# Patient Record
Sex: Male | Born: 1950 | Race: White | Hispanic: No | Smoking: Never smoker
Health system: Southern US, Community
[De-identification: ages and names within clinical notes are randomized; demographics above are authoritative.]

## PROBLEM LIST (undated history)

## (undated) DIAGNOSIS — M1990 Unspecified osteoarthritis, unspecified site: Secondary | ICD-10-CM

## (undated) DIAGNOSIS — F329 Major depressive disorder, single episode, unspecified: Secondary | ICD-10-CM

## (undated) DIAGNOSIS — R519 Headache, unspecified: Secondary | ICD-10-CM

## (undated) DIAGNOSIS — F419 Anxiety disorder, unspecified: Secondary | ICD-10-CM

## (undated) DIAGNOSIS — I2510 Atherosclerotic heart disease of native coronary artery without angina pectoris: Secondary | ICD-10-CM

## (undated) DIAGNOSIS — E7800 Pure hypercholesterolemia, unspecified: Secondary | ICD-10-CM

## (undated) DIAGNOSIS — I4510 Unspecified right bundle-branch block: Secondary | ICD-10-CM

## (undated) DIAGNOSIS — IMO0001 Reserved for inherently not codable concepts without codable children: Secondary | ICD-10-CM

## (undated) DIAGNOSIS — F32A Depression, unspecified: Secondary | ICD-10-CM

## (undated) DIAGNOSIS — R51 Headache: Secondary | ICD-10-CM

## (undated) DIAGNOSIS — I209 Angina pectoris, unspecified: Secondary | ICD-10-CM

## (undated) DIAGNOSIS — I10 Essential (primary) hypertension: Secondary | ICD-10-CM

## (undated) DIAGNOSIS — K219 Gastro-esophageal reflux disease without esophagitis: Secondary | ICD-10-CM

## (undated) DIAGNOSIS — Z87442 Personal history of urinary calculi: Secondary | ICD-10-CM

## (undated) HISTORY — DX: Pure hypercholesterolemia, unspecified: E78.00

## (undated) HISTORY — DX: Atherosclerotic heart disease of native coronary artery without angina pectoris: I25.10

## (undated) HISTORY — DX: Essential (primary) hypertension: I10

## (undated) HISTORY — PX: TOE SURGERY: SHX1073

## (undated) HISTORY — PX: CATARACT EXTRACTION, BILATERAL: SHX1313

## (undated) HISTORY — PX: LEG SURGERY: SHX1003

---

## 1985-11-21 HISTORY — PX: GASTRIC RESTRICTION SURGERY: SHX653

## 1985-11-21 HISTORY — PX: CHOLECYSTECTOMY: SHX55

## 1995-11-22 HISTORY — PX: BILE DUCT EXPLORATION: SHX1225

## 1996-11-21 DIAGNOSIS — I209 Angina pectoris, unspecified: Secondary | ICD-10-CM

## 1996-11-21 HISTORY — PX: CORONARY ARTERY BYPASS GRAFT: SHX141

## 1996-11-21 HISTORY — DX: Angina pectoris, unspecified: I20.9

## 1996-11-21 HISTORY — PX: CARDIAC CATHETERIZATION: SHX172

## 2004-04-14 ENCOUNTER — Ambulatory Visit (HOSPITAL_COMMUNITY): Admission: RE | Admit: 2004-04-14 | Discharge: 2004-04-14 | Payer: Self-pay | Admitting: Surgery

## 2004-06-03 ENCOUNTER — Encounter (INDEPENDENT_AMBULATORY_CARE_PROVIDER_SITE_OTHER): Payer: Self-pay | Admitting: Specialist

## 2004-06-03 ENCOUNTER — Ambulatory Visit (HOSPITAL_COMMUNITY): Admission: RE | Admit: 2004-06-03 | Discharge: 2004-06-03 | Payer: Self-pay | Admitting: Surgery

## 2004-07-29 ENCOUNTER — Encounter: Admission: RE | Admit: 2004-07-29 | Discharge: 2004-10-27 | Payer: Self-pay | Admitting: Surgery

## 2004-11-05 ENCOUNTER — Encounter: Admission: RE | Admit: 2004-11-05 | Discharge: 2005-02-03 | Payer: Self-pay | Admitting: Surgery

## 2004-11-21 HISTORY — PX: BARIATRIC SURGERY: SHX1103

## 2005-02-10 ENCOUNTER — Encounter: Admission: RE | Admit: 2005-02-10 | Discharge: 2005-05-11 | Payer: Self-pay | Admitting: Surgery

## 2005-02-22 ENCOUNTER — Inpatient Hospital Stay (HOSPITAL_COMMUNITY): Admission: RE | Admit: 2005-02-22 | Discharge: 2005-02-28 | Payer: Self-pay | Admitting: Surgery

## 2005-05-17 ENCOUNTER — Encounter: Admission: RE | Admit: 2005-05-17 | Discharge: 2005-08-15 | Payer: Self-pay | Admitting: Surgery

## 2005-08-16 ENCOUNTER — Encounter: Admission: RE | Admit: 2005-08-16 | Discharge: 2005-11-14 | Payer: Self-pay | Admitting: Surgery

## 2005-11-21 HISTORY — PX: HERNIA REPAIR: SHX51

## 2006-02-22 ENCOUNTER — Encounter: Admission: RE | Admit: 2006-02-22 | Discharge: 2006-02-22 | Payer: Self-pay | Admitting: Surgery

## 2007-08-13 ENCOUNTER — Ambulatory Visit: Payer: Self-pay | Admitting: Cardiovascular Disease

## 2007-08-21 ENCOUNTER — Ambulatory Visit: Payer: Self-pay

## 2007-09-11 ENCOUNTER — Ambulatory Visit (HOSPITAL_COMMUNITY): Admission: RE | Admit: 2007-09-11 | Discharge: 2007-09-13 | Payer: Self-pay | Admitting: Surgery

## 2009-03-06 DIAGNOSIS — I498 Other specified cardiac arrhythmias: Secondary | ICD-10-CM | POA: Insufficient documentation

## 2009-03-06 DIAGNOSIS — I10 Essential (primary) hypertension: Secondary | ICD-10-CM | POA: Insufficient documentation

## 2009-03-06 DIAGNOSIS — E7800 Pure hypercholesterolemia, unspecified: Secondary | ICD-10-CM | POA: Insufficient documentation

## 2009-03-06 DIAGNOSIS — I2510 Atherosclerotic heart disease of native coronary artery without angina pectoris: Secondary | ICD-10-CM | POA: Insufficient documentation

## 2009-03-06 DIAGNOSIS — I4510 Unspecified right bundle-branch block: Secondary | ICD-10-CM | POA: Insufficient documentation

## 2009-03-09 ENCOUNTER — Encounter: Payer: Self-pay | Admitting: Cardiovascular Disease

## 2009-03-09 ENCOUNTER — Ambulatory Visit: Payer: Self-pay | Admitting: Cardiovascular Disease

## 2009-03-09 DIAGNOSIS — R609 Edema, unspecified: Secondary | ICD-10-CM | POA: Insufficient documentation

## 2009-09-18 ENCOUNTER — Ambulatory Visit: Payer: Self-pay | Admitting: Cardiovascular Disease

## 2009-10-12 ENCOUNTER — Encounter: Payer: Self-pay | Admitting: Cardiovascular Disease

## 2010-01-21 ENCOUNTER — Encounter (INDEPENDENT_AMBULATORY_CARE_PROVIDER_SITE_OTHER): Payer: Self-pay | Admitting: *Deleted

## 2010-02-19 ENCOUNTER — Ambulatory Visit: Payer: Self-pay | Admitting: Cardiovascular Disease

## 2010-09-03 ENCOUNTER — Ambulatory Visit: Payer: Self-pay | Admitting: Cardiovascular Disease

## 2010-11-19 ENCOUNTER — Telehealth: Payer: Self-pay | Admitting: Cardiovascular Disease

## 2010-11-21 HISTORY — PX: TOTAL KNEE ARTHROPLASTY: SHX125

## 2010-11-23 ENCOUNTER — Encounter: Payer: Self-pay | Admitting: Cardiovascular Disease

## 2010-12-06 ENCOUNTER — Inpatient Hospital Stay (HOSPITAL_COMMUNITY)
Admission: RE | Admit: 2010-12-06 | Discharge: 2010-12-08 | Payer: Self-pay | Source: Home / Self Care | Attending: Orthopedic Surgery | Admitting: Orthopedic Surgery

## 2010-12-06 LAB — CBC
HCT: 39.5 % (ref 39.0–52.0)
Hemoglobin: 12.6 g/dL — ABNORMAL LOW (ref 13.0–17.0)
MCH: 31 pg (ref 26.0–34.0)
MCHC: 31.9 g/dL (ref 30.0–36.0)
MCV: 97.1 fL (ref 78.0–100.0)
Platelets: 287 10*3/uL (ref 150–400)
RBC: 4.07 MIL/uL — ABNORMAL LOW (ref 4.22–5.81)
RDW: 12.4 % (ref 11.5–15.5)
WBC: 7.1 10*3/uL (ref 4.0–10.5)

## 2010-12-06 LAB — URINALYSIS, ROUTINE W REFLEX MICROSCOPIC
Hgb urine dipstick: NEGATIVE
Nitrite: NEGATIVE
Protein, ur: NEGATIVE mg/dL
Specific Gravity, Urine: 1.024 (ref 1.005–1.030)
Urine Glucose, Fasting: NEGATIVE mg/dL
Urobilinogen, UA: 1 mg/dL (ref 0.0–1.0)
pH: 5.5 (ref 5.0–8.0)

## 2010-12-06 LAB — BASIC METABOLIC PANEL
BUN: 15 mg/dL (ref 6–23)
CO2: 27 mEq/L (ref 19–32)
Calcium: 9.8 mg/dL (ref 8.4–10.5)
Chloride: 105 mEq/L (ref 96–112)
Creatinine, Ser: 0.89 mg/dL (ref 0.4–1.5)
GFR calc Af Amer: >60 mL/min (ref >60)
GFR calc non Af Amer: >60 mL/min (ref >60)
Glucose, Bld: 106 mg/dL — ABNORMAL HIGH (ref 70–99)
Potassium: 4.2 mEq/L (ref 3.5–5.1)
Sodium: 142 mEq/L (ref 135–145)

## 2010-12-06 LAB — SURGICAL PCR SCREEN
MRSA, PCR: NEGATIVE
Staphylococcus aureus: POSITIVE — AB

## 2010-12-06 LAB — DIFFERENTIAL
Basophils Absolute: 0 10*3/uL (ref 0.0–0.1)
Basophils Relative: 0 % (ref 0–1)
Eosinophils Absolute: 0.1 10*3/uL (ref 0.0–0.7)
Eosinophils Relative: 1 % (ref 0–5)
Lymphocytes Relative: 18 % (ref 12–46)
Lymphs Abs: 1.3 10*3/uL (ref 0.7–4.0)
Monocytes Absolute: 0.5 10*3/uL (ref 0.1–1.0)
Monocytes Relative: 7 % (ref 3–12)
Neutro Abs: 5.2 10*3/uL (ref 1.7–7.7)
Neutrophils Relative %: 73 % (ref 43–77)

## 2010-12-06 LAB — APTT: aPTT: 35 seconds (ref 24–37)

## 2010-12-06 LAB — PROTIME-INR
INR: 1.1 (ref 0.00–1.49)
Prothrombin Time: 14.4 seconds (ref 11.6–15.2)

## 2010-12-08 LAB — TYPE AND SCREEN
ABO/RH(D): A POS
Antibody Screen: NEGATIVE

## 2010-12-08 LAB — BASIC METABOLIC PANEL
BUN: 11 mg/dL (ref 6–23)
CO2: 30 mEq/L (ref 19–32)
Calcium: 8.4 mg/dL (ref 8.4–10.5)
Chloride: 102 mEq/L (ref 96–112)
Creatinine, Ser: 0.7 mg/dL (ref 0.4–1.5)
GFR calc Af Amer: >60 mL/min (ref >60)
GFR calc non Af Amer: >60 mL/min (ref >60)
Glucose, Bld: 118 mg/dL — ABNORMAL HIGH (ref 70–99)
Potassium: 4.3 mEq/L (ref 3.5–5.1)
Sodium: 137 mEq/L (ref 135–145)

## 2010-12-08 LAB — CBC
HCT: 31.3 % — ABNORMAL LOW (ref 39.0–52.0)
Hemoglobin: 9.8 g/dL — ABNORMAL LOW (ref 13.0–17.0)
MCH: 30.5 pg (ref 26.0–34.0)
MCHC: 31.3 g/dL (ref 30.0–36.0)
MCV: 97.5 fL (ref 78.0–100.0)
Platelets: 221 10*3/uL (ref 150–400)
RBC: 3.21 MIL/uL — ABNORMAL LOW (ref 4.22–5.81)
RDW: 12.4 % (ref 11.5–15.5)
WBC: 7.2 10*3/uL (ref 4.0–10.5)

## 2010-12-08 LAB — ABO/RH: ABO/RH(D): A POS

## 2010-12-13 LAB — CBC
HCT: 30.9 % — ABNORMAL LOW (ref 39.0–52.0)
Hemoglobin: 10.1 g/dL — ABNORMAL LOW (ref 13.0–17.0)
MCH: 31 pg (ref 26.0–34.0)
MCHC: 32.7 g/dL (ref 30.0–36.0)
MCV: 94.8 fL (ref 78.0–100.0)
Platelets: 224 10*3/uL (ref 150–400)
RBC: 3.26 MIL/uL — ABNORMAL LOW (ref 4.22–5.81)
RDW: 12.1 % (ref 11.5–15.5)
WBC: 10.3 10*3/uL (ref 4.0–10.5)

## 2010-12-13 LAB — BASIC METABOLIC PANEL
BUN: 12 mg/dL (ref 6–23)
CO2: 28 mEq/L (ref 19–32)
Calcium: 8.4 mg/dL (ref 8.4–10.5)
Chloride: 103 mEq/L (ref 96–112)
Creatinine, Ser: 0.81 mg/dL (ref 0.4–1.5)
GFR calc Af Amer: >60 mL/min (ref >60)
GFR calc non Af Amer: >60 mL/min (ref >60)
Glucose, Bld: 118 mg/dL — ABNORMAL HIGH (ref 70–99)
Potassium: 4.4 mEq/L (ref 3.5–5.1)
Sodium: 136 mEq/L (ref 135–145)
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
## 2010-12-21 NOTE — Letter (Signed)
Summary: Appointment - Reminder 2  [employer], Main Office  [address]   [place], [place] [address]   Phone: [phone]  Fax: [phone]     January 21, 2010 MRN: [id number]   [facility] [address] [place], [place]  [address]   Dear Mr. Bhandari,  Our records indicate that it is time to schedule a follow-up appointment with Dr. Eden Emms. It is very important that we reach you to schedule this appointment. We look forward to participating in your health care needs. Please contact us at the number listed above at your earliest convenience to schedule your appointment.  If you are unable to make an appointment at this time, give [place] a call so we can update our records.     Sincerely,   [facility] D[facility] Scheduling Team

## 2010-12-21 NOTE — Assessment & Plan Note (Signed)
Summary: F6M/DM   History of Present Illness: Jeremiah [name] is seen today for f/U CAD with CABG by Dr. Laneta Simmers in 1998  He had a Lima LAD, SVG D1, SVG OM1 and OM2, SVG PDA/PLA and distal circ.  EF is normal  He is also post gastric bypass surgery.  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  He is not having any SSCP, dyspnea or palpitations.  He has mild chronic edema.  He has been compliant with his meds and has an LDL under 100.  The cooler weather is bothering his arthritis but other than that he is doing well.   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 Take 1 By Mouth Two Times A Day 2)  Labetalol Hcl 200 Mg Tabs (Labetalol Hcl) .... Take 1 By Mouth Two Times A Day 3)  Simvastatin 40 Mg Tabs (Simvastatin) .... Take 1 By Mouth Once Daily 4)  Lisinopril 10 Mg Tabs (Lisinopril) .... Take 1 By Mouth Once Daily 5)  Fluoxetine Hcl 40 Mg Caps (Fluoxetine Hcl) .... Take 1 By Mouth Once Daily 6)  Bayer Aspirin 325 Mg Tabs (Aspirin) .... Take 1 Once Daily 7)  Clonidine Hcl 0.1 Mg Tabs (Clonidine Hcl) .... Take 1 By Mouth Once Daily 8)  Zolpidem Tartrate 10 Mg Tabs (Zolpidem Tartrate) .... Take 1 By Mouth At Bedtime 9)  Multivitamins  Tabs (Multiple Vitamin) .... Take 1 Once Daily 10)  Ferrous Sulfate 325 (65 Fe) Mg  Tabs (Ferrous Sulfate) .... Once Daily 11)  Vitamin B-12 500 Mcg  Tabs (Cyanocobalamin) .... Once Daily 12)  Calcium Antacid Ultra Max St 1000 Mg Chew (Calcium Carbonate Antacid) .[name].. 11 Tab By Mouth Once Daily 13)  Meloxicam 15 Mg Tabs (Meloxicam) .[name].. 1  Tab Po Once Daily  Allergies (verified): No Known Drug Allergies  Past History:  Past Medical History: Last updated: 03/06/2009 Current Problems:  BUNDLE BRANCH BLOCK, RIGHT (ICD-426.4) BRADYCARDIA (ICD-427.89) CAD (ICD-414.00) HYPERTENSION (ICD-401.9) HYPERCHOLESTEROLEMIA (ICD-272.0)   orthopedic issues. gout Currently stable  Past Surgical History: Last updated: 03/06/2009 upper hernia and a laparoscopic internal repair of the entire area.  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 Criss Alvine. bile duct stone in 1997, left knee arthroscopy adhesive capsulitis of the left shoulder with surgery by Dr. Margaree Mackintosh in 2005. bariatric surgery   Family History: Last updated: 03/06/2009  Non-contributory.      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  Alcohol Use - no  Review of Systems       Denies fever, malais, weight loss, blurry vision, decreased visual acuity, cough, sputum, SOB, hemoptysis, pleuritic pain, palpitaitons, heartburn, abdominal pain, melena, lower extremity edema, claudication, or rash.   Vital Signs:  Patient profile:   60 year old male Height:      69 inches Weight:      265 pounds BMI:     39.28 Pulse rate:  58 / minute Resp:     18 per minute BP sitting:   114 / 60  (left arm)  Vitals Entered By: Marrion Coy, CNA (September 03, 2010 8:58 AM)  Physical Exam  General:  Affect appropriate Healthy:  appears stated age HEENT: normal Neck supple with no adenopathy JVP normal no bruits no thyromegaly Lungs clear with no wheezing and good diaphragmatic motion Heart:  S1/S2 no murmur,rub, gallop or click PMI normal Abdomen: benighn, BS positve, no tenderness, no AAA no bruit.  No HSM or HJR Distal pulses intact with no bruits No edema Neuro non-focal Skin warm and  dry    Impression & Recommendations:  Problem # 1:  BUNDLE BRANCH BLOCK, RIGHT (ICD-426.4) No evidence of wenkebach or below his block.  Consider lowering BB dose in future.  ECG q 6 months His updated medication list for this problem includes:    Labetalol Hcl 200 Mg Tabs (Labetalol hcl) .[name]... Take 1 by mouth two times a day    Lisinopril 10 Mg Tabs (Lisinopril) .[name]... Take 1 by mouth once daily    Bayer Aspirin 325 Mg Tabs (Aspirin) .[name]... Take 1 once daily  Problem # 2:  CAD (ICD-414.00) Stable no angina His updated medication list for this problem includes:    Labetalol Hcl 200 Mg Tabs (Labetalol hcl) .[name]... Take 1 by mouth two times a day    Lisinopril 10 Mg Tabs (Lisinopril) .[name]... Take 1 by mouth once daily    Bayer Aspirin 325 Mg Tabs (Aspirin) .[name]... Take 1 once daily  Orders: EKG w/ Interpretation (93000)  Problem # 3:  HYPERTENSION (ICD-401.9) Well controlled His updated medication list for this problem includes:    Labetalol Hcl 200 Mg Tabs (Labetalol hcl) .[name]... Take 1 by mouth two times a day    Lisinopril 10 Mg Tabs (Lisinopril) .[name]... Take 1 by mouth once daily    Bayer Aspirin 325 Mg Tabs (Aspirin) .[name]... Take 1 once daily    Clonidine Hcl 0.1 Mg Tabs (Clonidine hcl) .[name]... Take 1 by mouth once daily  Problem # 4:  HYPERCHOLESTEROLEMIA (ICD-272.0) Labs per primary next month.  Cotninue statin His updated medication list for this problem includes:    Simvastatin 40 Mg Tabs (Simvastatin) .[name]... Take 1 by mouth once daily  Patient Instructions: 1)  Your physician recommends that you schedule a follow-up appointment in: 6 MONTHS WITH DR Eden Emms 2)  Your physician recommends that you continue on your current medications as directed. Please refer to the Current Medication list given to you today.   EKG Report  Procedure date:  09/03/2010  Findings:      Sinus 58 PR 210 RBBB LAD Abnormla ECG

## 2010-12-21 NOTE — Assessment & Plan Note (Signed)
Summary: F6M/DM   CC:  no complaints.  History of Present Illness: Jeremiah [name] is seen today for f/U CAD with CABG by Dr. Laneta Simmers in 1998  He had a Lima LAD, SVG D1, SVG OM1 and OM2, SVG PDA/PLA and distal circ.  EF is normal  He is also post gastric bypass surgery.  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  He is not having any SSCP, dyspnea or palpitations.  He has mild chronic edema.  He has been compliant with his meds and has an LDL under 100 I reviewed labs from 11/10 at [facility] and he had normal LFT;s and LDL of 80.  The cooler weather is bothering his arthritis but other than that he is doing well.  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 Take 1 By Mouth Two Times A Day 2)  Labetalol Hcl 200 Mg Tabs (Labetalol Hcl) .... Take 1 By Mouth Two Times A Day 3)  Simvastatin 40 Mg Tabs (Simvastatin) .... Take 1 By Mouth Once Daily 4)  Lisinopril 10 Mg Tabs (Lisinopril) .... Take 1 By Mouth Once Daily 5)  Fluoxetine Hcl 40 Mg Caps (Fluoxetine Hcl) .... Take 1 By Mouth Once Daily 6)  Bayer Aspirin 325 Mg Tabs (Aspirin) .... Take 1 Once Daily 7)  Clonidine Hcl 0.1 Mg Tabs (Clonidine Hcl) .... Take 1 By Mouth Once Daily 8)  Zolpidem Tartrate 10 Mg Tabs (Zolpidem Tartrate) .... Take 1 By Mouth At Bedtime 9)  Multivitamins  Tabs (Multiple Vitamin) .... Take 1 Once Daily 10)  Glucosamine-Chondroitin   Caps (Glucosamine-Chondroit-Vit C-Mn) .... Two Times A Day 11)  Ferrous Sulfate 325 (65 Fe) Mg  Tabs (Ferrous Sulfate) .... Once Daily 12)  Vitamin B-12 500 Mcg  Tabs (Cyanocobalamin) .... Once Daily 13)  Aleve 220 Mg Tabs (Naproxen Sodium) .... 2 Two Times A Day 14)  Calcium Antacid Ultra Max St 1000 Mg Chew  (Calcium Carbonate Antacid) .[name].. 11 Tab By Mouth Once Daily 15)  Meloxicam 15 Mg Tabs (Meloxicam) .[name].. 1 Tab Po Once Daily  Allergies (verified): No Known Drug Allergies  Past History:  Past Medical History: Last updated: 03/06/2009 Current Problems:  BUNDLE BRANCH BLOCK, RIGHT (ICD-426.4) BRADYCARDIA (ICD-427.89) CAD (ICD-414.00) HYPERTENSION (ICD-401.9) HYPERCHOLESTEROLEMIA (ICD-272.0)   orthopedic issues. gout Currently stable  Past Surgical History: Last updated: 03/06/2009 upper hernia and a laparoscopic internal repair of the entire area.  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 Criss Alvine. bile duct stone in 1997, left knee arthroscopy adhesive capsulitis of the left shoulder with surgery by Dr. Margaree Mackintosh in 2005. bariatric surgery   Family History: Last updated: 03/06/2009  Non-contributory.      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  Alcohol Use - no  Review of Systems       Denies fever, malais, weight loss, blurry vision, decreased visual acuity, cough, sputum, SOB, hemoptysis, pleuritic pain, palpitaitons, heartburn, abdominal pain, melena, lower extremity edema, claudication, or rash.   Vital Signs:  Patient profile:   60 year old male Height:  69 inches Weight:      271 pounds BMI:     40.16 Pulse rate:   70 / minute Resp:     12 per minute BP sitting:   120 / 80  (left arm)  Vitals Entered By: Kem Parkinson (February 19, 2010 8:38 AM)  Physical Exam  General:  Affect appropriate Healthy:  appears stated age HEENT: normal Neck supple with no adenopathy JVP normal no bruits no thyromegaly Lungs clear with no wheezing and good diaphragmatic motion Heart:  S1/S2 no murmur,rub, gallop or click PMI normal Abdomen: benighn, BS positve, no tenderness, no AAA  S/P gastric stabling and gastric bypass surg no bruit.  No HSM or HJR Distal pulses intact with no bruits No edema Neuro non-focal Skin warm and dry    Impression & Recommendations:  Problem # 1:  CAD (ICD-414.00) Stable no angina.  Continue ASA and BB His updated medication list for this problem includes:    Labetalol Hcl 200 Mg Tabs (Labetalol hcl) .[name]... Take 1 by mouth two times a day    Lisinopril 10 Mg Tabs (Lisinopril) .[name]... Take 1 by mouth once daily    Bayer Aspirin 325 Mg Tabs (Aspirin) .[name]... Take 1 once daily  Problem # 2:  HYPERTENSION (ICD-401.9) Well contorlled continue low sodium diet His updated medication list for this problem includes:    Labetalol Hcl 200 Mg Tabs (Labetalol hcl) .[name]... Take 1 by mouth two times a day    Lisinopril 10 Mg Tabs (Lisinopril) .[name]... Take 1 by mouth once daily    Bayer Aspirin 325 Mg Tabs (Aspirin) .[name]... Take 1 once daily    Clonidine Hcl 0.1 Mg Tabs (Clonidine hcl) .[name]... Take 1 by mouth once daily  Problem # 3:  HYPERCHOLESTEROLEMIA (ICD-272.0) At goal with no side effects.  F/U labs [facility] His updated medication list for this problem includes:    Simvastatin 40 Mg Tabs (Simvastatin) .[name]... Take 1 by mouth once daily  Problem # 4:  BUNDLE BRANCH BLOCK, RIGHT (ICD-426.4) Baseline ECG RBBB stable with no evidence of high grade heart block His updated medication list for this problem includes:    Labetalol Hcl 200 Mg Tabs (Labetalol hcl) .[name]... Take 1 by mouth two times a day    Lisinopril 10 Mg Tabs (Lisinopril) .[name]... Take 1 by mouth once daily    Bayer Aspirin 325 Mg Tabs (Aspirin) .[name]... Take 1 once daily  Patient Instructions: 1)  Your physician recommends that you schedule a follow-up appointment in: 6 MONTHS

## 2010-12-23 NOTE — Progress Notes (Signed)
Summary: surgical clearance form  Phone Note Call from Patient Call back at Home Phone [phone]   Caller: Patient Reason for Call: Talk to Nurse Summary of Call: pt having knee surgery 12/06/09. pt states surgical clearnace form was fax. pt wants to know if debra can fill that out and fax it back for his surgery. Initial call taken by: Roe Coombs,  November 19, 2010 9:27 AM  Follow-up for Phone Call        Pt aware Dr. Eden Emms & Stanton Kidney are out of office at this time.  He would like surgical clearance faxed to Dr. Charlann Boxer at [facility].  He will be seeing Dr. Charlann Boxer on Friday 11/26/10.  I will forward this to Dr. Eden Emms and Veneda Melter RN     Appended Document: surgical clearance form clearence form faxed  Appended Document: surgical clearance form ok for surgery

## 2010-12-29 NOTE — Letter (Signed)
Summary: [facility] Surgical Clearance   [facility] Surgical Clearance   Imported By: Roderic Ovens 12/24/2010 12:32:21  _____________________________________________________________________  External Attachment:    Type:   Image     Comment:   External Document

## 2011-03-04 ENCOUNTER — Ambulatory Visit: Payer: Self-pay | Admitting: Cardiovascular Disease

## 2011-03-17 ENCOUNTER — Encounter: Payer: Self-pay | Admitting: Cardiovascular Disease

## 2011-03-18 ENCOUNTER — Encounter: Payer: Self-pay | Admitting: Cardiovascular Disease

## 2011-03-18 ENCOUNTER — Ambulatory Visit (INDEPENDENT_AMBULATORY_CARE_PROVIDER_SITE_OTHER): Payer: 59 | Admitting: Cardiovascular Disease

## 2011-03-18 DIAGNOSIS — Z136 Encounter for screening for cardiovascular disorders: Secondary | ICD-10-CM

## 2011-03-18 DIAGNOSIS — I2510 Atherosclerotic heart disease of native coronary artery without angina pectoris: Secondary | ICD-10-CM

## 2011-03-18 DIAGNOSIS — I714 Abdominal aortic aneurysm, without rupture, unspecified: Secondary | ICD-10-CM

## 2011-03-18 DIAGNOSIS — E7800 Pure hypercholesterolemia, unspecified: Secondary | ICD-10-CM

## 2011-03-18 DIAGNOSIS — I10 Essential (primary) hypertension: Secondary | ICD-10-CM

## 2011-03-18 DIAGNOSIS — I4510 Unspecified right bundle-branch block: Secondary | ICD-10-CM

## 2011-03-18 NOTE — Assessment & Plan Note (Signed)
Palpable abdominal aorta that appears enlarged.  F/U US/duplex
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
## 2011-03-18 NOTE — Assessment & Plan Note (Signed)
Well controlled.  Continue current medications and low sodium Dash type diet.    

## 2011-03-18 NOTE — Patient Instructions (Addendum)
1. Your physician recommends that you schedule a follow-up appointment in: 6 months with Dr. Eden Emms 2. Your physician has requested that you have an abdominal aorta duplex. During this test, an ultrasound is used to evaluate the aorta. Allow 30 minutes for this exam. Do not eat after midnight the day before and avoid carbonated beverages

## 2011-03-18 NOTE — Progress Notes (Signed)
Addended by: Lisabeth Devoid on: 03/18/2011 09:26 AM   Modules accepted: Orders

## 2011-03-18 NOTE — Assessment & Plan Note (Signed)
Stable with no angina and good activity level.  Continue medical Rx  

## 2011-03-18 NOTE — Assessment & Plan Note (Signed)
Cholesterol is at goal.  Continue current dose of statin and diet Rx.  No myalgias or side effects.  F/U  LFT's in 6 months. No results found for this basename: LDLCALC             

## 2011-03-18 NOTE — Assessment & Plan Note (Signed)
Continue low dose BB no syncope or AV block  ECG stable

## 2011-03-23 ENCOUNTER — Emergency Department (HOSPITAL_COMMUNITY)
Admission: EM | Admit: 2011-03-23 | Discharge: 2011-03-23 | Disposition: A | Discharge status: Home / Self Care | Payer: Worker's Compensation | Attending: Emergency Medicine | Admitting: Emergency Medicine

## 2011-03-23 DIAGNOSIS — Y92410 Unspecified street and highway as the place of occurrence of the external cause: Secondary | ICD-10-CM | POA: Insufficient documentation

## 2011-03-23 DIAGNOSIS — M25569 Pain in unspecified knee: Secondary | ICD-10-CM | POA: Insufficient documentation

## 2011-03-23 DIAGNOSIS — E7800 Pure hypercholesterolemia, unspecified: Secondary | ICD-10-CM | POA: Insufficient documentation

## 2011-03-23 DIAGNOSIS — M549 Dorsalgia, unspecified: Secondary | ICD-10-CM | POA: Insufficient documentation

## 2011-03-23 DIAGNOSIS — T1490XA Injury, unspecified, initial encounter: Secondary | ICD-10-CM | POA: Insufficient documentation

## 2011-03-23 DIAGNOSIS — Z9889 Other specified postprocedural states: Secondary | ICD-10-CM | POA: Insufficient documentation

## 2011-03-23 DIAGNOSIS — I10 Essential (primary) hypertension: Secondary | ICD-10-CM | POA: Insufficient documentation

## 2011-03-23 DIAGNOSIS — Z951 Presence of aortocoronary bypass graft: Secondary | ICD-10-CM | POA: Insufficient documentation

## 2011-03-24 ENCOUNTER — Other Ambulatory Visit: Payer: Self-pay | Admitting: Cardiology

## 2011-03-24 DIAGNOSIS — R0989 Other specified symptoms and signs involving the circulatory and respiratory systems: Secondary | ICD-10-CM

## 2011-03-25 ENCOUNTER — Ambulatory Visit: Payer: 59 | Admitting: Cardiology

## 2011-03-25 ENCOUNTER — Ambulatory Visit (INDEPENDENT_AMBULATORY_CARE_PROVIDER_SITE_OTHER): Payer: Self-pay | Admitting: Cardiology

## 2011-03-25 DIAGNOSIS — Z0000 Encounter for general adult medical examination without abnormal findings: Secondary | ICD-10-CM

## 2011-03-28 ENCOUNTER — Encounter: Payer: Self-pay | Admitting: Cardiovascular Disease

## 2011-04-05 NOTE — Op Note (Signed)
NAMEPRESTIN, Jeremiah [name]               ACCOUNT NO.:  [id number]   MEDICAL RECORD NO.:  [id number]          PATIENT TYPE:  AMB   LOCATION:  DAY                          FACILITY:  [facility]   PHYSICIAN:  Thornton Park. Daphine Deutscher, MD  DATE OF BIRTH:  Aug 04, 1951   DATE OF PROCEDURE:  09/11/2007  DATE OF DISCHARGE:                               OPERATIVE REPORT   PREOPERATIVE DIAGNOSIS:  Ventral incisional hernia.   POSTOPERATIVE DIAGNOSIS:  Incisional hernia and umbilical hernia with  incarcerated ventral hernia, sliding, small bowel partial obstruction.   PROCEDURE:  Laparoscopically-assisted repair of ventral incarcerated  incisional hernia with a totally laparoscopic repair of the entire  anterior abdominal wall including the umbilical hernia.   SURGEON:  Luretha Murphy, M.D.   ASSISTANT:  Ovidio Kin, M.D.   Mesh products include UltraPro mesh for the external repair of the  incarcerated upper abdominal ventral incisional hernia and Proceed mesh  for the 15 x 20 cm mesh placed totally laparoscopically.   DESCRIPTION OF PROCEDURE:  Jeremiah [name] was taken to room 11 on September 11, 2007 given general anesthesia.  The abdomen was prepped with [facility]-  Care and then an Ioban made off and drape was placed.  I entered through  the right upper quadrant using a 10 cm OptiVu [employer] without  difficulty and insufflated.  I found an incarcerated mid abdominal small  bowel stuck up into this little hernia that we could feel popping and I  had previously marked.  Total of three 5 mm trocars were placed and I  then attempted to take this down with a laparoscope.  I found the small  bowel was an edge of this and was actually a sliding hernia offering  partial small-bowel obstruction.  After working on this for while I then  made a longitudinal incision excising the skin, reducing the hernia and  then with a combination of laparoscopic and open I took down the hernia  sac and allowed the small  bowel to return into the abdomen.   I then repaired the defect transversely with interrupted #1 Novofils and  then tied down the Proceed mesh on the this from either side, having  closed this in a transverse fashion and I tacked it with four full  thickness Novofils of the same size and then anteriorly tacked it with  four more.  I irrigated.  At this point I was able to see the entire  abdominal wall better and now found several centimeters below an  umbilical hernia that was big enough to take a loop of bowel.  Feeling I  needed to repair that as well as some more portion of the lower incision  that was more of a Swiss cheese appearing, I then got a piece of the  aforementioned mesh, placed eight sutures in it, rolled it, put it in  the abdomen.  Through eight small incisions, I went in with a suture  passer and brought it up to the anterior abdominal wall and then tacked  it with multiple surgical tackers.  This was small bowel was not  involved in anything and everything looked real good.  It was all  irrigated.  The wounds were closed 4-0 Vicryl and with staples.  The patient tolerated procedure well.  This took approximately three  hours to do in its total duration.  The patient tolerated procedure  well, was taken to recovery room in satisfactory addition.  He will be  kept for overnight observation.      Thornton Park Daphine Deutscher, MD  Electronically Signed     MBM/MEDQ  D:  09/11/2007  T:  09/12/2007  Job:  [id number]   cc:   Dario Guardian, M.D.  Fax: [phone]

## 2011-04-05 NOTE — Discharge Summary (Signed)
NAMECONSTANTINO, Jeremiah [name]               ACCOUNT NO.:  [id number]   MEDICAL RECORD NO.:  [id number]          PATIENT TYPE:  INP   LOCATION:  1303                         FACILITY:  [facility]   PHYSICIAN:  Thornton Park. Daphine Deutscher, MD  DATE OF BIRTH:  [birth year]   DATE OF ADMISSION:  09/11/2007  DATE OF DISCHARGE:  09/13/2007                               DISCHARGE SUMMARY   PREOPERATIVE ADMISSION DIAGNOSIS:  Ventral incisional hernia at the site  of his previous open vertical banded gastroplasty done in the 1980s at  [facility].   POSTOPERATIVE DIAGNOSIS:  At least two ventral incisional hernias, one  in the upper midline containing a piece of incarcerated small bowel and  a second below the umbilicus, also within the wound.   PROCEDURE:  Extra peritoneal onlay of Proceed mesh over primary closure  of the upper hernia and a laparoscopic internal repair of the entire  area.   HOSPITAL COURSE:  Mr. Dino underwent the above-mentioned operation on  September 11, 2007.  Postoperatively, on day #1, he was not ready to be  discharged, as he had pain control issues.  He was not able to eat, and  he had a little bit of difficulty voiding.  He was kept on postop day  #2, when he was eating and voiding.  He was given some Percocet to take  for pain.  He was discharged to be followed up in the office in about  five days for his staple removal by Clementeen Hoof, my nurse, and  subsequently by me in about four weeks.   CONDITION:  Good.      Thornton Park Daphine Deutscher, MD  Electronically Signed     MBM/MEDQ  D:  09/13/2007  T:  09/14/2007  Job:  [id number]   cc:   Dario Guardian, M.D.  Fax: [phone]
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
## 2011-04-08 NOTE — Op Note (Signed)
Jeremiah [name], Jeremiah [name]               ACCOUNT NO.:  [id number]   MEDICAL RECORD NO.:  [id number]          PATIENT TYPE:  INP   LOCATION:  0001                         FACILITY:  [facility]   PHYSICIAN:  Thornton Park. Daphine Deutscher, MD  DATE OF BIRTH:  [birth year]   DATE OF PROCEDURE:  02/22/2005  DATE OF DISCHARGE:                                 OPERATIVE REPORT   PREOPERATIVE DIAGNOSIS:  Prior vertical banded gastroplasty by Dr. Elnora Morrison in 1986.   PROCEDURE:  Laparoscopic conversion of vertical banded gastroplasty to  laparoscopic Roux-en-Y gastric bypass and ventral hernia repair.   SURGEON:  Thornton Park. Daphine Deutscher, MD.   ASSISTANT:  Vikki Ports, M.D.   ANESTHESIA:  General endotracheal.   OPERATIVE TIME:  5 hours 15 minutes.   DRAINS:  None.   ESTIMATED BLOOD LOSS:  100 cc.   DESCRIPTION OF PROCEDURE:  Jeremiah [name] is a 60 year old man who at age 5  underwent a vertical banded gastroplasty in [place] by Dr. Carmelina Paddock and Dr. Elnora Morrison. He initially lost weight very well but  within 2 years had an episode of vomiting and at that time, I think,  disrupted his staple line and he regained his weight. He has since then had  some problems with cardiac requiring cardiac bypass and he currently has a  BMI of 56 with a weight of 377.   Informed consent was obtained regarding the probability of possibly having  to convert this to an open procedure. We would try to do this  laparoscopically.   The patient was taken back to room 1 on February 22, 2005, and given general  anesthesia. The abdomen was prepped widely with Betadine and draped  sterilely. Preoperatively he received 5000 units of heparin subcu and  antibiotic. I entered the abdomen through the left upper quadrant with the  OptiVu 12 mm trocar, insufflated the abdomen, found the omental apron to be  stuck to the anterior abdominal wall, particularly in the midline from his  previous open VBG. I placed trocars  around this and then carefully began a  dissection to take the omentum down and thereby exposing umbilical hernia  which had incarcerated omentum within it and I took this out as well. I took  down the transverse colon which was attached to the anterior abdominal wall  and got the omentum down.   First order of business was to expose the foregut and I went ahead and began  the tedious dissection, taking down the liver from omentum which had been  wedged between the liver and the Marlex mesh used for the VBG. I spent about  an hour and a half doing the proximal foregut dissection but in the end got  this markedly enlarged to the lateral segment which was stuck to the spleen,  detached from the spleen, and the foregut including the esophagogastric  junction delineated as well as I could see where the sutures were for the  Marlex mesh.   I then placed a Nathanson retractor and elected to go ahead and create the  pouch first. This was  done using multiple firings of the echelon stapler by  [employer], first using a bronze load along the magenstrasse and then three  subsequent firings to complete the pouch. The pouch and the gastric remnant  appeared viable. I also passed a lighted bougie at one point to help  facilitate in the dissection of the anatomy prior to creating the pouch and  then we also used and Ewald too. The pouch had been divided by placing  Tisseel up on the proximal portion of the staple lines.   Next we went below and identified the ligament of Treitz. I went 40 cm  distal to the ligament of Treitz and divided the small intestine with the  echelon stapler and then I placed a Penrose drain on the Roux end of that. I  then counted 1 meter of the Roux limb and then aligned the biliopancreatic  limb to the Roux and tacked these with a suture before creating common  openings and eventually a common channel with the echelon stapler. The  common defect was closed with a running 2-0  Vicryl using the Endo stitch. I  then closed the mesenteric defect with a running 2-0 silk and did get a  little bleeder down at the bottom but controlled that with two locking  sutures.   Next I placed an anti-obstruction stitch between the distal port of the Roux  limb and the at biliopancreatic limb.   This was then brought cephalad and sutured with a running suture along the  staple line of the gastric pouch. The candy cane pointed to the patient's  left.  A running suture aligned these nicely and this was noted to be done  without much tension. I then created common openings on either side and  introduced the echelon and fired this. The common defect was closed in two  layers,  first with 2-0 Vicryl and then the entire anterior anastomosis was  closed with running 2-0 Vicryl using an SH needle and laparoscopic drivers.  Tisseel was applied to the anterior layer.   Where I had pulled out incarcerated omentum, I had a hernia to deal with at  the umbilicus. I elected to visualize that with the laparoscope while I cut  down on it and closed it with five simple sutures of #1 Novofil. I  reinspected it with the scope and this appeared to be enclosed.   There was no evidence of any enterotomies. Dr. Luan Pulling inserted the  endoscope at the end of the completion of the gastrojejunostomy and  insufflated under pressure. There was no evidence of leak. The pouch was  visualized and measured.   The remaining portion of the abdomen was surveyed and appeared to be in  order. Operative time was 5 hours 15 minutes and the abdomen was then  deflated and wounds were closed with the stapler. The patient seemed to  tolerate the procedure well and was taken to recovery room prior to transfer  to the intensive care unit.      MBM/MEDQ  D:  02/22/2005  T:  02/22/2005  Job:  [id number]   cc:   Dario Guardian, M.D. [address]  [place]  [place] [address]  Fax: [phone]   Charlton Haws, M.D.

## 2011-04-08 NOTE — Op Note (Signed)
Jeremiah [name], Jeremiah [name]               ACCOUNT NO.:  [id number]   MEDICAL RECORD NO.:  [id number]          PATIENT TYPE:  INP   LOCATION:  0001                         FACILITY[facility]  [facility]   PHYSICIAN:  Vikki Ports, MDDATE OF BIRTH:  [birth year]   DATE OF PROCEDURE:  02/22/2005  DATE OF DISCHARGE:                                 OPERATIVE REPORT   PREOPERATIVE DIAGNOSIS:  Status post take-down of vertical-banded  gastroplasty and conversion to Roux-en-Y gastric bypass.   PROCEDURE:  Upper endoscopy.   SURGEON:  Vikki Ports, MD   DESCRIPTION:  At the completion of Roux-en-Y gastric bypass, conversion from  BBG, the Olympus endoscope was introduced transorally down to approximately  38 cm, where the Z line was visualized.  The pouch was entered.  There was  no evidence of leak.  There was obvious good size to the anastomosis.  No  evidence of obstruction.  The pouch measured 4 cm.  It was then suctioned  out, and the scope was removed.  The patient tolerated the procedure well.      KRH/MEDQ  D:  02/22/2005  T:  02/22/2005  Job:  [id number]   cc:   Thornton Park Daphine Deutscher, MD  [address], [address]  [place]  [place] [address]

## 2011-04-08 NOTE — Discharge Summary (Signed)
[name], [name]               ACCOUNT NO.:  [id number]   MEDICAL RECORD NO.:  [id number]          PATIENT TYPE:  INP   LOCATION:  0467                         FACILITY:  [facility]   PHYSICIAN:  Thornton Park. Daphine Deutscher, MD  DATE OF BIRTH:  [birth year]   DATE OF ADMISSION:  02/22/2005  DATE OF DISCHARGE:  02/28/2005                                 DISCHARGE SUMMARY   ADMITTING DIAGNOSIS:  __________ in 1986 that failed, now for laparoscopic  revision.   PROCEDURE:  Laparoscopic takedown of vertical banded gastroplasty and  conversion to laparoscopic Roux-en-Y gastric bypass with a ventral hernia  repair on February 22, 2005.   COURSE IN THE HOSPITAL:  Jeremiah [name] is a 60 year old man who underwent  the above-mentioned operation as an outpatient.  Postoperatively, he had a  venous Doppler study on day one and an upper GI which showed a patent pouch  that emptied nicely and no evidence of any leaks.  He got along great on  postop day one.  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  He had his Doppler ultrasounds, which  were negative.  I kept him in the unit and watched him.  His vitals remained  stable, and after treatment first with colchicine and later with allopurinol  his gout seemed to improve.  He improved and was ready for discharge on  February 28, 2005.  At that time, he was sent out with allopurinol 200 mg one  daily, and also with Roxicet elixir as needed for pain.  Staples were in,  and he is scheduled to return to the office in three days for followup.   CONDITION:  Improved.   He was given extensive dietary and discharge instructions by our nurse, Ms.  Mercy Riding.   FINAL DIAGNOSIS:  Laparoscopic Roux-en-Y gastric bypass.      MBM/MEDQ  D:  02/28/2005  T:  02/28/2005  Job:  [id number]   cc:   [facility]   Dario Guardian, M.D.  [address]  [place]  [place] [address]  Fax: [phone]   Fatima Blank, M.D.  [facility]

## 2011-04-08 NOTE — Op Note (Signed)
NAME:  Jeremiah Jeremiah [name], Jeremiah Jeremiah [name]                         ACCOUNT NO.:  [id number]   MEDICAL RECORD NO.:  [id number]                   PATIENT TYPE:  AMB   LOCATION:  ENDO                                 FACILITY:  [facility]   PHYSICIAN:  Sandria Bales. Ezzard Standing, M.D.               DATE OF BIRTH:  Sep 13, 1951   DATE OF PROCEDURE:  06/03/2004  DATE OF DISCHARGE:                                 OPERATIVE REPORT   PREOPERATIVE DIAGNOSIS:  Morbid obesity with Jeremiah [name] height of 5 feet 9 inches and  weight of 434 pounds with history of prior vertical banded gastroplasty.   POSTOPERATIVE DIAGNOSIS:  1. Jeremiah [name] 2 cm hiatal hernia with maybe 1-2 mm area of Barrett's.  2. Failed vertical banded gastroplasty with breakdown of gastric staple     line.  3. Distal gastritis __________ bile reflux and normal duodenum.   OPERATION/PROCEDURE:  Esophagogastroduodenoscopy with biopsies of distal  stomach for both pathology and for CLO-test.   SURGEON:  Sandria Bales. Ezzard Standing, M.D.   ANESTHESIA:  6 mg of Versed, 50 mg Demerol.   COMPLICATIONS:  None.   INDICATIONS:  Jeremiah Jeremiah [name] is Jeremiah [name] 60 year old white male who in 1986 had Jeremiah [name]  vertical banded gastroplasty and which he successfully lost down to  approximately 230 pounds, but now his weight has ballooned back up to about  430 pounds.  His height is about 5 feet 9 inches and he undergoes an upper  endoscopy trying to document his anatomy.   DESCRIPTION OF PROCEDURE:  The back of the throat was anesthetized with  Cetacaine.  He was given 6 mg of Versed, 50 mg of Demerol and Jeremiah [name] flexible  Olympus endoscope was passed into the back of the throat into Jeremiah [name] stomach  pouch without any difficulty.   I was able to advance the scope into the duodenum.  The duodenum looked  normal.  Upon entering the stomach, he appeared to have some bile into his  stomach and appeared to have some evidence of bile reflux and his distal  gastric mucosa is flat.  It looks like it has chronic gastritis.  Jeremiah [name] biopsied  this  area, both for CLO-test and for pathology.  I then retroflexed the  scope.  I could see the tunnel created by vertical banded gastroplasty and  took photos of this.  These photos will go to our office.  I then withdrew  back up into the channel of the vertical banded gastroplasty and this shows  some irritation.  However, at the very proximal end of this, there was Jeremiah [name]  second hole along the greater curvature that allowed the scope to go  through, so this was Jeremiah [name] major failure of the staple line.   He did have Jeremiah [name] small hiatal hernia with evidence of 1-2 mm of Barrett's  esophagitis.  I took photos of this.  I did not biopsy this and figure that  he will need  followup endoscopy after his surgical procedure to document how  this all does.   Dr. Daphine Deutscher was there at the time of the procedure.  So he has Jeremiah [name] major  opening in the gastric staple line of is prior banded gastroplasty which is  probably the reason for is failure in weight control.  His esophagus  otherwise was unremarkable.  Scope was withdrawn.  Final pathology is  pending at the time of this dictation.  He has an appointment to see Dr.  Daphine Deutscher back in one week to discuss further options.                                               Sandria Bales. Ezzard Standing, M.D.    DHN/MEDQ  D:  06/03/2004  T:  06/03/2004  Job:  [id number]   cc:   Dario Guardian, M.D.  [address]  [place]  [place] [address]  Fax: [phone]

## 2011-06-20 ENCOUNTER — Ambulatory Visit: Payer: 59 | Admitting: Internal Medicine

## 2011-08-22 ENCOUNTER — Telehealth: Payer: Self-pay | Admitting: Cardiovascular Disease

## 2011-08-22 NOTE — Telephone Encounter (Signed)
Spoke with pt wife, they could not remember if the testing for AAA had been completed or not. Aware testing complete and normal Jeremiah [name]

## 2011-08-22 NOTE — Telephone Encounter (Signed)
Pt wants to know if he still needs the abdominal aorta test that was canceled for [id number] please call

## 2011-08-31 LAB — BASIC METABOLIC PANEL
CO2: 30
CO2: 31
Calcium: 8.8
Calcium: 9.6
Chloride: 103
Creatinine, Ser: 0.85
GFR calc Af Amer: >60
Glucose, Bld: 109 — ABNORMAL HIGH
Potassium: 5
Sodium: 140

## 2011-08-31 LAB — CBC
Hemoglobin: 12.7 — ABNORMAL LOW
MCV: 92.4
RBC: 4.04 — ABNORMAL LOW
WBC: 5.5

## 2011-08-31 LAB — HEMOGLOBIN AND HEMATOCRIT, BLOOD: HCT: 39.3

## 2012-07-06 ENCOUNTER — Ambulatory Visit (INDEPENDENT_AMBULATORY_CARE_PROVIDER_SITE_OTHER): Payer: 59 | Admitting: Cardiovascular Disease

## 2012-07-06 ENCOUNTER — Encounter: Payer: Self-pay | Admitting: Cardiovascular Disease

## 2012-07-06 VITALS — BP 124/78 | HR 53 | Resp 16 | Ht 69.0 in | Wt 252.1 lb

## 2012-07-06 DIAGNOSIS — E7800 Pure hypercholesterolemia, unspecified: Secondary | ICD-10-CM

## 2012-07-06 DIAGNOSIS — I4510 Unspecified right bundle-branch block: Secondary | ICD-10-CM

## 2012-07-06 DIAGNOSIS — I2510 Atherosclerotic heart disease of native coronary artery without angina pectoris: Secondary | ICD-10-CM

## 2012-07-06 DIAGNOSIS — I10 Essential (primary) hypertension: Secondary | ICD-10-CM

## 2012-07-06 NOTE — Patient Instructions (Signed)
Your physician wants you to follow-up in: YEAR WITH DR NISHAN  You will receive a reminder letter in the mail two months in advance. If you don't receive a letter, please call our office to schedule the follow-up appointment.  Your physician recommends that you continue on your current medications as directed. Please refer to the Current Medication list given to you today. 
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
## 2012-07-06 NOTE — Assessment & Plan Note (Signed)
ECG stable no symptoms or presyncope  Yearly ECG

## 2012-07-06 NOTE — Assessment & Plan Note (Signed)
Cholesterol is at goal.  Continue current dose of statin and diet Rx.  No myalgias or side effects.  F/U  LFT's in 6 months. No results found for this basename: LDLCALC             

## 2012-07-06 NOTE — Assessment & Plan Note (Signed)
Stable with no angina and good activity level.  Continue medical Rx  

## 2012-07-06 NOTE — Assessment & Plan Note (Signed)
Well controlled.  Continue current medications and low sodium Dash type diet.    

## 2013-07-09 ENCOUNTER — Encounter (HOSPITAL_COMMUNITY): Payer: Self-pay | Admitting: Pharmacy Technician

## 2013-07-12 ENCOUNTER — Ambulatory Visit (INDEPENDENT_AMBULATORY_CARE_PROVIDER_SITE_OTHER): Payer: 59 | Admitting: Cardiovascular Disease

## 2013-07-12 ENCOUNTER — Encounter: Payer: Self-pay | Admitting: Cardiovascular Disease

## 2013-07-12 VITALS — BP 114/70 | HR 67 | Ht 65.0 in | Wt 252.8 lb

## 2013-07-12 DIAGNOSIS — I4510 Unspecified right bundle-branch block: Secondary | ICD-10-CM

## 2013-07-12 DIAGNOSIS — E7800 Pure hypercholesterolemia, unspecified: Secondary | ICD-10-CM

## 2013-07-12 DIAGNOSIS — I2510 Atherosclerotic heart disease of native coronary artery without angina pectoris: Secondary | ICD-10-CM

## 2013-07-12 DIAGNOSIS — I10 Essential (primary) hypertension: Secondary | ICD-10-CM

## 2013-07-12 DIAGNOSIS — Z01818 Encounter for other preprocedural examination: Secondary | ICD-10-CM

## 2013-07-12 NOTE — Assessment & Plan Note (Signed)
Well controlled.  Continue current medications and low sodium Dash type diet.    

## 2013-07-12 NOTE — Patient Instructions (Addendum)
Your physician wants you to follow-up in:  YEAR WITH  DR NISHAN  You will receive a reminder letter in the mail two months in advance. If you don't receive a letter, please call our office to schedule the follow-up appointment. Your physician recommends that you continue on your current medications as directed. Please refer to the Current Medication list given to you today.  Your physician has requested that you have a lexiscan myoview. For further information please visit [email or web address]. Please follow instruction sheet, as given.  

## 2013-07-12 NOTE — Assessment & Plan Note (Signed)
Avoid AV nodal blocker drugs no symptomatic bradycardia Stable trifasicular block

## 2013-07-12 NOTE — Assessment & Plan Note (Signed)
Cholesterol is at goal.  Continue current dose of statin and diet Rx.  No myalgias or side effects.  F/U  LFT's in 6 months. No results found for this basename: LDLCALC             

## 2013-07-12 NOTE — Assessment & Plan Note (Signed)
No chest pain but sedentary due to knee arthritis. Preopo eval will need lexiscan myovue to clear for surgery given old grafts.
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
## 2013-07-15 ENCOUNTER — Telehealth: Payer: Self-pay | Admitting: Cardiovascular Disease

## 2013-07-15 ENCOUNTER — Ambulatory Visit (HOSPITAL_COMMUNITY): Payer: 59 | Attending: Internal Medicine | Admitting: Radiology

## 2013-07-15 VITALS — BP 110/55 | Ht 65.0 in | Wt 250.0 lb

## 2013-07-15 DIAGNOSIS — I2510 Atherosclerotic heart disease of native coronary artery without angina pectoris: Secondary | ICD-10-CM

## 2013-07-15 DIAGNOSIS — Z01810 Encounter for preprocedural cardiovascular examination: Secondary | ICD-10-CM | POA: Insufficient documentation

## 2013-07-15 DIAGNOSIS — Z01818 Encounter for other preprocedural examination: Secondary | ICD-10-CM

## 2013-07-15 DIAGNOSIS — IMO0001 Reserved for inherently not codable concepts without codable children: Secondary | ICD-10-CM

## 2013-07-15 DIAGNOSIS — I4510 Unspecified right bundle-branch block: Secondary | ICD-10-CM | POA: Insufficient documentation

## 2013-07-15 DIAGNOSIS — I10 Essential (primary) hypertension: Secondary | ICD-10-CM | POA: Insufficient documentation

## 2013-07-15 DIAGNOSIS — Z87891 Personal history of nicotine dependence: Secondary | ICD-10-CM | POA: Insufficient documentation

## 2013-07-15 DIAGNOSIS — R5381 Other malaise: Secondary | ICD-10-CM | POA: Insufficient documentation

## 2013-07-15 DIAGNOSIS — R0602 Shortness of breath: Secondary | ICD-10-CM | POA: Insufficient documentation

## 2013-07-15 DIAGNOSIS — Z951 Presence of aortocoronary bypass graft: Secondary | ICD-10-CM | POA: Insufficient documentation

## 2013-07-15 HISTORY — DX: Reserved for inherently not codable concepts without codable children: IMO0001

## 2013-07-15 MED ORDER — TECHNETIUM TC 99M SESTAMIBI GENERIC - CARDIOLITE
11.0000 | Freq: Once | INTRAVENOUS | Status: AC | PRN
  Administered 2013-07-15: 11 via INTRAVENOUS

## 2013-07-15 MED ORDER — REGADENOSON 0.4 MG/5ML IV SOLN
0.4000 mg | Freq: Once | INTRAVENOUS | Status: AC
  Administered 2013-07-15: 0.4 mg via INTRAVENOUS

## 2013-07-15 MED ORDER — TECHNETIUM TC 99M SESTAMIBI GENERIC - CARDIOLITE
33.0000 | Freq: Once | INTRAVENOUS | Status: AC | PRN
  Administered 2013-07-15: 33 via INTRAVENOUS

## 2013-07-15 NOTE — Telephone Encounter (Signed)
Dr Nilsa Nutting office checking on status of fax request for surgical clearance, pls  fax [phone] , surgery next tuesday

## 2013-07-15 NOTE — Progress Notes (Signed)
[facility] SITE 3 NUCLEAR MED [address] [place], [place] [address] [phone]    Cardiology Nuclear Med Study  Jeremiah [name] is a 62 y.o. male     MRN : [id number]     DOB: 12-14-1950  Procedure Date: 07/15/2013  Nuclear Med Background Indication for Stress Test:  Evaluation for Ischemia, Graft Patency, and Surgical Clearance for  (R) TKR on 07-23-13 by Durene Romans, MD History:  '98 CABG '08 MPS: NL EF: 54%, ECHO EF: 50-55% Cardiac Risk Factors: History of Smoking, Hypertension, Lipids and RBBB  Symptoms:  Fatigue and SOB   Nuclear Pre-Procedure Caffeine/Decaff Intake:  None > 12 hrs NPO After: 7:00pm   Lungs:  clear O2 Sat: 95% on room air. IV 0.9% NS with Angio Cath:  20g  IV Site: R Antecubital x 1, tolerated well IV Started by:  Irean Hong, RN  Chest Size (in):  44 Cup Size: n/a  Height: 5\' 5"  (1.651 m)  Weight:  250 lb (113.399 kg)  BMI:  Body mass index is 41.6 kg/(m^2). Tech Comments:  Took Labetalol this am    Nuclear Med Study 1 or 2 day study: 1 day  Stress Test Type:  Lexiscan  Reading MD: Dietrich Pates, MD  Order Authorizing Provider:  Charlton Haws, MD, and Durene Romans, MD  Resting Radionuclide: Technetium 91m Sestamibi  Resting Radionuclide Dose: 11.0 mCi   Stress Radionuclide:  Technetium 58m Sestamibi  Stress Radionuclide Dose: 32.0 mCi           Stress Protocol Rest HR: 56 Stress HR: 68  Rest BP: 110/55 Stress BP: 114/43  Exercise Time (min): n/a METS: n/a   Predicted Max HR: 159 bpm % Max HR: 42.77 bpm Rate Pressure Product: 7752   Dose of Adenosine (mg):  n/a Dose of Lexiscan: 0.4 mg  Dose of Atropine (mg): n/a Dose of Dobutamine: n/a mcg/kg/min (at max HR)  Stress Test Technologist: Milana Na, EMT-P  Nuclear Technologist:  Doyne Keel, CNMT     Rest Procedure:  Myocardial perfusion imaging was performed at rest 45 minutes following the intravenous administration of Technetium 59m Sestamibi. Rest ECG: SB 59  First  degree AVBlock  RBBB.  LAFB.  Stress Procedure:  The patient received IV Lexiscan 0.4 mg over 15-seconds.  Technetium 48m Sestamibi injected at 30-seconds. This patient was lt. Headed with the Lexiscan injection. Quantitative spect images were obtained after a 45 minute delay. Stress ECG: No significant change from baseline ECG  QPS Raw Data Images:  Stress images were motion corrected  Soft tissue (diaphragm, bowel) underlie heart. Stress Images:  Normal homogeneous uptake in all areas of the myocardium. Rest Images:  Normal homogeneous uptake in all areas of the myocardium. Subtraction (SDS):  No evidence of ischemia. Transient Ischemic Dilatation (Normal <1.22):  n/a Lung/Heart Ratio (Normal <0.45):  0.51  Quantitative Gated Spect Images QGS EDV:  170 ml QGS ESV:  79 ml  Impression Exercise Capacity:  Lexiscan with no exercise. BP Response:  Normal blood pressure response. Clinical Symptoms:  No chest pain. ECG Impression:  No significant ST segment change suggestive of ischemia. Comparison with Prior Nuclear Study:No change from previous study  Overall Impression:  Normal stress nuclear study.  LV Ejection Fraction: 54%.  LV Wall Motion:  NL LV Function; NL Wall Motion Dietrich Pates

## 2013-07-15 NOTE — Patient Instructions (Addendum)
20 Jeremiah [name]  07/15/2013   Your procedure is scheduled on: 07/23/13  Report to [facility] at 6:00 AM.  Call this number if you have problems the morning of surgery [phone]: [phone]   Remember:   Do not eat food or drink liquids After Midnight.     Take these medicines the morning of surgery with A SIP OF WATER: xanax if needed, bupropion, labetalol, prozac   Do not wear jewelry, make-up or nail polish.  Do not wear lotions, powders, or perfumes. You may wear deodorant.  Do not shave 48 hours prior to surgery. Men may shave face and neck.  Do not bring valuables to the hospital.  Contacts, dentures or bridgework may not be worn into surgery.  Leave suitcase in the car. After surgery it may be brought to your room.  For patients admitted to the hospital, checkout time is 11:00 AM the day of discharge.   Patients discharged the day of surgery will not be allowed to drive home.  Name and phone number of your driver: Tyrice Hewitt (wife) [phone]   Please read over the following fact sheets that you were given: MRSA Information, incentive spirometry fact sheet, blood fact sheet Birdie Sons, RN  pre op nurse call if needed [phone]    FAILURE TO FOLLOW THESE INSTRUCTIONS MAY RESULT IN CANCELLATION OF YOUR SURGERY   Patient Signature: ___________________________________________

## 2013-07-15 NOTE — Telephone Encounter (Signed)
LEFT VOICE MAIL PT HAVING  STRESS MYOVIEW DONE TODAY   WILL  CLEAR PT PENDING  MYOVIEW RESULTS .Zack Seal

## 2013-07-15 NOTE — Progress Notes (Addendum)
LOV note 04/16/13 Dr. Katrinka Blazing on chart, EKG 07/12/13 on EPIC, surgery clearance note Dr. Eden Emms 07/16/13 on chart, stress test 07/15/13 on chart

## 2013-07-16 ENCOUNTER — Ambulatory Visit (HOSPITAL_COMMUNITY)
Admission: RE | Admit: 2013-07-16 | Discharge: 2013-07-16 | Disposition: A | Discharge status: Home / Self Care | Payer: 59 | Source: Ambulatory Visit | Attending: Orthopedic Surgery | Admitting: Orthopedic Surgery

## 2013-07-16 ENCOUNTER — Encounter (HOSPITAL_COMMUNITY)
Admission: RE | Admit: 2013-07-16 | Discharge: 2013-07-16 | Disposition: A | Discharge status: Home / Self Care | Payer: 59 | Source: Ambulatory Visit | Attending: Orthopedic Surgery | Admitting: Orthopedic Surgery

## 2013-07-16 ENCOUNTER — Encounter (HOSPITAL_COMMUNITY): Payer: Self-pay

## 2013-07-16 DIAGNOSIS — Z01812 Encounter for preprocedural laboratory examination: Secondary | ICD-10-CM | POA: Insufficient documentation

## 2013-07-16 DIAGNOSIS — Z01818 Encounter for other preprocedural examination: Secondary | ICD-10-CM | POA: Insufficient documentation

## 2013-07-16 DIAGNOSIS — I10 Essential (primary) hypertension: Secondary | ICD-10-CM | POA: Insufficient documentation

## 2013-07-16 DIAGNOSIS — M47814 Spondylosis without myelopathy or radiculopathy, thoracic region: Secondary | ICD-10-CM | POA: Insufficient documentation

## 2013-07-16 DIAGNOSIS — M1710 Unilateral primary osteoarthritis, unspecified knee: Secondary | ICD-10-CM | POA: Insufficient documentation

## 2013-07-16 HISTORY — DX: Reserved for inherently not codable concepts without codable children: IMO0001

## 2013-07-16 HISTORY — DX: Unspecified right bundle-branch block: I45.10

## 2013-07-16 HISTORY — DX: Unspecified osteoarthritis, unspecified site: M19.90

## 2013-07-16 HISTORY — DX: Depression, unspecified: F32.A

## 2013-07-16 HISTORY — DX: Gastro-esophageal reflux disease without esophagitis: K21.9

## 2013-07-16 HISTORY — DX: Angina pectoris, unspecified: I20.9

## 2013-07-16 HISTORY — DX: Major depressive disorder, single episode, unspecified: F32.9

## 2013-07-16 HISTORY — DX: Personal history of urinary calculi: Z87.442

## 2013-07-16 HISTORY — DX: Anxiety disorder, unspecified: F41.9

## 2013-07-16 LAB — CBC
MCH: 30.5 pg (ref 26.0–34.0)
MCV: 95.3 fL (ref 78.0–100.0)
Platelets: 182 10*3/uL (ref 150–400)
RBC: 4.26 MIL/uL (ref 4.22–5.81)
RDW: 13.5 % (ref 11.5–15.5)
WBC: 8.8 10*3/uL (ref 4.0–10.5)

## 2013-07-16 LAB — SURGICAL PCR SCREEN
MRSA, PCR: NEGATIVE
Staphylococcus aureus: POSITIVE — AB

## 2013-07-16 LAB — URINALYSIS, ROUTINE W REFLEX MICROSCOPIC
Glucose, UA: NEGATIVE mg/dL
Hgb urine dipstick: NEGATIVE
Ketones, ur: NEGATIVE mg/dL
Protein, ur: NEGATIVE mg/dL
Urobilinogen, UA: 1 mg/dL (ref 0.0–1.0)

## 2013-07-16 LAB — BASIC METABOLIC PANEL
Calcium: 9.4 mg/dL (ref 8.4–10.5)
Creatinine, Ser: 0.85 mg/dL (ref 0.50–1.35)
GFR calc non Af Amer: >90 mL/min (ref >90)
Glucose, Bld: 103 mg/dL — ABNORMAL HIGH (ref 70–99)
Sodium: 137 mEq/L (ref 135–145)

## 2013-07-16 LAB — APTT: aPTT: 30 seconds (ref 24–37)

## 2013-07-16 LAB — PROTIME-INR: Prothrombin Time: 13.2 seconds (ref 11.6–15.2)
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
## 2013-07-16 NOTE — Progress Notes (Signed)
07/16/13 1124  OBSTRUCTIVE SLEEP APNEA  Have you ever been diagnosed with sleep apnea through a sleep study? No  Do you snore loudly (loud enough to be heard through closed doors)?  1  Do you often feel tired, fatigued, or sleepy during the daytime? 0  Has anyone observed you stop breathing during your sleep? 0  Do you have, or are you being treated for high blood pressure? 1  BMI more than 35 kg/m2? 1  Age over 61 years old? 1  Neck circumference greater than 40 cm/18 inches? 0  Gender: 1  Obstructive Sleep Apnea Score 5  Score 4 or greater  Results sent to PCP

## 2013-07-18 ENCOUNTER — Encounter (HOSPITAL_COMMUNITY): Payer: Self-pay

## 2013-07-22 ENCOUNTER — Telehealth: Payer: Self-pay | Admitting: Cardiovascular Disease

## 2013-07-22 NOTE — Telephone Encounter (Signed)
Documentation for surgical Clearance was discovered in loose paperwork w/ a comment from Dr. Eden Emms. I was not certain if it has been faxed to [facility]. I faxed it to [phone]

## 2013-07-23 ENCOUNTER — Encounter (HOSPITAL_COMMUNITY): Payer: Self-pay | Admitting: Anesthesiology

## 2013-07-23 ENCOUNTER — Inpatient Hospital Stay (HOSPITAL_COMMUNITY)
Admission: RE | Admit: 2013-07-23 | Discharge: 2013-07-24 | DRG: 470 | Disposition: A | Discharge status: Home Health | Payer: 59 | Source: Ambulatory Visit | Attending: Orthopedic Surgery | Admitting: Orthopedic Surgery

## 2013-07-23 ENCOUNTER — Ambulatory Visit (HOSPITAL_COMMUNITY): Payer: 59 | Admitting: Anesthesiology

## 2013-07-23 ENCOUNTER — Encounter (HOSPITAL_COMMUNITY)
Admission: RE | Disposition: A | Discharge status: Home Health | Payer: Self-pay | Source: Ambulatory Visit | Attending: Orthopedic Surgery

## 2013-07-23 ENCOUNTER — Encounter (HOSPITAL_COMMUNITY): Payer: Self-pay | Admitting: *Deleted

## 2013-07-23 DIAGNOSIS — Z96651 Presence of right artificial knee joint: Secondary | ICD-10-CM

## 2013-07-23 DIAGNOSIS — I2510 Atherosclerotic heart disease of native coronary artery without angina pectoris: Secondary | ICD-10-CM | POA: Diagnosis present

## 2013-07-23 DIAGNOSIS — F3289 Other specified depressive episodes: Secondary | ICD-10-CM | POA: Diagnosis present

## 2013-07-23 DIAGNOSIS — F329 Major depressive disorder, single episode, unspecified: Secondary | ICD-10-CM | POA: Diagnosis present

## 2013-07-23 DIAGNOSIS — I10 Essential (primary) hypertension: Secondary | ICD-10-CM | POA: Diagnosis present

## 2013-07-23 DIAGNOSIS — Z96659 Presence of unspecified artificial knee joint: Secondary | ICD-10-CM

## 2013-07-23 DIAGNOSIS — Z951 Presence of aortocoronary bypass graft: Secondary | ICD-10-CM

## 2013-07-23 DIAGNOSIS — M6580 Other synovitis and tenosynovitis, unspecified site: Secondary | ICD-10-CM | POA: Diagnosis present

## 2013-07-23 DIAGNOSIS — D500 Iron deficiency anemia secondary to blood loss (chronic): Secondary | ICD-10-CM | POA: Diagnosis not present

## 2013-07-23 DIAGNOSIS — Z6841 Body Mass Index (BMI) 40.0 and over, adult: Secondary | ICD-10-CM

## 2013-07-23 DIAGNOSIS — I714 Abdominal aortic aneurysm, without rupture, unspecified: Secondary | ICD-10-CM | POA: Diagnosis present

## 2013-07-23 DIAGNOSIS — M1710 Unilateral primary osteoarthritis, unspecified knee: Principal | ICD-10-CM | POA: Diagnosis present

## 2013-07-23 DIAGNOSIS — F411 Generalized anxiety disorder: Secondary | ICD-10-CM | POA: Diagnosis present

## 2013-07-23 DIAGNOSIS — D62 Acute posthemorrhagic anemia: Secondary | ICD-10-CM | POA: Diagnosis not present

## 2013-07-23 DIAGNOSIS — E7800 Pure hypercholesterolemia, unspecified: Secondary | ICD-10-CM | POA: Diagnosis present

## 2013-07-23 HISTORY — PX: TOTAL KNEE ARTHROPLASTY: SHX125

## 2013-07-23 LAB — GLUCOSE, CAPILLARY: Glucose-Capillary: 208 mg/dL — ABNORMAL HIGH (ref 70–99)

## 2013-07-23 LAB — TYPE AND SCREEN: ABO/RH(D): A POS

## 2013-07-23 SURGERY — ARTHROPLASTY, KNEE, TOTAL
Anesthesia: Spinal | Site: Knee | Laterality: Right | Wound class: Clean

## 2013-07-23 MED ORDER — ALUM & MAG HYDROXIDE-SIMETH 200-200-20 MG/5ML PO SUSP: 30.0000 mL | ORAL | Status: DC | PRN

## 2013-07-23 MED ORDER — OXYCODONE HCL 5 MG PO TABS
10.0000 mg | ORAL_TABLET | ORAL | Status: DC
  Administered 2013-07-23: 20 mg via ORAL
  Administered 2013-07-23 (×3): 10 mg via ORAL
  Administered 2013-07-24 (×3): 20 mg via ORAL
  Administered 2013-07-24: 15 mg via ORAL
  Filled 2013-07-23: qty 4
  Filled 2013-07-23: qty 2
  Filled 2013-07-23: qty 4
  Filled 2013-07-23: qty 3
  Filled 2013-07-23 (×2): qty 2
  Filled 2013-07-23 (×2): qty 4

## 2013-07-23 MED ORDER — METHOCARBAMOL 100 MG/ML IJ SOLN
500.0000 mg | Freq: Four times a day (QID) | INTRAVENOUS | Status: DC | PRN
  Filled 2013-07-23: qty 5

## 2013-07-23 MED ORDER — SODIUM CHLORIDE 0.9 % IV SOLN
INTRAVENOUS | Status: DC
  Administered 2013-07-23 – 2013-07-24 (×2): via INTRAVENOUS
  Filled 2013-07-23 (×5): qty 1000

## 2013-07-23 MED ORDER — FLUOXETINE HCL 40 MG PO CAPS: 40.0000 mg | ORAL_CAPSULE | Freq: Every evening | ORAL | Status: DC

## 2013-07-23 MED ORDER — METOCLOPRAMIDE HCL 5 MG/ML IJ SOLN: 5.0000 mg | Freq: Three times a day (TID) | INTRAMUSCULAR | Status: DC | PRN

## 2013-07-23 MED ORDER — HYDROMORPHONE HCL PF 1 MG/ML IJ SOLN
0.5000 mg | INTRAMUSCULAR | Status: DC | PRN
  Administered 2013-07-23 – 2013-07-24 (×5): 1 mg via INTRAVENOUS
  Filled 2013-07-23 (×5): qty 1

## 2013-07-23 MED ORDER — SODIUM CHLORIDE 0.9 % IR SOLN
Status: DC | PRN
  Administered 2013-07-23: 1000 mL

## 2013-07-23 MED ORDER — KETOROLAC TROMETHAMINE 30 MG/ML IJ SOLN
INTRAMUSCULAR | Status: DC | PRN
  Administered 2013-07-23: 30 mg

## 2013-07-23 MED ORDER — CELECOXIB 200 MG PO CAPS
200.0000 mg | ORAL_CAPSULE | Freq: Once | ORAL | Status: AC
  Administered 2013-07-23: 200 mg via ORAL
  Filled 2013-07-23: qty 1

## 2013-07-23 MED ORDER — POLYETHYLENE GLYCOL 3350 17 G PO PACK
17.0000 g | PACK | Freq: Two times a day (BID) | ORAL | Status: DC
  Administered 2013-07-23 – 2013-07-24 (×2): 17 g via ORAL

## 2013-07-23 MED ORDER — DIPHENHYDRAMINE HCL 25 MG PO CAPS: 25.0000 mg | ORAL_CAPSULE | Freq: Four times a day (QID) | ORAL | Status: DC | PRN

## 2013-07-23 MED ORDER — BISACODYL 10 MG RE SUPP: 10.0000 mg | Freq: Every day | RECTAL | Status: DC | PRN

## 2013-07-23 MED ORDER — DEXAMETHASONE SODIUM PHOSPHATE 10 MG/ML IJ SOLN
10.0000 mg | Freq: Once | INTRAMUSCULAR | Status: AC
  Administered 2013-07-23: 10 mg via INTRAVENOUS

## 2013-07-23 MED ORDER — BUPIVACAINE-EPINEPHRINE 0.25% -1:200000 IJ SOLN
INTRAMUSCULAR | Status: DC | PRN
  Administered 2013-07-23: 25 mL

## 2013-07-23 MED ORDER — CEFAZOLIN SODIUM-DEXTROSE 2-3 GM-% IV SOLR
2.0000 g | Freq: Four times a day (QID) | INTRAVENOUS | Status: AC
  Administered 2013-07-23 (×2): 2 g via INTRAVENOUS
  Filled 2013-07-23 (×2): qty 50

## 2013-07-23 MED ORDER — BUPROPION HCL ER (XL) 300 MG PO TB24
300.0000 mg | ORAL_TABLET | Freq: Every morning | ORAL | Status: DC
  Administered 2013-07-24: 300 mg via ORAL
  Filled 2013-07-23: qty 1

## 2013-07-23 MED ORDER — FERROUS SULFATE 325 (65 FE) MG PO TABS
325.0000 mg | ORAL_TABLET | Freq: Three times a day (TID) | ORAL | Status: DC
  Administered 2013-07-23 – 2013-07-24 (×3): 325 mg via ORAL
  Filled 2013-07-23 (×5): qty 1

## 2013-07-23 MED ORDER — ONDANSETRON HCL 4 MG/2ML IJ SOLN: 4.0000 mg | Freq: Four times a day (QID) | INTRAMUSCULAR | Status: DC | PRN

## 2013-07-23 MED ORDER — ALPRAZOLAM 1 MG PO TABS
1.0000 mg | ORAL_TABLET | Freq: Three times a day (TID) | ORAL | Status: DC
Start: 2013-07-23 — End: 2013-07-24
  Administered 2013-07-23 – 2013-07-24 (×3): 1 mg via ORAL
  Filled 2013-07-23 (×3): qty 1

## 2013-07-23 MED ORDER — CEFAZOLIN SODIUM-DEXTROSE 2-3 GM-% IV SOLR
2.0000 g | INTRAVENOUS | Status: AC
  Administered 2013-07-23: 2 g via INTRAVENOUS

## 2013-07-23 MED ORDER — ASPIRIN EC 325 MG PO TBEC
325.0000 mg | DELAYED_RELEASE_TABLET | Freq: Two times a day (BID) | ORAL | Status: DC
  Administered 2013-07-24: 325 mg via ORAL
  Filled 2013-07-23 (×3): qty 1

## 2013-07-23 MED ORDER — FLUOXETINE HCL 20 MG PO CAPS
40.0000 mg | ORAL_CAPSULE | Freq: Every evening | ORAL | Status: DC
  Filled 2013-07-23: qty 2

## 2013-07-23 MED ORDER — FLEET ENEMA 7-19 GM/118ML RE ENEM: 1.0000 | ENEMA | Freq: Once | RECTAL | Status: AC | PRN

## 2013-07-23 MED ORDER — HYDROMORPHONE HCL PF 1 MG/ML IJ SOLN: 0.2500 mg | INTRAMUSCULAR | Status: DC | PRN

## 2013-07-23 MED ORDER — ALLOPURINOL 100 MG PO TABS
100.0000 mg | ORAL_TABLET | Freq: Two times a day (BID) | ORAL | Status: DC
  Administered 2013-07-23 – 2013-07-24 (×2): 100 mg via ORAL
  Filled 2013-07-23 (×3): qty 1

## 2013-07-23 MED ORDER — DOCUSATE SODIUM 100 MG PO CAPS
100.0000 mg | ORAL_CAPSULE | Freq: Two times a day (BID) | ORAL | Status: DC
  Administered 2013-07-23 – 2013-07-24 (×2): 100 mg via ORAL

## 2013-07-23 MED ORDER — SIMVASTATIN 40 MG PO TABS
40.0000 mg | ORAL_TABLET | Freq: Every day | ORAL | Status: DC
  Administered 2013-07-23: 40 mg via ORAL
  Filled 2013-07-23 (×2): qty 1

## 2013-07-23 MED ORDER — LABETALOL HCL 100 MG PO TABS
100.0000 mg | ORAL_TABLET | Freq: Two times a day (BID) | ORAL | Status: DC
  Administered 2013-07-23 – 2013-07-24 (×2): 100 mg via ORAL
  Filled 2013-07-23 (×3): qty 1

## 2013-07-23 MED ORDER — CEFAZOLIN SODIUM-DEXTROSE 2-3 GM-% IV SOLR
INTRAVENOUS | Status: AC
  Filled 2013-07-23: qty 50

## 2013-07-23 MED ORDER — PHENOL 1.4 % MT LIQD: 1.0000 | OROMUCOSAL | Status: DC | PRN

## 2013-07-23 MED ORDER — GLYCOPYRROLATE 0.2 MG/ML IJ SOLN
INTRAMUSCULAR | Status: DC | PRN
  Administered 2013-07-23: 0.2 mg via INTRAVENOUS

## 2013-07-23 MED ORDER — SODIUM CHLORIDE 0.9 % IJ SOLN
INTRAMUSCULAR | Status: DC | PRN
  Administered 2013-07-23: 10:00:00

## 2013-07-23 MED ORDER — ACETAMINOPHEN 500 MG PO TABS
1000.0000 mg | ORAL_TABLET | Freq: Once | ORAL | Status: AC
  Administered 2013-07-23: 1000 mg via ORAL

## 2013-07-23 MED ORDER — FENTANYL CITRATE 0.05 MG/ML IJ SOLN
INTRAMUSCULAR | Status: DC | PRN
  Administered 2013-07-23: 100 ug via INTRAVENOUS

## 2013-07-23 MED ORDER — BUPROPION HCL ER (XL) 300 MG PO TB24
300.0000 mg | ORAL_TABLET | Freq: Every morning | ORAL | Status: DC
  Filled 2013-07-23: qty 1

## 2013-07-23 MED ORDER — DEXAMETHASONE SODIUM PHOSPHATE 10 MG/ML IJ SOLN
10.0000 mg | Freq: Once | INTRAMUSCULAR | Status: AC
  Administered 2013-07-24: 10 mg via INTRAVENOUS
  Filled 2013-07-23: qty 1

## 2013-07-23 MED ORDER — PHENYLEPHRINE HCL 10 MG/ML IJ SOLN
INTRAMUSCULAR | Status: DC | PRN
  Administered 2013-07-23 (×5): 80 ug via INTRAVENOUS

## 2013-07-23 MED ORDER — BUPIVACAINE IN DEXTROSE 0.75-8.25 % IT SOLN
INTRATHECAL | Status: DC | PRN
  Administered 2013-07-23: 2 mL via INTRATHECAL

## 2013-07-23 MED ORDER — PROPOFOL INFUSION 10 MG/ML OPTIME
INTRAVENOUS | Status: DC | PRN
  Administered 2013-07-23: 50 ug/kg/min via INTRAVENOUS

## 2013-07-23 MED ORDER — ONDANSETRON HCL 4 MG PO TABS: 4.0000 mg | ORAL_TABLET | Freq: Four times a day (QID) | ORAL | Status: DC | PRN

## 2013-07-23 MED ORDER — LACTATED RINGERS IV SOLN
INTRAVENOUS | Status: DC | PRN
  Administered 2013-07-23 (×3): via INTRAVENOUS

## 2013-07-23 MED ORDER — PROPOFOL 10 MG/ML IV BOLUS
INTRAVENOUS | Status: DC | PRN
  Administered 2013-07-23: 30 mg via INTRAVENOUS
  Administered 2013-07-23: 50 mg via INTRAVENOUS

## 2013-07-23 MED ORDER — MEPERIDINE HCL 50 MG/ML IJ SOLN: 6.2500 mg | INTRAMUSCULAR | Status: DC | PRN

## 2013-07-23 MED ORDER — KETOROLAC TROMETHAMINE 15 MG/ML IJ SOLN
15.0000 mg | Freq: Four times a day (QID) | INTRAMUSCULAR | Status: DC
  Administered 2013-07-23 – 2013-07-24 (×4): 15 mg via INTRAVENOUS
  Filled 2013-07-23 (×8): qty 1

## 2013-07-23 MED ORDER — ZOLPIDEM TARTRATE 5 MG PO TABS
5.0000 mg | ORAL_TABLET | Freq: Every evening | ORAL | Status: DC | PRN
  Administered 2013-07-23: 5 mg via ORAL
  Filled 2013-07-23: qty 1

## 2013-07-23 MED ORDER — ACETAMINOPHEN 500 MG PO TABS
ORAL_TABLET | ORAL | Status: AC
  Filled 2013-07-23: qty 2

## 2013-07-23 MED ORDER — METHOCARBAMOL 500 MG PO TABS
500.0000 mg | ORAL_TABLET | Freq: Four times a day (QID) | ORAL | Status: DC | PRN
  Administered 2013-07-23 – 2013-07-24 (×4): 500 mg via ORAL
  Filled 2013-07-23 (×4): qty 1

## 2013-07-23 MED ORDER — 0.9 % SODIUM CHLORIDE (POUR BTL) OPTIME
TOPICAL | Status: DC | PRN
  Administered 2013-07-23: 1000 mL

## 2013-07-23 MED ORDER — METOCLOPRAMIDE HCL 10 MG PO TABS: 5.0000 mg | ORAL_TABLET | Freq: Three times a day (TID) | ORAL | Status: DC | PRN

## 2013-07-23 MED ORDER — EPHEDRINE SULFATE 50 MG/ML IJ SOLN
INTRAMUSCULAR | Status: DC | PRN
  Administered 2013-07-23: 10 mg via INTRAVENOUS

## 2013-07-23 MED ORDER — BUPIVACAINE-EPINEPHRINE PF 0.25-1:200000 % IJ SOLN
INTRAMUSCULAR | Status: AC
  Filled 2013-07-23: qty 30

## 2013-07-23 MED ORDER — OXYCODONE HCL 5 MG PO TABS: 5.0000 mg | ORAL_TABLET | Freq: Once | ORAL | Status: DC | PRN

## 2013-07-23 MED ORDER — MIDAZOLAM HCL 5 MG/5ML IJ SOLN
INTRAMUSCULAR | Status: DC | PRN
  Administered 2013-07-23: 2 mg via INTRAVENOUS

## 2013-07-23 MED ORDER — KETOROLAC TROMETHAMINE 30 MG/ML IJ SOLN
INTRAMUSCULAR | Status: AC
  Filled 2013-07-23: qty 1

## 2013-07-23 MED ORDER — PROMETHAZINE HCL 25 MG/ML IJ SOLN: 6.2500 mg | INTRAMUSCULAR | Status: DC | PRN

## 2013-07-23 MED ORDER — BUPIVACAINE LIPOSOME 1.3 % IJ SUSP
20.0000 mL | Freq: Once | INTRAMUSCULAR | Status: DC
  Filled 2013-07-23: qty 20

## 2013-07-23 MED ORDER — OXYCODONE HCL 5 MG/5ML PO SOLN
5.0000 mg | Freq: Once | ORAL | Status: DC | PRN
  Filled 2013-07-23: qty 5

## 2013-07-23 MED ORDER — MENTHOL 3 MG MT LOZG: 1.0000 | LOZENGE | OROMUCOSAL | Status: DC | PRN

## 2013-07-23 SURGICAL SUPPLY — 56 items
ADH SKN CLS APL DERMABOND .7 (GAUZE/BANDAGES/DRESSINGS) ×1
BAG SPEC THK2 15X12 ZIP CLS (MISCELLANEOUS)
BAG ZIPLOCK 12X15 (MISCELLANEOUS) ×1 IMPLANT
BANDAGE ELASTIC 6 VELCRO ST LF (GAUZE/BANDAGES/DRESSINGS) ×2 IMPLANT
BANDAGE ESMARK 6X9 LF (GAUZE/BANDAGES/DRESSINGS) ×1 IMPLANT
BLADE SAW SGTL 13.0X1.19X90.0M (BLADE) ×2 IMPLANT
BNDG CMPR 9X6 STRL LF SNTH (GAUZE/BANDAGES/DRESSINGS) ×1
BNDG ESMARK 6X9 LF (GAUZE/BANDAGES/DRESSINGS) ×2
BOWL SMART MIX CTS (DISPOSABLE) ×2 IMPLANT
CAPT RP KNEE ×1 IMPLANT
CEMENT HV SMART SET (Cement) ×2 IMPLANT
CLOTH BEACON ORANGE TIMEOUT ST (SAFETY) ×2 IMPLANT
CUFF TOURN SGL QUICK 44 (TOURNIQUET CUFF) ×1 IMPLANT
DERMABOND ADVANCED (GAUZE/BANDAGES/DRESSINGS) ×1
DERMABOND ADVANCED .7 DNX12 (GAUZE/BANDAGES/DRESSINGS) ×1 IMPLANT
DRAPE EXTREMITY T 121X128X90 (DRAPE) ×2 IMPLANT
DRAPE POUCH INSTRU U-SHP 10X18 (DRAPES) ×2 IMPLANT
DRAPE U-SHAPE 47X51 STRL (DRAPES) ×2 IMPLANT
DRSG AQUACEL AG ADV 3.5X10 (GAUZE/BANDAGES/DRESSINGS) ×2 IMPLANT
DRSG TEGADERM 4X4.75 (GAUZE/BANDAGES/DRESSINGS) ×2 IMPLANT
DURAPREP 26ML APPLICATOR (WOUND CARE) ×2 IMPLANT
ELECT REM PT RETURN 9FT ADLT (ELECTROSURGICAL) ×2
ELECTRODE REM PT RTRN 9FT ADLT (ELECTROSURGICAL) ×1 IMPLANT
EVACUATOR 1/8 PVC DRAIN (DRAIN) ×2 IMPLANT
FACESHIELD LNG OPTICON STERILE (SAFETY) ×10 IMPLANT
GAUZE SPONGE 2X2 8PLY STRL LF (GAUZE/BANDAGES/DRESSINGS) ×1 IMPLANT
GLOVE BIOGEL PI IND STRL 7.5 (GLOVE) ×1 IMPLANT
GLOVE BIOGEL PI IND STRL 8 (GLOVE) ×1 IMPLANT
GLOVE BIOGEL PI INDICATOR 7.5 (GLOVE) ×1
GLOVE BIOGEL PI INDICATOR 8 (GLOVE) ×1
GLOVE ECLIPSE 8.0 STRL XLNG CF (GLOVE) ×2 IMPLANT
GLOVE ORTHO TXT STRL SZ7.5 (GLOVE) ×4 IMPLANT
GOWN BRE IMP PREV XXLGXLNG (GOWN DISPOSABLE) ×2 IMPLANT
GOWN STRL NON-REIN LRG LVL3 (GOWN DISPOSABLE) ×4 IMPLANT
HANDPIECE INTERPULSE COAX TIP (DISPOSABLE) ×2
KIT BASIN OR (CUSTOM PROCEDURE TRAY) ×2 IMPLANT
MANIFOLD NEPTUNE II (INSTRUMENTS) ×2 IMPLANT
NDL SAFETY ECLIPSE 18X1.5 (NEEDLE) ×1 IMPLANT
NEEDLE HYPO 18GX1.5 SHARP (NEEDLE) ×2
NS IRRIG 1000ML POUR BTL (IV SOLUTION) ×3 IMPLANT
PACK TOTAL JOINT (CUSTOM PROCEDURE TRAY) ×2 IMPLANT
POSITIONER SURGICAL ARM (MISCELLANEOUS) ×2 IMPLANT
SET HNDPC FAN SPRY TIP SCT (DISPOSABLE) ×1 IMPLANT
SET PAD KNEE POSITIONER (MISCELLANEOUS) ×2 IMPLANT
SPONGE GAUZE 2X2 STER 10/PKG (GAUZE/BANDAGES/DRESSINGS) ×1
SUCTION FRAZIER 12FR DISP (SUCTIONS) ×2 IMPLANT
SUT MNCRL AB 4-0 PS2 18 (SUTURE) ×2 IMPLANT
SUT VIC AB 1 CT1 36 (SUTURE) ×2 IMPLANT
SUT VIC AB 2-0 CT1 27 (SUTURE) ×6
SUT VIC AB 2-0 CT1 TAPERPNT 27 (SUTURE) ×3 IMPLANT
SUT VLOC 180 0 24IN GS25 (SUTURE) ×2 IMPLANT
SYR 50ML LL SCALE MARK (SYRINGE) ×2 IMPLANT
TOWEL OR 17X26 10 PK STRL BLUE (TOWEL DISPOSABLE) ×4 IMPLANT
TRAY FOLEY METER SIL LF 16FR (CATHETERS) ×1 IMPLANT
WATER STERILE IRR 1500ML POUR (IV SOLUTION) ×3 IMPLANT
WRAP KNEE MAXI GEL POST OP (GAUZE/BANDAGES/DRESSINGS) ×2 IMPLANT

## 2013-07-23 NOTE — Transfer of Care (Signed)
Immediate Anesthesia Transfer of Care Note  Patient: Jeremiah [name]  Procedure(s) Performed: Procedure(s): RIGHT TOTAL KNEE ARTHROPLASTY (Right)  Patient Location: PACU  Anesthesia Type:Spinal  Level of Consciousness: awake, alert  and oriented  Airway & Oxygen Therapy: Patient Spontanous Breathing and Patient connected to face mask oxygen  Post-op Assessment: Report given to PACU RN and Post -op Vital signs reviewed and stable  Post vital signs: Reviewed and stable  Complications: No apparent anesthesia complications

## 2013-07-23 NOTE — Interval H&P Note (Signed)
History and Physical Interval Note:  07/23/2013 7:05 AM  Jeremiah [name]  has presented today for surgery, with the diagnosis of RIGHT KNEE OA  The various methods of treatment have been discussed with the patient and family. After consideration of risks, benefits and other options for treatment, the patient has consented to  Procedure(s): RIGHT TOTAL KNEE ARTHROPLASTY (Right) as a surgical intervention .  The patient's history has been reviewed, patient examined, no change in status, stable for surgery.  I have reviewed the patient's chart and labs.  Questions were answered to the patient's satisfaction.     Shelda Pal

## 2013-07-23 NOTE — Care Management Note (Addendum)
    Page 1 of 1   07/25/2013     12:29:37 PM   CARE MANAGEMENT NOTE 07/25/2013  Patient:  Jeremiah [name],Jeremiah [name]   Account Number:  [id number]  Date Initiated:  07/23/2013  Documentation initiated by:  Colleen Can  Subjective/Objective Assessment:   dx Rt total knee replacemnt     Action/Plan:   CM spoke with patient and spouse. Plans are for patient to return to his home in [place] where spouse will be caregiver. He is requesting [facility] for [place] services. Has used them in the past. Already has RW, cane and commode.   Anticipated DC Date:  07/26/2013   Anticipated DC Plan:  HOME W HOME HEALTH SERVICES      DC Planning Services  CM consult      [facility] Choice  HOME HEALTH   Choice offered to / List presented to:  C-1 Patient        HH arranged  HH-2 PT      [facility] agency  [facility]   Status of service:  Completed, signed off Medicare Important Message given?   (If response is "NO", the following Medicare IM given date fields will be blank) Date Medicare IM given:   Date Additional Medicare IM given:    Discharge Disposition:  HOME W HOME HEALTH SERVICES  Per UR Regulation:  Reviewed for med. necessity/level of care/duration of stay  If discussed at Long Length of Stay Meetings, dates discussed:    Comments:  07/23/2013 Colleen Can BSN RN CCM [phone] [facility] rep notified of pt's request for their services.

## 2013-07-23 NOTE — Anesthesia Procedure Notes (Addendum)
Spinal  Patient location during procedure: OR Start time: 07/23/2013 8:42 AM End time: 07/23/2013 8:49 AM Staffing Anesthesiologist: Lewie Loron R Performed by: anesthesiologist  Preanesthetic Checklist Completed: patient identified, site marked, surgical consent, pre-op evaluation, timeout performed, IV checked, risks and benefits discussed and monitors and equipment checked Spinal Block Patient position: sitting Prep: Betadine Patient monitoring: heart rate, continuous pulse ox and blood pressure Location: L2-3 Injection technique: single-shot Needle Needle type: Sprotte  Needle gauge: 24 G Needle length: 9 cm Assessment Sensory level: T8 Additional Notes Expiration date of kit checked and confirmed. Patient tolerated procedure well, without complications.

## 2013-07-23 NOTE — Evaluation (Signed)
Physical Therapy Evaluation Patient Details Name: Jeremiah [name] MRN: [id number] DOB: [birth year] Today's Date: 07/23/2013 Time: 4098-1191 PT Time Calculation (min): 17 min  PT Assessment / Plan / Recommendation History of Present Illness  s/p RTKA on 07/23/13  Clinical Impression  Pt tolerated ambulating x 175 '/ Pt plans DC to home. Has DME.    PT Assessment  Patient needs continued PT services    Follow Up Recommendations  Home health PT    Does the patient have the potential to tolerate intense rehabilitation      Barriers to Discharge        Equipment Recommendations  None recommended by PT    Recommendations for Other Services     Frequency 7X/week    Precautions / Restrictions Precautions Precautions: Knee   Pertinent Vitals/Pain Pt reports pain 6-7. RN in to give more meds. Ice applied.      Mobility  Bed Mobility Bed Mobility: Supine to Sit Supine to Sit: 5: Supervision Details for Bed Mobility Assistance: Pt states"let me do it." Transfers Transfers: Sit to Stand;Stand to Sit Sit to Stand: 5: Supervision Stand to Sit: 5: Supervision Details for Transfer Assistance: pt able to move RLE without assist Ambulation/Gait Ambulation/Gait Assistance: 4: Min guard Ambulation Distance (Feet): 180 Feet Assistive device: Rolling walker Gait Pattern: Step-to pattern;Antalgic    Exercises     PT Diagnosis: Difficulty walking;Acute pain  PT Problem List: Decreased strength;Decreased range of motion;Decreased activity tolerance;Decreased mobility;Pain PT Treatment Interventions: DME instruction;Gait training;Stair training;Therapeutic activities;Therapeutic exercise;Patient/family education     PT Goals(Current goals can be found in the care plan section) Acute Rehab PT Goals Patient Stated Goal: I want to walk, it hurts. PT Goal Formulation: With patient/family Time For Goal Achievement: 07/30/13 Potential to Achieve Goals: Good  Visit Information  Last PT  Received On: 07/23/13 Assistance Needed: +1 History of Present Illness: s/p RTKA on 07/23/13       Prior Functioning  Home Living Family/patient expects to be discharged to:: Private residence Living Arrangements: Spouse/significant other;Children Available Help at Discharge: Family Type of Home: House Home Access: Stairs to enter Secretary/administrator of Steps: 2 Home Layout: One level Home Equipment: Environmental consultant - 2 wheels;Cane - single point;Crutches Prior Function Level of Independence: Independent with assistive device(s) Communication Communication: No difficulties    Cognition  Cognition Arousal/Alertness: Awake/alert Behavior During Therapy: WFL for tasks assessed/performed Overall Cognitive Status: Within Functional Limits for tasks assessed    Extremity/Trunk Assessment Upper Extremity Assessment Upper Extremity Assessment: Overall WFL for tasks assessed Lower Extremity Assessment Lower Extremity Assessment: RLE deficits/detail RLE Deficits / Details: + SLR, knee flexed to 60   Balance    End of Session PT - End of Session Activity Tolerance: Patient tolerated treatment well Patient left: in chair;with call bell/phone within reach;with family/visitor present  GP     Rada Hay 07/23/2013, 3:34 PM Blanchard Kelch PT [phone]

## 2013-07-23 NOTE — Anesthesia Preprocedure Evaluation (Addendum)
Anesthesia Evaluation  Patient identified by MRN, date of birth, ID band Patient awake    Reviewed: Allergy & Precautions, H&P , NPO status , Patient's Chart, lab work & pertinent test results, reviewed documented beta blocker date and time   Airway Mallampati: I TM Distance: >3 FB Neck ROM: Full    Dental  (+) Dental Advisory Given, Teeth Intact and Chipped   Pulmonary neg pulmonary ROS,  breath sounds clear to auscultation        Cardiovascular hypertension, Pt. on medications and Pt. on home beta blockers + angina + CAD, + CABG and + Peripheral Vascular Disease Rhythm:Regular Rate:Normal  Myocardial perfusion study 07/16/2013 EF 54%, no evidence of inducible ischemia    Neuro/Psych PSYCHIATRIC DISORDERS Anxiety Depression negative neurological ROS     GI/Hepatic Neg liver ROS, GERD-  Controlled,  Endo/Other  negative endocrine ROS  Renal/GU negative Renal ROS     Musculoskeletal negative musculoskeletal ROS (+)   Abdominal (+) + obese,   Peds  Hematology negative hematology ROS (+)   Anesthesia Other Findings   Reproductive/Obstetrics                         Anesthesia Physical Anesthesia Plan  ASA: III  Anesthesia Plan: Spinal   Post-op Pain Management:    Induction:   Airway Management Planned:   Additional Equipment:   Intra-op Plan:   Post-operative Plan:   Informed Consent: I have reviewed the patients History and Physical, chart, labs and discussed the procedure including the risks, benefits and alternatives for the proposed anesthesia with the patient or authorized representative who has indicated his/her understanding and acceptance.   Dental advisory given  Plan Discussed with: CRNA  Anesthesia Plan Comments:        Anesthesia Quick Evaluation

## 2013-07-23 NOTE — Progress Notes (Signed)
Utilization review completed.  

## 2013-07-23 NOTE — Plan of Care (Signed)
Problem: Consults Goal: Diagnosis- Total Joint Replacement Primary Total Knee     

## 2013-07-23 NOTE — Op Note (Signed)
NAME:  Jeremiah [name]                      MEDICAL RECORD NO.:  [id number]                             FACILITY:  [facility]      PHYSICIAN:  Madlyn Frankel. Charlann Boxer, M.D.  DATE OF BIRTH:  [birth year]      DATE OF PROCEDURE:  07/23/2013                                     OPERATIVE REPORT         PREOPERATIVE DIAGNOSIS:  Right knee severe osteoarthritis.      POSTOPERATIVE DIAGNOSIS:  Right knee severe osteoarthritis with history of left total knee replacement. 2. Obesity     FINDINGS:  The patient was noted to have complete loss of cartilage and   bone-on-bone arthritis with associated osteophytes in all three compartments of   the knee with a significant synovitis and associated effusion and joint subluxation.      PROCEDURE:  Right total knee replacement.      COMPONENTS USED:  DePuy rotating platform posterior stabilized knee   system, a size 4 femur, 5 tibia, 15 mm PS insert, and 41 patellar   button.      SURGEON:  Madlyn Frankel. Charlann Boxer, M.D.      ASSISTANT:  Lanney Gins, PA-C.      ANESTHESIA:  Spinal.      SPECIMENS:  None.      COMPLICATION:  None.      DRAINS:  One Hemovac.  EBL: <250cc      TOURNIQUET TIME:   Total Tourniquet Time Documented: Thigh (Right) - 44 minutes Total: Thigh (Right) - 44 minutes  .      The patient was stable to the recovery room.      INDICATION FOR PROCEDURE:  Jeremiah [name] is a 62 y.o. male patient of   mine.  The patient had been seen, evaluated, and treated conservatively in the   office with medication, activity modification, and injections.  The patient had   radiographic changes of bone-on-bone arthritis with endplate sclerosis and osteophytes noted.      The patient failed conservative measures including medication, injections, and activity modification, and at this point was ready for more definitive measures.   Based on the radiographic changes and failed conservative measures, the patient   decided to proceed with total knee  replacement.  Risks of infection,   DVT, component failure, need for revision surgery, postop course, and   expectations were all   discussed and reviewed.  Consent was obtained for benefit of pain   relief.      PROCEDURE IN DETAIL:  The patient was brought to the operative theater.   Once adequate anesthesia, preoperative antibiotics, 2 gm of Anecf administered, the patient was positioned supine with the right thigh tourniquet placed.  The  right lower extremity was prepped and draped in sterile fashion.  A time-   out was performed identifying the patient, planned procedure, and   extremity.      The right lower extremity was placed in the [facility] leg holder.  The leg was   exsanguinated, tourniquet elevated to 250 mmHg.  A midline incision was   made followed by  median parapatellar arthrotomy.  Following initial   exposure, attention was first directed to the patella.  Precut   measurement was noted to be 27 mm.  I resected down to 15 mm and used a   41 patellar button to restore patellar height as well as cover the cut   surface.      The lug holes were drilled and a metal shim was placed to protect the   patella from retractors and saw blades.      At this point, attention was now directed to the femur.  The femoral   canal was opened with a drill, irrigated to try to prevent fat emboli.  An   intramedullary rod was passed at 3 degrees valgus, 10 mm of bone was   resected off the distal femur.  Following this resection, the tibia was   subluxated anteriorly.  Using the extramedullary guide, 6 mm of bone was resected off   the proximal lateral tibia.  I spent time to remove significant osteophytes around the proximal medial tibia.  We confirmed the gap would be   stable medially and laterally with a 10 mm insert as well as confirmed   the cut was perpendicular in the coronal plane, checking with an alignment rod.      Once this was done, I sized the femur to be a size 4 in the  anterior-   posterior dimension, chose a standard component based on medial and   lateral dimension.  The size 4 rotation block was then pinned in   position anterior referenced using the C-clamp to set rotation.  The   anterior, posterior, and  chamfer cuts were made without difficulty nor   notching making certain that I was along the anterior cortex to help   with flexion gap stability.  I spent time at this point removing significant amount of osteophytes around the medial, lateral and posterior femur.     The final box cut was made off the lateral aspect of distal femur.      At this point, the tibia was sized to be a size 5, the size 5 tray was   then pinned in position through the medial third of the tubercle,   drilled, and keel punched.  Trial reduction was now carried with a 4 standard femur,  5 tibia, a 15 mm insert, and the 41 patella botton.  The knee was brought to   extension, full extension with good flexion stability with the patella   tracking through the trochlea without application of pressure.  Given   all these findings, the trial components removed.  Final components were   opened and cement was mixed.  The knee was irrigated with normal saline   solution and pulse lavage.  The synovial lining was   then injected with 0.25% Marcaine with epinephrine and 1 cc of Toradol,   total of 61 cc.      The knee was irrigated.  Final implants were then cemented onto clean and   dried cut surfaces of bone with the knee brought to extension with a 15 mm trial insert.      Once the cement had fully cured, the excess cement was removed   throughout the knee.  I confirmed I was satisfied with the range of   motion and stability, and the final 15 mm PS insert was chosen.  It was   placed into the knee.      The tourniquet had  been let down at 44 minutes.  No significant   hemostasis required.  The medium Hemovac drain was placed deep.  The   extensor mechanism was then  reapproximated using #1 Vicryl with the knee   in flexion.  The   remaining wound was closed with 2-0 Vicryl and running 4-0 Monocryl.   The knee was cleaned, dried, dressed sterilely using Dermabond and   Aquacel dressing.  Drain site dressed separately.  The patient was then   brought to recovery room in stable condition, tolerating the procedure   well.   Please note that Physician Assistant, Lanney Gins, was present for the entirety of the case, and was utilized for pre-operative positioning, peri-operative retractor management, general facilitation of the procedure.  He was also utilized for primary wound closure at the end of the case.              Madlyn Frankel Charlann Boxer, M.D.

## 2013-07-23 NOTE — Anesthesia Postprocedure Evaluation (Signed)
Anesthesia Post Note  Patient: Jeremiah [name]  Procedure(s) Performed: Procedure(s) (LRB): RIGHT TOTAL KNEE ARTHROPLASTY (Right)  Anesthesia type: Spinal  Patient location: PACU  Post pain: Pain level controlled  Post assessment: Post-op Vital signs reviewed  Last Vitals: BP 127/81  Pulse 60  Temp(Src) 36.3 C (Oral)  Resp 16  SpO2 100%  Post vital signs: Reviewed  Level of consciousness: sedated  Complications: No apparent anesthesia complications

## 2013-07-23 NOTE — Preoperative (Signed)
Beta Blockers   Reason not to administer Beta Blockers:Not Applicable 

## 2013-07-24 ENCOUNTER — Encounter (HOSPITAL_COMMUNITY): Payer: Self-pay | Admitting: Orthopedic Surgery

## 2013-07-24 DIAGNOSIS — D500 Iron deficiency anemia secondary to blood loss (chronic): Secondary | ICD-10-CM | POA: Diagnosis not present

## 2013-07-24 LAB — CBC
MCHC: 32.7 g/dL (ref 30.0–36.0)
RDW: 12.7 % (ref 11.5–15.5)
WBC: 13.4 10*3/uL — ABNORMAL HIGH (ref 4.0–10.5)

## 2013-07-24 LAB — BASIC METABOLIC PANEL
Chloride: 102 mEq/L (ref 96–112)
GFR calc Af Amer: >90 mL/min (ref >90)
GFR calc non Af Amer: >90 mL/min (ref >90)
Potassium: 4.6 mEq/L (ref 3.5–5.1)
Sodium: 133 mEq/L — ABNORMAL LOW (ref 135–145)

## 2013-07-24 MED ORDER — FERROUS SULFATE 325 (65 FE) MG PO TABS: 325.0000 mg | ORAL_TABLET | Freq: Three times a day (TID) | ORAL | Status: DC

## 2013-07-24 MED ORDER — OXYCODONE HCL 10 MG PO TABS: 10.0000 mg | ORAL_TABLET | ORAL | Status: DC

## 2013-07-24 MED ORDER — POLYETHYLENE GLYCOL 3350 17 G PO PACK: 17.0000 g | PACK | Freq: Two times a day (BID) | ORAL | Status: DC

## 2013-07-24 MED ORDER — ACETAMINOPHEN 325 MG PO TABS: 650.0000 mg | ORAL_TABLET | Freq: Four times a day (QID) | ORAL | Status: DC | PRN

## 2013-07-24 MED ORDER — DSS 100 MG PO CAPS: 100.0000 mg | ORAL_CAPSULE | Freq: Two times a day (BID) | ORAL | Status: DC

## 2013-07-24 MED ORDER — METHOCARBAMOL 500 MG PO TABS: 500.0000 mg | ORAL_TABLET | Freq: Four times a day (QID) | ORAL | Status: DC | PRN

## 2013-07-24 MED ORDER — ASPIRIN 325 MG PO TBEC: 325.0000 mg | DELAYED_RELEASE_TABLET | Freq: Two times a day (BID) | ORAL | Status: AC

## 2013-07-24 NOTE — Evaluation (Signed)
Occupational Therapy Evaluation Patient Details Name: MEDFORD STAHELI MRN: [id number] DOB: December 20, 1950 Today's Date: 07/24/2013 Time: 4098-1191 OT Time Calculation (min): 11 min  OT Assessment / Plan / Recommendation History of present illness s/p RTKA on 07/23/13   Clinical Impression   Pt presents to OT s/p TKA. All education complete. No further OT needs    OT Assessment  Patient does not need any further OT services    Follow Up Recommendations  No OT follow up       Equipment Recommendations  None recommended by OT          Precautions / Restrictions Precautions Precautions: None       ADL  Grooming: Performed;Wash/dry hands Where Assessed - Grooming: Unsupported standing Lower Body Dressing: Performed;Supervision/safety Where Assessed - Lower Body Dressing: Unsupported sit to stand Toilet Transfer: Performed;Supervision/safety Toilet Transfer Method: Sit to Barista: Regular height toilet Toileting - Clothing Manipulation and Hygiene: Performed;Supervision/safety Where Assessed - Engineer, mining and Hygiene: Standing Tub/Shower Transfer: Simulated;Supervision/safety Tub/Shower Transfer Method: Ambulating       Visit Information  Last OT Received On: 07/24/13 History of Present Illness: s/p RTKA on 07/23/13       Prior Functioning     Home Living Family/patient expects to be discharged to:: Private residence Living Arrangements: Spouse/significant other;Children Available Help at Discharge: Family Type of Home: House Home Access: Stairs to enter Secretary/administrator of Steps: 2 Home Layout: One level Home Equipment: Environmental consultant - 2 wheels;Cane - single point;Crutches Additional Comments: Pt plans on having someone installing grab bars Prior Function Level of Independence: Independent with assistive device(s) Communication Communication: No difficulties         Vision/Perception Vision - History Patient  Visual Report: No change from baseline   Cognition  Cognition Arousal/Alertness: Awake/alert Behavior During Therapy: WFL for tasks assessed/performed Overall Cognitive Status: Within Functional Limits for tasks assessed    Extremity/Trunk Assessment Upper Extremity Assessment Upper Extremity Assessment: Overall WFL for tasks assessed     Mobility Bed Mobility Bed Mobility: Supine to Sit Supine to Sit: 5: Supervision Transfers Transfers: Sit to Stand;Stand to Sit Sit to Stand: 5: Supervision;From bed;From toilet;With upper extremity assist Stand to Sit: 5: Supervision;To toilet;To bed;With upper extremity assist Details for Transfer Assistance: verbal cues for hand placement           End of Session OT - End of Session Activity Tolerance: Patient tolerated treatment well Patient left: in chair;with call bell/phone within reach;with family/visitor present  GO     Ezrael Sam, Metro Kung 07/24/2013, 10:31 AM

## 2013-07-24 NOTE — Progress Notes (Signed)
[facility]   [facility] is providing the following services: Patient declined RW and Commode  If patient discharges after hours, please call [phone].   Jeremiah [name] 07/24/2013, 10:36 AM

## 2013-07-24 NOTE — Progress Notes (Signed)
Physical Therapy Treatment Patient Details Name: Jeremiah [name] MRN: [id number] DOB: [birth year] Today's Date: 07/24/2013 Time: 4098-1191 PT Time Calculation (min): 28 min  PT Assessment / Plan / Recommendation  History of Present Illness s/p RTKA on 07/23/13   PT Comments   POD # 1 am session.  Assisted pt OOB to amb in hallway then performed TKR TE's following handout. Pt plans to D/C to home today.   Follow Up Recommendations  Home health PT     Does the patient have the potential to tolerate intense rehabilitation     Barriers to Discharge        Equipment Recommendations  None recommended by PT    Recommendations for Other Services    Frequency 7X/week   Progress towards PT Goals Progress towards PT goals: Progressing toward goals  Plan      Precautions / Restrictions Precautions Precautions: Knee Restrictions Weight Bearing Restrictions: No Other Position/Activity Restrictions: WBAT    Pertinent Vitals/Pain C/o 5/10 after TE's ICE applied    Mobility  Bed Mobility Bed Mobility: Supine to Sit Supine to Sit: 5: Supervision Details for Bed Mobility Assistance: increased time Transfers Transfers: Sit to Stand;Stand to Sit Sit to Stand: 5: Supervision;From bed;With upper extremity assist Stand to Sit: 5: Supervision;To bed;With upper extremity assist Details for Transfer Assistance: one initial VC on hand placement with stand to sit Ambulation/Gait Ambulation/Gait Assistance: 4: Min guard Ambulation Distance (Feet): 200 Feet Assistive device: Rolling walker Ambulation/Gait Assistance Details: increased time and <25% VC's on safety with turns and prioper walker to self distance. Gait Pattern: Step-to pattern;Antalgic Gait velocity: decreased    Exercises   Total Knee Replacement TE's 10 reps B LE ankle pumps 10 reps knee presses 10 reps heel slides  10 reps SAQ's 10 reps SLR's 10 reps ABD Followed by ICE    PT Goals (current goals can now be found in  the care plan section)    Visit Information  Last PT Received On: 07/24/13 Assistance Needed: +1 History of Present Illness: s/p RTKA on 07/23/13    Subjective Data      Cognition  Cognition Arousal/Alertness: Awake/alert Behavior During Therapy: [facility] for tasks assessed/performed Overall Cognitive Status: Within Functional Limits for tasks assessed    Balance     End of Session PT - End of Session Equipment Utilized During Treatment: Gait belt Activity Tolerance: Patient tolerated treatment well Patient left: in bed;with call bell/phone within reach;with family/visitor present   Felecia Shelling  PTA [facility]  Acute  Rehab Pager      [phone]

## 2013-07-24 NOTE — Progress Notes (Signed)
   Subjective: 1 Day Post-Op Procedure(s) (LRB): RIGHT TOTAL KNEE ARTHROPLASTY (Right)   Patient reports pain as mild, pain well controlled. No events throughout the night. Ready to be discharged home.  Objective:   VITALS:   Filed Vitals:   07/24/13 0508  BP: 115/68  Pulse: 63  Temp: 97.7 F (36.5 C)  Resp: 16    Neurovascular intact Dorsiflexion/Plantar flexion intact Incision: dressing C/D/I No cellulitis present Compartment soft  LABS  Recent Labs  07/24/13 0412  HGB 9.7*  HCT 29.7*  WBC 13.4*  PLT 193     Recent Labs  07/24/13 0412  NA 133*  K 4.6  BUN 19  CREATININE 0.87  GLUCOSE 150*     Assessment/Plan: 1 Day Post-Op Procedure(s) (LRB): RIGHT TOTAL KNEE ARTHROPLASTY (Right) HV drain d/c'ed Foley cath d/c'ed Advance diet Up with therapy D/C IV fluids Discharge home with home health Follow up in 2 weeks at [facility]. Follow up with OLIN,Dimitris Shanahan D in 2 weeks.  Contact information:  [facility] [address], [address] [place] [address] [phone]    Expected ABLA  Treated with iron and will observe  Morbid Obesity (BMI >40)  Estimated body mass index is 41.93 kg/(m^2) as calculated from the following:   Height as of this encounter: 5\' 5"  (1.651 m).   Weight as of this encounter: 114.306 kg (252 lb). Patient also counseled that weight may inhibit the healing process Patient counseled that losing weight will help with future health issues       Anastasio Auerbach. Devontre Siedschlag   PAC  07/24/2013, 8:49 AM

## 2013-07-24 NOTE — Progress Notes (Signed)
Physical Therapy Treatment Patient Details Name: ALLANTE WHITMIRE MRN: [id number] DOB: Sep 06, 1951 Today's Date: 07/24/2013 Time: 4034-7425 PT Time Calculation (min): 25 min  PT Assessment / Plan / Recommendation  History of Present Illness s/p RTKA on 07/23/13   PT Comments   POD # 1 pm session.  Amb pt a second time then performed sitting TE's following handout on HEP.  Instructed on use of ICE and proper positioning R LE in extension.  Pt plans to D/C to home today.   Follow Up Recommendations  Home health PT     Does the patient have the potential to tolerate intense rehabilitation     Barriers to Discharge        Equipment Recommendations  None recommended by PT    Recommendations for Other Services    Frequency 7X/week   Progress towards PT Goals Progress towards PT goals: Progressing toward goals  Plan      Precautions / Restrictions Precautions Precautions: Knee Restrictions Weight Bearing Restrictions: No Other Position/Activity Restrictions: WBAT    Pertinent Vitals/Pain C/o 3/10 ICE applied    Mobility  Bed Mobility Bed Mobility: Not assessed Supine to Sit: 5: Supervision Details for Bed Mobility Assistance: Pt OOB in recliner Transfers Transfers: Sit to Stand;Stand to Sit Sit to Stand: 5: Supervision;With upper extremity assist;From chair/3-in-1 Stand to Sit: 5: Supervision;With upper extremity assist;To chair/3-in-1 Details for Transfer Assistance: increased time Ambulation/Gait Ambulation/Gait Assistance: 5: Supervision Ambulation Distance (Feet): 250 Feet (225 feet x 2) Assistive device: Rolling walker Ambulation/Gait Assistance Details: increased time and <25% Vc's on safety with turns and backward gait to chair Gait Pattern: Step-to pattern;Antalgic Gait velocity: decreased    Exercises  10 reps knee bends in sitting 10 reps LAQ's in sitting    PT Goals (current goals can now be found in the care plan section)    Visit Information  Last PT  Received On: 07/24/13 Assistance Needed: +1 History of Present Illness: s/p RTKA on 07/23/13    Subjective Data      Cognition  Cognition Arousal/Alertness: Awake/alert Behavior During Therapy: WFL for tasks assessed/performed Overall Cognitive Status: Within Functional Limits for tasks assessed    Balance     End of Session PT - End of Session Equipment Utilized During Treatment: Gait belt Activity Tolerance: Patient tolerated treatment well Patient left: in chair;with call bell/phone within reach;with family/visitor present   Felecia Shelling  PTA [facility]  Acute  Rehab Pager      [phone]

## 2013-07-26 NOTE — Discharge Summary (Signed)
Physician Discharge Summary  Patient ID: Jeremiah [name] MRN: [id number] DOB/AGE: 61/07/1951 61 y.o.  Admit date: 07/23/2013 Discharge date: 07/24/2013   Procedures:  Procedure(s) (LRB): RIGHT TOTAL KNEE ARTHROPLASTY (Right)  Attending Physician:  Dr. Durene Romans   Admission Diagnoses:   Right knee OA / pain  Discharge Diagnoses:  Principal Problem:   S/P right TKA Active Problems:   Morbid obesity   Expected blood loss anemia  Past Medical History  Diagnosis Date  . CAD (coronary artery disease)   . HTN (hypertension)   . Hypercholesteremia   . Gout   . Anginal pain 1998    "discomfort"  . Normal cardiac stress test 07/15/13  . Right bundle branch block   . Anxiety   . Depression   . GERD (gastroesophageal reflux disease)     hx of  . History of kidney stones     right side  . Arthritis     knees, ankles, shoulders    HPI: Jeremiah [name], 62 y.o. male, has a history of pain and functional disability in the right knee due to arthritis and has failed non-surgical conservative treatments for greater than 12 weeks to include NSAID's and/or analgesics, corticosteriod injections and activity modification. Onset of symptoms was gradual, starting years ago with rapidlly worsening over the last 2 years. The patient noted prior procedures on the knee to include arthroscopy on the right knee(s). Patient currently rates pain in the right knee(s) at 8 out of 10 with activity. Patient has night pain, worsening of pain with activity and weight bearing, pain that interferes with activities of daily living, pain with passive range of motion, crepitus and joint swelling. Patient has evidence of periarticular osteophytes and joint space narrowing by imaging studies. There is no active signs of infection. Risks, benefits and expectations were discussed with the patient. Patient understand the risks, benefits and expectations and wishes to proceed with surgery.  PCP: Allean Found,  MD   Discharged Condition: good  Hospital Course:  Patient underwent the above stated procedure on 07/23/2013. Patient tolerated the procedure well and brought to the recovery room in good condition and subsequently to the floor.  POD #1 BP: 115/68 ; Pulse: 63 ; Temp: 97.7 F (36.5 C) ; Resp: 16  Pt's foley was removed, as well as the hemovac drain removed. IV was changed to a saline lock. Patient reports pain as mild, pain well controlled. No events throughout the night. Ready to be discharged home. Neurovascular intact, dorsiflexion/plantar flexion intact, incision: dressing C/D/I, no cellulitis present and compartment soft.   LABS  Basename    HGB  9.7  HCT  29.7    Discharge Exam: General appearance: alert, cooperative and no distress Extremities: Homans sign is negative, no sign of DVT, no edema, redness or tenderness in the calves or thighs and no ulcers, gangrene or trophic changes  Disposition:   Home-Health Care Svc with follow up in 2 weeks   Follow-up Information   Follow up with Shelda Pal, MD. Schedule an appointment as soon as possible for a visit in 2 weeks.   Specialty:  Orthopedic Surgery   Contact information:   [address] [place] [address] [phone]       Discharge Orders   Future Orders Complete By Expires   Call MD / Call 911  As directed    Comments:     If you experience chest pain or shortness of breath, CALL 911 and be  transported to the hospital emergency room.  If you develope a fever above 101 F, pus (white drainage) or increased drainage or redness at the wound, or calf pain, call your surgeon's office.   Change dressing  As directed    Comments:     Maintain surgical dressing for 10-14 days, then change the dressing daily with sterile 4 x 4 inch gauze dressing and tape. Keep the area dry and clean.   Constipation Prevention  As directed    Comments:     Drink plenty of fluids.  Prune juice may be helpful.  You  may use a stool softener, such as Colace (over the counter) 100 mg twice a day.  Use MiraLax (over the counter) for constipation as needed.   Diet - low sodium heart healthy  As directed    Discharge instructions  As directed    Comments:     Maintain surgical dressing for 10-14 days, then replace with gauze and tape. Keep the area dry and clean until follow up. Follow up in 2 weeks at [facility]. Call with any questions or concerns.   Driving restrictions  As directed    Comments:     No driving for 4 weeks   Increase activity slowly as tolerated  As directed    TED hose  As directed    Comments:     Use stockings (TED hose) for 2 weeks on both leg(s).  You may remove them at night for sleeping.   Weight bearing as tolerated  As directed         Medication List         acetaminophen 325 MG tablet  Commonly known as:  TYLENOL  Take 2 tablets (650 mg total) by mouth every 6 (six) hours as needed for pain.     allopurinol 100 MG tablet  Commonly known as:  ZYLOPRIM  Take 100 mg by mouth 2 (two) times daily.     ALPRAZolam 1 MG tablet  Commonly known as:  XANAX  Take 1 mg by mouth 3 (three) times daily.     aspirin 325 MG EC tablet  Take 1 tablet (325 mg total) by mouth 2 (two) times daily.     buPROPion 300 MG 24 hr tablet  Commonly known as:  WELLBUTRIN XL  Take 300 mg by mouth every morning.     calcium carbonate 1250 MG chewable tablet  Commonly known as:  OS-CAL  Chew 1 tablet by mouth daily.     calcium carbonate 500 MG chewable tablet  Commonly known as:  TUMS - dosed in mg elemental calcium  Chew 1 tablet by mouth 2 (two) times daily.     cyanocobalamin 500 MCG tablet  Take 500 mcg by mouth daily.     DSS 100 MG Caps  Take 100 mg by mouth 2 (two) times daily.     ferrous sulfate 325 (65 FE) MG tablet  Take 1 tablet (325 mg total) by mouth 3 (three) times daily after meals.     FLUoxetine 40 MG capsule  Commonly known as:  PROZAC  Take 40 mg  by mouth every evening.     GUMMI BEAR MULTIVITAMIN/MIN PO  Take 1 tablet by mouth daily.     labetalol 100 MG tablet  Commonly known as:  NORMODYNE  Take 100 mg by mouth 2 (two) times daily.     lisinopril 10 MG tablet  Commonly known as:  PRINIVIL,ZESTRIL  Take 10 mg by  mouth every evening.     methocarbamol 500 MG tablet  Commonly known as:  ROBAXIN  Take 1 tablet (500 mg total) by mouth every 6 (six) hours as needed (muscle spasms).     Oxycodone HCl 10 MG Tabs  Take 1-2 tablets (10-20 mg total) by mouth every 4 (four) hours.     polyethylene glycol packet  Commonly known as:  MIRALAX / GLYCOLAX  Take 17 g by mouth 2 (two) times daily.     simvastatin 40 MG tablet  Commonly known as:  ZOCOR  Take 40 mg by mouth at bedtime.     zolpidem 10 MG tablet  Commonly known as:  AMBIEN  Take 10 mg by mouth at bedtime.         Signed: Anastasio Auerbach. Lavell Ridings   PAC  07/26/2013, 1:02 PM

## 2013-10-22 ENCOUNTER — Encounter: Payer: Self-pay | Admitting: Cardiovascular Disease

## 2014-03-13 ENCOUNTER — Ambulatory Visit (INDEPENDENT_AMBULATORY_CARE_PROVIDER_SITE_OTHER): Payer: 59 | Admitting: Cardiovascular Disease

## 2014-03-13 ENCOUNTER — Encounter (INDEPENDENT_AMBULATORY_CARE_PROVIDER_SITE_OTHER): Payer: Self-pay

## 2014-03-13 ENCOUNTER — Encounter: Payer: Self-pay | Admitting: Cardiovascular Disease

## 2014-03-13 VITALS — BP 102/62 | HR 72 | Ht 65.0 in | Wt 225.0 lb

## 2014-03-13 DIAGNOSIS — E7800 Pure hypercholesterolemia, unspecified: Secondary | ICD-10-CM

## 2014-03-13 DIAGNOSIS — I10 Essential (primary) hypertension: Secondary | ICD-10-CM

## 2014-03-13 DIAGNOSIS — I2510 Atherosclerotic heart disease of native coronary artery without angina pectoris: Secondary | ICD-10-CM

## 2014-03-13 DIAGNOSIS — I4510 Unspecified right bundle-branch block: Secondary | ICD-10-CM

## 2014-03-13 NOTE — Patient Instructions (Signed)
Your physician recommends that you continue on your current medications as directed. Please refer to the Current Medication list given to you today.  Your physician wants you to follow-up in: 6 months with Dr. Nishan. You will receive a reminder letter in the mail two months in advance. If you don't receive a letter, please call our office to schedule the follow-up appointment.  

## 2014-03-13 NOTE — Assessment & Plan Note (Signed)
Well controlled.  Continue current medications and low sodium Dash type diet.    

## 2014-03-13 NOTE — Assessment & Plan Note (Signed)
Cholesterol is at goal.  Continue current dose of statin and diet Rx.  No myalgias or side effects.  F/U  LFT's in 6 months. No results found for this basename: LDLCALC  Labs with primary            

## 2014-03-13 NOTE — Assessment & Plan Note (Signed)
Stable with no angina and good activity level.  Continue medical Rx  
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
## 2014-03-13 NOTE — Assessment & Plan Note (Signed)
ECG stable with LAD and PR 212  No syncope Check ECG q 6 months

## 2014-04-21 ENCOUNTER — Ambulatory Visit: Payer: 59

## 2014-05-20 ENCOUNTER — Ambulatory Visit: Payer: 59 | Attending: Family Medicine | Admitting: Physical Therapy

## 2014-05-20 DIAGNOSIS — M25559 Pain in unspecified hip: Secondary | ICD-10-CM | POA: Insufficient documentation

## 2014-05-20 DIAGNOSIS — M545 Low back pain, unspecified: Secondary | ICD-10-CM | POA: Insufficient documentation

## 2014-05-20 DIAGNOSIS — M25569 Pain in unspecified knee: Secondary | ICD-10-CM | POA: Insufficient documentation

## 2014-05-20 DIAGNOSIS — IMO0001 Reserved for inherently not codable concepts without codable children: Secondary | ICD-10-CM | POA: Insufficient documentation

## 2014-05-20 DIAGNOSIS — M6281 Muscle weakness (generalized): Secondary | ICD-10-CM | POA: Insufficient documentation

## 2014-05-26 ENCOUNTER — Ambulatory Visit: Payer: 59 | Attending: Family Medicine

## 2014-05-26 DIAGNOSIS — M545 Low back pain, unspecified: Secondary | ICD-10-CM | POA: Insufficient documentation

## 2014-05-26 DIAGNOSIS — M25559 Pain in unspecified hip: Secondary | ICD-10-CM | POA: Insufficient documentation

## 2014-05-26 DIAGNOSIS — M6281 Muscle weakness (generalized): Secondary | ICD-10-CM | POA: Insufficient documentation

## 2014-05-26 DIAGNOSIS — IMO0001 Reserved for inherently not codable concepts without codable children: Secondary | ICD-10-CM | POA: Insufficient documentation

## 2014-05-26 DIAGNOSIS — M25569 Pain in unspecified knee: Secondary | ICD-10-CM | POA: Insufficient documentation

## 2014-05-29 ENCOUNTER — Ambulatory Visit: Payer: 59

## 2014-05-29 DIAGNOSIS — IMO0001 Reserved for inherently not codable concepts without codable children: Secondary | ICD-10-CM | POA: Diagnosis not present

## 2014-06-10 ENCOUNTER — Encounter: Payer: Self-pay | Admitting: Physical Therapy

## 2014-06-12 ENCOUNTER — Ambulatory Visit: Payer: 59 | Admitting: Physical Therapy

## 2014-06-17 ENCOUNTER — Ambulatory Visit: Payer: 59 | Admitting: Physical Therapy

## 2014-06-17 DIAGNOSIS — IMO0001 Reserved for inherently not codable concepts without codable children: Secondary | ICD-10-CM | POA: Diagnosis not present

## 2014-06-19 ENCOUNTER — Ambulatory Visit: Payer: 59 | Admitting: Physical Therapy

## 2014-06-23 ENCOUNTER — Ambulatory Visit: Payer: 59 | Attending: Family Medicine | Admitting: Physical Therapy

## 2014-06-23 DIAGNOSIS — M6281 Muscle weakness (generalized): Secondary | ICD-10-CM | POA: Insufficient documentation

## 2014-06-23 DIAGNOSIS — IMO0001 Reserved for inherently not codable concepts without codable children: Secondary | ICD-10-CM | POA: Insufficient documentation

## 2014-06-23 DIAGNOSIS — M545 Low back pain, unspecified: Secondary | ICD-10-CM | POA: Insufficient documentation

## 2014-06-23 DIAGNOSIS — M25569 Pain in unspecified knee: Secondary | ICD-10-CM | POA: Insufficient documentation

## 2014-06-23 DIAGNOSIS — M25559 Pain in unspecified hip: Secondary | ICD-10-CM | POA: Insufficient documentation

## 2014-06-25 ENCOUNTER — Encounter: Payer: Self-pay | Admitting: Physical Therapy

## 2014-06-30 ENCOUNTER — Encounter: Payer: Self-pay | Admitting: Physical Therapy

## 2014-07-01 ENCOUNTER — Other Ambulatory Visit: Payer: Self-pay | Admitting: Physical Medicine and Rehabilitation

## 2014-07-01 DIAGNOSIS — M545 Low back pain, unspecified: Secondary | ICD-10-CM

## 2014-07-02 ENCOUNTER — Encounter: Payer: Self-pay | Admitting: Physical Therapy

## 2014-07-06 ENCOUNTER — Ambulatory Visit
Admission: RE | Admit: 2014-07-06 | Discharge: 2014-07-06 | Disposition: A | Discharge status: Home / Self Care | Payer: 59 | Source: Ambulatory Visit | Attending: Physical Medicine and Rehabilitation | Admitting: Physical Medicine and Rehabilitation

## 2014-07-06 DIAGNOSIS — M545 Low back pain, unspecified: Secondary | ICD-10-CM

## 2014-08-05 ENCOUNTER — Encounter: Payer: Self-pay | Admitting: Rehabilitation

## 2014-08-18 ENCOUNTER — Ambulatory Visit: Payer: 59 | Attending: Physical Medicine and Rehabilitation | Admitting: Physical Therapy

## 2014-08-18 DIAGNOSIS — IMO0001 Reserved for inherently not codable concepts without codable children: Secondary | ICD-10-CM | POA: Insufficient documentation

## 2014-08-18 DIAGNOSIS — M545 Low back pain, unspecified: Secondary | ICD-10-CM | POA: Diagnosis not present

## 2014-08-18 DIAGNOSIS — M6281 Muscle weakness (generalized): Secondary | ICD-10-CM | POA: Insufficient documentation

## 2014-08-18 DIAGNOSIS — M25559 Pain in unspecified hip: Secondary | ICD-10-CM | POA: Insufficient documentation

## 2014-08-18 DIAGNOSIS — M25569 Pain in unspecified knee: Secondary | ICD-10-CM | POA: Insufficient documentation

## 2014-08-19 ENCOUNTER — Ambulatory Visit (HOSPITAL_BASED_OUTPATIENT_CLINIC_OR_DEPARTMENT_OTHER): Payer: 59

## 2014-08-20 ENCOUNTER — Ambulatory Visit: Payer: 59 | Admitting: Physical Therapy

## 2014-08-26 ENCOUNTER — Ambulatory Visit: Payer: 59 | Attending: Family Medicine

## 2014-08-26 DIAGNOSIS — M25511 Pain in right shoulder: Secondary | ICD-10-CM | POA: Diagnosis not present

## 2014-08-26 DIAGNOSIS — M75102 Unspecified rotator cuff tear or rupture of left shoulder, not specified as traumatic: Secondary | ICD-10-CM | POA: Diagnosis not present

## 2014-08-26 DIAGNOSIS — M4800 Spinal stenosis, site unspecified: Secondary | ICD-10-CM | POA: Insufficient documentation

## 2014-08-26 DIAGNOSIS — M25512 Pain in left shoulder: Secondary | ICD-10-CM | POA: Diagnosis not present

## 2014-08-26 DIAGNOSIS — G8929 Other chronic pain: Secondary | ICD-10-CM | POA: Diagnosis not present

## 2014-08-26 DIAGNOSIS — Z96653 Presence of artificial knee joint, bilateral: Secondary | ICD-10-CM | POA: Diagnosis not present

## 2014-08-26 DIAGNOSIS — M75101 Unspecified rotator cuff tear or rupture of right shoulder, not specified as traumatic: Secondary | ICD-10-CM | POA: Diagnosis present

## 2014-08-27 ENCOUNTER — Ambulatory Visit: Payer: 59 | Admitting: Physical Therapy

## 2014-08-27 DIAGNOSIS — M75102 Unspecified rotator cuff tear or rupture of left shoulder, not specified as traumatic: Secondary | ICD-10-CM | POA: Diagnosis not present

## 2014-09-01 ENCOUNTER — Ambulatory Visit: Payer: 59 | Admitting: Physical Therapy

## 2014-09-03 ENCOUNTER — Ambulatory Visit: Payer: 59 | Admitting: Physical Therapy

## 2014-09-03 DIAGNOSIS — M75102 Unspecified rotator cuff tear or rupture of left shoulder, not specified as traumatic: Secondary | ICD-10-CM | POA: Diagnosis not present

## 2014-09-08 ENCOUNTER — Ambulatory Visit: Payer: 59

## 2014-09-15 ENCOUNTER — Ambulatory Visit (HOSPITAL_BASED_OUTPATIENT_CLINIC_OR_DEPARTMENT_OTHER): Payer: 59

## 2014-09-17 ENCOUNTER — Encounter: Payer: Self-pay | Admitting: Physical Therapy

## 2014-10-10 ENCOUNTER — Ambulatory Visit (HOSPITAL_BASED_OUTPATIENT_CLINIC_OR_DEPARTMENT_OTHER): Payer: 59 | Attending: Physical Medicine and Rehabilitation

## 2014-10-10 VITALS — Ht 67.0 in | Wt 223.0 lb

## 2014-10-10 DIAGNOSIS — Z6834 Body mass index (BMI) 34.0-34.9, adult: Secondary | ICD-10-CM | POA: Insufficient documentation

## 2014-10-10 DIAGNOSIS — G4733 Obstructive sleep apnea (adult) (pediatric): Secondary | ICD-10-CM

## 2014-10-10 DIAGNOSIS — G4710 Hypersomnia, unspecified: Secondary | ICD-10-CM | POA: Insufficient documentation

## 2014-10-10 DIAGNOSIS — G4730 Sleep apnea, unspecified: Secondary | ICD-10-CM | POA: Diagnosis not present

## 2014-10-11 NOTE — Sleep Study (Signed)
   NAME: Jeremiah [name] DATE OF BIRTH:  [birth year] MEDICAL RECORD NUMBER [id number]  LOCATION: [facility]  PHYSICIAN: Bethene Hankinson D  DATE OF STUDY: 10/10/2014  SLEEP STUDY TYPE: Nocturnal Polysomnogram               REFERRING PHYSICIAN: Margaretha Sheffield, MD  INDICATION FOR STUDY: Hypersomnia with sleep apnea  EPWORTH SLEEPINESS SCORE:   3/24 HEIGHT: 5\' 7"  (170.2 cm)  WEIGHT: 223 lb (101.152 kg)    Body mass index is 34.92 kg/(m^2).  NECK SIZE: 15 in.  MEDICATIONS: Charted for review  SLEEP ARCHITECTURE: Total sleep time 322 minutes with sleep efficiency 88.6%. Stage I was 11.2%, stage II 81.2%, stage III absent, REM 7.6% of total sleep time. Sleep latency 2.5 minutes, REM latency 296.5 minutes, awake after sleep onset 39 minutes, arousal index 6.9, bedtime medication: Trazodone  RESPIRATORY DATA: Apnea hypopnea index (AHI) 6.3 per hour. 34 total events scored including 2 central apneas and 32 hypopneas. Most events were while supine. REM AHI 9.8 per hour. There were not enough early events to permit application of split CPAP titration.  OXYGEN DATA: Mild to moderately loud snoring with oxygen desaturation to a nadir of 82% and mean saturation 92.1% on room air  CARDIAC DATA: Sinus rhythm with PVCs  MOVEMENT/PARASOMNIA: No significant movement disturbance, bathroom 1  IMPRESSION/ RECOMMENDATION:   1) Mild obstructive sleep apnea/hypopnea syndrome, AHI 6.3 per hour with mostly supine events. REM AHI 9.8 per hour. Mild to moderately loud snoring with oxygen desaturation to a nadir of 82% and mean saturation 92.1% on room air. 2) There were not enough events to meet protocol requirements for application of split CPAP titration. Conservative measures such as weight loss and encouragement to sleep off back, with treatment for any significant upper airway congestion, may be sufficient. Otherwise, this patient can return for a dedicated CPAP titration study, or an  oral appliance might be an option.  Deneise Lever Diplomate, American Board of Sleep Medicine  ELECTRONICALLY SIGNED ON:  10/11/2014, 11:17 AM [place] PH: [phone]   FX: [phone] [place]

## 2014-10-13 ENCOUNTER — Other Ambulatory Visit: Payer: Self-pay | Admitting: Physical Medicine and Rehabilitation

## 2014-10-13 ENCOUNTER — Ambulatory Visit
Admission: RE | Admit: 2014-10-13 | Discharge: 2014-10-13 | Disposition: A | Discharge status: Home / Self Care | Payer: 59 | Source: Ambulatory Visit | Attending: Physical Medicine and Rehabilitation | Admitting: Physical Medicine and Rehabilitation

## 2014-10-13 DIAGNOSIS — M25522 Pain in left elbow: Secondary | ICD-10-CM

## 2014-10-14 ENCOUNTER — Telehealth: Payer: Self-pay | Admitting: Cardiovascular Disease

## 2014-10-14 NOTE — Telephone Encounter (Signed)
New message     Need note from Dr Johnsie Cancel stating he had bypass surgery and has heart problems.  This is for social security for disability.  He has disability.  He needs to prove heart history in the family.  Call when ready.

## 2014-10-14 NOTE — Telephone Encounter (Signed)
WILL FORWARD TO DR NISHAN FOR  REVIEW./CY 

## 2014-10-18 NOTE — Telephone Encounter (Signed)
Ok to write letter he had CABG in 54 and has CAD

## 2014-10-20 ENCOUNTER — Encounter: Payer: Self-pay | Admitting: *Deleted

## 2014-10-20 NOTE — Telephone Encounter (Signed)
LETTER WRITTEN  LEFT  MESSAGE FOR  PT'S  WIFE  TO CALL  BACK  TO  NOTIFY  MAY PICK UP LETTER .Jeremiah [name]

## 2014-10-20 NOTE — Telephone Encounter (Signed)
PT'S WIFE  NOTIFIED./CY
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
## 2014-10-29 ENCOUNTER — Ambulatory Visit (INDEPENDENT_AMBULATORY_CARE_PROVIDER_SITE_OTHER): Payer: 59 | Admitting: Cardiovascular Disease

## 2014-10-29 ENCOUNTER — Encounter: Payer: Self-pay | Admitting: Cardiovascular Disease

## 2014-10-29 VITALS — BP 134/74 | HR 60 | Ht 67.0 in | Wt 232.4 lb

## 2014-10-29 DIAGNOSIS — R609 Edema, unspecified: Secondary | ICD-10-CM

## 2014-10-29 DIAGNOSIS — E7800 Pure hypercholesterolemia, unspecified: Secondary | ICD-10-CM

## 2014-10-29 DIAGNOSIS — I2510 Atherosclerotic heart disease of native coronary artery without angina pectoris: Secondary | ICD-10-CM

## 2014-10-29 DIAGNOSIS — I10 Essential (primary) hypertension: Secondary | ICD-10-CM

## 2014-10-29 NOTE — Assessment & Plan Note (Signed)
Well controlled.  Continue current medications and low sodium Dash type diet.    

## 2014-10-29 NOTE — Assessment & Plan Note (Signed)
Cholesterol is at goal.  Continue current dose of statin and diet Rx.  No myalgias or side effects.  F/U  LFT's in 6 months. No results found for: LDLCALC Labs with primary           

## 2014-10-29 NOTE — Assessment & Plan Note (Signed)
PRN diuretic low sodium diet  Previous Right TKR comtributes as does obesity

## 2014-10-29 NOTE — Patient Instructions (Signed)
Your physician wants you to follow-up in:  6 MONTHS WITH DR NISHAN  You will receive a reminder letter in the mail two months in advance. If you don't receive a letter, please call our office to schedule the follow-up appointment. Your physician recommends that you continue on your current medications as directed. Please refer to the Current Medication list given to you today. 

## 2014-10-29 NOTE — Assessment & Plan Note (Signed)
Stable with no angina and good activity level.  Continue medical Rx Refill for nitro called in 

## 2015-04-01 ENCOUNTER — Ambulatory Visit
Admission: RE | Admit: 2015-04-01 | Discharge: 2015-04-01 | Disposition: A | Discharge status: Home / Self Care | Payer: 59 | Source: Ambulatory Visit | Attending: Physical Medicine and Rehabilitation | Admitting: Physical Medicine and Rehabilitation

## 2015-04-01 ENCOUNTER — Other Ambulatory Visit: Payer: Self-pay | Admitting: Physical Medicine and Rehabilitation

## 2015-04-01 DIAGNOSIS — M25512 Pain in left shoulder: Secondary | ICD-10-CM

## 2015-04-01 DIAGNOSIS — M25511 Pain in right shoulder: Secondary | ICD-10-CM
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
## 2015-06-08 ENCOUNTER — Ambulatory Visit (INDEPENDENT_AMBULATORY_CARE_PROVIDER_SITE_OTHER): Payer: 59 | Admitting: Cardiovascular Disease

## 2015-06-08 ENCOUNTER — Encounter: Payer: Self-pay | Admitting: Cardiovascular Disease

## 2015-06-08 VITALS — BP 106/60 | HR 60 | Ht 66.0 in | Wt 219.8 lb

## 2015-06-08 DIAGNOSIS — I2510 Atherosclerotic heart disease of native coronary artery without angina pectoris: Secondary | ICD-10-CM | POA: Diagnosis not present

## 2015-06-08 NOTE — Patient Instructions (Addendum)
Medication Instructions:  DECREASE  LABETALOL TO 1 TAB  DAILY   Labwork: NONE  Testing/Procedures: NONE  Follow-Up: Your physician wants you to follow-up in: [place] will receive a reminder letter in the mail two months in advance. If you don't receive a letter, please call our office to schedule the follow-up appointment.   Any Other Special Instructions Will Be Listed Below (If Applicable).
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
## 2015-12-03 ENCOUNTER — Encounter: Payer: 59 | Admitting: Cardiovascular Disease

## 2015-12-07 ENCOUNTER — Encounter: Payer: Self-pay | Admitting: Cardiovascular Disease

## 2015-12-23 DIAGNOSIS — Z1211 Encounter for screening for malignant neoplasm of colon: Secondary | ICD-10-CM | POA: Diagnosis not present

## 2015-12-23 DIAGNOSIS — Z125 Encounter for screening for malignant neoplasm of prostate: Secondary | ICD-10-CM | POA: Diagnosis not present

## 2015-12-23 DIAGNOSIS — E559 Vitamin D deficiency, unspecified: Secondary | ICD-10-CM | POA: Diagnosis not present

## 2015-12-23 DIAGNOSIS — E7800 Pure hypercholesterolemia, unspecified: Secondary | ICD-10-CM | POA: Diagnosis not present

## 2015-12-23 DIAGNOSIS — Z0000 Encounter for general adult medical examination without abnormal findings: Secondary | ICD-10-CM | POA: Diagnosis not present

## 2015-12-23 DIAGNOSIS — I2510 Atherosclerotic heart disease of native coronary artery without angina pectoris: Secondary | ICD-10-CM | POA: Diagnosis not present

## 2015-12-23 DIAGNOSIS — G4700 Insomnia, unspecified: Secondary | ICD-10-CM | POA: Diagnosis not present

## 2015-12-23 DIAGNOSIS — M129 Arthropathy, unspecified: Secondary | ICD-10-CM | POA: Diagnosis not present

## 2015-12-23 DIAGNOSIS — F329 Major depressive disorder, single episode, unspecified: Secondary | ICD-10-CM | POA: Diagnosis not present

## 2015-12-23 DIAGNOSIS — I10 Essential (primary) hypertension: Secondary | ICD-10-CM | POA: Diagnosis not present

## 2015-12-24 ENCOUNTER — Encounter: Payer: Self-pay | Admitting: Cardiovascular Disease
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
## 2015-12-29 ENCOUNTER — Encounter: Payer: Self-pay | Admitting: Cardiovascular Disease

## 2015-12-29 ENCOUNTER — Ambulatory Visit (INDEPENDENT_AMBULATORY_CARE_PROVIDER_SITE_OTHER): Payer: PPO | Admitting: Cardiovascular Disease

## 2015-12-29 VITALS — BP 130/80 | HR 63 | Ht 66.5 in | Wt 222.0 lb

## 2015-12-29 DIAGNOSIS — I4510 Unspecified right bundle-branch block: Secondary | ICD-10-CM | POA: Diagnosis not present

## 2015-12-29 DIAGNOSIS — I10 Essential (primary) hypertension: Secondary | ICD-10-CM

## 2015-12-29 NOTE — Patient Instructions (Signed)

## 2016-01-01 DIAGNOSIS — Z1211 Encounter for screening for malignant neoplasm of colon: Secondary | ICD-10-CM | POA: Diagnosis not present

## 2016-01-05 DIAGNOSIS — Z79891 Long term (current) use of opiate analgesic: Secondary | ICD-10-CM | POA: Diagnosis not present

## 2016-01-05 DIAGNOSIS — M47817 Spondylosis without myelopathy or radiculopathy, lumbosacral region: Secondary | ICD-10-CM | POA: Diagnosis not present

## 2016-01-05 DIAGNOSIS — G894 Chronic pain syndrome: Secondary | ICD-10-CM | POA: Diagnosis not present

## 2016-01-05 DIAGNOSIS — M4807 Spinal stenosis, lumbosacral region: Secondary | ICD-10-CM | POA: Diagnosis not present

## 2016-01-05 DIAGNOSIS — G5602 Carpal tunnel syndrome, left upper limb: Secondary | ICD-10-CM | POA: Diagnosis not present

## 2016-02-03 DIAGNOSIS — M4807 Spinal stenosis, lumbosacral region: Secondary | ICD-10-CM | POA: Diagnosis not present

## 2016-02-03 DIAGNOSIS — M47817 Spondylosis without myelopathy or radiculopathy, lumbosacral region: Secondary | ICD-10-CM | POA: Diagnosis not present

## 2016-02-03 DIAGNOSIS — G5602 Carpal tunnel syndrome, left upper limb: Secondary | ICD-10-CM | POA: Diagnosis not present

## 2016-02-03 DIAGNOSIS — G894 Chronic pain syndrome: Secondary | ICD-10-CM | POA: Diagnosis not present

## 2016-03-01 DIAGNOSIS — M47817 Spondylosis without myelopathy or radiculopathy, lumbosacral region: Secondary | ICD-10-CM | POA: Diagnosis not present

## 2016-03-01 DIAGNOSIS — M4807 Spinal stenosis, lumbosacral region: Secondary | ICD-10-CM | POA: Diagnosis not present

## 2016-03-01 DIAGNOSIS — G5602 Carpal tunnel syndrome, left upper limb: Secondary | ICD-10-CM | POA: Diagnosis not present

## 2016-03-01 DIAGNOSIS — G894 Chronic pain syndrome: Secondary | ICD-10-CM | POA: Diagnosis not present

## 2016-03-15 ENCOUNTER — Other Ambulatory Visit: Payer: Self-pay | Admitting: Orthopaedic Surgery

## 2016-03-15 DIAGNOSIS — M25512 Pain in left shoulder: Secondary | ICD-10-CM | POA: Diagnosis not present

## 2016-03-22 ENCOUNTER — Other Ambulatory Visit: Payer: Self-pay

## 2016-03-27 ENCOUNTER — Ambulatory Visit
Admission: RE | Admit: 2016-03-27 | Discharge: 2016-03-27 | Disposition: A | Discharge status: Home / Self Care | Payer: PPO | Source: Ambulatory Visit | Attending: Orthopaedic Surgery | Admitting: Orthopaedic Surgery

## 2016-03-27 DIAGNOSIS — S46012A Strain of muscle(s) and tendon(s) of the rotator cuff of left shoulder, initial encounter: Secondary | ICD-10-CM | POA: Diagnosis not present

## 2016-03-27 DIAGNOSIS — M25512 Pain in left shoulder: Secondary | ICD-10-CM

## 2016-03-29 ENCOUNTER — Other Ambulatory Visit: Payer: Self-pay

## 2016-03-29 DIAGNOSIS — M4807 Spinal stenosis, lumbosacral region: Secondary | ICD-10-CM | POA: Diagnosis not present

## 2016-03-29 DIAGNOSIS — G894 Chronic pain syndrome: Secondary | ICD-10-CM | POA: Diagnosis not present

## 2016-03-29 DIAGNOSIS — G5602 Carpal tunnel syndrome, left upper limb: Secondary | ICD-10-CM | POA: Diagnosis not present

## 2016-03-29 DIAGNOSIS — M47817 Spondylosis without myelopathy or radiculopathy, lumbosacral region: Secondary | ICD-10-CM | POA: Diagnosis not present

## 2016-04-05 DIAGNOSIS — M25512 Pain in left shoulder: Secondary | ICD-10-CM | POA: Diagnosis not present

## 2016-04-27 DIAGNOSIS — M47817 Spondylosis without myelopathy or radiculopathy, lumbosacral region: Secondary | ICD-10-CM | POA: Diagnosis not present

## 2016-04-27 DIAGNOSIS — G5602 Carpal tunnel syndrome, left upper limb: Secondary | ICD-10-CM | POA: Diagnosis not present

## 2016-04-27 DIAGNOSIS — G894 Chronic pain syndrome: Secondary | ICD-10-CM | POA: Diagnosis not present

## 2016-04-27 DIAGNOSIS — M4807 Spinal stenosis, lumbosacral region: Secondary | ICD-10-CM | POA: Diagnosis not present

## 2016-05-26 DIAGNOSIS — M47817 Spondylosis without myelopathy or radiculopathy, lumbosacral region: Secondary | ICD-10-CM | POA: Diagnosis not present

## 2016-05-26 DIAGNOSIS — M4807 Spinal stenosis, lumbosacral region: Secondary | ICD-10-CM | POA: Diagnosis not present

## 2016-05-26 DIAGNOSIS — G894 Chronic pain syndrome: Secondary | ICD-10-CM | POA: Diagnosis not present

## 2016-05-26 DIAGNOSIS — G5602 Carpal tunnel syndrome, left upper limb: Secondary | ICD-10-CM | POA: Diagnosis not present

## 2016-06-15 ENCOUNTER — Encounter: Payer: Self-pay | Admitting: *Deleted

## 2016-06-21 DIAGNOSIS — I2510 Atherosclerotic heart disease of native coronary artery without angina pectoris: Secondary | ICD-10-CM | POA: Diagnosis not present

## 2016-06-21 DIAGNOSIS — F329 Major depressive disorder, single episode, unspecified: Secondary | ICD-10-CM | POA: Diagnosis not present

## 2016-06-21 DIAGNOSIS — G8929 Other chronic pain: Secondary | ICD-10-CM | POA: Diagnosis not present

## 2016-06-21 DIAGNOSIS — F419 Anxiety disorder, unspecified: Secondary | ICD-10-CM | POA: Diagnosis not present

## 2016-06-21 DIAGNOSIS — I10 Essential (primary) hypertension: Secondary | ICD-10-CM | POA: Diagnosis not present

## 2016-06-21 DIAGNOSIS — G4700 Insomnia, unspecified: Secondary | ICD-10-CM | POA: Diagnosis not present

## 2016-06-24 DIAGNOSIS — G894 Chronic pain syndrome: Secondary | ICD-10-CM | POA: Diagnosis not present

## 2016-06-24 DIAGNOSIS — M4807 Spinal stenosis, lumbosacral region: Secondary | ICD-10-CM | POA: Diagnosis not present

## 2016-06-24 DIAGNOSIS — M47817 Spondylosis without myelopathy or radiculopathy, lumbosacral region: Secondary | ICD-10-CM | POA: Diagnosis not present

## 2016-06-24 DIAGNOSIS — G5602 Carpal tunnel syndrome, left upper limb: Secondary | ICD-10-CM | POA: Diagnosis not present

## 2016-07-05 ENCOUNTER — Ambulatory Visit (INDEPENDENT_AMBULATORY_CARE_PROVIDER_SITE_OTHER): Payer: PPO | Admitting: Nurse Practitioner

## 2016-07-05 ENCOUNTER — Encounter (INDEPENDENT_AMBULATORY_CARE_PROVIDER_SITE_OTHER): Payer: Self-pay

## 2016-07-05 ENCOUNTER — Encounter: Payer: Self-pay | Admitting: Nurse Practitioner

## 2016-07-05 VITALS — BP 110/64 | HR 52 | Ht 67.0 in | Wt 234.8 lb

## 2016-07-05 DIAGNOSIS — I4510 Unspecified right bundle-branch block: Secondary | ICD-10-CM

## 2016-07-05 DIAGNOSIS — E7800 Pure hypercholesterolemia, unspecified: Secondary | ICD-10-CM

## 2016-07-05 DIAGNOSIS — I10 Essential (primary) hypertension: Secondary | ICD-10-CM | POA: Diagnosis not present

## 2016-07-05 DIAGNOSIS — I2510 Atherosclerotic heart disease of native coronary artery without angina pectoris: Secondary | ICD-10-CM
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
## 2016-07-05 NOTE — Patient Instructions (Addendum)
We will be checking the following labs today - NONE   Medication Instructions:    Continue with your current medicines. BUT  I am stopping your Labetolol today   Testing/Procedures To Be Arranged:  N/A  Follow-Up:   See Dr. Johnsie Cancel in 6 months.     Other Special Instructions:   N/A    If you need a refill on your cardiac medications before your next appointment, please call your pharmacy.   Call the [place] office at [phone] if you have any questions, problems or concerns.

## 2016-07-21 DIAGNOSIS — M4807 Spinal stenosis, lumbosacral region: Secondary | ICD-10-CM | POA: Diagnosis not present

## 2016-07-21 DIAGNOSIS — G5602 Carpal tunnel syndrome, left upper limb: Secondary | ICD-10-CM | POA: Diagnosis not present

## 2016-07-21 DIAGNOSIS — M47817 Spondylosis without myelopathy or radiculopathy, lumbosacral region: Secondary | ICD-10-CM | POA: Diagnosis not present

## 2016-07-21 DIAGNOSIS — G894 Chronic pain syndrome: Secondary | ICD-10-CM | POA: Diagnosis not present

## 2016-08-18 DIAGNOSIS — G894 Chronic pain syndrome: Secondary | ICD-10-CM | POA: Diagnosis not present

## 2016-08-18 DIAGNOSIS — G5602 Carpal tunnel syndrome, left upper limb: Secondary | ICD-10-CM | POA: Diagnosis not present

## 2016-08-18 DIAGNOSIS — M47817 Spondylosis without myelopathy or radiculopathy, lumbosacral region: Secondary | ICD-10-CM | POA: Diagnosis not present

## 2016-08-18 DIAGNOSIS — M4807 Spinal stenosis, lumbosacral region: Secondary | ICD-10-CM | POA: Diagnosis not present

## 2016-09-15 DIAGNOSIS — M47817 Spondylosis without myelopathy or radiculopathy, lumbosacral region: Secondary | ICD-10-CM | POA: Diagnosis not present

## 2016-09-15 DIAGNOSIS — M62830 Muscle spasm of back: Secondary | ICD-10-CM | POA: Diagnosis not present

## 2016-09-15 DIAGNOSIS — Z79891 Long term (current) use of opiate analgesic: Secondary | ICD-10-CM | POA: Diagnosis not present

## 2016-09-15 DIAGNOSIS — M4807 Spinal stenosis, lumbosacral region: Secondary | ICD-10-CM | POA: Diagnosis not present

## 2016-09-15 DIAGNOSIS — G5601 Carpal tunnel syndrome, right upper limb: Secondary | ICD-10-CM | POA: Diagnosis not present

## 2016-09-15 DIAGNOSIS — G894 Chronic pain syndrome: Secondary | ICD-10-CM | POA: Diagnosis not present

## 2016-09-15 DIAGNOSIS — G4700 Insomnia, unspecified: Secondary | ICD-10-CM | POA: Diagnosis not present

## 2016-09-15 DIAGNOSIS — F329 Major depressive disorder, single episode, unspecified: Secondary | ICD-10-CM | POA: Diagnosis not present

## 2016-09-15 DIAGNOSIS — G5602 Carpal tunnel syndrome, left upper limb: Secondary | ICD-10-CM | POA: Diagnosis not present

## 2016-09-20 DIAGNOSIS — M47817 Spondylosis without myelopathy or radiculopathy, lumbosacral region: Secondary | ICD-10-CM | POA: Diagnosis not present

## 2016-10-04 DIAGNOSIS — M47817 Spondylosis without myelopathy or radiculopathy, lumbosacral region: Secondary | ICD-10-CM | POA: Diagnosis not present

## 2016-10-17 DIAGNOSIS — M4807 Spinal stenosis, lumbosacral region: Secondary | ICD-10-CM | POA: Diagnosis not present

## 2016-10-17 DIAGNOSIS — G5602 Carpal tunnel syndrome, left upper limb: Secondary | ICD-10-CM | POA: Diagnosis not present

## 2016-10-17 DIAGNOSIS — G894 Chronic pain syndrome: Secondary | ICD-10-CM | POA: Diagnosis not present

## 2016-10-17 DIAGNOSIS — M47817 Spondylosis without myelopathy or radiculopathy, lumbosacral region: Secondary | ICD-10-CM | POA: Diagnosis not present

## 2016-11-16 DIAGNOSIS — G894 Chronic pain syndrome: Secondary | ICD-10-CM | POA: Diagnosis not present

## 2016-11-16 DIAGNOSIS — G5602 Carpal tunnel syndrome, left upper limb: Secondary | ICD-10-CM | POA: Diagnosis not present

## 2016-11-16 DIAGNOSIS — M4807 Spinal stenosis, lumbosacral region: Secondary | ICD-10-CM | POA: Diagnosis not present

## 2016-11-16 DIAGNOSIS — M47817 Spondylosis without myelopathy or radiculopathy, lumbosacral region: Secondary | ICD-10-CM | POA: Diagnosis not present

## 2016-12-14 DIAGNOSIS — M4807 Spinal stenosis, lumbosacral region: Secondary | ICD-10-CM | POA: Diagnosis not present

## 2016-12-14 DIAGNOSIS — G5602 Carpal tunnel syndrome, left upper limb: Secondary | ICD-10-CM | POA: Diagnosis not present

## 2016-12-14 DIAGNOSIS — G894 Chronic pain syndrome: Secondary | ICD-10-CM | POA: Diagnosis not present

## 2016-12-14 DIAGNOSIS — M47817 Spondylosis without myelopathy or radiculopathy, lumbosacral region: Secondary | ICD-10-CM | POA: Diagnosis not present

## 2016-12-28 ENCOUNTER — Encounter: Payer: Self-pay | Admitting: Nurse Practitioner

## 2016-12-28 DIAGNOSIS — Z1211 Encounter for screening for malignant neoplasm of colon: Secondary | ICD-10-CM | POA: Diagnosis not present

## 2016-12-28 DIAGNOSIS — F329 Major depressive disorder, single episode, unspecified: Secondary | ICD-10-CM | POA: Diagnosis not present

## 2016-12-28 DIAGNOSIS — Z125 Encounter for screening for malignant neoplasm of prostate: Secondary | ICD-10-CM | POA: Diagnosis not present

## 2016-12-28 DIAGNOSIS — E7800 Pure hypercholesterolemia, unspecified: Secondary | ICD-10-CM | POA: Diagnosis not present

## 2016-12-28 DIAGNOSIS — I2510 Atherosclerotic heart disease of native coronary artery without angina pectoris: Secondary | ICD-10-CM | POA: Diagnosis not present

## 2016-12-28 DIAGNOSIS — Z0000 Encounter for general adult medical examination without abnormal findings: Secondary | ICD-10-CM | POA: Diagnosis not present

## 2016-12-28 DIAGNOSIS — I10 Essential (primary) hypertension: Secondary | ICD-10-CM | POA: Diagnosis not present

## 2016-12-28 DIAGNOSIS — M109 Gout, unspecified: Secondary | ICD-10-CM | POA: Diagnosis not present

## 2016-12-28 DIAGNOSIS — G4700 Insomnia, unspecified: Secondary | ICD-10-CM | POA: Diagnosis not present

## 2016-12-28 DIAGNOSIS — Z6837 Body mass index (BMI) 37.0-37.9, adult: Secondary | ICD-10-CM | POA: Diagnosis not present

## 2016-12-29 DIAGNOSIS — M47817 Spondylosis without myelopathy or radiculopathy, lumbosacral region: Secondary | ICD-10-CM | POA: Diagnosis not present
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
## 2017-01-09 ENCOUNTER — Encounter: Payer: Self-pay | Admitting: Cardiovascular Disease

## 2017-01-11 DIAGNOSIS — M47817 Spondylosis without myelopathy or radiculopathy, lumbosacral region: Secondary | ICD-10-CM | POA: Diagnosis not present

## 2017-01-11 DIAGNOSIS — G894 Chronic pain syndrome: Secondary | ICD-10-CM | POA: Diagnosis not present

## 2017-01-11 DIAGNOSIS — G5602 Carpal tunnel syndrome, left upper limb: Secondary | ICD-10-CM | POA: Diagnosis not present

## 2017-01-11 DIAGNOSIS — M4807 Spinal stenosis, lumbosacral region: Secondary | ICD-10-CM | POA: Diagnosis not present

## 2017-01-12 DIAGNOSIS — Z1211 Encounter for screening for malignant neoplasm of colon: Secondary | ICD-10-CM | POA: Diagnosis not present

## 2017-01-18 ENCOUNTER — Encounter: Payer: Self-pay | Admitting: Cardiovascular Disease

## 2017-01-18 ENCOUNTER — Ambulatory Visit (INDEPENDENT_AMBULATORY_CARE_PROVIDER_SITE_OTHER): Payer: PPO | Admitting: Cardiovascular Disease

## 2017-01-18 ENCOUNTER — Encounter (INDEPENDENT_AMBULATORY_CARE_PROVIDER_SITE_OTHER): Payer: Self-pay

## 2017-01-18 VITALS — BP 120/60 | HR 53 | Ht 65.0 in | Wt 232.8 lb

## 2017-01-18 DIAGNOSIS — I10 Essential (primary) hypertension: Secondary | ICD-10-CM

## 2017-01-18 DIAGNOSIS — I4510 Unspecified right bundle-branch block: Secondary | ICD-10-CM

## 2017-01-18 NOTE — Patient Instructions (Signed)

## 2017-02-09 DIAGNOSIS — G5602 Carpal tunnel syndrome, left upper limb: Secondary | ICD-10-CM | POA: Diagnosis not present

## 2017-02-09 DIAGNOSIS — M4807 Spinal stenosis, lumbosacral region: Secondary | ICD-10-CM | POA: Diagnosis not present

## 2017-02-09 DIAGNOSIS — M47817 Spondylosis without myelopathy or radiculopathy, lumbosacral region: Secondary | ICD-10-CM | POA: Diagnosis not present

## 2017-02-09 DIAGNOSIS — G894 Chronic pain syndrome: Secondary | ICD-10-CM | POA: Diagnosis not present

## 2017-02-16 ENCOUNTER — Ambulatory Visit: Payer: PPO | Attending: Physical Medicine and Rehabilitation

## 2017-02-16 DIAGNOSIS — M47816 Spondylosis without myelopathy or radiculopathy, lumbar region: Secondary | ICD-10-CM | POA: Insufficient documentation

## 2017-02-16 DIAGNOSIS — M2560 Stiffness of unspecified joint, not elsewhere classified: Secondary | ICD-10-CM | POA: Insufficient documentation

## 2017-02-16 DIAGNOSIS — M48061 Spinal stenosis, lumbar region without neurogenic claudication: Secondary | ICD-10-CM | POA: Diagnosis not present

## 2017-02-16 DIAGNOSIS — R293 Abnormal posture: Secondary | ICD-10-CM | POA: Diagnosis not present

## 2017-02-16 DIAGNOSIS — M6281 Muscle weakness (generalized): Secondary | ICD-10-CM | POA: Diagnosis not present
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
## 2017-02-21 ENCOUNTER — Ambulatory Visit: Payer: PPO | Attending: Physical Medicine and Rehabilitation

## 2017-02-21 DIAGNOSIS — R293 Abnormal posture: Secondary | ICD-10-CM | POA: Diagnosis not present

## 2017-02-21 DIAGNOSIS — M6281 Muscle weakness (generalized): Secondary | ICD-10-CM

## 2017-02-21 DIAGNOSIS — M47816 Spondylosis without myelopathy or radiculopathy, lumbar region: Secondary | ICD-10-CM | POA: Diagnosis not present

## 2017-02-21 DIAGNOSIS — M48061 Spinal stenosis, lumbar region without neurogenic claudication: Secondary | ICD-10-CM | POA: Insufficient documentation

## 2017-02-21 DIAGNOSIS — M2560 Stiffness of unspecified joint, not elsewhere classified: Secondary | ICD-10-CM

## 2017-02-21 NOTE — Patient Instructions (Signed)
Supine Unilateral Isometric Abduction     Lie, back flat, legs bent, feet together. Rotate knees to one side. Hold ___ seconds. Repeat to other side. Repeat ___ times per session. Do ___ sessions per day.  Copyright  VHI. All rights reserved.  External Rotation: Hip - Knees Apart (Hook-Lying)    Lie with hips and knees bent, band tied just above knees. Pull knees apart. Hold for ___ seconds. Rest for ___ seconds. Repeat ___ times. Do ___ times a day.  Copyright  VHI. All rights reserved.  __ repetitions.  Copyright  VHI. All rights reserved.  Bent Leg Lift (Hook-Lying)    Tighten stomach and slowly raise right leg ____ inches from floor. Keep trunk rigid. Hold ____ seconds. Repeat ____ times per set. Do ____ sets per session. Do ____ sessions per day.  http://orth.exer.us/1090   Copyright  VHI. All rights reserved.  Supine Isometric Adduction    Lie on back. Squeeze ball between knees. Hold ___ seconds. Do ___ sets of ___ repetitions. Advanced: Raise feet off floor and squeeze.  Copyright  VHI. All rights reserved.  ALL DONE 2X/DAY 3-15 RREPS 5 SEC HOLD
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
## 2017-02-23 ENCOUNTER — Ambulatory Visit: Payer: PPO

## 2017-02-23 DIAGNOSIS — M2560 Stiffness of unspecified joint, not elsewhere classified: Secondary | ICD-10-CM

## 2017-02-23 DIAGNOSIS — M6281 Muscle weakness (generalized): Secondary | ICD-10-CM

## 2017-02-23 DIAGNOSIS — M48061 Spinal stenosis, lumbar region without neurogenic claudication: Secondary | ICD-10-CM | POA: Diagnosis not present

## 2017-02-23 DIAGNOSIS — R293 Abnormal posture: Secondary | ICD-10-CM

## 2017-02-23 DIAGNOSIS — M47816 Spondylosis without myelopathy or radiculopathy, lumbar region: Secondary | ICD-10-CM
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
## 2017-02-27 ENCOUNTER — Ambulatory Visit: Payer: PPO

## 2017-03-01 ENCOUNTER — Ambulatory Visit: Payer: PPO | Admitting: Physical Therapy

## 2017-03-02 DIAGNOSIS — I10 Essential (primary) hypertension: Secondary | ICD-10-CM | POA: Diagnosis not present

## 2017-03-06 ENCOUNTER — Ambulatory Visit: Payer: PPO

## 2017-03-06 DIAGNOSIS — L0292 Furuncle, unspecified: Secondary | ICD-10-CM | POA: Diagnosis not present

## 2017-03-07 ENCOUNTER — Ambulatory Visit: Payer: PPO | Admitting: Physical Therapy

## 2017-03-09 ENCOUNTER — Telehealth: Payer: Self-pay

## 2017-03-09 ENCOUNTER — Ambulatory Visit: Payer: PPO

## 2017-03-09 NOTE — Telephone Encounter (Signed)
Spoke with Jeremiah [name] at home and he reports he though his appointment was tomorrow and will be here on the 23 rd for next appointment

## 2017-03-13 ENCOUNTER — Encounter: Payer: Self-pay | Admitting: Physical Therapy

## 2017-03-13 ENCOUNTER — Ambulatory Visit: Payer: PPO | Admitting: Physical Therapy

## 2017-03-13 DIAGNOSIS — M47816 Spondylosis without myelopathy or radiculopathy, lumbar region: Secondary | ICD-10-CM

## 2017-03-13 DIAGNOSIS — M48061 Spinal stenosis, lumbar region without neurogenic claudication: Secondary | ICD-10-CM

## 2017-03-13 DIAGNOSIS — M2560 Stiffness of unspecified joint, not elsewhere classified: Secondary | ICD-10-CM

## 2017-03-13 DIAGNOSIS — M6281 Muscle weakness (generalized): Secondary | ICD-10-CM

## 2017-03-13 DIAGNOSIS — R293 Abnormal posture: Secondary | ICD-10-CM
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
## 2017-03-16 ENCOUNTER — Ambulatory Visit: Payer: PPO

## 2017-03-16 DIAGNOSIS — I10 Essential (primary) hypertension: Secondary | ICD-10-CM | POA: Diagnosis not present

## 2017-03-21 ENCOUNTER — Ambulatory Visit: Payer: PPO

## 2017-03-28 ENCOUNTER — Ambulatory Visit: Payer: PPO | Attending: Physical Medicine and Rehabilitation

## 2017-03-28 DIAGNOSIS — M48061 Spinal stenosis, lumbar region without neurogenic claudication: Secondary | ICD-10-CM | POA: Diagnosis not present

## 2017-03-28 DIAGNOSIS — R293 Abnormal posture: Secondary | ICD-10-CM | POA: Insufficient documentation

## 2017-03-28 DIAGNOSIS — M2560 Stiffness of unspecified joint, not elsewhere classified: Secondary | ICD-10-CM | POA: Diagnosis not present

## 2017-03-28 DIAGNOSIS — M6281 Muscle weakness (generalized): Secondary | ICD-10-CM | POA: Diagnosis not present

## 2017-03-28 DIAGNOSIS — M47816 Spondylosis without myelopathy or radiculopathy, lumbar region: Secondary | ICD-10-CM | POA: Insufficient documentation
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
## 2017-04-03 ENCOUNTER — Encounter: Payer: Self-pay | Admitting: Physical Therapy

## 2017-04-03 ENCOUNTER — Ambulatory Visit: Payer: PPO | Admitting: Physical Therapy

## 2017-04-03 DIAGNOSIS — M2560 Stiffness of unspecified joint, not elsewhere classified: Secondary | ICD-10-CM

## 2017-04-03 DIAGNOSIS — M48061 Spinal stenosis, lumbar region without neurogenic claudication: Secondary | ICD-10-CM

## 2017-04-03 DIAGNOSIS — R293 Abnormal posture: Secondary | ICD-10-CM

## 2017-04-03 DIAGNOSIS — M47816 Spondylosis without myelopathy or radiculopathy, lumbar region: Secondary | ICD-10-CM
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
## 2017-04-04 ENCOUNTER — Encounter: Payer: Self-pay | Admitting: Physical Therapy

## 2017-04-06 ENCOUNTER — Ambulatory Visit: Payer: PPO

## 2017-04-06 DIAGNOSIS — M47817 Spondylosis without myelopathy or radiculopathy, lumbosacral region: Secondary | ICD-10-CM | POA: Diagnosis not present

## 2017-04-06 DIAGNOSIS — M4807 Spinal stenosis, lumbosacral region: Secondary | ICD-10-CM | POA: Diagnosis not present

## 2017-04-06 DIAGNOSIS — G5602 Carpal tunnel syndrome, left upper limb: Secondary | ICD-10-CM | POA: Diagnosis not present

## 2017-04-06 DIAGNOSIS — G894 Chronic pain syndrome: Secondary | ICD-10-CM | POA: Diagnosis not present

## 2017-04-07 ENCOUNTER — Ambulatory Visit: Payer: PPO

## 2017-04-07 DIAGNOSIS — M48061 Spinal stenosis, lumbar region without neurogenic claudication: Secondary | ICD-10-CM | POA: Diagnosis not present

## 2017-04-07 DIAGNOSIS — R293 Abnormal posture: Secondary | ICD-10-CM

## 2017-04-07 DIAGNOSIS — M6281 Muscle weakness (generalized): Secondary | ICD-10-CM

## 2017-04-07 DIAGNOSIS — M47816 Spondylosis without myelopathy or radiculopathy, lumbar region: Secondary | ICD-10-CM

## 2017-04-07 DIAGNOSIS — M2560 Stiffness of unspecified joint, not elsewhere classified: Secondary | ICD-10-CM
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
## 2017-04-11 ENCOUNTER — Ambulatory Visit: Payer: PPO

## 2017-04-13 ENCOUNTER — Ambulatory Visit: Payer: PPO | Admitting: Physical Therapy

## 2017-04-18 ENCOUNTER — Ambulatory Visit: Payer: PPO | Admitting: Physical Therapy

## 2017-05-05 DIAGNOSIS — M4807 Spinal stenosis, lumbosacral region: Secondary | ICD-10-CM | POA: Diagnosis not present

## 2017-05-05 DIAGNOSIS — G5602 Carpal tunnel syndrome, left upper limb: Secondary | ICD-10-CM | POA: Diagnosis not present

## 2017-05-05 DIAGNOSIS — M47817 Spondylosis without myelopathy or radiculopathy, lumbosacral region: Secondary | ICD-10-CM | POA: Diagnosis not present

## 2017-05-05 DIAGNOSIS — G894 Chronic pain syndrome: Secondary | ICD-10-CM | POA: Diagnosis not present

## 2017-06-02 ENCOUNTER — Ambulatory Visit (INDEPENDENT_AMBULATORY_CARE_PROVIDER_SITE_OTHER): Payer: PPO

## 2017-06-02 ENCOUNTER — Ambulatory Visit (INDEPENDENT_AMBULATORY_CARE_PROVIDER_SITE_OTHER): Payer: PPO | Admitting: Orthopaedic Surgery

## 2017-06-02 ENCOUNTER — Encounter (INDEPENDENT_AMBULATORY_CARE_PROVIDER_SITE_OTHER): Payer: Self-pay | Admitting: Orthopaedic Surgery

## 2017-06-02 DIAGNOSIS — M19011 Primary osteoarthritis, right shoulder: Secondary | ICD-10-CM
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
## 2017-06-19 ENCOUNTER — Ambulatory Visit (INDEPENDENT_AMBULATORY_CARE_PROVIDER_SITE_OTHER): Payer: PPO

## 2017-06-19 ENCOUNTER — Ambulatory Visit (INDEPENDENT_AMBULATORY_CARE_PROVIDER_SITE_OTHER): Payer: PPO | Admitting: Physical Medicine and Rehabilitation

## 2017-06-19 ENCOUNTER — Encounter (INDEPENDENT_AMBULATORY_CARE_PROVIDER_SITE_OTHER): Payer: Self-pay | Admitting: Physical Medicine and Rehabilitation

## 2017-06-19 DIAGNOSIS — M25511 Pain in right shoulder: Secondary | ICD-10-CM | POA: Diagnosis not present

## 2017-06-19 DIAGNOSIS — G8929 Other chronic pain: Secondary | ICD-10-CM | POA: Diagnosis not present
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
## 2017-06-19 NOTE — Patient Instructions (Signed)

## 2017-06-19 NOTE — Progress Notes (Deleted)
Right shoulder pain for several years. Difficulty raising his arm. Denies any radiating pain.

## 2017-06-19 NOTE — Progress Notes (Unsigned)
Fluoro Time: 21 sec MGY: 5.73

## 2017-06-20 MED ORDER — TRIAMCINOLONE ACETONIDE 40 MG/ML IJ SUSP
80.0000 mg | INTRAMUSCULAR | Status: AC | PRN
  Administered 2017-06-19: 80 mg via INTRA_ARTICULAR

## 2017-06-20 MED ORDER — BUPIVACAINE HCL 0.5 % IJ SOLN
3.0000 mL | INTRAMUSCULAR | Status: AC | PRN
  Administered 2017-06-19: 3 mL via INTRA_ARTICULAR

## 2017-06-27 DIAGNOSIS — I2510 Atherosclerotic heart disease of native coronary artery without angina pectoris: Secondary | ICD-10-CM | POA: Diagnosis not present

## 2017-06-27 DIAGNOSIS — I10 Essential (primary) hypertension: Secondary | ICD-10-CM | POA: Diagnosis not present

## 2017-06-27 DIAGNOSIS — G4700 Insomnia, unspecified: Secondary | ICD-10-CM | POA: Diagnosis not present

## 2017-06-27 DIAGNOSIS — F329 Major depressive disorder, single episode, unspecified: Secondary | ICD-10-CM | POA: Diagnosis not present

## 2017-06-27 DIAGNOSIS — M129 Arthropathy, unspecified: Secondary | ICD-10-CM | POA: Diagnosis not present

## 2017-06-27 DIAGNOSIS — M109 Gout, unspecified: Secondary | ICD-10-CM | POA: Diagnosis not present

## 2017-06-27 DIAGNOSIS — G8929 Other chronic pain: Secondary | ICD-10-CM | POA: Diagnosis not present

## 2017-06-27 DIAGNOSIS — E7800 Pure hypercholesterolemia, unspecified: Secondary | ICD-10-CM | POA: Diagnosis not present

## 2017-06-27 DIAGNOSIS — Z6834 Body mass index (BMI) 34.0-34.9, adult: Secondary | ICD-10-CM | POA: Diagnosis not present

## 2017-06-30 DIAGNOSIS — M47817 Spondylosis without myelopathy or radiculopathy, lumbosacral region: Secondary | ICD-10-CM | POA: Diagnosis not present

## 2017-06-30 DIAGNOSIS — G894 Chronic pain syndrome: Secondary | ICD-10-CM | POA: Diagnosis not present

## 2017-06-30 DIAGNOSIS — G5602 Carpal tunnel syndrome, left upper limb: Secondary | ICD-10-CM | POA: Diagnosis not present

## 2017-06-30 DIAGNOSIS — M4807 Spinal stenosis, lumbosacral region: Secondary | ICD-10-CM | POA: Diagnosis not present

## 2017-08-01 DIAGNOSIS — M4807 Spinal stenosis, lumbosacral region: Secondary | ICD-10-CM | POA: Diagnosis not present

## 2017-08-01 DIAGNOSIS — M47817 Spondylosis without myelopathy or radiculopathy, lumbosacral region: Secondary | ICD-10-CM | POA: Diagnosis not present

## 2017-08-01 DIAGNOSIS — G894 Chronic pain syndrome: Secondary | ICD-10-CM | POA: Diagnosis not present

## 2017-08-01 DIAGNOSIS — G5602 Carpal tunnel syndrome, left upper limb: Secondary | ICD-10-CM | POA: Diagnosis not present

## 2017-08-03 DIAGNOSIS — Z23 Encounter for immunization: Secondary | ICD-10-CM | POA: Diagnosis not present

## 2017-08-29 DIAGNOSIS — M62830 Muscle spasm of back: Secondary | ICD-10-CM | POA: Diagnosis not present

## 2017-08-29 DIAGNOSIS — Z79891 Long term (current) use of opiate analgesic: Secondary | ICD-10-CM | POA: Diagnosis not present

## 2017-08-29 DIAGNOSIS — F329 Major depressive disorder, single episode, unspecified: Secondary | ICD-10-CM | POA: Diagnosis not present

## 2017-08-29 DIAGNOSIS — G894 Chronic pain syndrome: Secondary | ICD-10-CM | POA: Diagnosis not present

## 2017-08-29 DIAGNOSIS — G4700 Insomnia, unspecified: Secondary | ICD-10-CM | POA: Diagnosis not present

## 2017-08-29 DIAGNOSIS — M4807 Spinal stenosis, lumbosacral region: Secondary | ICD-10-CM | POA: Diagnosis not present

## 2017-08-29 DIAGNOSIS — G5602 Carpal tunnel syndrome, left upper limb: Secondary | ICD-10-CM | POA: Diagnosis not present

## 2017-08-29 DIAGNOSIS — M19012 Primary osteoarthritis, left shoulder: Secondary | ICD-10-CM | POA: Diagnosis not present

## 2017-08-29 DIAGNOSIS — M47817 Spondylosis without myelopathy or radiculopathy, lumbosacral region: Secondary | ICD-10-CM | POA: Diagnosis not present

## 2017-08-29 DIAGNOSIS — G5601 Carpal tunnel syndrome, right upper limb: Secondary | ICD-10-CM | POA: Diagnosis not present
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
## 2017-09-19 ENCOUNTER — Ambulatory Visit (INDEPENDENT_AMBULATORY_CARE_PROVIDER_SITE_OTHER): Payer: PPO | Admitting: Cardiovascular Disease

## 2017-09-19 ENCOUNTER — Encounter: Payer: Self-pay | Admitting: Cardiovascular Disease

## 2017-09-19 VITALS — BP 148/84 | HR 59 | Ht 67.5 in | Wt 215.8 lb

## 2017-09-19 DIAGNOSIS — I10 Essential (primary) hypertension: Secondary | ICD-10-CM

## 2017-09-19 DIAGNOSIS — I25810 Atherosclerosis of coronary artery bypass graft(s) without angina pectoris: Secondary | ICD-10-CM

## 2017-09-19 NOTE — Patient Instructions (Signed)

## 2017-09-28 DIAGNOSIS — M47817 Spondylosis without myelopathy or radiculopathy, lumbosacral region: Secondary | ICD-10-CM | POA: Diagnosis not present

## 2017-09-28 DIAGNOSIS — G894 Chronic pain syndrome: Secondary | ICD-10-CM | POA: Diagnosis not present

## 2017-09-28 DIAGNOSIS — M4807 Spinal stenosis, lumbosacral region: Secondary | ICD-10-CM | POA: Diagnosis not present

## 2017-09-28 DIAGNOSIS — G5602 Carpal tunnel syndrome, left upper limb: Secondary | ICD-10-CM | POA: Diagnosis not present

## 2017-10-03 ENCOUNTER — Telehealth: Payer: Self-pay

## 2017-10-03 NOTE — Telephone Encounter (Signed)
Ok to hold ASA for 7 days prior to surgery

## 2017-10-03 NOTE — Telephone Encounter (Signed)
   Chart reviewed as part of pre-operative protocol coverage. Given past medical history and time since last visit, based on ACC/AHA guidelines, Jeremiah [name] would be at acceptable risk for the planned procedure without further cardiovascular testing.  And per Dr. Johnsie Cancel ok to hold asprin 7 days prior to surgery.    Cecilie Kicks, NP 10/03/2017, 3:20 PM

## 2017-10-03 NOTE — Telephone Encounter (Signed)
   [facility] Pre-operative Risk Assessment    Request for surgical clearance:  1. What type of surgery is being performed? Lumbar MBB L3, 4, 5   2. When is this surgery scheduled? 10/09/17 and 10/27/17   3. Are there any medications that need to be held prior to surgery and how long?ASA for 7 days   4. Practice name and name of physician performing surgery?  [facility]  Interventional and Chronic Pain Management - Dr. Corinna Capra  5. What is your office phone and fax number? Phone [phone] Fax [phone]   6. Anesthesia type (None, local, [name], general) ? Not known   Michaelyn Barter 10/03/2017, 10:33 AM  _________________________________________________________________   (provider comments below)

## 2017-10-03 NOTE — Telephone Encounter (Signed)
Pt is 0.9% risk of major cardiac event with surgery,  will ask Dr.Nishan if ok to hold ASA for 7 days.

## 2017-10-09 DIAGNOSIS — M47817 Spondylosis without myelopathy or radiculopathy, lumbosacral region: Secondary | ICD-10-CM | POA: Diagnosis not present

## 2017-10-27 ENCOUNTER — Encounter (HOSPITAL_COMMUNITY): Payer: Self-pay

## 2017-10-27 ENCOUNTER — Other Ambulatory Visit: Payer: Self-pay

## 2017-10-27 ENCOUNTER — Emergency Department (HOSPITAL_COMMUNITY)
Admission: EM | Admit: 2017-10-27 | Discharge: 2017-10-27 | Disposition: A | Discharge status: Home / Self Care | Payer: PPO | Attending: Emergency Medicine | Admitting: Emergency Medicine

## 2017-10-27 DIAGNOSIS — M47817 Spondylosis without myelopathy or radiculopathy, lumbosacral region: Secondary | ICD-10-CM | POA: Diagnosis not present

## 2017-10-27 DIAGNOSIS — Z5321 Procedure and treatment not carried out due to patient leaving prior to being seen by health care provider: Secondary | ICD-10-CM | POA: Diagnosis not present

## 2017-10-27 DIAGNOSIS — G5602 Carpal tunnel syndrome, left upper limb: Secondary | ICD-10-CM | POA: Diagnosis not present

## 2017-10-27 DIAGNOSIS — G894 Chronic pain syndrome: Secondary | ICD-10-CM | POA: Diagnosis not present

## 2017-10-27 DIAGNOSIS — H538 Other visual disturbances: Secondary | ICD-10-CM | POA: Diagnosis not present

## 2017-10-27 DIAGNOSIS — M4807 Spinal stenosis, lumbosacral region: Secondary | ICD-10-CM | POA: Diagnosis not present

## 2017-10-27 DIAGNOSIS — R51 Headache: Secondary | ICD-10-CM | POA: Insufficient documentation

## 2017-10-27 DIAGNOSIS — R110 Nausea: Secondary | ICD-10-CM | POA: Diagnosis not present

## 2017-10-27 DIAGNOSIS — R42 Dizziness and giddiness: Secondary | ICD-10-CM | POA: Insufficient documentation

## 2017-10-27 NOTE — ED Notes (Signed)
Pt very upset at wait time. States he has a head injury and needs to be seen. This tech apologized and explained the wait time. Pt ripped off fall risk bracelet and pt identifier bracelet and said they were leaving. This tech tried to encouraged him to stay.

## 2017-10-27 NOTE — ED Triage Notes (Signed)
Per Pt, Pt was leaning over painting when he lost his balance and landed on his left head. Pt has abrasion noted to the left head. Reports some dizziness, nausea, slight blurred vision. Denies vomiting or LOC.

## 2017-11-01 ENCOUNTER — Other Ambulatory Visit: Payer: Self-pay

## 2017-11-01 NOTE — Patient Outreach (Signed)
[facility]) Care Management  11/01/2017  Jeremiah [name] 03-Nov-1951 [id number]  1st Telephone call to patient for ED Utilization screening.  No answer.  HIPAA complaint voice message left with contact information.    Plan: RN Health coach will make an outreach attempt to the patient within three business days.  Lazaro Arms RN, BSN, [place] Direct Dial:  [phone] Fax: [phone]

## 2017-11-02 ENCOUNTER — Other Ambulatory Visit: Payer: Self-pay

## 2017-11-02 NOTE — Patient Outreach (Signed)
[facility]) Care Management  11/02/2017  Jeremiah [name] 1951-03-13 [id number]   1st Telephone call to patient for ED Utilization screening.  No answer.  HIPAA complaint voice message left with contact information.    Plan:  RN Health Coach will make an outreach attempt to call the patient within three business days.  Lazaro Arms RN, BSN, [place] Direct Dial:  [phone] Fax: [phone]

## 2017-11-07 ENCOUNTER — Other Ambulatory Visit: Payer: Self-pay

## 2017-11-07 NOTE — Patient Outreach (Signed)
[facility]) Care Management  11/07/2017  Jeremiah [name] [birth year] [id number]   [facility] Care Management criteria for ED census screening calls is 6 ED visits in 6 months. This patient does not meet this criteria, case is being closed.  Plan: RN [facility] will send [facility] assistants closure status.  Lazaro Arms RN, BSN, [place] Direct Dial:  [phone] Fax: [phone]

## 2017-12-21 ENCOUNTER — Telehealth: Payer: Self-pay

## 2017-12-21 NOTE — Telephone Encounter (Signed)
   [facility] Pre-operative Risk Assessment    Request for surgical clearance:  1. What type of surgery is being performed? Lumbar MBB L3, 4, 5   2. When is this surgery scheduled? 01/01/18   3. What type of clearance is required (medical clearance vs. Pharmacy clearance to hold med vs. Both)? Pharmacy clearance    4. Are there any medications that need to be held prior to surgery and how long? Aspirin, 7 days   5. Practice name and name of physician performing surgery? Dr. Stanton Kidney Bodes with [facility]    6. What is your office phone and fax number? Phone [phone] Fax [phone]   7. Anesthesia type (None, local, [name], general) ? N/A   Jeremiah [name] 12/21/2017, 3:46 PM  _________________________________________________________________

## 2017-12-22 NOTE — Telephone Encounter (Signed)
   Primary Cardiologist:Peter Johnsie Cancel, MD  Chart reviewed as part of pre-operative protocol coverage. Previous patient has cleared on 10/03/17 for similar procedure.   Left voice mail to call back between pre-op hours (1:30-5pm).   [place], [place]  12/22/2017, 2:59 PM

## 2017-12-22 NOTE — Telephone Encounter (Signed)
The patient called back. He is doing well on cardiac stand point. No complains.  He takes ASA 325mg  qd. OK to hold for 7 days prior to procedure.   I will route this conversation to requesting provider.   Leanor Kail, PA-c

## 2017-12-27 DIAGNOSIS — G5602 Carpal tunnel syndrome, left upper limb: Secondary | ICD-10-CM | POA: Diagnosis not present

## 2017-12-27 DIAGNOSIS — M47817 Spondylosis without myelopathy or radiculopathy, lumbosacral region: Secondary | ICD-10-CM | POA: Diagnosis not present

## 2017-12-27 DIAGNOSIS — G894 Chronic pain syndrome: Secondary | ICD-10-CM | POA: Diagnosis not present

## 2017-12-27 DIAGNOSIS — M4807 Spinal stenosis, lumbosacral region: Secondary | ICD-10-CM | POA: Diagnosis not present

## 2018-01-01 DIAGNOSIS — M47817 Spondylosis without myelopathy or radiculopathy, lumbosacral region: Secondary | ICD-10-CM | POA: Diagnosis not present

## 2018-01-03 DIAGNOSIS — Z0000 Encounter for general adult medical examination without abnormal findings: Secondary | ICD-10-CM | POA: Diagnosis not present

## 2018-01-03 DIAGNOSIS — M109 Gout, unspecified: Secondary | ICD-10-CM | POA: Diagnosis not present

## 2018-01-03 DIAGNOSIS — G8929 Other chronic pain: Secondary | ICD-10-CM | POA: Diagnosis not present

## 2018-01-03 DIAGNOSIS — F325 Major depressive disorder, single episode, in full remission: Secondary | ICD-10-CM | POA: Diagnosis not present

## 2018-01-03 DIAGNOSIS — G4700 Insomnia, unspecified: Secondary | ICD-10-CM | POA: Diagnosis not present

## 2018-01-03 DIAGNOSIS — M129 Arthropathy, unspecified: Secondary | ICD-10-CM | POA: Diagnosis not present

## 2018-01-03 DIAGNOSIS — Z1389 Encounter for screening for other disorder: Secondary | ICD-10-CM | POA: Diagnosis not present

## 2018-01-03 DIAGNOSIS — I10 Essential (primary) hypertension: Secondary | ICD-10-CM | POA: Diagnosis not present

## 2018-01-03 DIAGNOSIS — I2510 Atherosclerotic heart disease of native coronary artery without angina pectoris: Secondary | ICD-10-CM | POA: Diagnosis not present

## 2018-01-03 DIAGNOSIS — Z125 Encounter for screening for malignant neoplasm of prostate: Secondary | ICD-10-CM | POA: Diagnosis not present

## 2018-01-03 DIAGNOSIS — E7800 Pure hypercholesterolemia, unspecified: Secondary | ICD-10-CM | POA: Diagnosis not present

## 2018-01-12 ENCOUNTER — Telehealth (INDEPENDENT_AMBULATORY_CARE_PROVIDER_SITE_OTHER): Payer: Self-pay | Admitting: Orthopaedic Surgery

## 2018-01-12 NOTE — Telephone Encounter (Signed)
Pt would like to know which Dr. can complete a left shoulder replacement (Total)

## 2018-01-15 NOTE — Telephone Encounter (Signed)
Can you Call patient and let him know Dr. Marlou Sa is the one who will be able to do this.  Thank you.

## 2018-01-15 NOTE — Telephone Encounter (Signed)
dean

## 2018-01-15 NOTE — Telephone Encounter (Signed)
See message.

## 2018-01-15 NOTE — Telephone Encounter (Signed)
Sure will.

## 2018-01-16 ENCOUNTER — Ambulatory Visit (INDEPENDENT_AMBULATORY_CARE_PROVIDER_SITE_OTHER): Payer: PPO | Admitting: Orthopaedic Surgery

## 2018-01-24 ENCOUNTER — Ambulatory Visit (INDEPENDENT_AMBULATORY_CARE_PROVIDER_SITE_OTHER): Payer: PPO | Admitting: Orthopedic Surgery

## 2018-01-24 ENCOUNTER — Encounter (INDEPENDENT_AMBULATORY_CARE_PROVIDER_SITE_OTHER): Payer: Self-pay | Admitting: Orthopedic Surgery

## 2018-01-24 DIAGNOSIS — M47817 Spondylosis without myelopathy or radiculopathy, lumbosacral region: Secondary | ICD-10-CM | POA: Diagnosis not present

## 2018-01-24 DIAGNOSIS — M19012 Primary osteoarthritis, left shoulder: Secondary | ICD-10-CM

## 2018-01-24 DIAGNOSIS — M4807 Spinal stenosis, lumbosacral region: Secondary | ICD-10-CM | POA: Diagnosis not present

## 2018-01-24 DIAGNOSIS — G894 Chronic pain syndrome: Secondary | ICD-10-CM | POA: Diagnosis not present

## 2018-01-24 DIAGNOSIS — G5602 Carpal tunnel syndrome, left upper limb: Secondary | ICD-10-CM | POA: Diagnosis not present

## 2018-01-28 ENCOUNTER — Encounter (INDEPENDENT_AMBULATORY_CARE_PROVIDER_SITE_OTHER): Payer: Self-pay | Admitting: Orthopedic Surgery
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
## 2018-02-01 DIAGNOSIS — R710 Precipitous drop in hematocrit: Secondary | ICD-10-CM | POA: Diagnosis not present

## 2018-02-01 DIAGNOSIS — R944 Abnormal results of kidney function studies: Secondary | ICD-10-CM | POA: Diagnosis not present

## 2018-02-06 ENCOUNTER — Telehealth (INDEPENDENT_AMBULATORY_CARE_PROVIDER_SITE_OTHER): Payer: Self-pay | Admitting: *Deleted

## 2018-02-06 NOTE — Telephone Encounter (Signed)
Pt is scheduled for CT shoulder on Tues Mar 26th at 10am with arrival at 940am at [place]. [place] location, left message on pt vm to return call for appt information.

## 2018-02-07 NOTE — Telephone Encounter (Signed)
Left message on pt vm to return call for appt informatin

## 2018-02-13 ENCOUNTER — Other Ambulatory Visit: Payer: Self-pay

## 2018-02-13 DIAGNOSIS — M47817 Spondylosis without myelopathy or radiculopathy, lumbosacral region: Secondary | ICD-10-CM | POA: Diagnosis not present

## 2018-02-15 ENCOUNTER — Ambulatory Visit
Admission: RE | Admit: 2018-02-15 | Discharge: 2018-02-15 | Disposition: A | Discharge status: Home / Self Care | Payer: PPO | Source: Ambulatory Visit | Attending: Orthopedic Surgery | Admitting: Orthopedic Surgery

## 2018-02-15 ENCOUNTER — Other Ambulatory Visit: Payer: Self-pay

## 2018-02-15 DIAGNOSIS — M19012 Primary osteoarthritis, left shoulder: Secondary | ICD-10-CM | POA: Diagnosis not present

## 2018-02-16 ENCOUNTER — Other Ambulatory Visit (INDEPENDENT_AMBULATORY_CARE_PROVIDER_SITE_OTHER): Payer: Self-pay | Admitting: Orthopedic Surgery

## 2018-02-16 DIAGNOSIS — M12812 Other specific arthropathies, not elsewhere classified, left shoulder: Secondary | ICD-10-CM

## 2018-02-19 ENCOUNTER — Other Ambulatory Visit (INDEPENDENT_AMBULATORY_CARE_PROVIDER_SITE_OTHER): Payer: Self-pay | Admitting: Orthopedic Surgery

## 2018-02-19 NOTE — Pre-Procedure Instructions (Signed)
Jeremiah [name]  02/19/2018      [facility] - [address], [place] [address] Phone: [phone] Fax: [phone]    Your procedure is scheduled on April 9  Report to [facility] at 1015 A.M.  Call this number if you have problems the morning of surgery:  [phone]   Remember:  Do not eat food or drink liquids after midnight.  Continue all medications as directed by your physician except follow these medication instructions before surgery below   Take these medicines the morning of surgery with A SIP OF WATER  allopurinol (ZYLOPRIM)  buPROPion (WELLBUTRIN XL) FLUoxetine (PROZAC)  oxyCODONE-acetaminophen (PERCOCET)  7 days prior to surgery STOP taking any Aspirin(unless otherwise instructed by your surgeon), Aleve, Naproxen, Ibuprofen, Motrin, Advil, Goody's, BC's, all herbal medications, fish oil, and all vitamins    Do not wear jewelry  Do not wear lotions, powders, or cologne, or deodorant.   Men may shave face and neck.  Do not bring valuables to the hospital.  [facility] is not responsible for any belongings or valuables.  Contacts, dentures or bridgework may not be worn into surgery.  Leave your suitcase in the car.  After surgery it may be brought to your room.  For patients admitted to the hospital, discharge time will be determined by your treatment team.  Patients discharged the day of surgery will not be allowed to drive home.    Special instructions:   - Preparing For Surgery  Before surgery, you can play an important role. Because skin is not sterile, your skin needs to be as free of germs as possible. You can reduce the number of germs on your skin by washing with CHG (chlorahexidine gluconate) Soap before surgery.  CHG is an antiseptic cleaner which kills germs and bonds with the skin to continue killing germs even  after washing.  Please do not use if you have an allergy to CHG or antibacterial soaps. If your skin becomes reddened/irritated stop using the CHG.  Do not shave (including legs and underarms) for at least 48 hours prior to first CHG shower. It is OK to shave your face.  Please follow these instructions carefully.   1. Shower the NIGHT BEFORE SURGERY and the MORNING OF SURGERY with CHG.   2. If you chose to wash your hair, wash your hair first as usual with your normal shampoo.  3. After you shampoo, rinse your hair and body thoroughly to remove the shampoo.  4. Use CHG as you would any other liquid soap. You can apply CHG directly to the skin and wash gently with a scrungie or a clean washcloth.   5. Apply the CHG Soap to your body ONLY FROM THE NECK DOWN.  Do not use on open wounds or open sores. Avoid contact with your eyes, ears, mouth and genitals (private parts). Wash Face and genitals (private parts)  with your normal soap.  6. Wash thoroughly, paying special attention to the area where your surgery will be performed.  7. Thoroughly rinse your body with warm water from the neck down.  8. DO NOT shower/wash with your normal soap after using and rinsing off the CHG Soap.  9. Pat yourself dry with a CLEAN TOWEL.  10. Wear CLEAN PAJAMAS to bed the night before surgery, wear comfortable clothes the morning of surgery  11.  Place CLEAN SHEETS on your bed the night of your first shower and DO NOT SLEEP WITH PETS.    Day of Surgery: Do not apply any deodorants/lotions. Please wear clean clothes to the hospital/surgery center.      Please read over the following fact sheets that you were given.

## 2018-02-20 ENCOUNTER — Encounter (HOSPITAL_COMMUNITY): Payer: Self-pay

## 2018-02-20 ENCOUNTER — Encounter (HOSPITAL_COMMUNITY)
Admission: RE | Admit: 2018-02-20 | Discharge: 2018-02-20 | Disposition: A | Discharge status: Home / Self Care | Payer: PPO | Source: Ambulatory Visit | Attending: Orthopedic Surgery | Admitting: Orthopedic Surgery

## 2018-02-20 ENCOUNTER — Other Ambulatory Visit: Payer: Self-pay

## 2018-02-20 DIAGNOSIS — M19012 Primary osteoarthritis, left shoulder: Secondary | ICD-10-CM | POA: Insufficient documentation

## 2018-02-20 DIAGNOSIS — Z01818 Encounter for other preprocedural examination: Secondary | ICD-10-CM | POA: Insufficient documentation

## 2018-02-20 HISTORY — DX: Headache: R51

## 2018-02-20 HISTORY — DX: Headache, unspecified: R51.9

## 2018-02-20 LAB — BASIC METABOLIC PANEL
Anion gap: 9 (ref 5–15)
BUN: 20 mg/dL (ref 6–20)
CALCIUM: 9.3 mg/dL (ref 8.9–10.3)
CHLORIDE: 109 mmol/L (ref 101–111)
CO2: 21 mmol/L — ABNORMAL LOW (ref 22–32)
CREATININE: 0.97 mg/dL (ref 0.61–1.24)
GFR calc non Af Amer: >60 mL/min (ref >60)
Glucose, Bld: 91 mg/dL (ref 65–99)
Potassium: 3.9 mmol/L (ref 3.5–5.1)
SODIUM: 139 mmol/L (ref 135–145)

## 2018-02-20 LAB — URINALYSIS, ROUTINE W REFLEX MICROSCOPIC
Bilirubin Urine: NEGATIVE
GLUCOSE, UA: NEGATIVE mg/dL
HGB URINE DIPSTICK: NEGATIVE
Ketones, ur: NEGATIVE mg/dL
Leukocytes, UA: NEGATIVE
Nitrite: NEGATIVE
PROTEIN: NEGATIVE mg/dL
Specific Gravity, Urine: 1.019 (ref 1.005–1.030)
pH: 5 (ref 5.0–8.0)

## 2018-02-20 LAB — CBC
HCT: 41 % (ref 39.0–52.0)
Hemoglobin: 13 g/dL (ref 13.0–17.0)
MCH: 29.4 pg (ref 26.0–34.0)
MCHC: 31.7 g/dL (ref 30.0–36.0)
MCV: 92.8 fL (ref 78.0–100.0)
PLATELETS: 244 10*3/uL (ref 150–400)
RBC: 4.42 MIL/uL (ref 4.22–5.81)
RDW: 14.2 % (ref 11.5–15.5)
WBC: 7.5 10*3/uL (ref 4.0–10.5)

## 2018-02-20 LAB — TYPE AND SCREEN
ABO/RH(D): A POS
Antibody Screen: NEGATIVE

## 2018-02-20 LAB — SURGICAL PCR SCREEN
MRSA, PCR: NEGATIVE
Staphylococcus aureus: POSITIVE — AB

## 2018-02-20 LAB — ABO/RH: ABO/RH(D): A POS

## 2018-02-20 NOTE — Progress Notes (Signed)
   02/20/18 1140  OBSTRUCTIVE SLEEP APNEA  Score 5 or greater  Results sent to PCP

## 2018-02-21 DIAGNOSIS — M4807 Spinal stenosis, lumbosacral region: Secondary | ICD-10-CM | POA: Diagnosis not present

## 2018-02-21 DIAGNOSIS — G5602 Carpal tunnel syndrome, left upper limb: Secondary | ICD-10-CM | POA: Diagnosis not present

## 2018-02-21 DIAGNOSIS — M47817 Spondylosis without myelopathy or radiculopathy, lumbosacral region: Secondary | ICD-10-CM | POA: Diagnosis not present

## 2018-02-21 DIAGNOSIS — G894 Chronic pain syndrome: Secondary | ICD-10-CM | POA: Diagnosis not present

## 2018-02-21 LAB — URINE CULTURE

## 2018-02-21 NOTE — Progress Notes (Signed)
Anesthesia Chart Review:  Pt is a 67 year old male scheduled for L reverse shoulder replacement on 02/27/2018 with Meredith Pel, MD  - PCP is Carol Ada, MD - Cardiologist is Jenkins Rouge, MD. Last office visit 09/19/17.    PMH includes:  CAD (s/p CABG 1998), HTN, RBBB, LAFB,  hyperlipidemia, GERD. Never smoker. BMI 34.5. S/p R TKA 07/23/13. S/p gastric bypass surgery.   Medications include: ASA 325 mg, labetalol, lisinopril, simvastatin. Cheryl in Dr. Randel Pigg office will contact pt with perioperative instructions for ASA   BP 111/70   Pulse 70   Temp 36.9 C (Oral)   Resp 20   Ht 5\' 7"  (1.702 m)   Wt 221 lb 3 oz (100.3 kg)   SpO2 97%   BMI 34.64 kg/m   Preoperative labs reviewed.    EKG 09/19/17: Sinus bradycardia (59 bpm) with 1st degree AV block with PACs. RBBB. LAFB. T wave abnormality, consider inferolateral ischemia.   Nuclear stress test 07/15/13:  - Normal stress nuclear study. - LV Ejection Fraction: 54%.  LV Wall Motion:  NL LV Function; NL Wall Motion  If no changes, I anticipate pt can proceed with surgery as scheduled.   Willeen Cass, FNP-BC [facility] Short Stay Surgical Center/Anesthesiology Phone: [phone] 02/21/2018 3:32 PM

## 2018-02-23 ENCOUNTER — Telehealth (INDEPENDENT_AMBULATORY_CARE_PROVIDER_SITE_OTHER): Payer: Self-pay | Admitting: Orthopedic Surgery

## 2018-02-23 NOTE — Telephone Encounter (Signed)
Dr.Bobeau  [facility]   Oxycodone  4 to 6 to get to 03/09/18   Pt pain management doctor called to notify the office about dosage of his med being raised until surgery

## 2018-02-23 NOTE — Telephone Encounter (Signed)
Regrettable action on his part

## 2018-02-23 NOTE — Telephone Encounter (Signed)
FYI

## 2018-02-23 NOTE — Telephone Encounter (Signed)
I have been calling Jeremiah [name] and leaving messages for the past few days.  We keep missing each other and playing phone tag.  I have called multiple times today and he did not answer. I ended up leaving a detailed message on his voicemail advising him to please stop taking his 325 mg aspirin prior to his reverse shoulder replacement that is scheduled for Tuesday 02/27/2018.
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
## 2018-02-27 ENCOUNTER — Inpatient Hospital Stay (HOSPITAL_COMMUNITY): Payer: PPO | Admitting: Emergency Medicine

## 2018-02-27 ENCOUNTER — Other Ambulatory Visit: Payer: Self-pay

## 2018-02-27 ENCOUNTER — Encounter (HOSPITAL_COMMUNITY)
Admission: RE | Disposition: A | Discharge status: Home / Self Care | Payer: Self-pay | Source: Ambulatory Visit | Attending: Orthopedic Surgery

## 2018-02-27 ENCOUNTER — Inpatient Hospital Stay (HOSPITAL_COMMUNITY): Payer: PPO | Admitting: Anesthesiology

## 2018-02-27 ENCOUNTER — Inpatient Hospital Stay (HOSPITAL_COMMUNITY)
Admission: RE | Admit: 2018-02-27 | Discharge: 2018-02-28 | DRG: 483 | Disposition: A | Discharge status: Home / Self Care | Payer: PPO | Source: Ambulatory Visit | Attending: Orthopedic Surgery | Admitting: Orthopedic Surgery

## 2018-02-27 ENCOUNTER — Inpatient Hospital Stay (HOSPITAL_COMMUNITY): Payer: PPO

## 2018-02-27 ENCOUNTER — Encounter (HOSPITAL_COMMUNITY): Payer: Self-pay | Admitting: *Deleted

## 2018-02-27 DIAGNOSIS — Z9884 Bariatric surgery status: Secondary | ICD-10-CM | POA: Diagnosis not present

## 2018-02-27 DIAGNOSIS — Z96653 Presence of artificial knee joint, bilateral: Secondary | ICD-10-CM | POA: Diagnosis present

## 2018-02-27 DIAGNOSIS — Z8249 Family history of ischemic heart disease and other diseases of the circulatory system: Secondary | ICD-10-CM

## 2018-02-27 DIAGNOSIS — M75122 Complete rotator cuff tear or rupture of left shoulder, not specified as traumatic: Secondary | ICD-10-CM | POA: Diagnosis not present

## 2018-02-27 DIAGNOSIS — G8918 Other acute postprocedural pain: Secondary | ICD-10-CM | POA: Diagnosis not present

## 2018-02-27 DIAGNOSIS — Z9842 Cataract extraction status, left eye: Secondary | ICD-10-CM

## 2018-02-27 DIAGNOSIS — M19012 Primary osteoarthritis, left shoulder: Principal | ICD-10-CM | POA: Diagnosis present

## 2018-02-27 DIAGNOSIS — Z951 Presence of aortocoronary bypass graft: Secondary | ICD-10-CM | POA: Diagnosis not present

## 2018-02-27 DIAGNOSIS — Z9841 Cataract extraction status, right eye: Secondary | ICD-10-CM

## 2018-02-27 DIAGNOSIS — M12812 Other specific arthropathies, not elsewhere classified, left shoulder: Secondary | ICD-10-CM

## 2018-02-27 DIAGNOSIS — I10 Essential (primary) hypertension: Secondary | ICD-10-CM | POA: Diagnosis present

## 2018-02-27 DIAGNOSIS — Z9049 Acquired absence of other specified parts of digestive tract: Secondary | ICD-10-CM | POA: Diagnosis not present

## 2018-02-27 DIAGNOSIS — I2510 Atherosclerotic heart disease of native coronary artery without angina pectoris: Secondary | ICD-10-CM | POA: Diagnosis not present

## 2018-02-27 DIAGNOSIS — E7800 Pure hypercholesterolemia, unspecified: Secondary | ICD-10-CM | POA: Diagnosis present

## 2018-02-27 DIAGNOSIS — Z96612 Presence of left artificial shoulder joint: Secondary | ICD-10-CM | POA: Diagnosis not present

## 2018-02-27 DIAGNOSIS — M19019 Primary osteoarthritis, unspecified shoulder: Secondary | ICD-10-CM | POA: Diagnosis present

## 2018-02-27 DIAGNOSIS — Z471 Aftercare following joint replacement surgery: Secondary | ICD-10-CM | POA: Diagnosis not present

## 2018-02-27 HISTORY — PX: REVERSE SHOULDER ARTHROPLASTY: SHX5054

## 2018-02-27 SURGERY — ARTHROPLASTY, SHOULDER, TOTAL, REVERSE
Anesthesia: General | Site: Shoulder | Laterality: Left

## 2018-02-27 MED ORDER — LACTATED RINGERS IV SOLN
INTRAVENOUS | Status: DC
  Administered 2018-02-27 (×2): via INTRAVENOUS

## 2018-02-27 MED ORDER — DOCUSATE SODIUM 100 MG PO CAPS
100.0000 mg | ORAL_CAPSULE | Freq: Two times a day (BID) | ORAL | Status: DC
  Administered 2018-02-27 – 2018-02-28 (×2): 100 mg via ORAL
  Filled 2018-02-27 (×2): qty 1

## 2018-02-27 MED ORDER — PHENOL 1.4 % MT LIQD: 1.0000 | OROMUCOSAL | Status: DC | PRN

## 2018-02-27 MED ORDER — MIDAZOLAM HCL 2 MG/2ML IJ SOLN
INTRAMUSCULAR | Status: AC
Start: 2018-02-27 — End: 2018-02-27
  Filled 2018-02-27: qty 2

## 2018-02-27 MED ORDER — LABETALOL HCL 100 MG PO TABS
100.0000 mg | ORAL_TABLET | Freq: Every day | ORAL | Status: DC
  Administered 2018-02-28: 100 mg via ORAL
  Filled 2018-02-27: qty 1

## 2018-02-27 MED ORDER — FENTANYL CITRATE (PF) 100 MCG/2ML IJ SOLN
50.0000 ug | Freq: Once | INTRAMUSCULAR | Status: AC
  Administered 2018-02-27: 50 ug via INTRAVENOUS

## 2018-02-27 MED ORDER — OXYCODONE HCL 5 MG PO TABS
ORAL_TABLET | ORAL | Status: AC
  Filled 2018-02-27: qty 2

## 2018-02-27 MED ORDER — METOCLOPRAMIDE HCL 5 MG/ML IJ SOLN: 5.0000 mg | Freq: Three times a day (TID) | INTRAMUSCULAR | Status: DC | PRN

## 2018-02-27 MED ORDER — METOCLOPRAMIDE HCL 5 MG PO TABS: 5.0000 mg | ORAL_TABLET | Freq: Three times a day (TID) | ORAL | Status: DC | PRN

## 2018-02-27 MED ORDER — PHENYLEPHRINE HCL 10 MG/ML IJ SOLN
INTRAVENOUS | Status: DC | PRN
  Administered 2018-02-27: 30 ug/min via INTRAVENOUS

## 2018-02-27 MED ORDER — ONDANSETRON HCL 4 MG PO TABS: 4.0000 mg | ORAL_TABLET | Freq: Four times a day (QID) | ORAL | Status: DC | PRN

## 2018-02-27 MED ORDER — FENTANYL CITRATE (PF) 100 MCG/2ML IJ SOLN
INTRAMUSCULAR | Status: AC
  Filled 2018-02-27: qty 2

## 2018-02-27 MED ORDER — OXYCODONE HCL 5 MG/5ML PO SOLN
5.0000 mg | Freq: Once | ORAL | Status: DC | PRN
Start: 2018-02-27 — End: 2018-02-27

## 2018-02-27 MED ORDER — CHLORHEXIDINE GLUCONATE 4 % EX LIQD: 60.0000 mL | Freq: Once | CUTANEOUS | Status: DC

## 2018-02-27 MED ORDER — ALPRAZOLAM 0.5 MG PO TABS
1.0000 mg | ORAL_TABLET | Freq: Three times a day (TID) | ORAL | Status: DC
  Administered 2018-02-27 – 2018-02-28 (×2): 1 mg via ORAL
  Filled 2018-02-27 (×2): qty 2

## 2018-02-27 MED ORDER — VITAMIN B-12 1000 MCG PO TABS
1000.0000 ug | ORAL_TABLET | Freq: Every day | ORAL | Status: DC
  Administered 2018-02-27 – 2018-02-28 (×2): 1000 ug via ORAL
  Filled 2018-02-27 (×2): qty 1

## 2018-02-27 MED ORDER — ACETAMINOPHEN 500 MG PO TABS
1000.0000 mg | ORAL_TABLET | Freq: Four times a day (QID) | ORAL | Status: DC
  Administered 2018-02-27 – 2018-02-28 (×2): 1000 mg via ORAL
  Filled 2018-02-27 (×2): qty 2

## 2018-02-27 MED ORDER — ACETAMINOPHEN 325 MG PO TABS
ORAL_TABLET | ORAL | Status: AC
  Administered 2018-02-27: 325 mg via ORAL
  Filled 2018-02-27: qty 1

## 2018-02-27 MED ORDER — ONDANSETRON HCL 4 MG/2ML IJ SOLN: 4.0000 mg | Freq: Once | INTRAMUSCULAR | Status: DC | PRN

## 2018-02-27 MED ORDER — HYDROMORPHONE HCL 1 MG/ML IJ SOLN
0.5000 mg | INTRAMUSCULAR | Status: DC | PRN
  Administered 2018-02-27: 1 mg via INTRAVENOUS
  Filled 2018-02-27: qty 1

## 2018-02-27 MED ORDER — MIDAZOLAM HCL 2 MG/2ML IJ SOLN
1.0000 mg | Freq: Once | INTRAMUSCULAR | Status: AC
  Administered 2018-02-27: 1 mg via INTRAVENOUS

## 2018-02-27 MED ORDER — DEXAMETHASONE SODIUM PHOSPHATE 10 MG/ML IJ SOLN
INTRAMUSCULAR | Status: AC
  Filled 2018-02-27: qty 1

## 2018-02-27 MED ORDER — FENTANYL CITRATE (PF) 100 MCG/2ML IJ SOLN: 25.0000 ug | INTRAMUSCULAR | Status: DC | PRN

## 2018-02-27 MED ORDER — ROCURONIUM BROMIDE 100 MG/10ML IV SOLN
INTRAVENOUS | Status: DC | PRN
  Administered 2018-02-27: 50 mg via INTRAVENOUS

## 2018-02-27 MED ORDER — SIMVASTATIN 40 MG PO TABS
40.0000 mg | ORAL_TABLET | Freq: Every evening | ORAL | Status: DC
  Administered 2018-02-27: 40 mg via ORAL
  Filled 2018-02-27: qty 1

## 2018-02-27 MED ORDER — LACTATED RINGERS IV SOLN
INTRAVENOUS | Status: AC
  Administered 2018-02-27: 23:00:00 via INTRAVENOUS

## 2018-02-27 MED ORDER — LORATADINE 10 MG PO TABS
10.0000 mg | ORAL_TABLET | Freq: Every day | ORAL | Status: DC
  Administered 2018-02-27 – 2018-02-28 (×2): 10 mg via ORAL
  Filled 2018-02-27 (×2): qty 1

## 2018-02-27 MED ORDER — ADULT MULTIVITAMIN W/MINERALS CH
1.0000 | ORAL_TABLET | Freq: Every day | ORAL | Status: DC
  Administered 2018-02-28: 1 via ORAL
  Filled 2018-02-27: qty 1

## 2018-02-27 MED ORDER — ASPIRIN 325 MG PO TABS
325.0000 mg | ORAL_TABLET | Freq: Every day | ORAL | Status: DC
  Administered 2018-02-28: 325 mg via ORAL
  Filled 2018-02-27: qty 1

## 2018-02-27 MED ORDER — 0.9 % SODIUM CHLORIDE (POUR BTL) OPTIME
TOPICAL | Status: DC | PRN
  Administered 2018-02-27 (×4): 1000 mL

## 2018-02-27 MED ORDER — ALBUMIN HUMAN 5 % IV SOLN
INTRAVENOUS | Status: DC | PRN
  Administered 2018-02-27: 17:00:00 via INTRAVENOUS

## 2018-02-27 MED ORDER — EPHEDRINE SULFATE 50 MG/ML IJ SOLN
INTRAMUSCULAR | Status: DC | PRN
  Administered 2018-02-27: 50 mg via INTRAVENOUS
  Administered 2018-02-27: 15 mg via INTRAVENOUS

## 2018-02-27 MED ORDER — LISINOPRIL 10 MG PO TABS
10.0000 mg | ORAL_TABLET | Freq: Every day | ORAL | Status: DC
  Administered 2018-02-28: 10 mg via ORAL
  Filled 2018-02-27: qty 1

## 2018-02-27 MED ORDER — FENTANYL CITRATE (PF) 250 MCG/5ML IJ SOLN
INTRAMUSCULAR | Status: AC
Start: 2018-02-27 — End: 2018-02-27
  Filled 2018-02-27: qty 5

## 2018-02-27 MED ORDER — ACETAMINOPHEN 325 MG PO TABS
325.0000 mg | ORAL_TABLET | Freq: Once | ORAL | Status: AC
  Administered 2018-02-27: 325 mg via ORAL

## 2018-02-27 MED ORDER — SUGAMMADEX SODIUM 200 MG/2ML IV SOLN
INTRAVENOUS | Status: AC
  Filled 2018-02-27: qty 2

## 2018-02-27 MED ORDER — CEFAZOLIN SODIUM-DEXTROSE 2-4 GM/100ML-% IV SOLN
2.0000 g | INTRAVENOUS | Status: AC
  Administered 2018-02-27: 2 g via INTRAVENOUS
  Filled 2018-02-27: qty 100

## 2018-02-27 MED ORDER — PROPOFOL 10 MG/ML IV BOLUS
INTRAVENOUS | Status: AC
  Filled 2018-02-27: qty 20

## 2018-02-27 MED ORDER — FENTANYL CITRATE (PF) 100 MCG/2ML IJ SOLN
INTRAMUSCULAR | Status: DC | PRN
  Administered 2018-02-27 (×2): 50 ug via INTRAVENOUS

## 2018-02-27 MED ORDER — MENTHOL 3 MG MT LOZG: 1.0000 | LOZENGE | OROMUCOSAL | Status: DC | PRN

## 2018-02-27 MED ORDER — PROPOFOL 10 MG/ML IV BOLUS
INTRAVENOUS | Status: DC | PRN
  Administered 2018-02-27: 140 mg via INTRAVENOUS

## 2018-02-27 MED ORDER — LEVOCETIRIZINE DIHYDROCHLORIDE 5 MG PO TABS: 5.0000 mg | ORAL_TABLET | Freq: Every evening | ORAL | Status: DC

## 2018-02-27 MED ORDER — PHENYLEPHRINE HCL 10 MG/ML IJ SOLN
INTRAMUSCULAR | Status: DC | PRN
  Administered 2018-02-27: 80 ug via INTRAVENOUS
  Administered 2018-02-27: 120 ug via INTRAVENOUS

## 2018-02-27 MED ORDER — DEXAMETHASONE SODIUM PHOSPHATE 10 MG/ML IJ SOLN
INTRAMUSCULAR | Status: DC | PRN
  Administered 2018-02-27: 10 mg via INTRAVENOUS

## 2018-02-27 MED ORDER — ALLOPURINOL 100 MG PO TABS
100.0000 mg | ORAL_TABLET | Freq: Two times a day (BID) | ORAL | Status: DC
  Administered 2018-02-27 – 2018-02-28 (×2): 100 mg via ORAL
  Filled 2018-02-27 (×2): qty 1

## 2018-02-27 MED ORDER — LIDOCAINE HCL (CARDIAC) 20 MG/ML IV SOLN
INTRAVENOUS | Status: DC | PRN
  Administered 2018-02-27: 20 mg via INTRAVENOUS

## 2018-02-27 MED ORDER — SUGAMMADEX SODIUM 200 MG/2ML IV SOLN
INTRAVENOUS | Status: DC | PRN
  Administered 2018-02-27: 210 mg via INTRAVENOUS

## 2018-02-27 MED ORDER — GUMMI BEAR MULTIVITAMIN/MIN PO CHEW: CHEWABLE_TABLET | Freq: Every day | ORAL | Status: DC

## 2018-02-27 MED ORDER — VANCOMYCIN HCL 1000 MG IV SOLR
INTRAVENOUS | Status: AC
  Filled 2018-02-27: qty 1000

## 2018-02-27 MED ORDER — ONDANSETRON HCL 4 MG/2ML IJ SOLN
INTRAMUSCULAR | Status: DC | PRN
  Administered 2018-02-27: 4 mg via INTRAVENOUS

## 2018-02-27 MED ORDER — VANCOMYCIN HCL 1000 MG IV SOLR
INTRAVENOUS | Status: DC | PRN
  Administered 2018-02-27: 1000 mg

## 2018-02-27 MED ORDER — LIDOCAINE 2% (20 MG/ML) 5 ML SYRINGE
INTRAMUSCULAR | Status: AC
  Filled 2018-02-27: qty 5

## 2018-02-27 MED ORDER — CEFAZOLIN SODIUM-DEXTROSE 2-4 GM/100ML-% IV SOLN
2.0000 g | Freq: Four times a day (QID) | INTRAVENOUS | Status: AC
  Administered 2018-02-27 – 2018-02-28 (×2): 2 g via INTRAVENOUS
  Filled 2018-02-27 (×2): qty 100

## 2018-02-27 MED ORDER — ZOLPIDEM TARTRATE 5 MG PO TABS
5.0000 mg | ORAL_TABLET | Freq: Every day | ORAL | Status: DC
  Administered 2018-02-27: 5 mg via ORAL
  Filled 2018-02-27: qty 1

## 2018-02-27 MED ORDER — OXYCODONE HCL 5 MG PO TABS: 5.0000 mg | ORAL_TABLET | Freq: Once | ORAL | Status: DC | PRN

## 2018-02-27 MED ORDER — LACTATED RINGERS IV SOLN: INTRAVENOUS | Status: DC

## 2018-02-27 MED ORDER — MIDAZOLAM HCL 2 MG/2ML IJ SOLN
INTRAMUSCULAR | Status: AC
Start: 2018-02-27 — End: 2018-02-28
  Filled 2018-02-27: qty 2

## 2018-02-27 MED ORDER — BUPROPION HCL ER (XL) 150 MG PO TB24
300.0000 mg | ORAL_TABLET | Freq: Every morning | ORAL | Status: DC
  Administered 2018-02-28: 300 mg via ORAL
  Filled 2018-02-27: qty 2

## 2018-02-27 MED ORDER — OXYCODONE HCL 5 MG PO TABS
5.0000 mg | ORAL_TABLET | ORAL | Status: DC | PRN
  Administered 2018-02-27 – 2018-02-28 (×4): 10 mg via ORAL
  Filled 2018-02-27 (×3): qty 2

## 2018-02-27 MED ORDER — FLUOXETINE HCL 20 MG PO CAPS
40.0000 mg | ORAL_CAPSULE | Freq: Every day | ORAL | Status: DC
  Administered 2018-02-28: 40 mg via ORAL
  Filled 2018-02-27: qty 2

## 2018-02-27 MED ORDER — TIZANIDINE HCL 4 MG PO TABS
2.0000 mg | ORAL_TABLET | Freq: Two times a day (BID) | ORAL | Status: DC
  Administered 2018-02-27 – 2018-02-28 (×2): 2 mg via ORAL
  Filled 2018-02-27 (×2): qty 1

## 2018-02-27 MED ORDER — ONDANSETRON HCL 4 MG/2ML IJ SOLN
INTRAMUSCULAR | Status: AC
  Filled 2018-02-27: qty 2

## 2018-02-27 MED ORDER — ONDANSETRON HCL 4 MG/2ML IJ SOLN: 4.0000 mg | Freq: Four times a day (QID) | INTRAMUSCULAR | Status: DC | PRN

## 2018-02-27 SURGICAL SUPPLY — 69 items
ALCOHOL 70% 16 OZ (MISCELLANEOUS) ×2 IMPLANT
BLADE SAW SGTL 13X75X1.27 (BLADE) ×2 IMPLANT
BSPLAT GLND +2X24 MDLR (Joint) ×2 IMPLANT
COVER SURGICAL LIGHT HANDLE (MISCELLANEOUS) ×2 IMPLANT
CUP SUT UNIV REVERS 39 NEU (Shoulder) ×1 IMPLANT
DRAPE INCISE IOBAN 66X45 STRL (DRAPES) ×4 IMPLANT
DRAPE U-SHAPE 47X51 STRL (DRAPES) ×4 IMPLANT
DRSG AQUACEL AG ADV 3.5X10 (GAUZE/BANDAGES/DRESSINGS) ×2 IMPLANT
DURAPREP 26ML APPLICATOR (WOUND CARE) ×3 IMPLANT
ELECT REM PT RETURN 9FT ADLT (ELECTROSURGICAL) ×2
ELECTRODE REM PT RTRN 9FT ADLT (ELECTROSURGICAL) ×1 IMPLANT
GLENOID UNI REV MOD 24 +2 LAT (Joint) ×2 IMPLANT
GLENOSPHERE 39+4 LAT/24 UNI RV (Joint) ×1 IMPLANT
GLOVE BIOGEL PI IND STRL 6.5 (GLOVE) IMPLANT
GLOVE BIOGEL PI IND STRL 7.5 (GLOVE) ×1 IMPLANT
GLOVE BIOGEL PI IND STRL 8 (GLOVE) ×1 IMPLANT
GLOVE BIOGEL PI INDICATOR 6.5 (GLOVE) ×1
GLOVE BIOGEL PI INDICATOR 7.5 (GLOVE) ×1
GLOVE BIOGEL PI INDICATOR 8 (GLOVE) ×1
GLOVE ECLIPSE 7.0 STRL STRAW (GLOVE) ×2 IMPLANT
GLOVE SURG ORTHO 8.0 STRL STRW (GLOVE) ×2 IMPLANT
GLOVE SURG SS PI 6.5 STRL IVOR (GLOVE) ×2 IMPLANT
GOWN STRL REUS W/ TWL LRG LVL3 (GOWN DISPOSABLE) ×2 IMPLANT
GOWN STRL REUS W/ TWL XL LVL3 (GOWN DISPOSABLE) IMPLANT
GOWN STRL REUS W/TWL LRG LVL3 (GOWN DISPOSABLE) ×6
GOWN STRL REUS W/TWL XL LVL3 (GOWN DISPOSABLE)
HANDPIECE INTERPULSE COAX TIP (DISPOSABLE) ×2
HYDROGEN PEROXIDE 16OZ (MISCELLANEOUS) ×2 IMPLANT
INSERT HUMERAL 39/+6 (Insert) ×1 IMPLANT
KIT BASIN OR (CUSTOM PROCEDURE TRAY) ×2 IMPLANT
KIT TURNOVER KIT B (KITS) ×2 IMPLANT
LOOP VESSEL MAXI BLUE (MISCELLANEOUS) IMPLANT
MANIFOLD NEPTUNE II (INSTRUMENTS) ×2 IMPLANT
NDL SUT 6 .5 CRC .975X.05 MAYO (NEEDLE) ×1 IMPLANT
NEEDLE MAYO TAPER (NEEDLE) ×2
NS IRRIG 1000ML POUR BTL (IV SOLUTION) ×3 IMPLANT
PACK SHOULDER (CUSTOM PROCEDURE TRAY) ×2 IMPLANT
PAD ARMBOARD 7.5X6 YLW CONV (MISCELLANEOUS) ×4 IMPLANT
PASSER SUT SWANSON 36MM LOOP (INSTRUMENTS) ×2 IMPLANT
PIN SET MODULAR GLENOID SYSTEM (PIN) ×1 IMPLANT
SCREW CENTRAL MOD 35 (Screw) ×1 IMPLANT
SCREW CENTRAL MODULAR 25 (Screw) ×1 IMPLANT
SCREW PERI LOCK 5.5X16 (Screw) ×1 IMPLANT
SCREW PERI LOCK 5.5X24 (Screw) ×2 IMPLANT
SCREW PERIPHERAL 5.5X20 LOCK (Screw) ×1 IMPLANT
SCRUB BETADINE 4OZ XXX (MISCELLANEOUS) ×2 IMPLANT
SET HNDPC FAN SPRY TIP SCT (DISPOSABLE) ×1 IMPLANT
SLING ARM IMMOBILIZER LRG (SOFTGOODS) ×2 IMPLANT
SLING ARM IMMOBILIZER XL (CAST SUPPLIES) ×1 IMPLANT
SPONGE LAP 18X18 X RAY DECT (DISPOSABLE) ×2 IMPLANT
STEM HUMERAL UNI REV SIZE 12 (Stem) ×1 IMPLANT
STRIP CLOSURE SKIN 1/2X4 (GAUZE/BANDAGES/DRESSINGS) ×1 IMPLANT
SUCTION FRAZIER HANDLE 10FR (MISCELLANEOUS) ×1
SUCTION TUBE FRAZIER 10FR DISP (MISCELLANEOUS) ×1 IMPLANT
SUT FIBERWIRE #2 38 T-5 BLUE (SUTURE) ×6
SUT MAXBRAID (SUTURE) IMPLANT
SUT MNCRL AB 3-0 PS2 18 (SUTURE) ×3 IMPLANT
SUT SILK 3 0 TIES 10X30 (SUTURE) ×2 IMPLANT
SUT VIC AB 0 CT1 27 (SUTURE) ×6
SUT VIC AB 0 CT1 27XBRD ANBCTR (SUTURE) ×2 IMPLANT
SUT VIC AB 1 CT1 27 (SUTURE) ×4
SUT VIC AB 1 CT1 27XBRD ANBCTR (SUTURE) ×1 IMPLANT
SUT VIC AB 2-0 CT1 27 (SUTURE) ×8
SUT VIC AB 2-0 CT1 TAPERPNT 27 (SUTURE) ×3 IMPLANT
SUT VICRYL 0 AB UR-6 (SUTURE) ×6 IMPLANT
SUTURE FIBERWR #2 38 T-5 BLUE (SUTURE) ×3 IMPLANT
TOWEL OR 17X26 10 PK STRL BLUE (TOWEL DISPOSABLE) ×2 IMPLANT
TRAY FOLEY BAG SILVER LF 16FR (CATHETERS) ×1 IMPLANT
WATER STERILE IRR 1000ML POUR (IV SOLUTION) ×2 IMPLANT

## 2018-02-27 NOTE — Anesthesia Preprocedure Evaluation (Signed)
Anesthesia Evaluation  Patient identified by MRN, date of birth, ID band Patient awake    Reviewed: Allergy & Precautions, NPO status , Patient's Chart, lab work & pertinent test results  Airway Mallampati: II  TM Distance: >3 FB Neck ROM: Full    Dental  (+) Partial Upper, Partial Lower   Pulmonary    breath sounds clear to auscultation       Cardiovascular hypertension,  Rhythm:Regular Rate:Normal     Neuro/Psych    GI/Hepatic   Endo/Other    Renal/GU      Musculoskeletal   Abdominal   Peds  Hematology   Anesthesia Other Findings   Reproductive/Obstetrics                             Anesthesia Physical Anesthesia Plan  ASA: III  Anesthesia Plan: General   Post-op Pain Management:  Regional for Post-op pain   Induction: Intravenous  PONV Risk Score and Plan: Ondansetron and Dexamethasone  Airway Management Planned: Oral ETT  Additional Equipment:   Intra-op Plan:   Post-operative Plan: Extubation in OR  Informed Consent: I have reviewed the patients History and Physical, chart, labs and discussed the procedure including the risks, benefits and alternatives for the proposed anesthesia with the patient or authorized representative who has indicated his/her understanding and acceptance.   Dental advisory given  Plan Discussed with: CRNA and Anesthesiologist  Anesthesia Plan Comments:         Anesthesia Quick Evaluation

## 2018-02-27 NOTE — Anesthesia Procedure Notes (Signed)
Procedure Name: Intubation Date/Time: 02/27/2018 2:28 PM Performed by: Scheryl Darter, CRNA Pre-anesthesia Checklist: Patient identified, Emergency Drugs available, Suction available and Patient being monitored Patient Re-evaluated:Patient Re-evaluated prior to induction Oxygen Delivery Method: Circle System Utilized Preoxygenation: Pre-oxygenation with 100% oxygen Induction Type: IV induction Ventilation: Mask ventilation without difficulty Laryngoscope Size: Miller and 2 Grade View: Grade I Tube type: Oral Tube size: 7.5 mm Number of attempts: 1 Airway Equipment and Method: Stylet and Oral airway Placement Confirmation: ETT inserted through vocal cords under direct vision,  positive ETCO2 and breath sounds checked- equal and bilateral Secured at: 22 cm Tube secured with: Tape Dental Injury: Teeth and Oropharynx as per pre-operative assessment

## 2018-02-27 NOTE — Progress Notes (Signed)
Report received from Erika B. RN 

## 2018-02-27 NOTE — Brief Op Note (Signed)
02/27/2018  5:20 PM  PATIENT:  [name]  67 y.o. male  PRE-OPERATIVE DIAGNOSIS:  Left Shoulder Rotator Cuff Arthropathy  POST-OPERATIVE DIAGNOSIS:  Left Shoulder Rotator Cuff Arthropathy  PROCEDURE:  Procedure(s): LEFT REVERSE SHOULDER REPLACEMENT  SURGEON:  Surgeon(s): Marlou Sa, Tonna Corner, MD  ASSISTANT: Laure Kidney rnfa  ANESTHESIA:   general  EBL: 200 ml    Total I/O In: 1000 [I.V.:1000] Out: 1100 [Urine:900; Blood:200]  BLOOD ADMINISTERED: none  DRAINS: none   LOCAL MEDICATIONS USED:  none ECIMEN:  No Specimen  COUNTS:  YES  TOURNIQUET:  * No tourniquets in log *  DICTATION: .Other Dictation: Dictation Number [id number]  PLAN OF CARE: Admit to inpatient   PATIENT DISPOSITION:  PACU - hemodynamically stable

## 2018-02-27 NOTE — Transfer of Care (Signed)
Immediate Anesthesia Transfer of Care Note  Patient: Jeremiah [name]  Procedure(s) Performed: LEFT REVERSE SHOULDER REPLACEMENT (Left Shoulder)  Patient Location: PACU  Anesthesia Type:GA combined with regional for post-op pain  Level of Consciousness: awake and patient cooperative  Airway & Oxygen Therapy: Patient Spontanous Breathing  Post-op Assessment: Report given to RN and Post -op Vital signs reviewed and stable  Post vital signs: Reviewed and stable  Last Vitals:  Vitals Value Taken Time  BP 206/188 02/27/2018  5:50 PM  Temp    Pulse 64 02/27/2018  5:51 PM  Resp 19 02/27/2018  5:51 PM  SpO2 89 % 02/27/2018  5:51 PM  Vitals shown include unvalidated device data.  Last Pain:  Vitals:   02/27/18 1315  TempSrc:   PainSc: 0-No pain         Complications: No apparent anesthesia complications

## 2018-02-27 NOTE — Anesthesia Postprocedure Evaluation (Signed)
Anesthesia Post Note  Patient: Jeremiah [name]  Procedure(s) Performed: LEFT REVERSE SHOULDER REPLACEMENT (Left Shoulder)     Patient location during evaluation: PACU Anesthesia Type: General and Regional Level of consciousness: awake and alert Pain management: pain level controlled Vital Signs Assessment: post-procedure vital signs reviewed and stable Respiratory status: spontaneous breathing, nonlabored ventilation, respiratory function stable and patient connected to nasal cannula oxygen Cardiovascular status: blood pressure returned to baseline and stable Postop Assessment: no apparent nausea or vomiting Anesthetic complications: no    Last Vitals:  Vitals:   02/27/18 1900 02/27/18 1955  BP: (!) 99/51 107/72  Pulse: 65 61  Resp: 17   Temp: (!) 36.4 C (!) 36.3 C  SpO2: 97% 99%    Last Pain:  Vitals:   02/27/18 1955  TempSrc: Oral  PainSc:                  Jeremiah [name]

## 2018-02-27 NOTE — Anesthesia Procedure Notes (Signed)
Anesthesia Regional Block: Interscalene brachial plexus block   Pre-Anesthetic Checklist: ,, timeout performed, Correct Patient, Correct Site, Correct Laterality, Correct Procedure, Correct Position, site marked, Risks and benefits discussed,  Surgical consent,  Pre-op evaluation,  At surgeon's request and post-op pain management  Laterality: Left  Prep: chloraprep       Needles:  Injection technique: Single-shot  Needle Type: Stimulator Needle - 40      Needle Gauge: 22     Additional Needles:   Procedures:, nerve stimulator,,, ultrasound used (permanent image in chart),,,,  Narrative:  Start time: 02/27/2018 1:05 PM End time: 02/27/2018 1:10 PM Injection made incrementally with aspirations every 5 mL.  Performed by: Personally  Anesthesiologist: Roberts Gaudy, MD  Additional Notes: 20 cc 0.75% Ropivacaine with 0.2 mg clonidine injected easily

## 2018-02-27 NOTE — Progress Notes (Signed)
Dr Linna Caprice called and informed Heart rate 43-44 at times. Pt states he feels fine. No new order noted at this time.
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
## 2018-02-28 NOTE — Op Note (Signed)
NAME:  Gallien, Cas                    ACCOUNT NO.:  MEDICAL RECORD NO.:  [id number]  LOCATION:                                 FACILITY:  PHYSICIAN:  Anderson Malta, M.D.         DATE OF BIRTH:  DATE OF PROCEDURE: DATE OF DISCHARGE:                              OPERATIVE REPORT   PREOPERATIVE DIAGNOSIS:  Left shoulder rotator cuff arthropathy.  POSTOPERATIVE DIAGNOSIS:  Left shoulder rotator cuff arthropathy.  PROCEDURE:  Left reverse shoulder replacement.  SURGEON:  Anderson Malta, M.D.  ASSISTANT:  Laure Kidney, RNFA.  INDICATIONS:  Jeremiah [name] is a patient with end-stage left shoulder rotator cuff arthropathy, who presents for operative management after explanation of risks and benefits.  IMPLANTS UTILIZED:  Arthrex reverse shoulder replacement 24+ 2 lateralized base plate, 35 central screw, 5.5 mm peri locking screws in the base plate, 16 mm, 20 mm, 24 mm, and 24 mm with a 39+ 4 lateralized glenosphere size 12 stem, size 39 suture cup with size 39+ 6 humeral insert.  PROCEDURE IN DETAIL:  The patient was brought to the operating room where general endotracheal anesthesia was induced.  Preop antibiotics were administered.  Time-out was called.  Left shoulder examined under anesthesia, found to have relative stiffness.  Shoulder area was prescrubbed with hydrogen peroxide, alcohol, and then Betadine which was allowed to air dry.  Then, the shoulder was prepped with DuraPrep solution, draped in a sterile manner.  Charlie Pitter was used to cover the entire operative field.  Time-out was called.  Deltopectoral approach was made.  The patient's head was in neutral position within the beach chair positioner.  Deltopectoral approach was made.  Cephalic vein mobilized medially.  The biceps tendon had already ruptured.  The subscapularis inferior portion was essentially ruptured as well.  He did have some thin capsular tissue present.  The axillary nerve was palpated and protected at all  times during the case.  At this time, the subdeltoid and subacromial space was cleared with a Cobb.  Deltoid partially released anteriorly at its attachment site to facilitate mobilization of the deltoid.  Following this, the axillary nerve was located, palpated, and protected.  Progressive peeling of the capsule from the anterior and inferior humeral neck and calcar region was performed.  This was taken about 2 cm off the inferior aspect of the neck and around to the 7 o'clock position.  The intramedullary cutting block was then used to cut the head in about 25 degrees of retroversion. The head cut was made and a cap was applied.  Attention was then directed towards the glenoid.  A circumferential capsular release was performed including release of the triceps partially off the inferior aspect of the glenoid.  This allowed for posterior subluxation of the humeral head.  With the humeral head posteriorly subluxated, guide pin was placed in accordance with preoperative templating of the glenoid. Reaming and drilling were performed and essentially primarily inferior reaming along which was about 5 to 6 mm and mild anterior reaming was performed.  This was perpendicular to the axis of the glenoid face in accordance with preoperative review of the CT  scan.  A base plate was then screwed into position with excellent purchase obtained.  Four peripheral locking screws were placed and a glenosphere was then placed in good position and alignment.  The central screw into the glenosphere demonstrated correct placement.  Attention was then directed towards the humerus.  Broaching performed up to a size 12 stem.  Then, the cheese grater reaming was performed within the inner surface of the exposed humerus.  At this time, a trial reduction was performed and the 39 cup, +6 humeral insert gave excellent stability and range of motion.  It was difficult to relocate and dislocate and also demonstrated  negative nerve sign with no subluxation or dislocation with abduction, extension, and superior force.  Trial components removed on the humeral side.  Thorough irrigation performed.  True components placed.  The same stability parameters maintained.  Thorough irrigation again performed, vanc powder placed within the incision.  Deltopectoral incision was then closed using #1 Vicryl suture followed by interrupted inverted 2-0 Vicryl suture and a 3-0 Monocryl.  Aquacel dressing placed along with a shoulder immobilizer.  The patient tolerated the procedure well without immediate complication and transferred to recovery room in stable condition.     Anderson Malta, M.D.     GSD/MEDQ  D:  02/27/2018  T:  02/28/2018  Job:  [id number]

## 2018-02-28 NOTE — Progress Notes (Signed)
Subjective: Pt stable - pain ok - block wearing off   Objective: Vital signs in last 24 hours: Temp:  [97.4 F (36.3 C)-98.3 F (36.8 C)] 97.9 F (36.6 C) (04/10 0420) Pulse Rate:  [48-72] 59 (04/10 0420) Resp:  [11-20] 17 (04/09 1900) BP: (93-136)/(51-83) 102/62 (04/10 0420) SpO2:  [94 %-100 %] 96 % (04/10 0420)  Intake/Output from previous day: 04/09 0701 - 04/10 0700 In: 2090 [P.O.:240; I.V.:1600; IV Piggyback:250] Out: 2700 [Urine:2500; Blood:200] Intake/Output this shift: No intake/output data recorded.  Exam:  No cellulitis present Compartment soft  Labs: No results for input(s): HGB in the last 72 hours. No results for input(s): WBC, RBC, HCT, PLT in the last 72 hours. No results for input(s): NA, K, CL, CO2, BUN, CREATININE, GLUCOSE, CALCIUM in the last 72 hours. No results for input(s): LABPT, INR in the last 72 hours.  Assessment/Plan: Plan ot today and then dc   [employer] 02/28/2018, 7:20 AM

## 2018-02-28 NOTE — Evaluation (Signed)
Occupational Therapy Evaluation Patient Details Name: Jeremiah [name] MRN: [id number] DOB: Jul 24, 1951 Today's Date: 02/28/2018    History of Present Illness  LEFT REVERSE SHOULDER REPLACEMENT per Dr Moshe Cipro   Clinical Impression   OT education complete regarding ADL activity and ROM     Follow Up Recommendations  Follow surgeon's recommendation for DC plan and follow-up therapies    Equipment Recommendations  None recommended by OT       Precautions / Restrictions Precautions Precautions: Shoulder Precaution Comments: FF 0-140, ER 0-30, Abduction 0-60 Restrictions Weight Bearing Restrictions: Yes LUE Weight Bearing: Non weight bearing      Mobility Bed Mobility          pt in chair        Transfers      pt is S with transfers with wife to provide S                      Pertinent Vitals/Pain Pain Assessment: 0-10 Pain Location: R shoulder Pain Descriptors / Indicators: Sore Pain Intervention(s): Limited activity within patient's tolerance;Monitored during session;Repositioned     Hand Dominance     Extremity/Trunk Assessment Upper Extremity Assessment Upper Extremity Assessment: LUE deficits/detail           Communication Communication Communication: No difficulties   Cognition Arousal/Alertness: Awake/alert Behavior During Therapy: WFL for tasks assessed/performed Overall Cognitive Status: Within Functional Limits for tasks assessed                                     General Comments       Exercises Shoulder Exercises Pendulum Exercise: 10 reps;Standing;Left Elbow Flexion: AAROM;10 reps;Standing;Left Elbow Extension: AAROM;10 reps;Standing;Left Wrist Flexion: AROM;Left;10 reps Wrist Extension: AROM;Left;10 reps   Shoulder Instructions Shoulder Instructions Donning/doffing shirt without moving shoulder: Caregiver independent with task;Min-guard Method for sponge bathing under operated UE: Caregiver independent  with task;Min-guard Donning/doffing sling/immobilizer: Caregiver independent with task;Min-guard Correct positioning of sling/immobilizer: Caregiver independent with task;Min-guard ROM for elbow, wrist and digits of operated UE: Minimal assistance;Caregiver independent with task Sling wearing schedule (on at all times/off for ADL's): Supervision/safety Proper positioning of operated UE when showering: Supervision/safety Positioning of UE while sleeping: [name] expects to be discharged to:: Private residence Living Arrangements: Spouse/significant other Available Help at Discharge: Family Type of Home: House Home Access: Stairs to enter     Home Layout: One level     Bathroom Shower/Tub: Occupational psychologist: Standard     Home Equipment: Radio producer - single point          Prior Functioning/Environment Level of Independence: Independent with assistive device(s)                          OT Goals(Current goals can be found in the care plan section) Acute Rehab OT Goals Patient Stated Goal: home today  OT Frequency:      AM-PAC PT "6 Clicks" Daily Activity     Outcome Measure Help from another person eating meals?: None Help from another person taking care of personal grooming?: A Little Help from another person toileting, which includes using toliet, bedpan, or urinal?: A Little Help from another person bathing (including washing, rinsing, drying)?: A Little Help from another person to put on and taking off regular upper body clothing?: A Little Help from another person  to put on and taking off regular lower body clothing?: A Little 6 Click Score: 19   End of Session Equipment Utilized During Treatment: Gait belt  Activity Tolerance: Patient tolerated treatment well Patient left: in chair                   Time: 0263-7858 OT Time Calculation (min): 20 min Charges:  OT General Charges $OT Visit: 1 Visit OT  Evaluation $OT Eval Moderate Complexity: 1 Mod G-Codes:     [name], OT [phone]  [name] D 02/28/2018, 2:06 PM

## 2018-02-28 NOTE — Care Management Note (Signed)
Case Management Note  Patient Details  Name: Jeremiah [name] MRN: [id number] Date of Birth: January 18, 1951  Subjective/Objective:    Left TSA                 Action/Plan: Chart reviewed. No NCM needs identified. Scheduled dc home today.   Expected Discharge Date:  02/28/18               Expected Discharge Plan:  Home/Self Care  In-House Referral:  NA  Discharge planning Services  CM Consult  Post Acute Care Choice:  NA Choice offered to:  NA  DME Arranged:  N/A DME Agency:  NA  HH Arranged:  NA HH Agency:  NA  Status of Service:  Completed, signed off  If discussed at [place] of Stay Meetings, dates discussed:    Additional Comments:  Erenest Rasher, RN 02/28/2018, 10:44 AM

## 2018-02-28 NOTE — Progress Notes (Signed)
Gave pt and family member discharge instructions, they stated they're understanding did not have any further questions, IV has been removed.

## 2018-03-01 ENCOUNTER — Encounter (HOSPITAL_COMMUNITY): Payer: Self-pay | Admitting: Orthopedic Surgery

## 2018-03-05 ENCOUNTER — Telehealth (INDEPENDENT_AMBULATORY_CARE_PROVIDER_SITE_OTHER): Payer: Self-pay

## 2018-03-05 NOTE — Telephone Encounter (Signed)
Nurse @ [facility] called to let us know that they will be beginning services with pt on 03/07/18. They would have began sooner but there was a delay in getting in touch with the patient.

## 2018-03-06 ENCOUNTER — Encounter: Payer: Self-pay | Admitting: Cardiovascular Disease

## 2018-03-06 ENCOUNTER — Ambulatory Visit: Payer: PPO | Admitting: Cardiovascular Disease

## 2018-03-06 VITALS — BP 108/74 | HR 82 | Ht 67.0 in | Wt 228.0 lb

## 2018-03-06 DIAGNOSIS — E7800 Pure hypercholesterolemia, unspecified: Secondary | ICD-10-CM | POA: Diagnosis not present

## 2018-03-06 DIAGNOSIS — I25810 Atherosclerosis of coronary artery bypass graft(s) without angina pectoris: Secondary | ICD-10-CM

## 2018-03-06 DIAGNOSIS — I10 Essential (primary) hypertension: Secondary | ICD-10-CM

## 2018-03-06 NOTE — Patient Instructions (Addendum)

## 2018-03-08 ENCOUNTER — Ambulatory Visit (INDEPENDENT_AMBULATORY_CARE_PROVIDER_SITE_OTHER): Payer: PPO | Admitting: Orthopedic Surgery

## 2018-03-08 ENCOUNTER — Ambulatory Visit (INDEPENDENT_AMBULATORY_CARE_PROVIDER_SITE_OTHER): Payer: PPO

## 2018-03-08 ENCOUNTER — Encounter (INDEPENDENT_AMBULATORY_CARE_PROVIDER_SITE_OTHER): Payer: Self-pay | Admitting: Orthopedic Surgery

## 2018-03-08 ENCOUNTER — Telehealth (INDEPENDENT_AMBULATORY_CARE_PROVIDER_SITE_OTHER): Payer: Self-pay | Admitting: Orthopedic Surgery

## 2018-03-08 DIAGNOSIS — M12812 Other specific arthropathies, not elsewhere classified, left shoulder: Secondary | ICD-10-CM | POA: Diagnosis not present

## 2018-03-08 NOTE — Telephone Encounter (Signed)
Pt came into office today and stated his arm pump made by [employer] isn't working properly. Pt stated the machine will turn on and it will not pump. Pt left device with [place] until his 1:15pm appt today with Dr.Dean. I called pt and left a Vm to let pt aware that he would have to pick it up before his appt today.

## 2018-03-08 NOTE — Telephone Encounter (Signed)
Patient arrived to his appt 45 min early and was very loud about the machine stating it was useless, no one explained anything to him, that he was doped up in the hospital and it was just "dropped off" and that he was pissed about it. I explained to patient multiple times that he needed to keep machine, needs to be using it for his post op recovery and He seemed to me, very inappropriate with his tone in front of other patients in the waiting room and proceeded to jerk the bag from my hand and walked out the door while I was trying to explain to him if he did not understand or the machine was not working we could have the rep come out to his home.

## 2018-03-08 NOTE — Telephone Encounter (Signed)
I had advised the front desk not to take the machine from the patient that they patient needed it to be using at home.

## 2018-03-08 NOTE — Telephone Encounter (Signed)
Dr Marlou Sa is aware.
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
## 2018-03-12 NOTE — Discharge Summary (Signed)
Physician Discharge Summary  Patient ID: Jeremiah [name] MRN: [id number] DOB/AGE: 12-22-1950 67 y.o.  Admit date: 02/27/2018 Discharge date: 02/28/2018  Admission Diagnoses:  Active Problems:   Shoulder arthritis   Discharge Diagnoses:  Same  Surgeries: Procedure(s): LEFT REVERSE SHOULDER REPLACEMENT on 02/27/2018   Consultants:   Discharged Condition: Stable  Hospital Course: Jeremiah [name] is an 67 y.o. male who was admitted 02/27/2018 with a chief complaint of left shoulder pain, and found to have a diagnosis of  left shoulder arthritis and rotator cuff arthropathy .  They were brought to the operating room on 02/27/2018 and underwent the above named procedures.  Patient tolerated the procedure well without immediate complications.  He was mobilized with occupational therapy for passive and active assisted range of motion of the left shoulder.  Discharged home in good condition.  He will continue with passive range of motion but done through his splint.  We will start him in physical therapy sometime after his first postop visit  Antibiotics given:  Anti-infectives (From admission, onward)   Start     Dose/Rate Route Frequency Ordered Stop   02/28/18 0600  ceFAZolin (ANCEF) IVPB 2g/100 mL premix     2 g 200 mL/hr over 30 Minutes Intravenous On call to O.R. 02/27/18 1227 02/27/18 1430   02/27/18 2030  ceFAZolin (ANCEF) IVPB 2g/100 mL premix     2 g 200 mL/hr over 30 Minutes Intravenous Every 6 hours 02/27/18 1950 02/28/18 0628   02/27/18 1659  vancomycin (VANCOCIN) powder  Status:  Discontinued       As needed 02/27/18 1659 02/27/18 1746    .  Recent vital signs:  Vitals:   02/28/18 0034 02/28/18 0420  BP: (!) 101/58 102/62  Pulse: 62 (!) 59  Resp:    Temp: (!) 97.4 F (36.3 C) 97.9 F (36.6 C)  SpO2: 98% 96%    Recent laboratory studies:  Results for orders placed or performed during the hospital encounter of 02/20/18  Urine culture  Result Value Ref Range   Specimen  Description URINE, CLEAN CATCH    Special Requests NONE    Culture (A)     <10,000 COLONIES/mL INSIGNIFICANT GROWTH Performed at [facility] [address] [place], [place] [address]    Report Status 02/21/2018 FINAL   Surgical pcr screen  Result Value Ref Range   MRSA, PCR NEGATIVE NEGATIVE   Staphylococcus aureus POSITIVE (A) NEGATIVE  Basic metabolic panel  Result Value Ref Range   Sodium 139 135 - 145 mmol/L   Potassium 3.9 3.5 - 5.1 mmol/L   Chloride 109 101 - 111 mmol/L   CO2 21 (L) 22 - 32 mmol/L   Glucose, Bld 91 65 - 99 mg/dL   BUN 20 6 - 20 mg/dL   Creatinine, Ser 0.97 0.61 - 1.24 mg/dL   Calcium 9.3 8.9 - 10.3 mg/dL   GFR calc non Af Amer >60 >60 mL/min   GFR calc Af Amer >60 >60 mL/min   Anion gap 9 5 - 15  CBC  Result Value Ref Range   WBC 7.5 4.0 - 10.5 K/uL   RBC 4.42 4.22 - 5.81 MIL/uL   Hemoglobin 13.0 13.0 - 17.0 g/dL   HCT 41.0 39.0 - 52.0 %   MCV 92.8 78.0 - 100.0 fL   MCH 29.4 26.0 - 34.0 pg   MCHC 31.7 30.0 - 36.0 g/dL   RDW 14.2 11.5 - 15.5 %   Platelets 244 150 - 400  K/uL  Urinalysis, Routine w reflex microscopic  Result Value Ref Range   Color, Urine YELLOW YELLOW   APPearance CLEAR CLEAR   Specific Gravity, Urine 1.019 1.005 - 1.030   pH 5.0 5.0 - 8.0   Glucose, UA NEGATIVE NEGATIVE mg/dL   Hgb urine dipstick NEGATIVE NEGATIVE   Bilirubin Urine NEGATIVE NEGATIVE   Ketones, ur NEGATIVE NEGATIVE mg/dL   Protein, ur NEGATIVE NEGATIVE mg/dL   Nitrite NEGATIVE NEGATIVE   Leukocytes, UA NEGATIVE NEGATIVE  Type and screen Order type and screen if day of surgery is less than 15 days from draw of preadmission visit or order morning of surgery if day of surgery is greater than 6 days from preadmission visit.  Result Value Ref Range   ABO/RH(D) A POS    Antibody Screen NEG    Sample Expiration 03/06/2018    Extend sample reason      NO TRANSFUSIONS OR PREGNANCY IN THE PAST 3 MONTHS Performed at [facility], [address]  [place], [place] [address]   ABO/Rh  Result Value Ref Range   ABO/RH(D)      A POS Performed at [facility] [address], [place], [place] [address]     Discharge Medications:   Allergies as of 02/28/2018   No Known Allergies     Medication List    STOP taking these medications   acetaminophen 325 MG tablet Commonly known as:  TYLENOL     TAKE these medications   allopurinol 100 MG tablet Commonly known as:  ZYLOPRIM Take 100 mg by mouth 2 (two) times daily.   ALPRAZolam 1 MG tablet Commonly known as:  XANAX Take 1 mg by mouth every 8 (eight) hours.   aspirin 325 MG tablet Take 325 mg by mouth daily.   buPROPion 300 MG 24 hr tablet Commonly known as:  WELLBUTRIN XL Take 300 mg by mouth every morning.   CALCIUM-VITAMIN D3 PO Take 2 each by mouth daily.   FLUoxetine 40 MG capsule Commonly known as:  PROZAC Take 40 mg by mouth daily.   GUMMI BEAR MULTIVITAMIN/MIN PO Take 2 each by mouth daily.   labetalol 100 MG tablet Commonly known as:  NORMODYNE Take 100 mg by mouth daily.   levocetirizine 5 MG tablet Commonly known as:  XYZAL Take 5 mg by mouth every evening.   lisinopril 10 MG tablet Commonly known as:  PRINIVIL,ZESTRIL Take 10 mg by mouth daily.   oxyCODONE-acetaminophen 10-325 MG tablet Commonly known as:  PERCOCET Take 1 tablet by mouth every 6 (six) hours as needed for pain.   simvastatin 40 MG tablet Commonly known as:  ZOCOR Take 40 mg by mouth every evening.   tiZANidine 2 MG tablet Commonly known as:  ZANAFLEX Take 2 mg by mouth 2 (two) times daily.   vitamin B-12 500 MCG tablet Commonly known as:  CYANOCOBALAMIN Take 1,000 mcg by mouth daily.   zolpidem 10 MG tablet Commonly known as:  AMBIEN Take 10 mg by mouth at bedtime.       Diagnostic Studies: Ct Shoulder Left Wo Contrast  Result Date: 02/15/2018 CLINICAL DATA:  Osteoarthritis. Preoperative study prior to shoulder replacement. EXAM: CT OF THE UPPER LEFT EXTREMITY  WITHOUT CONTRAST TECHNIQUE: Multidetector CT imaging of the upper left extremity was performed according to the standard protocol. COMPARISON:  Left shoulder MRI dated Mar 27, 2016. Left shoulder x-rays dated Apr 01, 2015. FINDINGS: Bones/Joint/Cartilage No fracture or dislocation. Mild anterior subluxation of  the humeral head. No joint effusion. Moderate osteoarthritis of the glenohumeral joint with moderate joint space narrowing and marginal osteophytosis. Moderate arthropathy of the acromioclavicular joint. Type II acromion. Ligaments Ligaments are suboptimally evaluated by CT. Muscles and Tendons Narrowing of the acromiohumeral interval secondary to massive rotator cuff tendon tear, unchanged. Small amount of calcification along the distal infraspinatus tendon. Mild fatty atrophy of the supraspinatus, infraspinatus, and subscapularis muscles. Soft tissue No fluid collection or hematoma. No soft tissue mass. The visualized left lung is clear. IMPRESSION: 1. Moderate glenohumeral osteoarthritis with mild anterior subluxation of the humeral head. 2. Moderate acromioclavicular osteoarthritis. 3. Narrowing of the acromiohumeral interval secondary to massive rotator cuff tendon tear, unchanged. Electronically Signed   By: Titus Dubin M.D.   On: 02/15/2018 10:44   Dg Shoulder Left Port  Result Date: 02/27/2018 CLINICAL DATA:  Left reverse shoulder replacement. EXAM: LEFT SHOULDER - 1 VIEW COMPARISON:  CT 02/15/2018 FINDINGS: Uncemented left reverse shoulder arthroplasty without hardware failure nor postoperative complications. No fracture or malalignment. AC joint osteoarthritis. Status post CABG. Adjacent ribs and lung are nonacute. IMPRESSION: No postoperative complications referencing a new left reverse shoulder arthroplasty. Electronically Signed   By: Ashley Royalty M.D.   On: 02/27/2018 19:42   Xr Shoulder Left  Result Date: 03/10/2018 2 views left shoulder reviewed.  Left reverse shoulder replacement in  good position and alignment.  No complicating features.    Disposition:   Discharge Instructions    Call MD / Call 911   Complete by:  As directed    If you experience chest pain or shortness of breath, CALL 911 and be transported to the hospital emergency room.  If you develope a fever above 101 F, pus (white drainage) or increased drainage or redness at the wound, or calf pain, call your surgeon's office.   Constipation Prevention   Complete by:  As directed    Drink plenty of fluids.  Prune juice may be helpful.  You may use a stool softener, such as Colace (over the counter) 100 mg twice a day.  Use MiraLax (over the counter) for constipation as needed.   Diet - low sodium heart healthy   Complete by:  As directed    Discharge instructions   Complete by:  As directed    Sling for comfort cpm 1 hour 3 times a day Ok to shower   Increase activity slowly as tolerated   Complete by:  As directed          Signed: Anderson Malta 03/12/2018, 10:07 AM

## 2018-03-20 ENCOUNTER — Encounter: Payer: Self-pay | Admitting: Physical Therapy

## 2018-03-20 ENCOUNTER — Ambulatory Visit: Payer: PPO | Attending: Orthopedic Surgery | Admitting: Physical Therapy

## 2018-03-20 ENCOUNTER — Other Ambulatory Visit: Payer: Self-pay

## 2018-03-20 DIAGNOSIS — M25512 Pain in left shoulder: Secondary | ICD-10-CM

## 2018-03-20 DIAGNOSIS — R293 Abnormal posture: Secondary | ICD-10-CM | POA: Diagnosis not present

## 2018-03-20 DIAGNOSIS — M6281 Muscle weakness (generalized): Secondary | ICD-10-CM | POA: Diagnosis not present

## 2018-03-20 DIAGNOSIS — M25612 Stiffness of left shoulder, not elsewhere classified: Secondary | ICD-10-CM | POA: Diagnosis not present

## 2018-03-20 NOTE — Patient Instructions (Signed)
   SCAPULAR RETRACTIONS  Draw your shoulder blades back and down.  It should feel like you are squeezing your shoulder blades together.  Repeat 10 times, 2-3 times per day.    RETRACTION / CHIN TUCK  Slowly draw your head back so that your ears line up with your shoulders.  Repeat 10 times, 2-3 times per day.    ALSO - take your arm out of the sling and flex and straighten your elbow, 10-15 times, 3 times per day  - fold up a washcloth, or use a stress ball, and squeeze as hard as you can with your left hand; hold for 5 seconds and repeat 20 times, 3 times per day - continue pendulums as per MD advice
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
## 2018-03-21 DIAGNOSIS — G5602 Carpal tunnel syndrome, left upper limb: Secondary | ICD-10-CM | POA: Diagnosis not present

## 2018-03-21 DIAGNOSIS — M47817 Spondylosis without myelopathy or radiculopathy, lumbosacral region: Secondary | ICD-10-CM | POA: Diagnosis not present

## 2018-03-21 DIAGNOSIS — M4807 Spinal stenosis, lumbosacral region: Secondary | ICD-10-CM | POA: Diagnosis not present

## 2018-03-21 DIAGNOSIS — G894 Chronic pain syndrome: Secondary | ICD-10-CM | POA: Diagnosis not present

## 2018-03-22 ENCOUNTER — Encounter: Payer: Self-pay | Admitting: Physical Therapy

## 2018-03-22 ENCOUNTER — Ambulatory Visit: Payer: PPO | Attending: Orthopedic Surgery | Admitting: Physical Therapy

## 2018-03-22 DIAGNOSIS — M25612 Stiffness of left shoulder, not elsewhere classified: Secondary | ICD-10-CM | POA: Insufficient documentation

## 2018-03-22 DIAGNOSIS — R293 Abnormal posture: Secondary | ICD-10-CM | POA: Diagnosis not present

## 2018-03-22 DIAGNOSIS — M25512 Pain in left shoulder: Secondary | ICD-10-CM | POA: Diagnosis not present

## 2018-03-22 DIAGNOSIS — M6281 Muscle weakness (generalized): Secondary | ICD-10-CM | POA: Diagnosis not present
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
## 2018-03-26 ENCOUNTER — Encounter: Payer: Self-pay | Admitting: Physical Therapy

## 2018-03-26 ENCOUNTER — Ambulatory Visit: Payer: PPO | Admitting: Physical Therapy

## 2018-03-26 DIAGNOSIS — M25512 Pain in left shoulder: Secondary | ICD-10-CM

## 2018-03-26 DIAGNOSIS — M25612 Stiffness of left shoulder, not elsewhere classified: Secondary | ICD-10-CM

## 2018-03-26 DIAGNOSIS — M6281 Muscle weakness (generalized): Secondary | ICD-10-CM
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
## 2018-03-26 NOTE — Patient Instructions (Signed)
Stretch elbow straight every now and then 5 x 5 seconds

## 2018-03-28 ENCOUNTER — Encounter: Payer: Self-pay | Admitting: Physical Therapy

## 2018-03-28 ENCOUNTER — Ambulatory Visit: Payer: PPO | Admitting: Physical Therapy

## 2018-03-28 DIAGNOSIS — M25512 Pain in left shoulder: Secondary | ICD-10-CM

## 2018-03-28 DIAGNOSIS — R293 Abnormal posture: Secondary | ICD-10-CM

## 2018-03-28 DIAGNOSIS — M6281 Muscle weakness (generalized): Secondary | ICD-10-CM

## 2018-03-28 DIAGNOSIS — M25612 Stiffness of left shoulder, not elsewhere classified: Secondary | ICD-10-CM
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
## 2018-04-03 ENCOUNTER — Encounter: Payer: Self-pay | Admitting: Physical Therapy

## 2018-04-03 ENCOUNTER — Ambulatory Visit: Payer: PPO | Admitting: Physical Therapy

## 2018-04-03 ENCOUNTER — Telehealth (INDEPENDENT_AMBULATORY_CARE_PROVIDER_SITE_OTHER): Payer: Self-pay | Admitting: Orthopedic Surgery

## 2018-04-03 DIAGNOSIS — M25512 Pain in left shoulder: Secondary | ICD-10-CM

## 2018-04-03 DIAGNOSIS — M25612 Stiffness of left shoulder, not elsewhere classified: Secondary | ICD-10-CM

## 2018-04-03 DIAGNOSIS — R293 Abnormal posture: Secondary | ICD-10-CM

## 2018-04-03 DIAGNOSIS — M6281 Muscle weakness (generalized): Secondary | ICD-10-CM

## 2018-04-03 NOTE — Telephone Encounter (Signed)
Patients wife called to see if patient could get in the hot tub since he had a shoulder replacement a little over a month ago. She said the wound is healed and wanted to ask before. Please advise # [phone]

## 2018-04-03 NOTE — Telephone Encounter (Signed)
IC and advised. Verbalized understanding.

## 2018-04-03 NOTE — Telephone Encounter (Signed)
Please advise. Thanks.  

## 2018-04-03 NOTE — Telephone Encounter (Signed)
No tub for 6 w post op pls call thx
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
## 2018-04-05 ENCOUNTER — Ambulatory Visit: Payer: PPO | Admitting: Physical Therapy

## 2018-04-05 ENCOUNTER — Encounter: Payer: Self-pay | Admitting: Physical Therapy

## 2018-04-05 DIAGNOSIS — M25612 Stiffness of left shoulder, not elsewhere classified: Secondary | ICD-10-CM

## 2018-04-05 DIAGNOSIS — M6281 Muscle weakness (generalized): Secondary | ICD-10-CM

## 2018-04-05 DIAGNOSIS — M25512 Pain in left shoulder: Secondary | ICD-10-CM | POA: Diagnosis not present

## 2018-04-05 DIAGNOSIS — R293 Abnormal posture: Secondary | ICD-10-CM
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
## 2018-04-10 ENCOUNTER — Encounter: Payer: Self-pay | Admitting: Physical Therapy

## 2018-04-10 ENCOUNTER — Ambulatory Visit: Payer: PPO | Admitting: Physical Therapy

## 2018-04-10 DIAGNOSIS — M6281 Muscle weakness (generalized): Secondary | ICD-10-CM

## 2018-04-10 DIAGNOSIS — M25512 Pain in left shoulder: Secondary | ICD-10-CM

## 2018-04-10 DIAGNOSIS — M25612 Stiffness of left shoulder, not elsewhere classified: Secondary | ICD-10-CM

## 2018-04-10 DIAGNOSIS — R293 Abnormal posture: Secondary | ICD-10-CM

## 2018-04-10 NOTE — Patient Instructions (Signed)
   Supine shoulder flexion AROM  Lie on your back with the arm at your side.  Raise the arm up overhead in a comfortable range.  Try to keep your elbow in and straight.  Repeat 10-15 times, twice a day (morning and evening)     AROM SHOULDER ABDUCTION- DO THIS EXERCISE LAYING ON YOUR BACK   With your affected arm starting at your side with your thumb pointed upward, raise up your arm to the side.  Repeat 10-15 times, twice a day (morning and evening)    Serratus Anterior Punches  Start: Lying on your back with your arms up toward the ceiling.  Movement: While holding a weight, attempt to push arm up towards the ceiling, keeping elbow straight and back against the table/floor.   End: Return to start position. Repeat.   Repeat 10-15 times, twice a day (morning and evening)
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
## 2018-04-11 ENCOUNTER — Encounter (INDEPENDENT_AMBULATORY_CARE_PROVIDER_SITE_OTHER): Payer: Self-pay | Admitting: Orthopedic Surgery

## 2018-04-11 ENCOUNTER — Ambulatory Visit (INDEPENDENT_AMBULATORY_CARE_PROVIDER_SITE_OTHER): Payer: PPO | Admitting: Orthopedic Surgery

## 2018-04-11 DIAGNOSIS — M12812 Other specific arthropathies, not elsewhere classified, left shoulder: Secondary | ICD-10-CM
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
## 2018-04-12 ENCOUNTER — Ambulatory Visit: Payer: PPO

## 2018-04-13 ENCOUNTER — Encounter

## 2018-04-16 ENCOUNTER — Encounter (HOSPITAL_COMMUNITY): Payer: Self-pay

## 2018-04-16 ENCOUNTER — Emergency Department (HOSPITAL_COMMUNITY)
Admission: EM | Admit: 2018-04-16 | Discharge: 2018-04-16 | Disposition: A | Discharge status: Home / Self Care | Payer: PPO | Attending: Emergency Medicine | Admitting: Emergency Medicine

## 2018-04-16 ENCOUNTER — Emergency Department (HOSPITAL_COMMUNITY): Payer: PPO

## 2018-04-16 DIAGNOSIS — R0789 Other chest pain: Secondary | ICD-10-CM | POA: Diagnosis not present

## 2018-04-16 DIAGNOSIS — I2510 Atherosclerotic heart disease of native coronary artery without angina pectoris: Secondary | ICD-10-CM | POA: Diagnosis not present

## 2018-04-16 DIAGNOSIS — I10 Essential (primary) hypertension: Secondary | ICD-10-CM | POA: Diagnosis not present

## 2018-04-16 DIAGNOSIS — E7800 Pure hypercholesterolemia, unspecified: Secondary | ICD-10-CM | POA: Insufficient documentation

## 2018-04-16 DIAGNOSIS — Z7982 Long term (current) use of aspirin: Secondary | ICD-10-CM | POA: Diagnosis not present

## 2018-04-16 DIAGNOSIS — Z79899 Other long term (current) drug therapy: Secondary | ICD-10-CM | POA: Diagnosis not present

## 2018-04-16 DIAGNOSIS — R079 Chest pain, unspecified: Secondary | ICD-10-CM | POA: Diagnosis not present

## 2018-04-16 LAB — BASIC METABOLIC PANEL
Anion gap: 10 (ref 5–15)
BUN: 18 mg/dL (ref 6–20)
CALCIUM: 9.4 mg/dL (ref 8.9–10.3)
CO2: 23 mmol/L (ref 22–32)
CREATININE: 0.95 mg/dL (ref 0.61–1.24)
Chloride: 109 mmol/L (ref 101–111)
Glucose, Bld: 120 mg/dL — ABNORMAL HIGH (ref 65–99)
Potassium: 4.1 mmol/L (ref 3.5–5.1)
Sodium: 142 mmol/L (ref 135–145)

## 2018-04-16 LAB — CBC
HCT: 40.5 % (ref 39.0–52.0)
HEMOGLOBIN: 12.8 g/dL — AB (ref 13.0–17.0)
MCH: 28.4 pg (ref 26.0–34.0)
MCHC: 31.6 g/dL (ref 30.0–36.0)
MCV: 90 fL (ref 78.0–100.0)
Platelets: 305 10*3/uL (ref 150–400)
RBC: 4.5 MIL/uL (ref 4.22–5.81)
RDW: 13.6 % (ref 11.5–15.5)
WBC: 8.1 10*3/uL (ref 4.0–10.5)

## 2018-04-16 LAB — I-STAT TROPONIN, ED
TROPONIN I, POC: 0.02 ng/mL (ref 0.00–0.08)
Troponin i, poc: 0.01 ng/mL (ref 0.00–0.08)

## 2018-04-16 MED ORDER — KETOROLAC TROMETHAMINE 15 MG/ML IJ SOLN
15.0000 mg | Freq: Once | INTRAMUSCULAR | Status: AC
  Administered 2018-04-16: 15 mg via INTRAVENOUS
  Filled 2018-04-16: qty 1

## 2018-04-16 NOTE — ED Triage Notes (Signed)
Pt presents for evaluation of chest pain, lightheadedness, and nausea x 1 hour.
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
## 2018-04-17 ENCOUNTER — Ambulatory Visit: Payer: PPO | Admitting: Physical Therapy

## 2018-04-17 ENCOUNTER — Encounter: Payer: Self-pay | Admitting: Physical Therapy

## 2018-04-17 DIAGNOSIS — M6281 Muscle weakness (generalized): Secondary | ICD-10-CM

## 2018-04-17 DIAGNOSIS — M25612 Stiffness of left shoulder, not elsewhere classified: Secondary | ICD-10-CM

## 2018-04-17 DIAGNOSIS — R293 Abnormal posture: Secondary | ICD-10-CM

## 2018-04-17 DIAGNOSIS — M25512 Pain in left shoulder: Secondary | ICD-10-CM
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
## 2018-04-19 ENCOUNTER — Encounter: Payer: Self-pay | Admitting: Physical Therapy

## 2018-04-19 ENCOUNTER — Ambulatory Visit: Payer: PPO | Admitting: Physical Therapy

## 2018-04-19 DIAGNOSIS — G894 Chronic pain syndrome: Secondary | ICD-10-CM | POA: Diagnosis not present

## 2018-04-19 DIAGNOSIS — M4807 Spinal stenosis, lumbosacral region: Secondary | ICD-10-CM | POA: Diagnosis not present

## 2018-04-19 DIAGNOSIS — G5602 Carpal tunnel syndrome, left upper limb: Secondary | ICD-10-CM | POA: Diagnosis not present

## 2018-04-19 DIAGNOSIS — Z79891 Long term (current) use of opiate analgesic: Secondary | ICD-10-CM | POA: Diagnosis not present

## 2018-04-19 DIAGNOSIS — M47817 Spondylosis without myelopathy or radiculopathy, lumbosacral region: Secondary | ICD-10-CM | POA: Diagnosis not present

## 2018-05-04 ENCOUNTER — Encounter: Payer: Self-pay | Admitting: Physical Therapy

## 2018-05-04 NOTE — Therapy (Signed)
[place], [place], [address] Phone: [phone]   Fax:  [phone]  Patient Details  Name: Jeremiah [name] MRN: [id number] Date of Birth: [birth year] Referring Provider:  No ref. provider found  Encounter Date: 05/04/2018  PHYSICAL THERAPY DISCHARGE SUMMARY  Visits from Start of Care: 8  Current functional level related to goals / functional outcomes: Patient requesting to be discharged due to being satisfied with current level of function.    Remaining deficits: Unable to assess    Education / Equipment: N/A  Plan: Patient agrees to discharge.  Patient goals were met. Patient is being discharged due to meeting the stated rehab goals.  ?????      Deniece Ree PT, DPT, CBIS  Supplemental Physical Therapist [place]   Pager [place] [facility] [address] [place], [place], [address] Phone: [phone]   Fax:  [phone]

## 2018-06-18 DIAGNOSIS — M47817 Spondylosis without myelopathy or radiculopathy, lumbosacral region: Secondary | ICD-10-CM | POA: Diagnosis not present

## 2018-06-18 DIAGNOSIS — G5602 Carpal tunnel syndrome, left upper limb: Secondary | ICD-10-CM | POA: Diagnosis not present

## 2018-06-18 DIAGNOSIS — M4807 Spinal stenosis, lumbosacral region: Secondary | ICD-10-CM | POA: Diagnosis not present

## 2018-06-18 DIAGNOSIS — G894 Chronic pain syndrome: Secondary | ICD-10-CM | POA: Diagnosis not present

## 2018-07-03 DIAGNOSIS — F419 Anxiety disorder, unspecified: Secondary | ICD-10-CM | POA: Diagnosis not present

## 2018-07-03 DIAGNOSIS — G4700 Insomnia, unspecified: Secondary | ICD-10-CM | POA: Diagnosis not present

## 2018-07-03 DIAGNOSIS — I10 Essential (primary) hypertension: Secondary | ICD-10-CM | POA: Diagnosis not present

## 2018-07-03 DIAGNOSIS — M109 Gout, unspecified: Secondary | ICD-10-CM | POA: Diagnosis not present

## 2018-07-03 DIAGNOSIS — F325 Major depressive disorder, single episode, in full remission: Secondary | ICD-10-CM | POA: Diagnosis not present

## 2018-07-03 DIAGNOSIS — I2510 Atherosclerotic heart disease of native coronary artery without angina pectoris: Secondary | ICD-10-CM | POA: Diagnosis not present

## 2018-07-13 ENCOUNTER — Encounter (INDEPENDENT_AMBULATORY_CARE_PROVIDER_SITE_OTHER): Payer: Self-pay | Admitting: Orthopedic Surgery

## 2018-07-13 ENCOUNTER — Ambulatory Visit (INDEPENDENT_AMBULATORY_CARE_PROVIDER_SITE_OTHER): Payer: PPO | Admitting: Orthopedic Surgery

## 2018-07-13 DIAGNOSIS — M12812 Other specific arthropathies, not elsewhere classified, left shoulder: Secondary | ICD-10-CM

## 2018-07-14 ENCOUNTER — Encounter (INDEPENDENT_AMBULATORY_CARE_PROVIDER_SITE_OTHER): Payer: Self-pay | Admitting: Orthopedic Surgery
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
## 2018-07-17 DIAGNOSIS — M47817 Spondylosis without myelopathy or radiculopathy, lumbosacral region: Secondary | ICD-10-CM | POA: Diagnosis not present

## 2018-07-17 DIAGNOSIS — M4807 Spinal stenosis, lumbosacral region: Secondary | ICD-10-CM | POA: Diagnosis not present

## 2018-07-17 DIAGNOSIS — G5602 Carpal tunnel syndrome, left upper limb: Secondary | ICD-10-CM | POA: Diagnosis not present

## 2018-07-17 DIAGNOSIS — G894 Chronic pain syndrome: Secondary | ICD-10-CM | POA: Diagnosis not present

## 2018-08-15 DIAGNOSIS — G5602 Carpal tunnel syndrome, left upper limb: Secondary | ICD-10-CM | POA: Diagnosis not present

## 2018-08-15 DIAGNOSIS — M4807 Spinal stenosis, lumbosacral region: Secondary | ICD-10-CM | POA: Diagnosis not present

## 2018-08-15 DIAGNOSIS — M47817 Spondylosis without myelopathy or radiculopathy, lumbosacral region: Secondary | ICD-10-CM | POA: Diagnosis not present

## 2018-08-15 DIAGNOSIS — G894 Chronic pain syndrome: Secondary | ICD-10-CM | POA: Diagnosis not present

## 2018-09-11 DIAGNOSIS — M4807 Spinal stenosis, lumbosacral region: Secondary | ICD-10-CM | POA: Diagnosis not present

## 2018-09-11 DIAGNOSIS — G5602 Carpal tunnel syndrome, left upper limb: Secondary | ICD-10-CM | POA: Diagnosis not present

## 2018-09-11 DIAGNOSIS — M47817 Spondylosis without myelopathy or radiculopathy, lumbosacral region: Secondary | ICD-10-CM | POA: Diagnosis not present

## 2018-09-11 DIAGNOSIS — G894 Chronic pain syndrome: Secondary | ICD-10-CM | POA: Diagnosis not present

## 2018-10-09 DIAGNOSIS — G894 Chronic pain syndrome: Secondary | ICD-10-CM | POA: Diagnosis not present

## 2018-10-09 DIAGNOSIS — M4807 Spinal stenosis, lumbosacral region: Secondary | ICD-10-CM | POA: Diagnosis not present

## 2018-10-09 DIAGNOSIS — M47817 Spondylosis without myelopathy or radiculopathy, lumbosacral region: Secondary | ICD-10-CM | POA: Diagnosis not present

## 2018-10-09 DIAGNOSIS — G5602 Carpal tunnel syndrome, left upper limb: Secondary | ICD-10-CM | POA: Diagnosis not present

## 2018-11-07 DIAGNOSIS — G894 Chronic pain syndrome: Secondary | ICD-10-CM | POA: Diagnosis not present

## 2018-11-07 DIAGNOSIS — Z79891 Long term (current) use of opiate analgesic: Secondary | ICD-10-CM | POA: Diagnosis not present

## 2018-11-07 DIAGNOSIS — G5602 Carpal tunnel syndrome, left upper limb: Secondary | ICD-10-CM | POA: Diagnosis not present

## 2018-11-07 DIAGNOSIS — M47817 Spondylosis without myelopathy or radiculopathy, lumbosacral region: Secondary | ICD-10-CM | POA: Diagnosis not present

## 2018-11-07 DIAGNOSIS — M4807 Spinal stenosis, lumbosacral region: Secondary | ICD-10-CM | POA: Diagnosis not present

## 2018-12-05 DIAGNOSIS — G894 Chronic pain syndrome: Secondary | ICD-10-CM | POA: Diagnosis not present

## 2018-12-05 DIAGNOSIS — M47817 Spondylosis without myelopathy or radiculopathy, lumbosacral region: Secondary | ICD-10-CM | POA: Diagnosis not present

## 2018-12-05 DIAGNOSIS — G5602 Carpal tunnel syndrome, left upper limb: Secondary | ICD-10-CM | POA: Diagnosis not present

## 2018-12-05 DIAGNOSIS — M4807 Spinal stenosis, lumbosacral region: Secondary | ICD-10-CM | POA: Diagnosis not present

## 2018-12-12 DIAGNOSIS — M47816 Spondylosis without myelopathy or radiculopathy, lumbar region: Secondary | ICD-10-CM | POA: Diagnosis not present

## 2019-01-03 DIAGNOSIS — M47817 Spondylosis without myelopathy or radiculopathy, lumbosacral region: Secondary | ICD-10-CM | POA: Diagnosis not present

## 2019-01-03 DIAGNOSIS — M4807 Spinal stenosis, lumbosacral region: Secondary | ICD-10-CM | POA: Diagnosis not present

## 2019-01-03 DIAGNOSIS — G894 Chronic pain syndrome: Secondary | ICD-10-CM | POA: Diagnosis not present

## 2019-01-03 DIAGNOSIS — G5602 Carpal tunnel syndrome, left upper limb: Secondary | ICD-10-CM | POA: Diagnosis not present

## 2019-01-09 DIAGNOSIS — Z0000 Encounter for general adult medical examination without abnormal findings: Secondary | ICD-10-CM | POA: Diagnosis not present

## 2019-01-09 DIAGNOSIS — F325 Major depressive disorder, single episode, in full remission: Secondary | ICD-10-CM | POA: Diagnosis not present

## 2019-01-09 DIAGNOSIS — M109 Gout, unspecified: Secondary | ICD-10-CM | POA: Diagnosis not present

## 2019-01-09 DIAGNOSIS — G4700 Insomnia, unspecified: Secondary | ICD-10-CM | POA: Diagnosis not present

## 2019-01-09 DIAGNOSIS — Z1389 Encounter for screening for other disorder: Secondary | ICD-10-CM | POA: Diagnosis not present

## 2019-01-09 DIAGNOSIS — E7800 Pure hypercholesterolemia, unspecified: Secondary | ICD-10-CM | POA: Diagnosis not present

## 2019-01-09 DIAGNOSIS — R609 Edema, unspecified: Secondary | ICD-10-CM | POA: Diagnosis not present

## 2019-01-09 DIAGNOSIS — I119 Hypertensive heart disease without heart failure: Secondary | ICD-10-CM | POA: Diagnosis not present

## 2019-01-09 DIAGNOSIS — I2510 Atherosclerotic heart disease of native coronary artery without angina pectoris: Secondary | ICD-10-CM | POA: Diagnosis not present

## 2019-01-09 DIAGNOSIS — M129 Arthropathy, unspecified: Secondary | ICD-10-CM | POA: Diagnosis not present

## 2019-01-09 DIAGNOSIS — Z125 Encounter for screening for malignant neoplasm of prostate: Secondary | ICD-10-CM | POA: Diagnosis not present

## 2019-01-09 DIAGNOSIS — F419 Anxiety disorder, unspecified: Secondary | ICD-10-CM | POA: Diagnosis not present

## 2019-01-11 ENCOUNTER — Telehealth: Payer: Self-pay

## 2019-01-11 NOTE — Telephone Encounter (Signed)
   [facility] Pre-operative Risk Assessment    Request for surgical clearance:  1. What type of surgery is being performed? Lumbar RFA L3, L4, L5   2. When is this surgery scheduled? 01/21/19   3. What type of clearance is required (medical clearance vs. Pharmacy clearance to hold med vs. Both)? Pharmacy   4. Are there any medications that need to be held prior to surgery and how long? Aspirin, hold 7 days prior to the procedure   5. Practice name and name of physician performing surgery? [facility]/ Dr. Corinna Capra   6. What is your office phone number [phone]    7.   What is your office fax number [phone]  8.   Anesthesia type (None, local, [name], general) ? None listed   Jeremiah [name] 01/11/2019, 10:14 AM  _________________________________________________________________   (provider comments below)

## 2019-01-14 NOTE — Telephone Encounter (Signed)
Ok hold ASA 7 days prior to surgery Ok to have surgery

## 2019-01-14 NOTE — Telephone Encounter (Signed)
   Primary Cardiologist: Jenkins Rouge, MD  Chart reviewed as part of pre-operative protocol coverage. Patient was contacted 01/14/2019 in reference to pre-operative risk assessment for pending surgery as outlined below.  Jeremiah [name] was last seen on 03/06/2018 by Dr. Johnsie Cancel.  Since that day, Jeremiah [name] has done well. He has remote hx of CABG in 1998 with no subsequent ischemic issues. Normal myoview in 2014. Currently he denies any chest pain or shortness of breath.  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 No lightheadedness or syncope.  According to Dr. Johnsie Cancel it is OK to hold aspirin for 7 days and OK to proceed with surgery.   Therefore, based on ACC/AHA guidelines, the patient would be at acceptable risk for the planned procedure without further cardiovascular testing.   I will route this recommendation to the requesting party via Epic fax function and remove from pre-op pool.  Please call with questions.  Daune Perch, NP 01/14/2019, 1:46 PM

## 2019-01-21 DIAGNOSIS — M47817 Spondylosis without myelopathy or radiculopathy, lumbosacral region: Secondary | ICD-10-CM | POA: Diagnosis not present

## 2019-01-30 DIAGNOSIS — G894 Chronic pain syndrome: Secondary | ICD-10-CM | POA: Diagnosis not present

## 2019-01-30 DIAGNOSIS — M4807 Spinal stenosis, lumbosacral region: Secondary | ICD-10-CM | POA: Diagnosis not present

## 2019-01-30 DIAGNOSIS — G5602 Carpal tunnel syndrome, left upper limb: Secondary | ICD-10-CM | POA: Diagnosis not present

## 2019-01-30 DIAGNOSIS — M47817 Spondylosis without myelopathy or radiculopathy, lumbosacral region: Secondary | ICD-10-CM | POA: Diagnosis not present

## 2019-02-27 DIAGNOSIS — I4891 Unspecified atrial fibrillation: Secondary | ICD-10-CM | POA: Diagnosis not present

## 2019-02-27 DIAGNOSIS — E7800 Pure hypercholesterolemia, unspecified: Secondary | ICD-10-CM | POA: Diagnosis not present

## 2019-02-27 DIAGNOSIS — Z7901 Long term (current) use of anticoagulants: Secondary | ICD-10-CM | POA: Diagnosis not present

## 2019-02-27 DIAGNOSIS — M47817 Spondylosis without myelopathy or radiculopathy, lumbosacral region: Secondary | ICD-10-CM | POA: Diagnosis not present

## 2019-02-27 DIAGNOSIS — M25562 Pain in left knee: Secondary | ICD-10-CM | POA: Diagnosis not present

## 2019-02-27 DIAGNOSIS — G5602 Carpal tunnel syndrome, left upper limb: Secondary | ICD-10-CM | POA: Diagnosis not present

## 2019-02-27 DIAGNOSIS — M4800 Spinal stenosis, site unspecified: Secondary | ICD-10-CM | POA: Diagnosis not present

## 2019-02-27 DIAGNOSIS — M5136 Other intervertebral disc degeneration, lumbar region: Secondary | ICD-10-CM | POA: Diagnosis not present

## 2019-02-27 DIAGNOSIS — Z4800 Encounter for change or removal of nonsurgical wound dressing: Secondary | ICD-10-CM | POA: Diagnosis not present

## 2019-02-27 DIAGNOSIS — K219 Gastro-esophageal reflux disease without esophagitis: Secondary | ICD-10-CM | POA: Diagnosis not present

## 2019-02-27 DIAGNOSIS — N390 Urinary tract infection, site not specified: Secondary | ICD-10-CM | POA: Diagnosis not present

## 2019-02-27 DIAGNOSIS — C541 Malignant neoplasm of endometrium: Secondary | ICD-10-CM | POA: Diagnosis not present

## 2019-02-27 DIAGNOSIS — E039 Hypothyroidism, unspecified: Secondary | ICD-10-CM | POA: Diagnosis not present

## 2019-02-27 DIAGNOSIS — Z79899 Other long term (current) drug therapy: Secondary | ICD-10-CM | POA: Diagnosis not present

## 2019-02-27 DIAGNOSIS — Z1389 Encounter for screening for other disorder: Secondary | ICD-10-CM | POA: Diagnosis not present

## 2019-02-27 DIAGNOSIS — N183 Chronic kidney disease, stage 3 unspecified: Secondary | ICD-10-CM | POA: Diagnosis not present

## 2019-02-27 DIAGNOSIS — E1122 Type 2 diabetes mellitus with diabetic chronic kidney disease: Secondary | ICD-10-CM | POA: Diagnosis not present

## 2019-02-27 DIAGNOSIS — N400 Enlarged prostate without lower urinary tract symptoms: Secondary | ICD-10-CM | POA: Diagnosis not present

## 2019-02-27 DIAGNOSIS — G894 Chronic pain syndrome: Secondary | ICD-10-CM | POA: Diagnosis not present

## 2019-02-27 DIAGNOSIS — F17210 Nicotine dependence, cigarettes, uncomplicated: Secondary | ICD-10-CM | POA: Diagnosis not present

## 2019-02-27 DIAGNOSIS — M5412 Radiculopathy, cervical region: Secondary | ICD-10-CM | POA: Diagnosis not present

## 2019-02-27 DIAGNOSIS — Z01812 Encounter for preprocedural laboratory examination: Secondary | ICD-10-CM | POA: Diagnosis not present

## 2019-02-27 DIAGNOSIS — I4821 Permanent atrial fibrillation: Secondary | ICD-10-CM | POA: Diagnosis not present

## 2019-02-27 DIAGNOSIS — Z6838 Body mass index (BMI) 38.0-38.9, adult: Secondary | ICD-10-CM | POA: Diagnosis not present

## 2019-02-27 DIAGNOSIS — I10 Essential (primary) hypertension: Secondary | ICD-10-CM | POA: Diagnosis not present

## 2019-02-27 DIAGNOSIS — F0151 Vascular dementia with behavioral disturbance: Secondary | ICD-10-CM | POA: Diagnosis not present

## 2019-02-27 DIAGNOSIS — Z794 Long term (current) use of insulin: Secondary | ICD-10-CM | POA: Diagnosis not present

## 2019-02-27 DIAGNOSIS — D508 Other iron deficiency anemias: Secondary | ICD-10-CM | POA: Diagnosis not present

## 2019-02-27 DIAGNOSIS — M47816 Spondylosis without myelopathy or radiculopathy, lumbar region: Secondary | ICD-10-CM | POA: Diagnosis not present

## 2019-02-27 DIAGNOSIS — S81811S Laceration without foreign body, right lower leg, sequela: Secondary | ICD-10-CM | POA: Diagnosis not present

## 2019-02-27 DIAGNOSIS — D638 Anemia in other chronic diseases classified elsewhere: Secondary | ICD-10-CM | POA: Diagnosis not present

## 2019-02-27 DIAGNOSIS — M4807 Spinal stenosis, lumbosacral region: Secondary | ICD-10-CM | POA: Diagnosis not present

## 2019-02-27 DIAGNOSIS — E782 Mixed hyperlipidemia: Secondary | ICD-10-CM | POA: Diagnosis not present

## 2019-02-27 DIAGNOSIS — R109 Unspecified abdominal pain: Secondary | ICD-10-CM | POA: Diagnosis not present

## 2019-02-27 DIAGNOSIS — R7301 Impaired fasting glucose: Secondary | ICD-10-CM | POA: Diagnosis not present

## 2019-02-27 DIAGNOSIS — E1159 Type 2 diabetes mellitus with other circulatory complications: Secondary | ICD-10-CM | POA: Diagnosis not present

## 2019-02-28 ENCOUNTER — Telehealth: Payer: Self-pay

## 2019-02-28 NOTE — Telephone Encounter (Signed)
Spoke with pt who has consented and done trial run of doximity with appt on 4/14 at 9 am. tPt is aware that he will receive a phone call 30 minutes prior to schedule appt to go over vitals signs, allergies and needs. Pt verbalized understanding and thanked me for the call.

## 2019-02-28 NOTE — Telephone Encounter (Signed)
Patient is returning your call.  

## 2019-02-28 NOTE — Telephone Encounter (Signed)
Virtual Visit Pre-Appointment Phone Call  Steps For Call:  1. Confirm consent - "In the setting of the current Covid19 crisis, you are scheduled for a (phone or video) visit with your provider on (date) at (time).  Just as we do with many in-office visits, in order for you to participate in this visit, we must obtain consent.  If you'd like, I can send this to your mychart (if signed up) or email for you to review.  Otherwise, I can obtain your verbal consent now.  All virtual visits are billed to your insurance company just like a normal visit would be.  By agreeing to a virtual visit, we'd like you to understand that the technology does not allow for your provider to perform an examination, and thus may limit your provider's ability to fully assess your condition.  Finally, though the technology is pretty good, we cannot assure that it will always work on either your or our end, and in the setting of a video visit, we may have to convert it to a phone-only visit.  In either situation, we cannot ensure that we have a secure connection.  Are you willing to proceed?"  2. Give patient instructions for WebEx download to smartphone as below if video visit  3. Advise patient to be prepared with any vital sign or heart rhythm information, their current medicines, and a piece of paper and pen handy for any instructions they may receive the day of their visit  4. Inform patient they will receive a phone call 15 minutes prior to their appointment time (may be from unknown caller ID) so they should be prepared to answer  5. Confirm that appointment type is correct in Epic appointment notes (video vs telephone)    TELEPHONE CALL NOTE  Jeremiah [name] has been deemed a candidate for a follow-up tele-health visit to limit community exposure during the Covid-19 pandemic. I spoke with the patient via phone to ensure availability of phone/video source, confirm preferred email & phone number, and discuss  instructions and expectations.  I reminded Jeremiah [name] to be prepared with any vital sign and/or heart rhythm information that could potentially be obtained via home monitoring, at the time of his visit. I reminded Jeremiah [name] to expect a phone call at the time of his visit if his visit.  Did the patient verbally acknowledge consent to treatment? Yes  Jeremiah [name], [place] 02/28/2019 3:46 PM   DOWNLOADING THE [place], go to [employer] and type in WebEx in the search bar. [place] [employer], the blue/green circle. The app is free but as with any other app downloads, their phone may require them to verify saved payment information or Apple password. The patient does NOT have to create an account.  - If Android, ask patient to go to [employer] and type in WebEx in the search bar. [place] [employer], the blue/green circle. The app is free but as with any other app downloads, their phone may require them to verify saved payment information or Android password. The patient does NOT have to create an account.   CONSENT FOR TELE-HEALTH VISIT - PLEASE REVIEW  I hereby voluntarily request, consent and authorize [facility] and its employed or contracted physicians, physician assistants, nurse practitioners or other licensed health care professionals (the Practitioner), to provide me with telemedicine health care services (the "Services") as deemed necessary by the treating Practitioner.  I acknowledge and consent to receive the Services by the Practitioner via telemedicine. I understand that the telemedicine visit will involve communicating with the Practitioner through live audiovisual communication technology and the disclosure of certain medical information by electronic transmission. I acknowledge that I have been given the opportunity to request an in-person assessment or other available alternative prior to the telemedicine visit and am  voluntarily participating in the telemedicine visit.  I understand that I have the right to withhold or withdraw my consent to the use of telemedicine in the course of my care at any time, without affecting my right to future care or treatment, and that the Practitioner or I may terminate the telemedicine visit at any time. I understand that I have the right to inspect all information obtained and/or recorded in the course of the telemedicine visit and may receive copies of available information for a reasonable fee.  I understand that some of the potential risks of receiving the Services via telemedicine include:  [name] Delay or interruption in medical evaluation due to technological equipment failure or disruption; . Information transmitted may not be sufficient (e.g. poor resolution of images) to allow for appropriate medical decision making by the Practitioner; and/or  . In rare instances, security protocols could fail, causing a breach of personal health information.  Furthermore, I acknowledge that it is my responsibility to provide information about my medical history, conditions and care that is complete and accurate to the best of my ability. I acknowledge that Practitioner's advice, recommendations, and/or decision may be based on factors not within their control, such as incomplete or inaccurate data provided by me or distortions of diagnostic images or specimens that may result from electronic transmissions. I understand that the practice of medicine is not an exact science and that Practitioner makes no warranties or guarantees regarding treatment outcomes. I acknowledge that I will receive a copy of this consent concurrently upon execution via email to the email address I last provided but may also request a printed copy by calling the office of [place].    I understand that my insurance will be billed for this visit.   I have read or had this consent read to me. . I understand the  contents of this consent, which adequately explains the benefits and risks of the Services being provided via telemedicine.  . I have been provided ample opportunity to ask questions regarding this consent and the Services and have had my questions answered to my satisfaction. . I give my informed consent for the services to be provided through the use of telemedicine in my medical care  By participating in this telemedicine visit I agree to the above.

## 2019-02-28 NOTE — Telephone Encounter (Signed)
I left a message for the patient to return my call about appt with Dr. Johnsie Cancel.
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
## 2019-03-05 ENCOUNTER — Encounter: Payer: Self-pay | Admitting: Cardiovascular Disease

## 2019-03-05 ENCOUNTER — Other Ambulatory Visit: Payer: Self-pay

## 2019-03-05 ENCOUNTER — Telehealth (INDEPENDENT_AMBULATORY_CARE_PROVIDER_SITE_OTHER): Payer: PPO | Admitting: Cardiovascular Disease

## 2019-03-05 DIAGNOSIS — I25810 Atherosclerosis of coronary artery bypass graft(s) without angina pectoris: Secondary | ICD-10-CM | POA: Diagnosis not present

## 2019-03-05 NOTE — Patient Instructions (Addendum)

## 2019-03-07 ENCOUNTER — Ambulatory Visit: Payer: PPO | Admitting: Cardiovascular Disease

## 2019-03-15 DIAGNOSIS — J301 Allergic rhinitis due to pollen: Secondary | ICD-10-CM | POA: Diagnosis not present

## 2019-03-15 DIAGNOSIS — J209 Acute bronchitis, unspecified: Secondary | ICD-10-CM | POA: Diagnosis not present

## 2019-03-28 DIAGNOSIS — M47817 Spondylosis without myelopathy or radiculopathy, lumbosacral region: Secondary | ICD-10-CM | POA: Diagnosis not present

## 2019-03-28 DIAGNOSIS — M4807 Spinal stenosis, lumbosacral region: Secondary | ICD-10-CM | POA: Diagnosis not present

## 2019-03-28 DIAGNOSIS — G894 Chronic pain syndrome: Secondary | ICD-10-CM | POA: Diagnosis not present

## 2019-03-28 DIAGNOSIS — G5601 Carpal tunnel syndrome, right upper limb: Secondary | ICD-10-CM | POA: Diagnosis not present

## 2019-04-25 DIAGNOSIS — G5601 Carpal tunnel syndrome, right upper limb: Secondary | ICD-10-CM | POA: Diagnosis not present

## 2019-04-25 DIAGNOSIS — M47817 Spondylosis without myelopathy or radiculopathy, lumbosacral region: Secondary | ICD-10-CM | POA: Diagnosis not present

## 2019-04-25 DIAGNOSIS — G894 Chronic pain syndrome: Secondary | ICD-10-CM | POA: Diagnosis not present

## 2019-04-25 DIAGNOSIS — M4807 Spinal stenosis, lumbosacral region: Secondary | ICD-10-CM | POA: Diagnosis not present

## 2019-05-01 IMAGING — CT CT SHOULDER*L* W/O CM
1 of 2 series · 10 of 14 positions shown, 13 images · non-contrast
Comparison: Left shoulder MRI dated March 27, 2016. Left shoulder
x-rays dated April 01, 2015.

CLINICAL DATA: Osteoarthritis. Preoperative study prior to shoulder
replacement.

EXAM:
CT OF THE UPPER LEFT EXTREMITY WITHOUT CONTRAST
TECHNIQUE: Multidetector CT imaging of the upper left extremity was performed
according to the standard protocol.

[Series 2: soft tissue · axial · 0.53mm/px · z∈[+360,+516]mm · 10 of 96 slices shown, 13 images]
[im 9/96  soft-tissue]
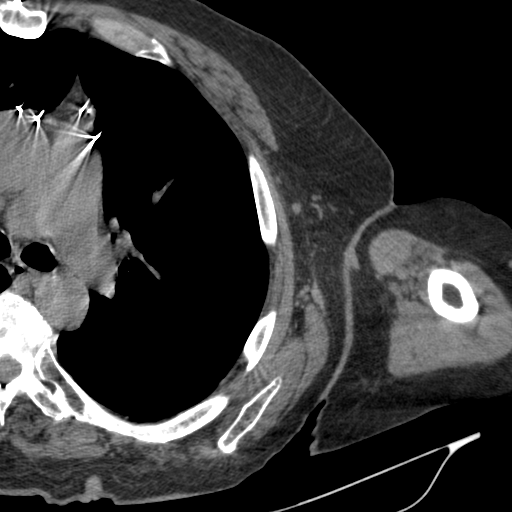
[im 9/96  bone]
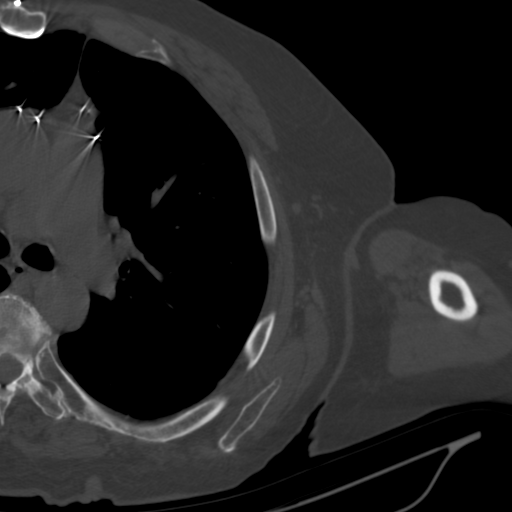
[im 18/96  bone]
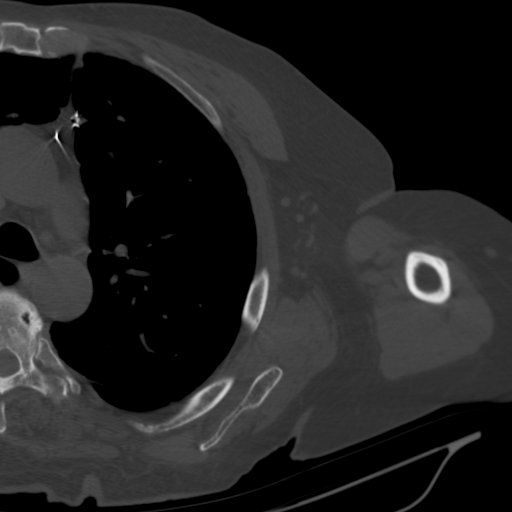
[im 26/96  bone]
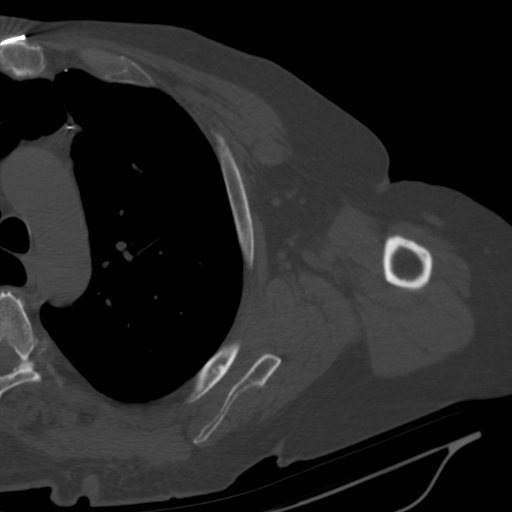
[im 35/96  bone]
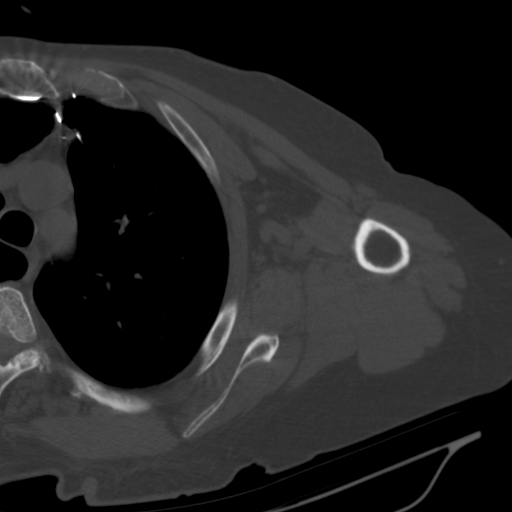
[im 44/96  soft-tissue]
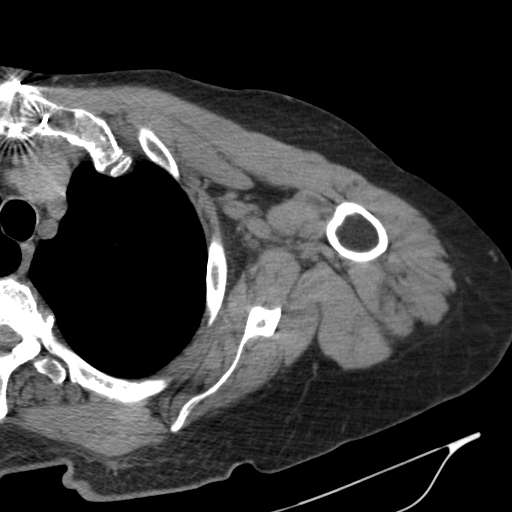
[im 44/96  bone]
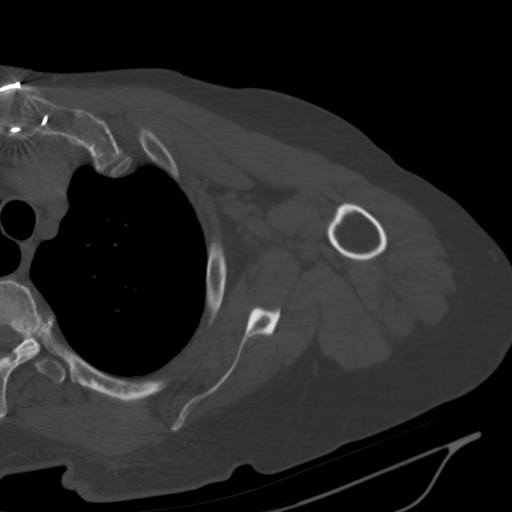
[im 52/96  bone]
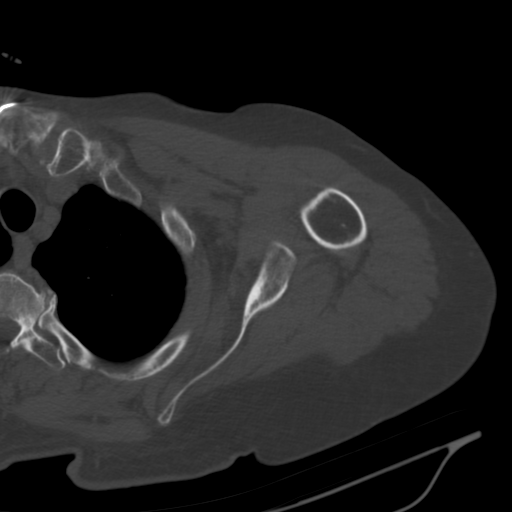
[im 61/96  bone]
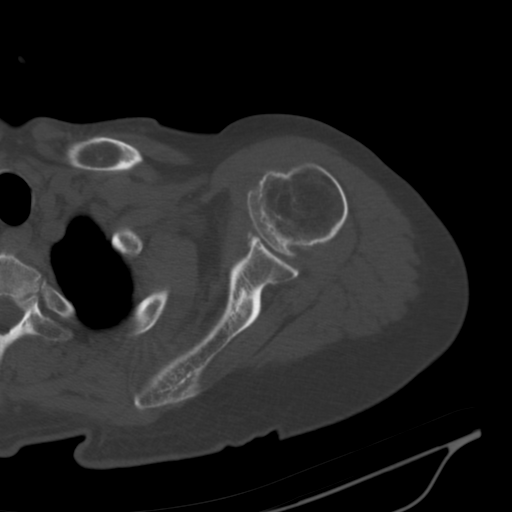
[im 70/96  bone]
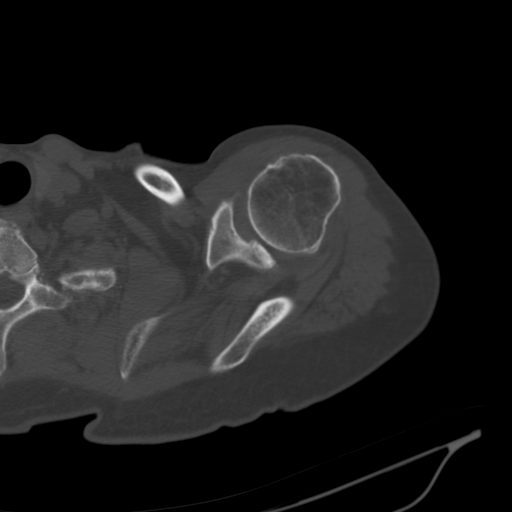
[im 78/96  soft-tissue]
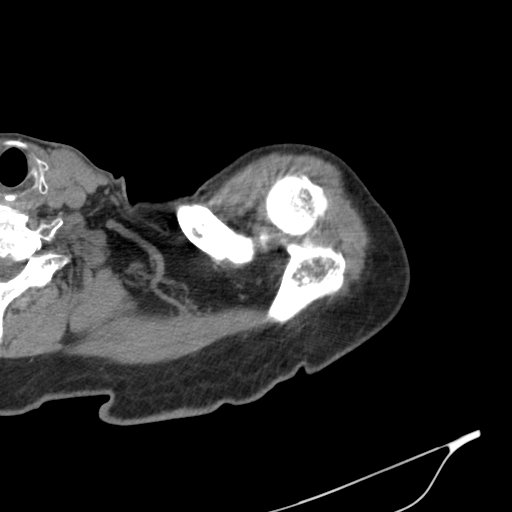
[im 78/96  bone]
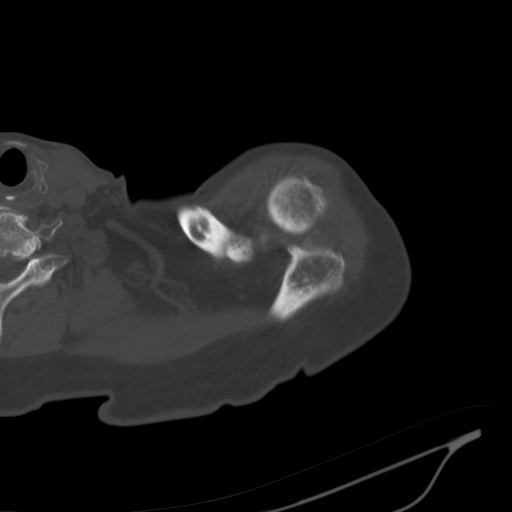
[im 87/96  bone]
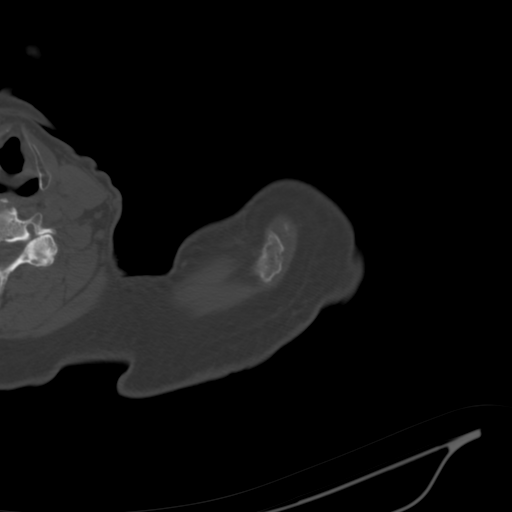

[10 of 14 positions shown; findings below may reference images not displayed]

FINDINGS: Bones/Joint/Cartilage

No fracture or dislocation. Mild anterior subluxation of the humeral
head. No joint effusion.

Moderate osteoarthritis of the glenohumeral joint with moderate
joint space narrowing and marginal osteophytosis.

Moderate arthropathy of the acromioclavicular joint. Type II
acromion.

Ligaments

Ligaments are suboptimally evaluated by CT.

Muscles and Tendons
Narrowing of the acromiohumeral interval secondary to massive
rotator cuff tendon tear, unchanged. Small amount of calcification
along the distal infraspinatus tendon. Mild fatty atrophy of the
supraspinatus, infraspinatus, and subscapularis muscles.

Soft tissue
No fluid collection or hematoma. No soft tissue mass. The visualized
left lung is clear.
IMPRESSION: 1. Moderate glenohumeral osteoarthritis with mild anterior
subluxation of the humeral head.
2. Moderate acromioclavicular osteoarthritis.
3. Narrowing of the acromiohumeral interval secondary to massive
rotator cuff tendon tear, unchanged.

## 2019-05-23 DIAGNOSIS — G5601 Carpal tunnel syndrome, right upper limb: Secondary | ICD-10-CM | POA: Diagnosis not present

## 2019-05-23 DIAGNOSIS — M4807 Spinal stenosis, lumbosacral region: Secondary | ICD-10-CM | POA: Diagnosis not present

## 2019-05-23 DIAGNOSIS — G894 Chronic pain syndrome: Secondary | ICD-10-CM | POA: Diagnosis not present

## 2019-05-23 DIAGNOSIS — M47817 Spondylosis without myelopathy or radiculopathy, lumbosacral region: Secondary | ICD-10-CM | POA: Diagnosis not present

## 2019-05-23 DIAGNOSIS — Z79891 Long term (current) use of opiate analgesic: Secondary | ICD-10-CM | POA: Diagnosis not present

## 2019-06-25 DIAGNOSIS — G894 Chronic pain syndrome: Secondary | ICD-10-CM | POA: Diagnosis not present

## 2019-06-25 DIAGNOSIS — M47817 Spondylosis without myelopathy or radiculopathy, lumbosacral region: Secondary | ICD-10-CM | POA: Diagnosis not present

## 2019-06-25 DIAGNOSIS — G5601 Carpal tunnel syndrome, right upper limb: Secondary | ICD-10-CM | POA: Diagnosis not present

## 2019-06-25 DIAGNOSIS — M4807 Spinal stenosis, lumbosacral region: Secondary | ICD-10-CM | POA: Diagnosis not present

## 2019-06-30 IMAGING — DX DG CHEST 2V
2 series · 2 of 2 positions shown · non-contrast
Comparison: July 16, 2013

CLINICAL DATA: Chest pain and dizziness

EXAM:
CHEST - 2 VIEW

[w chest pa]
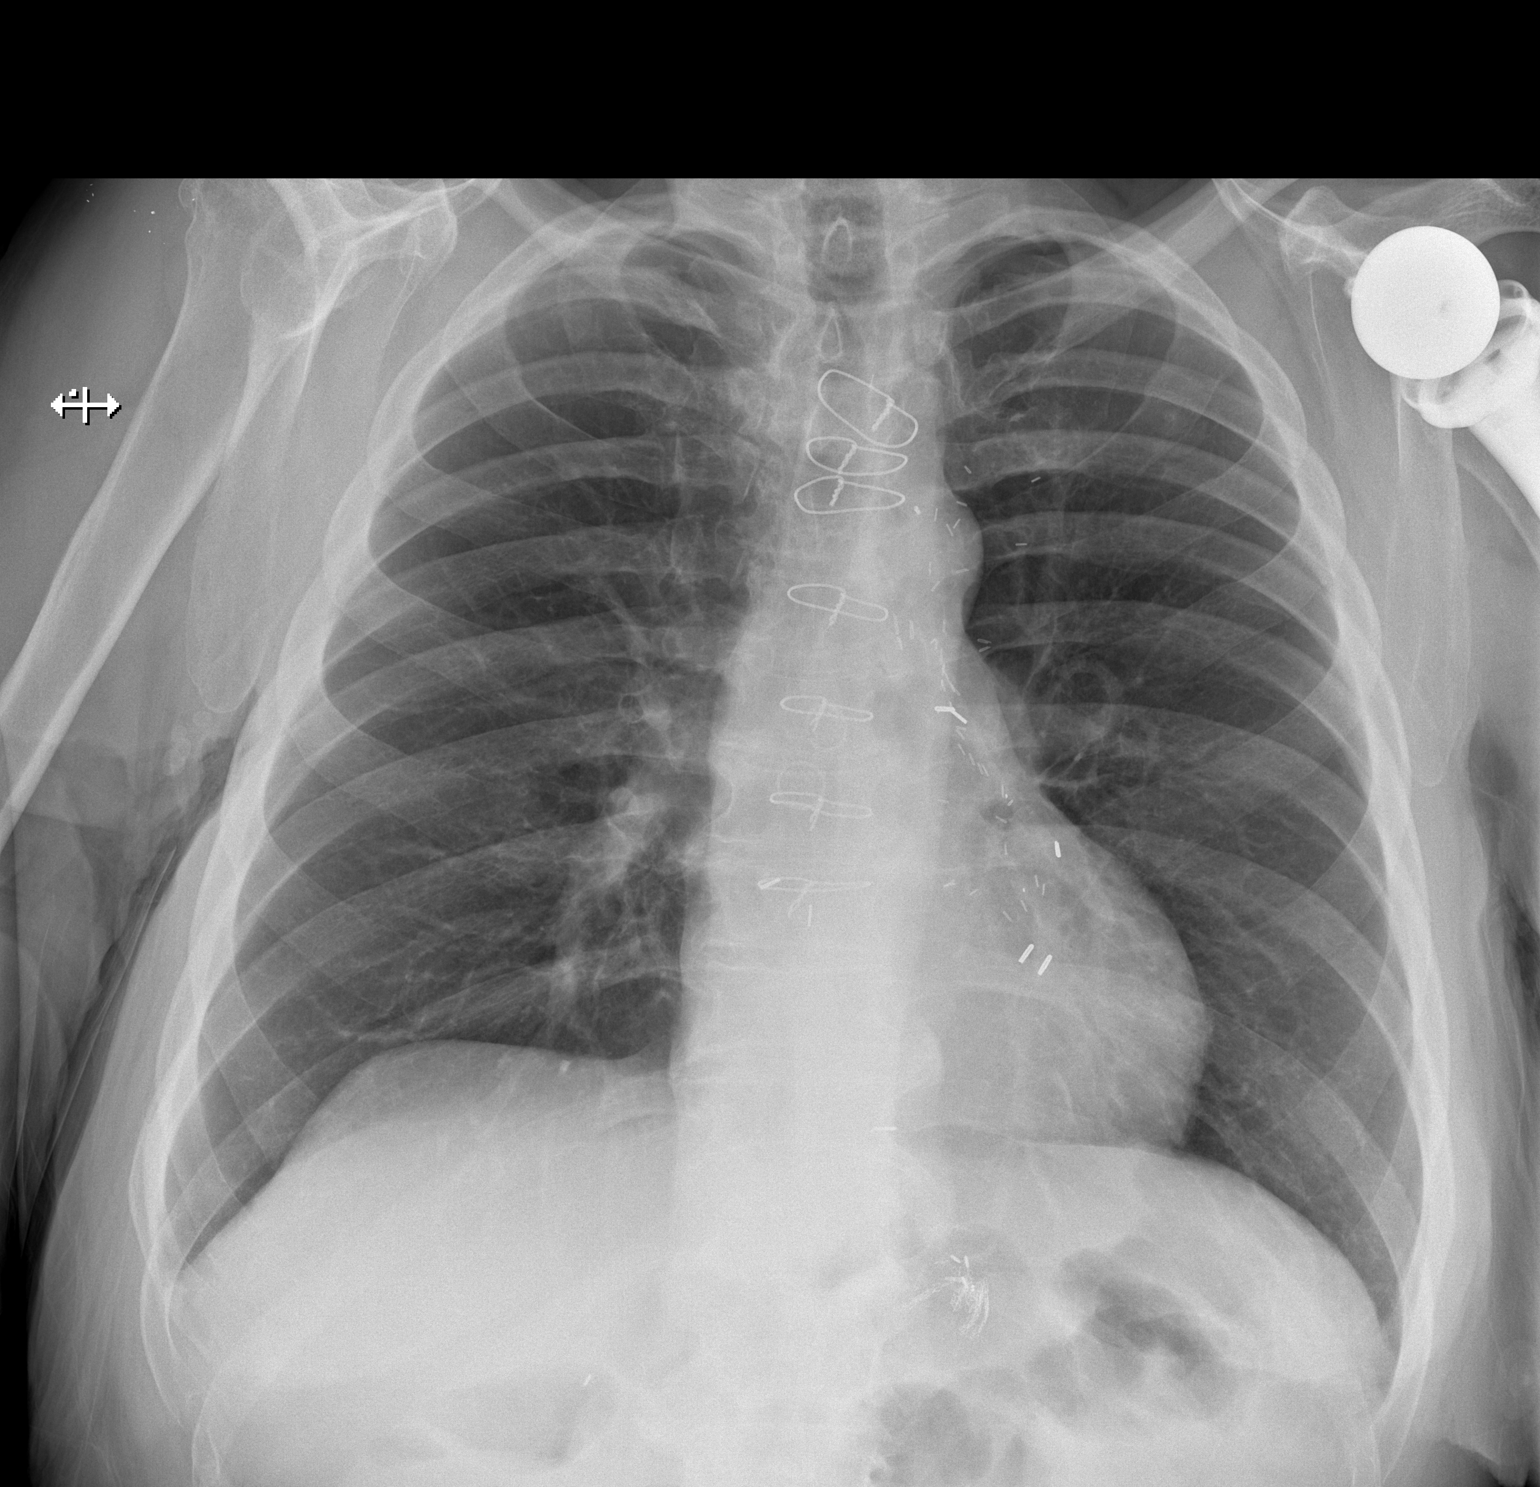

[w chest lat]
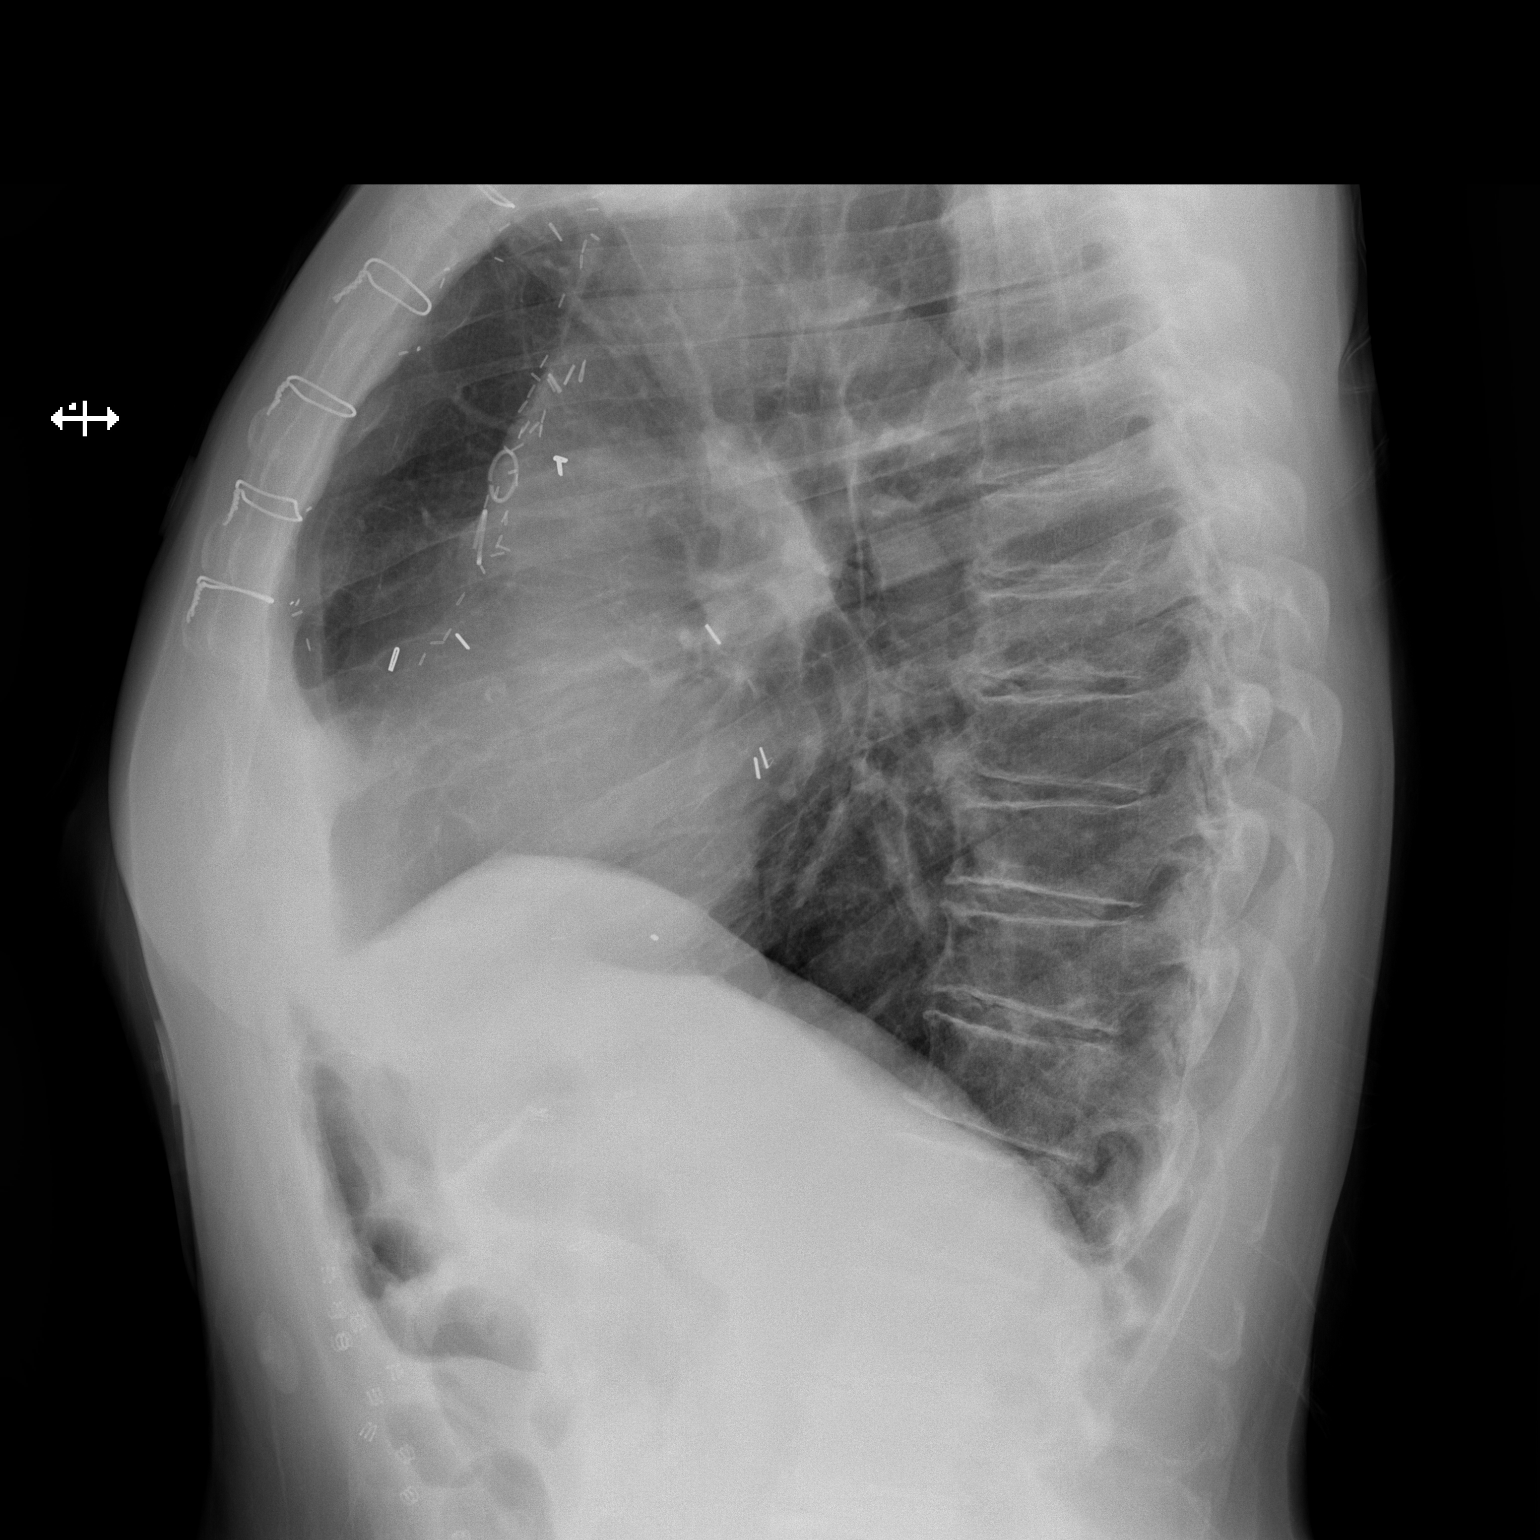

[2 of 2 positions shown; findings below may reference images not displayed]

FINDINGS: There is no edema or consolidation. The heart size and pulmonary
vascularity are normal. No adenopathy. There is degenerative change
in thoracic spine. Slight anterior wedging of lower thoracic
vertebral bodies is stable. There is a total shoulder replacement on
the left. Patient is status post coronary artery bypass grafting.
IMPRESSION: No edema or consolidation.  Stable cardiac silhouette.

## 2019-07-18 DIAGNOSIS — G8929 Other chronic pain: Secondary | ICD-10-CM | POA: Diagnosis not present

## 2019-07-18 DIAGNOSIS — R5383 Other fatigue: Secondary | ICD-10-CM | POA: Diagnosis not present

## 2019-07-18 DIAGNOSIS — E7800 Pure hypercholesterolemia, unspecified: Secondary | ICD-10-CM | POA: Diagnosis not present

## 2019-07-18 DIAGNOSIS — G4700 Insomnia, unspecified: Secondary | ICD-10-CM | POA: Diagnosis not present

## 2019-07-18 DIAGNOSIS — F419 Anxiety disorder, unspecified: Secondary | ICD-10-CM | POA: Diagnosis not present

## 2019-07-18 DIAGNOSIS — F325 Major depressive disorder, single episode, in full remission: Secondary | ICD-10-CM | POA: Diagnosis not present

## 2019-07-18 DIAGNOSIS — I10 Essential (primary) hypertension: Secondary | ICD-10-CM | POA: Diagnosis not present

## 2019-07-18 DIAGNOSIS — M109 Gout, unspecified: Secondary | ICD-10-CM | POA: Diagnosis not present

## 2019-07-18 DIAGNOSIS — I2510 Atherosclerotic heart disease of native coronary artery without angina pectoris: Secondary | ICD-10-CM | POA: Diagnosis not present

## 2019-07-20 ENCOUNTER — Telehealth: Payer: Self-pay | Admitting: Medical

## 2019-07-20 MED ORDER — LABETALOL HCL 100 MG PO TABS: 100.0000 mg | ORAL_TABLET | ORAL | 3 refills | Status: DC | PRN

## 2019-07-20 MED ORDER — LISINOPRIL 10 MG PO TABS: 10.0000 mg | ORAL_TABLET | ORAL | 3 refills | Status: DC | PRN

## 2019-07-20 NOTE — Telephone Encounter (Signed)
   Patient called the after hours line with complaints of elevated blood pressures. He reports BP 178/98. He normally takes labetalol 100mg  and lisinopril 10mg  as needed for HTN. He reports BP is often in the 100s/80s which is why he only takes medications as needed. No complaints of headache, blurry visit, or chest pain. Will send refill for medications above to his pharmacy. Patient to see his PCP on Thursday for close follow-up.   Abigail Butts, PA-C 07/20/19; 4:16 PM

## 2019-07-24 DIAGNOSIS — M4807 Spinal stenosis, lumbosacral region: Secondary | ICD-10-CM | POA: Diagnosis not present

## 2019-07-24 DIAGNOSIS — G894 Chronic pain syndrome: Secondary | ICD-10-CM | POA: Diagnosis not present

## 2019-07-24 DIAGNOSIS — R5383 Other fatigue: Secondary | ICD-10-CM | POA: Diagnosis not present

## 2019-07-24 DIAGNOSIS — I10 Essential (primary) hypertension: Secondary | ICD-10-CM | POA: Diagnosis not present

## 2019-07-24 DIAGNOSIS — G5601 Carpal tunnel syndrome, right upper limb: Secondary | ICD-10-CM | POA: Diagnosis not present

## 2019-07-24 DIAGNOSIS — M47817 Spondylosis without myelopathy or radiculopathy, lumbosacral region: Secondary | ICD-10-CM | POA: Diagnosis not present

## 2019-08-19 DIAGNOSIS — I10 Essential (primary) hypertension: Secondary | ICD-10-CM | POA: Diagnosis not present

## 2019-08-19 DIAGNOSIS — I119 Hypertensive heart disease without heart failure: Secondary | ICD-10-CM | POA: Diagnosis not present

## 2019-08-19 DIAGNOSIS — F325 Major depressive disorder, single episode, in full remission: Secondary | ICD-10-CM | POA: Diagnosis not present

## 2019-08-19 DIAGNOSIS — F329 Major depressive disorder, single episode, unspecified: Secondary | ICD-10-CM | POA: Diagnosis not present

## 2019-08-19 DIAGNOSIS — I2510 Atherosclerotic heart disease of native coronary artery without angina pectoris: Secondary | ICD-10-CM | POA: Diagnosis not present

## 2019-08-22 DIAGNOSIS — G5601 Carpal tunnel syndrome, right upper limb: Secondary | ICD-10-CM | POA: Diagnosis not present

## 2019-08-22 DIAGNOSIS — M4807 Spinal stenosis, lumbosacral region: Secondary | ICD-10-CM | POA: Diagnosis not present

## 2019-08-22 DIAGNOSIS — M47817 Spondylosis without myelopathy or radiculopathy, lumbosacral region: Secondary | ICD-10-CM | POA: Diagnosis not present

## 2019-08-22 DIAGNOSIS — G894 Chronic pain syndrome: Secondary | ICD-10-CM | POA: Diagnosis not present

## 2019-08-29 DIAGNOSIS — G4700 Insomnia, unspecified: Secondary | ICD-10-CM | POA: Diagnosis not present

## 2019-09-04 ENCOUNTER — Ambulatory Visit (INDEPENDENT_AMBULATORY_CARE_PROVIDER_SITE_OTHER): Payer: PPO

## 2019-09-04 ENCOUNTER — Ambulatory Visit (INDEPENDENT_AMBULATORY_CARE_PROVIDER_SITE_OTHER): Payer: PPO | Admitting: Orthopaedic Surgery

## 2019-09-04 ENCOUNTER — Encounter: Payer: Self-pay | Admitting: Orthopaedic Surgery

## 2019-09-04 ENCOUNTER — Other Ambulatory Visit: Payer: Self-pay

## 2019-09-04 DIAGNOSIS — M25572 Pain in left ankle and joints of left foot: Secondary | ICD-10-CM

## 2019-09-04 MED ORDER — LIDOCAINE HCL 1 % IJ SOLN
1.0000 mL | INTRAMUSCULAR | Status: AC | PRN
  Administered 2019-09-04: 1 mL

## 2019-09-04 MED ORDER — BUPIVACAINE HCL 0.5 % IJ SOLN
1.0000 mL | INTRAMUSCULAR | Status: AC | PRN
  Administered 2019-09-04: 1 mL via INTRA_ARTICULAR

## 2019-09-04 MED ORDER — METHYLPREDNISOLONE ACETATE 40 MG/ML IJ SUSP
40.0000 mg | INTRAMUSCULAR | Status: AC | PRN
  Administered 2019-09-04: 40 mg via INTRA_ARTICULAR
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
## 2019-09-16 ENCOUNTER — Ambulatory Visit: Payer: PPO | Admitting: Cardiovascular Disease

## 2019-09-16 ENCOUNTER — Encounter: Payer: Self-pay | Admitting: Cardiovascular Disease

## 2019-09-16 ENCOUNTER — Other Ambulatory Visit: Payer: Self-pay

## 2019-09-16 VITALS — BP 110/72 | HR 73 | Ht 66.5 in | Wt 244.0 lb

## 2019-09-16 DIAGNOSIS — I25810 Atherosclerosis of coronary artery bypass graft(s) without angina pectoris: Secondary | ICD-10-CM | POA: Diagnosis not present

## 2019-09-16 DIAGNOSIS — Z951 Presence of aortocoronary bypass graft: Secondary | ICD-10-CM

## 2019-09-16 NOTE — Patient Instructions (Signed)

## 2019-09-18 DIAGNOSIS — I10 Essential (primary) hypertension: Secondary | ICD-10-CM | POA: Diagnosis not present

## 2019-09-18 DIAGNOSIS — F329 Major depressive disorder, single episode, unspecified: Secondary | ICD-10-CM | POA: Diagnosis not present

## 2019-09-18 DIAGNOSIS — I2510 Atherosclerotic heart disease of native coronary artery without angina pectoris: Secondary | ICD-10-CM | POA: Diagnosis not present

## 2019-09-18 DIAGNOSIS — F325 Major depressive disorder, single episode, in full remission: Secondary | ICD-10-CM | POA: Diagnosis not present

## 2019-09-18 DIAGNOSIS — E7800 Pure hypercholesterolemia, unspecified: Secondary | ICD-10-CM | POA: Diagnosis not present

## 2019-09-18 DIAGNOSIS — I119 Hypertensive heart disease without heart failure: Secondary | ICD-10-CM | POA: Diagnosis not present

## 2019-09-19 DIAGNOSIS — M4807 Spinal stenosis, lumbosacral region: Secondary | ICD-10-CM | POA: Diagnosis not present

## 2019-09-19 DIAGNOSIS — G5601 Carpal tunnel syndrome, right upper limb: Secondary | ICD-10-CM | POA: Diagnosis not present

## 2019-09-19 DIAGNOSIS — M47817 Spondylosis without myelopathy or radiculopathy, lumbosacral region: Secondary | ICD-10-CM | POA: Diagnosis not present

## 2019-09-19 DIAGNOSIS — G894 Chronic pain syndrome: Secondary | ICD-10-CM | POA: Diagnosis not present

## 2019-10-01 DIAGNOSIS — G4700 Insomnia, unspecified: Secondary | ICD-10-CM | POA: Diagnosis not present

## 2019-10-14 DIAGNOSIS — M4807 Spinal stenosis, lumbosacral region: Secondary | ICD-10-CM | POA: Diagnosis not present

## 2019-10-14 DIAGNOSIS — M47817 Spondylosis without myelopathy or radiculopathy, lumbosacral region: Secondary | ICD-10-CM | POA: Diagnosis not present

## 2019-10-14 DIAGNOSIS — G5601 Carpal tunnel syndrome, right upper limb: Secondary | ICD-10-CM | POA: Diagnosis not present

## 2019-10-14 DIAGNOSIS — G894 Chronic pain syndrome: Secondary | ICD-10-CM | POA: Diagnosis not present

## 2019-11-12 DIAGNOSIS — M47817 Spondylosis without myelopathy or radiculopathy, lumbosacral region: Secondary | ICD-10-CM | POA: Diagnosis not present

## 2019-11-12 DIAGNOSIS — G894 Chronic pain syndrome: Secondary | ICD-10-CM | POA: Diagnosis not present

## 2019-11-12 DIAGNOSIS — G5601 Carpal tunnel syndrome, right upper limb: Secondary | ICD-10-CM | POA: Diagnosis not present

## 2019-11-12 DIAGNOSIS — M4807 Spinal stenosis, lumbosacral region: Secondary | ICD-10-CM | POA: Diagnosis not present

## 2019-11-20 DIAGNOSIS — J209 Acute bronchitis, unspecified: Secondary | ICD-10-CM | POA: Diagnosis not present

## 2019-12-11 DIAGNOSIS — M47817 Spondylosis without myelopathy or radiculopathy, lumbosacral region: Secondary | ICD-10-CM | POA: Diagnosis not present

## 2019-12-11 DIAGNOSIS — G894 Chronic pain syndrome: Secondary | ICD-10-CM | POA: Diagnosis not present

## 2019-12-11 DIAGNOSIS — M4807 Spinal stenosis, lumbosacral region: Secondary | ICD-10-CM | POA: Diagnosis not present

## 2019-12-11 DIAGNOSIS — G5601 Carpal tunnel syndrome, right upper limb: Secondary | ICD-10-CM | POA: Diagnosis not present

## 2019-12-19 DIAGNOSIS — E7800 Pure hypercholesterolemia, unspecified: Secondary | ICD-10-CM | POA: Diagnosis not present

## 2019-12-19 DIAGNOSIS — I119 Hypertensive heart disease without heart failure: Secondary | ICD-10-CM | POA: Diagnosis not present

## 2019-12-19 DIAGNOSIS — I10 Essential (primary) hypertension: Secondary | ICD-10-CM | POA: Diagnosis not present

## 2019-12-19 DIAGNOSIS — I2510 Atherosclerotic heart disease of native coronary artery without angina pectoris: Secondary | ICD-10-CM | POA: Diagnosis not present

## 2019-12-19 DIAGNOSIS — F329 Major depressive disorder, single episode, unspecified: Secondary | ICD-10-CM | POA: Diagnosis not present

## 2019-12-19 DIAGNOSIS — F325 Major depressive disorder, single episode, in full remission: Secondary | ICD-10-CM | POA: Diagnosis not present

## 2020-01-04 ENCOUNTER — Ambulatory Visit: Payer: PPO | Attending: Internal Medicine

## 2020-01-04 ENCOUNTER — Other Ambulatory Visit: Payer: Self-pay

## 2020-01-04 DIAGNOSIS — Z23 Encounter for immunization: Secondary | ICD-10-CM

## 2020-01-04 NOTE — Progress Notes (Signed)
   Covid-19 Vaccination Clinic  Name:  Jeremiah [name]    MRN: [id number] DOB: Jul 30, 1951  01/04/2020  [name] was observed post Covid-19 immunization for 15 minutes without incidence. He was provided with Vaccine Information Sheet and instruction to access the V-Safe system.   [name] was instructed to call 911 with any severe reactions post vaccine: [name] Difficulty breathing  . Swelling of your face and throat  . A fast heartbeat  . A bad rash all over your body  . Dizziness and weakness    Immunizations Administered    Name Date Dose VIS Date Route   Moderna COVID-19 Vaccine 01/04/2020 11:35 AM 0.5 mL 10/22/2019 Intramuscular   Manufacturer: Moderna   Lot: [id number]   [id number]

## 2020-01-08 DIAGNOSIS — M47817 Spondylosis without myelopathy or radiculopathy, lumbosacral region: Secondary | ICD-10-CM | POA: Diagnosis not present

## 2020-01-08 DIAGNOSIS — G5601 Carpal tunnel syndrome, right upper limb: Secondary | ICD-10-CM | POA: Diagnosis not present

## 2020-01-08 DIAGNOSIS — M4807 Spinal stenosis, lumbosacral region: Secondary | ICD-10-CM | POA: Diagnosis not present

## 2020-01-08 DIAGNOSIS — G894 Chronic pain syndrome: Secondary | ICD-10-CM | POA: Diagnosis not present

## 2020-01-15 ENCOUNTER — Other Ambulatory Visit: Payer: Self-pay | Admitting: Medical

## 2020-01-27 DIAGNOSIS — G8929 Other chronic pain: Secondary | ICD-10-CM | POA: Diagnosis not present

## 2020-01-27 DIAGNOSIS — Z1389 Encounter for screening for other disorder: Secondary | ICD-10-CM | POA: Diagnosis not present

## 2020-01-27 DIAGNOSIS — F419 Anxiety disorder, unspecified: Secondary | ICD-10-CM | POA: Diagnosis not present

## 2020-01-27 DIAGNOSIS — E7800 Pure hypercholesterolemia, unspecified: Secondary | ICD-10-CM | POA: Diagnosis not present

## 2020-01-27 DIAGNOSIS — Z0000 Encounter for general adult medical examination without abnormal findings: Secondary | ICD-10-CM | POA: Diagnosis not present

## 2020-01-27 DIAGNOSIS — M109 Gout, unspecified: Secondary | ICD-10-CM | POA: Diagnosis not present

## 2020-01-27 DIAGNOSIS — M129 Arthropathy, unspecified: Secondary | ICD-10-CM | POA: Diagnosis not present

## 2020-01-27 DIAGNOSIS — F325 Major depressive disorder, single episode, in full remission: Secondary | ICD-10-CM | POA: Diagnosis not present

## 2020-01-27 DIAGNOSIS — E559 Vitamin D deficiency, unspecified: Secondary | ICD-10-CM | POA: Diagnosis not present

## 2020-01-27 DIAGNOSIS — I2510 Atherosclerotic heart disease of native coronary artery without angina pectoris: Secondary | ICD-10-CM | POA: Diagnosis not present

## 2020-01-27 DIAGNOSIS — G4700 Insomnia, unspecified: Secondary | ICD-10-CM | POA: Diagnosis not present

## 2020-01-27 DIAGNOSIS — I119 Hypertensive heart disease without heart failure: Secondary | ICD-10-CM | POA: Diagnosis not present

## 2020-01-28 DIAGNOSIS — I119 Hypertensive heart disease without heart failure: Secondary | ICD-10-CM | POA: Diagnosis not present

## 2020-01-28 DIAGNOSIS — E7800 Pure hypercholesterolemia, unspecified: Secondary | ICD-10-CM | POA: Diagnosis not present

## 2020-01-28 DIAGNOSIS — Z125 Encounter for screening for malignant neoplasm of prostate: Secondary | ICD-10-CM | POA: Diagnosis not present

## 2020-01-28 DIAGNOSIS — E559 Vitamin D deficiency, unspecified: Secondary | ICD-10-CM | POA: Diagnosis not present

## 2020-02-01 ENCOUNTER — Ambulatory Visit: Payer: PPO | Attending: Internal Medicine

## 2020-02-01 DIAGNOSIS — Z23 Encounter for immunization: Secondary | ICD-10-CM

## 2020-02-01 NOTE — Progress Notes (Signed)
   Covid-19 Vaccination Clinic  Name:  Jeremiah [name]    MRN: [id number] DOB: 11-Apr-1951  02/01/2020  Mr. Jeremiah [name] was observed post Covid-19 immunization for 15 minutes without incident. He was provided with Vaccine Information Sheet and instruction to access the V-Safe system.   Mr. Jeremiah [name] was instructed to call 911 with any severe reactions post vaccine: [name] Difficulty breathing  . Swelling of face and throat  . A fast heartbeat  . A bad rash all over body  . Dizziness and weakness   Immunizations Administered    Name Date Dose VIS Date Route   Moderna COVID-19 Vaccine 02/01/2020  9:55 AM 0.5 mL 10/22/2019 Intramuscular   Manufacturer: Moderna   Lot: [id number]   [id number]

## 2020-02-10 DIAGNOSIS — M47817 Spondylosis without myelopathy or radiculopathy, lumbosacral region: Secondary | ICD-10-CM | POA: Diagnosis not present

## 2020-02-10 DIAGNOSIS — G5601 Carpal tunnel syndrome, right upper limb: Secondary | ICD-10-CM | POA: Diagnosis not present

## 2020-02-10 DIAGNOSIS — M4807 Spinal stenosis, lumbosacral region: Secondary | ICD-10-CM | POA: Diagnosis not present

## 2020-02-10 DIAGNOSIS — G894 Chronic pain syndrome: Secondary | ICD-10-CM | POA: Diagnosis not present

## 2020-02-14 ENCOUNTER — Telehealth: Payer: Self-pay | Admitting: Cardiovascular Disease

## 2020-02-14 NOTE — Telephone Encounter (Signed)
He had a 5 vessel bypass in 1998 by Dr Cyndia Bent

## 2020-02-14 NOTE — Telephone Encounter (Signed)
Patient stated he wants to get a medical ID bracelet and wanted to put that he had a CABG on it, but wanted to know the exact date and how many bypasses did he have. Informed patient that I did not have a date for him, but a year (1998) . Informed patient that I am not sure of how many bypasses he has. Will forward to Dr. Johnsie Cancel for advisement.   "He has a history of CAD with CABG by Dr. Cyndia Bent in 1998.  He had a LIMA to LAD, SVG D1, SVG OM1 and OM2, SVG PDA/PLA and distal circ."

## 2020-02-14 NOTE — Telephone Encounter (Signed)
Called patient back with Dr. Nishan's message. Patient verbalized understanding. 

## 2020-02-14 NOTE — Telephone Encounter (Signed)
Patient calling requesting help filling out his medical history and diagnosis. He would also like to know how many bypasses he's had and the dates. Please advise.

## 2020-02-24 DIAGNOSIS — I10 Essential (primary) hypertension: Secondary | ICD-10-CM | POA: Diagnosis not present

## 2020-02-24 DIAGNOSIS — I119 Hypertensive heart disease without heart failure: Secondary | ICD-10-CM | POA: Diagnosis not present

## 2020-02-24 DIAGNOSIS — I2510 Atherosclerotic heart disease of native coronary artery without angina pectoris: Secondary | ICD-10-CM | POA: Diagnosis not present

## 2020-02-24 DIAGNOSIS — F325 Major depressive disorder, single episode, in full remission: Secondary | ICD-10-CM | POA: Diagnosis not present

## 2020-02-24 DIAGNOSIS — G4700 Insomnia, unspecified: Secondary | ICD-10-CM | POA: Diagnosis not present

## 2020-02-24 DIAGNOSIS — E7800 Pure hypercholesterolemia, unspecified: Secondary | ICD-10-CM | POA: Diagnosis not present

## 2020-02-24 DIAGNOSIS — F329 Major depressive disorder, single episode, unspecified: Secondary | ICD-10-CM | POA: Diagnosis not present

## 2020-03-09 DIAGNOSIS — M4807 Spinal stenosis, lumbosacral region: Secondary | ICD-10-CM | POA: Diagnosis not present

## 2020-03-09 DIAGNOSIS — G894 Chronic pain syndrome: Secondary | ICD-10-CM | POA: Diagnosis not present

## 2020-03-09 DIAGNOSIS — M47817 Spondylosis without myelopathy or radiculopathy, lumbosacral region: Secondary | ICD-10-CM | POA: Diagnosis not present

## 2020-03-09 DIAGNOSIS — G5601 Carpal tunnel syndrome, right upper limb: Secondary | ICD-10-CM | POA: Diagnosis not present

## 2020-03-10 DIAGNOSIS — M546 Pain in thoracic spine: Secondary | ICD-10-CM | POA: Diagnosis not present

## 2020-03-10 DIAGNOSIS — M545 Low back pain: Secondary | ICD-10-CM | POA: Diagnosis not present

## 2020-03-10 DIAGNOSIS — M549 Dorsalgia, unspecified: Secondary | ICD-10-CM | POA: Diagnosis not present

## 2020-03-10 DIAGNOSIS — M5134 Other intervertebral disc degeneration, thoracic region: Secondary | ICD-10-CM | POA: Diagnosis not present

## 2020-03-10 DIAGNOSIS — M5136 Other intervertebral disc degeneration, lumbar region: Secondary | ICD-10-CM | POA: Diagnosis not present

## 2020-03-10 DIAGNOSIS — Z9181 History of falling: Secondary | ICD-10-CM | POA: Diagnosis not present

## 2020-03-10 DIAGNOSIS — S299XXA Unspecified injury of thorax, initial encounter: Secondary | ICD-10-CM | POA: Diagnosis not present
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
## 2020-03-17 ENCOUNTER — Telehealth (INDEPENDENT_AMBULATORY_CARE_PROVIDER_SITE_OTHER): Payer: PPO | Admitting: Cardiovascular Disease

## 2020-03-17 ENCOUNTER — Other Ambulatory Visit: Payer: Self-pay

## 2020-03-17 VITALS — BP 117/71 | HR 56 | Ht 67.0 in | Wt 242.0 lb

## 2020-03-17 DIAGNOSIS — Z951 Presence of aortocoronary bypass graft: Secondary | ICD-10-CM

## 2020-03-17 NOTE — Patient Instructions (Signed)

## 2020-03-31 DIAGNOSIS — G4700 Insomnia, unspecified: Secondary | ICD-10-CM | POA: Diagnosis not present

## 2020-04-07 DIAGNOSIS — M4807 Spinal stenosis, lumbosacral region: Secondary | ICD-10-CM | POA: Diagnosis not present

## 2020-04-07 DIAGNOSIS — G5601 Carpal tunnel syndrome, right upper limb: Secondary | ICD-10-CM | POA: Diagnosis not present

## 2020-04-07 DIAGNOSIS — M47817 Spondylosis without myelopathy or radiculopathy, lumbosacral region: Secondary | ICD-10-CM | POA: Diagnosis not present

## 2020-04-07 DIAGNOSIS — G894 Chronic pain syndrome: Secondary | ICD-10-CM | POA: Diagnosis not present

## 2020-04-20 DIAGNOSIS — R52 Pain, unspecified: Secondary | ICD-10-CM | POA: Diagnosis not present

## 2020-04-20 DIAGNOSIS — M545 Low back pain: Secondary | ICD-10-CM | POA: Diagnosis not present

## 2020-04-20 DIAGNOSIS — S3993XA Unspecified injury of pelvis, initial encounter: Secondary | ICD-10-CM | POA: Diagnosis not present

## 2020-04-20 DIAGNOSIS — M47816 Spondylosis without myelopathy or radiculopathy, lumbar region: Secondary | ICD-10-CM | POA: Diagnosis not present

## 2020-05-05 DIAGNOSIS — M4807 Spinal stenosis, lumbosacral region: Secondary | ICD-10-CM | POA: Diagnosis not present

## 2020-05-05 DIAGNOSIS — M47817 Spondylosis without myelopathy or radiculopathy, lumbosacral region: Secondary | ICD-10-CM | POA: Diagnosis not present

## 2020-05-05 DIAGNOSIS — G5601 Carpal tunnel syndrome, right upper limb: Secondary | ICD-10-CM | POA: Diagnosis not present

## 2020-05-05 DIAGNOSIS — G894 Chronic pain syndrome: Secondary | ICD-10-CM | POA: Diagnosis not present

## 2020-06-02 DIAGNOSIS — G894 Chronic pain syndrome: Secondary | ICD-10-CM | POA: Diagnosis not present

## 2020-06-02 DIAGNOSIS — G5601 Carpal tunnel syndrome, right upper limb: Secondary | ICD-10-CM | POA: Diagnosis not present

## 2020-06-02 DIAGNOSIS — M47817 Spondylosis without myelopathy or radiculopathy, lumbosacral region: Secondary | ICD-10-CM | POA: Diagnosis not present

## 2020-06-02 DIAGNOSIS — M4807 Spinal stenosis, lumbosacral region: Secondary | ICD-10-CM | POA: Diagnosis not present

## 2020-06-18 DIAGNOSIS — F329 Major depressive disorder, single episode, unspecified: Secondary | ICD-10-CM | POA: Diagnosis not present

## 2020-06-18 DIAGNOSIS — E7800 Pure hypercholesterolemia, unspecified: Secondary | ICD-10-CM | POA: Diagnosis not present

## 2020-06-18 DIAGNOSIS — G4700 Insomnia, unspecified: Secondary | ICD-10-CM | POA: Diagnosis not present

## 2020-06-18 DIAGNOSIS — I10 Essential (primary) hypertension: Secondary | ICD-10-CM | POA: Diagnosis not present

## 2020-06-18 DIAGNOSIS — F325 Major depressive disorder, single episode, in full remission: Secondary | ICD-10-CM | POA: Diagnosis not present

## 2020-06-18 DIAGNOSIS — I2510 Atherosclerotic heart disease of native coronary artery without angina pectoris: Secondary | ICD-10-CM | POA: Diagnosis not present

## 2020-06-18 DIAGNOSIS — I119 Hypertensive heart disease without heart failure: Secondary | ICD-10-CM | POA: Diagnosis not present

## 2020-06-22 DIAGNOSIS — R69 Illness, unspecified: Secondary | ICD-10-CM | POA: Diagnosis not present

## 2020-06-22 DIAGNOSIS — W19XXXA Unspecified fall, initial encounter: Secondary | ICD-10-CM | POA: Diagnosis not present

## 2020-06-22 DIAGNOSIS — R5381 Other malaise: Secondary | ICD-10-CM | POA: Diagnosis not present

## 2020-07-17 DIAGNOSIS — I10 Essential (primary) hypertension: Secondary | ICD-10-CM | POA: Diagnosis not present

## 2020-07-17 DIAGNOSIS — I2510 Atherosclerotic heart disease of native coronary artery without angina pectoris: Secondary | ICD-10-CM | POA: Diagnosis not present

## 2020-07-17 DIAGNOSIS — G4700 Insomnia, unspecified: Secondary | ICD-10-CM | POA: Diagnosis not present

## 2020-07-17 DIAGNOSIS — I119 Hypertensive heart disease without heart failure: Secondary | ICD-10-CM | POA: Diagnosis not present

## 2020-07-17 DIAGNOSIS — F325 Major depressive disorder, single episode, in full remission: Secondary | ICD-10-CM | POA: Diagnosis not present

## 2020-07-17 DIAGNOSIS — E7800 Pure hypercholesterolemia, unspecified: Secondary | ICD-10-CM | POA: Diagnosis not present

## 2020-07-17 DIAGNOSIS — F329 Major depressive disorder, single episode, unspecified: Secondary | ICD-10-CM | POA: Diagnosis not present

## 2020-07-25 DIAGNOSIS — R52 Pain, unspecified: Secondary | ICD-10-CM | POA: Diagnosis not present

## 2020-07-25 DIAGNOSIS — M549 Dorsalgia, unspecified: Secondary | ICD-10-CM | POA: Diagnosis not present

## 2020-07-25 DIAGNOSIS — I10 Essential (primary) hypertension: Secondary | ICD-10-CM | POA: Diagnosis not present

## 2020-07-25 DIAGNOSIS — Z951 Presence of aortocoronary bypass graft: Secondary | ICD-10-CM | POA: Diagnosis not present

## 2020-07-25 DIAGNOSIS — W19XXXA Unspecified fall, initial encounter: Secondary | ICD-10-CM | POA: Diagnosis not present

## 2020-07-25 DIAGNOSIS — M25561 Pain in right knee: Secondary | ICD-10-CM | POA: Diagnosis not present

## 2020-07-25 DIAGNOSIS — S8001XA Contusion of right knee, initial encounter: Secondary | ICD-10-CM | POA: Diagnosis not present

## 2020-07-25 DIAGNOSIS — W010XXA Fall on same level from slipping, tripping and stumbling without subsequent striking against object, initial encounter: Secondary | ICD-10-CM | POA: Diagnosis not present

## 2020-07-25 DIAGNOSIS — G8929 Other chronic pain: Secondary | ICD-10-CM | POA: Diagnosis not present

## 2020-07-25 DIAGNOSIS — Z96653 Presence of artificial knee joint, bilateral: Secondary | ICD-10-CM | POA: Diagnosis not present

## 2020-07-25 DIAGNOSIS — S8002XA Contusion of left knee, initial encounter: Secondary | ICD-10-CM | POA: Diagnosis not present

## 2020-07-25 DIAGNOSIS — S0990XA Unspecified injury of head, initial encounter: Secondary | ICD-10-CM | POA: Diagnosis not present

## 2020-07-25 DIAGNOSIS — M25511 Pain in right shoulder: Secondary | ICD-10-CM | POA: Diagnosis not present

## 2020-07-25 DIAGNOSIS — M25562 Pain in left knee: Secondary | ICD-10-CM | POA: Diagnosis not present

## 2020-07-25 DIAGNOSIS — M25512 Pain in left shoulder: Secondary | ICD-10-CM | POA: Diagnosis not present

## 2020-07-29 DIAGNOSIS — M25562 Pain in left knee: Secondary | ICD-10-CM | POA: Diagnosis not present

## 2020-07-29 DIAGNOSIS — M545 Low back pain: Secondary | ICD-10-CM | POA: Diagnosis not present

## 2020-07-29 DIAGNOSIS — R079 Chest pain, unspecified: Secondary | ICD-10-CM | POA: Diagnosis not present

## 2020-07-29 DIAGNOSIS — Z96652 Presence of left artificial knee joint: Secondary | ICD-10-CM | POA: Diagnosis not present

## 2020-07-29 DIAGNOSIS — M47812 Spondylosis without myelopathy or radiculopathy, cervical region: Secondary | ICD-10-CM | POA: Diagnosis not present

## 2020-07-29 DIAGNOSIS — M25572 Pain in left ankle and joints of left foot: Secondary | ICD-10-CM | POA: Diagnosis not present

## 2020-07-29 DIAGNOSIS — W08XXXA Fall from other furniture, initial encounter: Secondary | ICD-10-CM | POA: Diagnosis not present

## 2020-07-29 DIAGNOSIS — M7918 Myalgia, other site: Secondary | ICD-10-CM | POA: Diagnosis not present

## 2020-07-29 DIAGNOSIS — M7989 Other specified soft tissue disorders: Secondary | ICD-10-CM | POA: Diagnosis not present

## 2020-07-29 DIAGNOSIS — S0990XA Unspecified injury of head, initial encounter: Secondary | ICD-10-CM | POA: Diagnosis not present

## 2020-07-29 DIAGNOSIS — I10 Essential (primary) hypertension: Secondary | ICD-10-CM | POA: Diagnosis not present

## 2020-07-29 DIAGNOSIS — M79671 Pain in right foot: Secondary | ICD-10-CM | POA: Diagnosis not present

## 2020-07-29 DIAGNOSIS — S199XXA Unspecified injury of neck, initial encounter: Secondary | ICD-10-CM | POA: Diagnosis not present

## 2020-07-29 DIAGNOSIS — M25512 Pain in left shoulder: Secondary | ICD-10-CM | POA: Diagnosis not present

## 2020-07-30 DIAGNOSIS — M25512 Pain in left shoulder: Secondary | ICD-10-CM | POA: Diagnosis not present

## 2020-07-30 DIAGNOSIS — R079 Chest pain, unspecified: Secondary | ICD-10-CM | POA: Diagnosis not present

## 2020-07-30 DIAGNOSIS — M79671 Pain in right foot: Secondary | ICD-10-CM | POA: Diagnosis not present

## 2020-07-30 DIAGNOSIS — S199XXA Unspecified injury of neck, initial encounter: Secondary | ICD-10-CM | POA: Diagnosis not present

## 2020-07-30 DIAGNOSIS — M25562 Pain in left knee: Secondary | ICD-10-CM | POA: Diagnosis not present

## 2020-07-30 DIAGNOSIS — M545 Low back pain: Secondary | ICD-10-CM | POA: Diagnosis not present

## 2020-07-30 DIAGNOSIS — M7989 Other specified soft tissue disorders: Secondary | ICD-10-CM | POA: Diagnosis not present

## 2020-07-30 DIAGNOSIS — S0990XA Unspecified injury of head, initial encounter: Secondary | ICD-10-CM | POA: Diagnosis not present

## 2020-07-30 DIAGNOSIS — M25572 Pain in left ankle and joints of left foot: Secondary | ICD-10-CM | POA: Diagnosis not present

## 2020-08-03 DIAGNOSIS — M4807 Spinal stenosis, lumbosacral region: Secondary | ICD-10-CM | POA: Diagnosis not present

## 2020-08-03 DIAGNOSIS — G894 Chronic pain syndrome: Secondary | ICD-10-CM | POA: Diagnosis not present

## 2020-08-03 DIAGNOSIS — M47817 Spondylosis without myelopathy or radiculopathy, lumbosacral region: Secondary | ICD-10-CM | POA: Diagnosis not present

## 2020-08-03 DIAGNOSIS — Z79891 Long term (current) use of opiate analgesic: Secondary | ICD-10-CM | POA: Diagnosis not present

## 2020-08-03 DIAGNOSIS — G5601 Carpal tunnel syndrome, right upper limb: Secondary | ICD-10-CM | POA: Diagnosis not present

## 2020-08-13 DIAGNOSIS — M2578 Osteophyte, vertebrae: Secondary | ICD-10-CM | POA: Diagnosis not present

## 2020-08-13 DIAGNOSIS — S0302XA Dislocation of jaw, left side, initial encounter: Secondary | ICD-10-CM | POA: Diagnosis not present

## 2020-08-13 DIAGNOSIS — M549 Dorsalgia, unspecified: Secondary | ICD-10-CM | POA: Diagnosis not present

## 2020-08-13 DIAGNOSIS — W0110XA Fall on same level from slipping, tripping and stumbling with subsequent striking against unspecified object, initial encounter: Secondary | ICD-10-CM | POA: Diagnosis not present

## 2020-08-13 DIAGNOSIS — R42 Dizziness and giddiness: Secondary | ICD-10-CM | POA: Diagnosis not present

## 2020-08-13 DIAGNOSIS — R220 Localized swelling, mass and lump, head: Secondary | ICD-10-CM | POA: Diagnosis not present

## 2020-08-13 DIAGNOSIS — F10929 Alcohol use, unspecified with intoxication, unspecified: Secondary | ICD-10-CM | POA: Diagnosis not present

## 2020-08-13 DIAGNOSIS — S13120A Subluxation of C1/C2 cervical vertebrae, initial encounter: Secondary | ICD-10-CM | POA: Diagnosis not present

## 2020-08-13 DIAGNOSIS — S199XXA Unspecified injury of neck, initial encounter: Secondary | ICD-10-CM | POA: Diagnosis not present

## 2020-08-13 DIAGNOSIS — W19XXXA Unspecified fall, initial encounter: Secondary | ICD-10-CM | POA: Diagnosis not present

## 2020-08-13 DIAGNOSIS — M4802 Spinal stenosis, cervical region: Secondary | ICD-10-CM | POA: Diagnosis not present

## 2020-08-13 DIAGNOSIS — Z981 Arthrodesis status: Secondary | ICD-10-CM | POA: Diagnosis not present

## 2020-08-13 DIAGNOSIS — G8929 Other chronic pain: Secondary | ICD-10-CM | POA: Diagnosis not present

## 2020-08-13 DIAGNOSIS — G9389 Other specified disorders of brain: Secondary | ICD-10-CM | POA: Diagnosis not present

## 2020-08-13 DIAGNOSIS — S0990XA Unspecified injury of head, initial encounter: Secondary | ICD-10-CM | POA: Diagnosis not present

## 2020-08-13 DIAGNOSIS — R2681 Unsteadiness on feet: Secondary | ICD-10-CM | POA: Diagnosis not present

## 2020-08-31 DIAGNOSIS — M47817 Spondylosis without myelopathy or radiculopathy, lumbosacral region: Secondary | ICD-10-CM | POA: Diagnosis not present

## 2020-08-31 DIAGNOSIS — G894 Chronic pain syndrome: Secondary | ICD-10-CM | POA: Diagnosis not present

## 2020-08-31 DIAGNOSIS — G5601 Carpal tunnel syndrome, right upper limb: Secondary | ICD-10-CM | POA: Diagnosis not present

## 2020-08-31 DIAGNOSIS — M4807 Spinal stenosis, lumbosacral region: Secondary | ICD-10-CM | POA: Diagnosis not present

## 2020-09-12 ENCOUNTER — Other Ambulatory Visit: Payer: Self-pay | Admitting: Medical

## 2020-09-16 ENCOUNTER — Ambulatory Visit: Payer: PPO | Admitting: Cardiovascular Disease

## 2020-09-21 DIAGNOSIS — G894 Chronic pain syndrome: Secondary | ICD-10-CM | POA: Diagnosis not present

## 2020-09-21 DIAGNOSIS — M4807 Spinal stenosis, lumbosacral region: Secondary | ICD-10-CM | POA: Diagnosis not present

## 2020-09-21 DIAGNOSIS — M47817 Spondylosis without myelopathy or radiculopathy, lumbosacral region: Secondary | ICD-10-CM | POA: Diagnosis not present

## 2020-09-21 DIAGNOSIS — G5601 Carpal tunnel syndrome, right upper limb: Secondary | ICD-10-CM | POA: Diagnosis not present

## 2020-09-24 DIAGNOSIS — G4700 Insomnia, unspecified: Secondary | ICD-10-CM | POA: Diagnosis not present

## 2020-09-24 DIAGNOSIS — F325 Major depressive disorder, single episode, in full remission: Secondary | ICD-10-CM | POA: Diagnosis not present

## 2020-09-24 DIAGNOSIS — M109 Gout, unspecified: Secondary | ICD-10-CM | POA: Diagnosis not present

## 2020-09-24 DIAGNOSIS — M129 Arthropathy, unspecified: Secondary | ICD-10-CM | POA: Diagnosis not present

## 2020-09-24 DIAGNOSIS — E7800 Pure hypercholesterolemia, unspecified: Secondary | ICD-10-CM | POA: Diagnosis not present

## 2020-09-24 DIAGNOSIS — I10 Essential (primary) hypertension: Secondary | ICD-10-CM | POA: Diagnosis not present

## 2020-09-24 DIAGNOSIS — G8929 Other chronic pain: Secondary | ICD-10-CM | POA: Diagnosis not present

## 2020-09-24 DIAGNOSIS — R609 Edema, unspecified: Secondary | ICD-10-CM | POA: Diagnosis not present

## 2020-09-24 DIAGNOSIS — I2510 Atherosclerotic heart disease of native coronary artery without angina pectoris: Secondary | ICD-10-CM | POA: Diagnosis not present

## 2020-09-24 DIAGNOSIS — Z23 Encounter for immunization: Secondary | ICD-10-CM | POA: Diagnosis not present
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
## 2020-10-02 ENCOUNTER — Encounter (INDEPENDENT_AMBULATORY_CARE_PROVIDER_SITE_OTHER): Payer: Self-pay

## 2020-10-02 ENCOUNTER — Other Ambulatory Visit: Payer: Self-pay

## 2020-10-02 ENCOUNTER — Telehealth (INDEPENDENT_AMBULATORY_CARE_PROVIDER_SITE_OTHER): Payer: PPO | Admitting: Cardiovascular Disease

## 2020-10-02 VITALS — BP 140/80 | HR 62 | Ht 67.0 in | Wt 255.0 lb

## 2020-10-02 DIAGNOSIS — Z951 Presence of aortocoronary bypass graft: Secondary | ICD-10-CM

## 2020-10-02 NOTE — Patient Instructions (Signed)

## 2020-10-13 DIAGNOSIS — I129 Hypertensive chronic kidney disease with stage 1 through stage 4 chronic kidney disease, or unspecified chronic kidney disease: Secondary | ICD-10-CM | POA: Diagnosis not present

## 2020-10-13 DIAGNOSIS — I452 Bifascicular block: Secondary | ICD-10-CM | POA: Diagnosis not present

## 2020-10-13 DIAGNOSIS — Z79899 Other long term (current) drug therapy: Secondary | ICD-10-CM | POA: Diagnosis not present

## 2020-10-13 DIAGNOSIS — Z791 Long term (current) use of non-steroidal anti-inflammatories (NSAID): Secondary | ICD-10-CM | POA: Diagnosis not present

## 2020-10-13 DIAGNOSIS — T796XXA Traumatic ischemia of muscle, initial encounter: Secondary | ICD-10-CM | POA: Diagnosis not present

## 2020-10-13 DIAGNOSIS — S90811A Abrasion, right foot, initial encounter: Secondary | ICD-10-CM | POA: Diagnosis not present

## 2020-10-13 DIAGNOSIS — R410 Disorientation, unspecified: Secondary | ICD-10-CM | POA: Diagnosis not present

## 2020-10-13 DIAGNOSIS — M25569 Pain in unspecified knee: Secondary | ICD-10-CM | POA: Diagnosis not present

## 2020-10-13 DIAGNOSIS — I517 Cardiomegaly: Secondary | ICD-10-CM | POA: Diagnosis not present

## 2020-10-13 DIAGNOSIS — I2510 Atherosclerotic heart disease of native coronary artery without angina pectoris: Secondary | ICD-10-CM | POA: Diagnosis not present

## 2020-10-13 DIAGNOSIS — R778 Other specified abnormalities of plasma proteins: Secondary | ICD-10-CM | POA: Diagnosis not present

## 2020-10-13 DIAGNOSIS — M25519 Pain in unspecified shoulder: Secondary | ICD-10-CM | POA: Diagnosis not present

## 2020-10-13 DIAGNOSIS — M542 Cervicalgia: Secondary | ICD-10-CM | POA: Diagnosis not present

## 2020-10-13 DIAGNOSIS — I4510 Unspecified right bundle-branch block: Secondary | ICD-10-CM | POA: Diagnosis not present

## 2020-10-13 DIAGNOSIS — S139XXA Sprain of joints and ligaments of unspecified parts of neck, initial encounter: Secondary | ICD-10-CM | POA: Diagnosis not present

## 2020-10-13 DIAGNOSIS — M7989 Other specified soft tissue disorders: Secondary | ICD-10-CM | POA: Diagnosis not present

## 2020-10-13 DIAGNOSIS — Z951 Presence of aortocoronary bypass graft: Secondary | ICD-10-CM | POA: Diagnosis not present

## 2020-10-13 DIAGNOSIS — W19XXXA Unspecified fall, initial encounter: Secondary | ICD-10-CM | POA: Diagnosis not present

## 2020-10-13 DIAGNOSIS — S0081XA Abrasion of other part of head, initial encounter: Secondary | ICD-10-CM | POA: Diagnosis not present

## 2020-10-13 DIAGNOSIS — G9341 Metabolic encephalopathy: Secondary | ICD-10-CM | POA: Diagnosis not present

## 2020-10-13 DIAGNOSIS — W1830XA Fall on same level, unspecified, initial encounter: Secondary | ICD-10-CM | POA: Diagnosis not present

## 2020-10-13 DIAGNOSIS — N189 Chronic kidney disease, unspecified: Secondary | ICD-10-CM | POA: Diagnosis not present

## 2020-10-13 DIAGNOSIS — E785 Hyperlipidemia, unspecified: Secondary | ICD-10-CM | POA: Diagnosis not present

## 2020-10-13 DIAGNOSIS — S0990XA Unspecified injury of head, initial encounter: Secondary | ICD-10-CM | POA: Diagnosis not present

## 2020-10-13 DIAGNOSIS — R7989 Other specified abnormal findings of blood chemistry: Secondary | ICD-10-CM | POA: Diagnosis not present

## 2020-10-13 DIAGNOSIS — J811 Chronic pulmonary edema: Secondary | ICD-10-CM | POA: Diagnosis not present

## 2020-10-13 DIAGNOSIS — Z20822 Contact with and (suspected) exposure to covid-19: Secondary | ICD-10-CM | POA: Diagnosis not present

## 2020-10-13 DIAGNOSIS — Z043 Encounter for examination and observation following other accident: Secondary | ICD-10-CM | POA: Diagnosis not present

## 2020-10-13 DIAGNOSIS — K219 Gastro-esophageal reflux disease without esophagitis: Secondary | ICD-10-CM | POA: Insufficient documentation

## 2020-10-13 DIAGNOSIS — N179 Acute kidney failure, unspecified: Secondary | ICD-10-CM | POA: Diagnosis not present

## 2020-10-13 DIAGNOSIS — R748 Abnormal levels of other serum enzymes: Secondary | ICD-10-CM | POA: Diagnosis not present

## 2020-10-13 DIAGNOSIS — R2681 Unsteadiness on feet: Secondary | ICD-10-CM | POA: Diagnosis not present

## 2020-10-13 DIAGNOSIS — E869 Volume depletion, unspecified: Secondary | ICD-10-CM | POA: Diagnosis not present

## 2020-10-13 DIAGNOSIS — R52 Pain, unspecified: Secondary | ICD-10-CM | POA: Diagnosis not present

## 2020-10-13 DIAGNOSIS — R296 Repeated falls: Secondary | ICD-10-CM | POA: Diagnosis not present

## 2020-10-13 DIAGNOSIS — Z96652 Presence of left artificial knee joint: Secondary | ICD-10-CM | POA: Diagnosis not present

## 2020-10-13 DIAGNOSIS — M6282 Rhabdomyolysis: Secondary | ICD-10-CM | POA: Diagnosis not present

## 2020-10-13 DIAGNOSIS — Z9884 Bariatric surgery status: Secondary | ICD-10-CM | POA: Diagnosis not present

## 2020-10-14 DIAGNOSIS — M6282 Rhabdomyolysis: Secondary | ICD-10-CM | POA: Diagnosis not present

## 2020-10-28 DIAGNOSIS — G5601 Carpal tunnel syndrome, right upper limb: Secondary | ICD-10-CM | POA: Diagnosis not present

## 2020-10-28 DIAGNOSIS — G894 Chronic pain syndrome: Secondary | ICD-10-CM | POA: Diagnosis not present

## 2020-10-28 DIAGNOSIS — M47817 Spondylosis without myelopathy or radiculopathy, lumbosacral region: Secondary | ICD-10-CM | POA: Diagnosis not present

## 2020-10-28 DIAGNOSIS — M4807 Spinal stenosis, lumbosacral region: Secondary | ICD-10-CM | POA: Diagnosis not present

## 2020-11-05 DIAGNOSIS — I119 Hypertensive heart disease without heart failure: Secondary | ICD-10-CM | POA: Diagnosis not present

## 2020-11-05 DIAGNOSIS — I2510 Atherosclerotic heart disease of native coronary artery without angina pectoris: Secondary | ICD-10-CM | POA: Diagnosis not present

## 2020-11-05 DIAGNOSIS — M129 Arthropathy, unspecified: Secondary | ICD-10-CM | POA: Diagnosis not present

## 2020-11-05 DIAGNOSIS — F325 Major depressive disorder, single episode, in full remission: Secondary | ICD-10-CM | POA: Diagnosis not present

## 2020-11-05 DIAGNOSIS — Z0289 Encounter for other administrative examinations: Secondary | ICD-10-CM | POA: Diagnosis not present

## 2020-11-26 DIAGNOSIS — G894 Chronic pain syndrome: Secondary | ICD-10-CM | POA: Diagnosis not present

## 2020-11-26 DIAGNOSIS — M47817 Spondylosis without myelopathy or radiculopathy, lumbosacral region: Secondary | ICD-10-CM | POA: Diagnosis not present

## 2020-11-26 DIAGNOSIS — G5601 Carpal tunnel syndrome, right upper limb: Secondary | ICD-10-CM | POA: Diagnosis not present

## 2020-11-26 DIAGNOSIS — M4807 Spinal stenosis, lumbosacral region: Secondary | ICD-10-CM | POA: Diagnosis not present

## 2020-12-04 DIAGNOSIS — G9341 Metabolic encephalopathy: Secondary | ICD-10-CM | POA: Diagnosis not present

## 2020-12-04 DIAGNOSIS — I4510 Unspecified right bundle-branch block: Secondary | ICD-10-CM | POA: Diagnosis not present

## 2020-12-04 DIAGNOSIS — I10 Essential (primary) hypertension: Secondary | ICD-10-CM | POA: Diagnosis not present

## 2020-12-04 DIAGNOSIS — E876 Hypokalemia: Secondary | ICD-10-CM | POA: Diagnosis not present

## 2020-12-04 DIAGNOSIS — R4182 Altered mental status, unspecified: Secondary | ICD-10-CM | POA: Diagnosis not present

## 2020-12-04 DIAGNOSIS — R410 Disorientation, unspecified: Secondary | ICD-10-CM | POA: Diagnosis not present

## 2020-12-04 DIAGNOSIS — Z79899 Other long term (current) drug therapy: Secondary | ICD-10-CM | POA: Diagnosis not present

## 2020-12-04 DIAGNOSIS — R9431 Abnormal electrocardiogram [ECG] [EKG]: Secondary | ICD-10-CM | POA: Diagnosis not present

## 2020-12-04 DIAGNOSIS — R52 Pain, unspecified: Secondary | ICD-10-CM | POA: Diagnosis not present

## 2020-12-04 DIAGNOSIS — R0902 Hypoxemia: Secondary | ICD-10-CM | POA: Diagnosis not present

## 2020-12-04 DIAGNOSIS — Z20822 Contact with and (suspected) exposure to covid-19: Secondary | ICD-10-CM | POA: Diagnosis not present

## 2020-12-25 DIAGNOSIS — R059 Cough, unspecified: Secondary | ICD-10-CM | POA: Diagnosis not present

## 2020-12-28 DIAGNOSIS — M4807 Spinal stenosis, lumbosacral region: Secondary | ICD-10-CM | POA: Diagnosis not present

## 2020-12-28 DIAGNOSIS — M47817 Spondylosis without myelopathy or radiculopathy, lumbosacral region: Secondary | ICD-10-CM | POA: Diagnosis not present

## 2020-12-28 DIAGNOSIS — G894 Chronic pain syndrome: Secondary | ICD-10-CM | POA: Diagnosis not present

## 2020-12-28 DIAGNOSIS — G5601 Carpal tunnel syndrome, right upper limb: Secondary | ICD-10-CM | POA: Diagnosis not present

## 2021-01-25 DIAGNOSIS — D649 Anemia, unspecified: Secondary | ICD-10-CM | POA: Diagnosis not present

## 2021-01-25 DIAGNOSIS — Z6837 Body mass index (BMI) 37.0-37.9, adult: Secondary | ICD-10-CM | POA: Diagnosis not present

## 2021-01-25 DIAGNOSIS — E669 Obesity, unspecified: Secondary | ICD-10-CM | POA: Diagnosis not present

## 2021-01-25 DIAGNOSIS — R42 Dizziness and giddiness: Secondary | ICD-10-CM | POA: Diagnosis not present

## 2021-01-25 DIAGNOSIS — Z9181 History of falling: Secondary | ICD-10-CM | POA: Diagnosis not present

## 2021-01-25 DIAGNOSIS — Z96612 Presence of left artificial shoulder joint: Secondary | ICD-10-CM | POA: Diagnosis not present

## 2021-01-25 DIAGNOSIS — R000 Tachycardia, unspecified: Secondary | ICD-10-CM | POA: Diagnosis not present

## 2021-01-25 DIAGNOSIS — G8929 Other chronic pain: Secondary | ICD-10-CM | POA: Diagnosis not present

## 2021-01-25 DIAGNOSIS — I714 Abdominal aortic aneurysm, without rupture: Secondary | ICD-10-CM | POA: Diagnosis not present

## 2021-01-25 DIAGNOSIS — R262 Difficulty in walking, not elsewhere classified: Secondary | ICD-10-CM | POA: Diagnosis not present

## 2021-01-25 DIAGNOSIS — S4991XA Unspecified injury of right shoulder and upper arm, initial encounter: Secondary | ICD-10-CM | POA: Diagnosis not present

## 2021-01-25 DIAGNOSIS — M25512 Pain in left shoulder: Secondary | ICD-10-CM | POA: Diagnosis not present

## 2021-01-25 DIAGNOSIS — F329 Major depressive disorder, single episode, unspecified: Secondary | ICD-10-CM | POA: Diagnosis not present

## 2021-01-25 DIAGNOSIS — F419 Anxiety disorder, unspecified: Secondary | ICD-10-CM | POA: Diagnosis not present

## 2021-01-25 DIAGNOSIS — Z471 Aftercare following joint replacement surgery: Secondary | ICD-10-CM | POA: Diagnosis not present

## 2021-01-25 DIAGNOSIS — F4321 Adjustment disorder with depressed mood: Secondary | ICD-10-CM | POA: Diagnosis not present

## 2021-01-25 DIAGNOSIS — R410 Disorientation, unspecified: Secondary | ICD-10-CM | POA: Diagnosis not present

## 2021-01-25 DIAGNOSIS — S0003XA Contusion of scalp, initial encounter: Secondary | ICD-10-CM | POA: Diagnosis not present

## 2021-01-25 DIAGNOSIS — W19XXXA Unspecified fall, initial encounter: Secondary | ICD-10-CM | POA: Diagnosis not present

## 2021-01-25 DIAGNOSIS — M25519 Pain in unspecified shoulder: Secondary | ICD-10-CM | POA: Diagnosis not present

## 2021-01-25 DIAGNOSIS — M25511 Pain in right shoulder: Secondary | ICD-10-CM | POA: Diagnosis not present

## 2021-01-25 DIAGNOSIS — Z7409 Other reduced mobility: Secondary | ICD-10-CM | POA: Diagnosis not present

## 2021-01-25 DIAGNOSIS — F1010 Alcohol abuse, uncomplicated: Secondary | ICD-10-CM | POA: Diagnosis not present

## 2021-01-25 DIAGNOSIS — I10 Essential (primary) hypertension: Secondary | ICD-10-CM | POA: Diagnosis not present

## 2021-01-25 DIAGNOSIS — E7800 Pure hypercholesterolemia, unspecified: Secondary | ICD-10-CM | POA: Diagnosis not present

## 2021-01-25 DIAGNOSIS — R2689 Other abnormalities of gait and mobility: Secondary | ICD-10-CM | POA: Diagnosis not present

## 2021-01-25 DIAGNOSIS — Z951 Presence of aortocoronary bypass graft: Secondary | ICD-10-CM | POA: Diagnosis not present

## 2021-01-26 DIAGNOSIS — M25511 Pain in right shoulder: Secondary | ICD-10-CM | POA: Diagnosis not present

## 2021-01-26 DIAGNOSIS — S4991XA Unspecified injury of right shoulder and upper arm, initial encounter: Secondary | ICD-10-CM | POA: Diagnosis not present

## 2021-01-27 DIAGNOSIS — D72829 Elevated white blood cell count, unspecified: Secondary | ICD-10-CM | POA: Diagnosis not present

## 2021-01-27 DIAGNOSIS — I082 Rheumatic disorders of both aortic and tricuspid valves: Secondary | ICD-10-CM | POA: Diagnosis not present

## 2021-01-27 DIAGNOSIS — G9341 Metabolic encephalopathy: Secondary | ICD-10-CM | POA: Diagnosis not present

## 2021-01-27 DIAGNOSIS — R197 Diarrhea, unspecified: Secondary | ICD-10-CM | POA: Diagnosis not present

## 2021-01-27 DIAGNOSIS — R7989 Other specified abnormal findings of blood chemistry: Secondary | ICD-10-CM | POA: Diagnosis not present

## 2021-01-27 DIAGNOSIS — R911 Solitary pulmonary nodule: Secondary | ICD-10-CM | POA: Diagnosis not present

## 2021-01-27 DIAGNOSIS — Z96612 Presence of left artificial shoulder joint: Secondary | ICD-10-CM | POA: Diagnosis not present

## 2021-01-27 DIAGNOSIS — S0003XA Contusion of scalp, initial encounter: Secondary | ICD-10-CM | POA: Diagnosis not present

## 2021-01-27 DIAGNOSIS — Z20822 Contact with and (suspected) exposure to covid-19: Secondary | ICD-10-CM | POA: Diagnosis not present

## 2021-01-27 DIAGNOSIS — I2609 Other pulmonary embolism with acute cor pulmonale: Secondary | ICD-10-CM | POA: Diagnosis not present

## 2021-01-27 DIAGNOSIS — S9031XA Contusion of right foot, initial encounter: Secondary | ICD-10-CM | POA: Diagnosis not present

## 2021-01-27 DIAGNOSIS — S0083XA Contusion of other part of head, initial encounter: Secondary | ICD-10-CM | POA: Diagnosis not present

## 2021-01-27 DIAGNOSIS — K121 Other forms of stomatitis: Secondary | ICD-10-CM | POA: Diagnosis not present

## 2021-01-27 DIAGNOSIS — J90 Pleural effusion, not elsewhere classified: Secondary | ICD-10-CM | POA: Diagnosis not present

## 2021-01-27 DIAGNOSIS — N200 Calculus of kidney: Secondary | ICD-10-CM | POA: Diagnosis not present

## 2021-01-27 DIAGNOSIS — B9689 Other specified bacterial agents as the cause of diseases classified elsewhere: Secondary | ICD-10-CM | POA: Diagnosis not present

## 2021-01-27 DIAGNOSIS — E871 Hypo-osmolality and hyponatremia: Secondary | ICD-10-CM | POA: Diagnosis not present

## 2021-01-27 DIAGNOSIS — R059 Cough, unspecified: Secondary | ICD-10-CM | POA: Diagnosis not present

## 2021-01-27 DIAGNOSIS — J9811 Atelectasis: Secondary | ICD-10-CM | POA: Diagnosis not present

## 2021-01-27 DIAGNOSIS — E878 Other disorders of electrolyte and fluid balance, not elsewhere classified: Secondary | ICD-10-CM | POA: Diagnosis not present

## 2021-01-27 DIAGNOSIS — M898X1 Other specified disorders of bone, shoulder: Secondary | ICD-10-CM | POA: Diagnosis not present

## 2021-01-27 DIAGNOSIS — F419 Anxiety disorder, unspecified: Secondary | ICD-10-CM | POA: Diagnosis not present

## 2021-01-27 DIAGNOSIS — R770 Abnormality of albumin: Secondary | ICD-10-CM | POA: Diagnosis not present

## 2021-01-27 DIAGNOSIS — I517 Cardiomegaly: Secondary | ICD-10-CM | POA: Diagnosis not present

## 2021-01-27 DIAGNOSIS — R5381 Other malaise: Secondary | ICD-10-CM | POA: Diagnosis not present

## 2021-01-27 DIAGNOSIS — I272 Pulmonary hypertension, unspecified: Secondary | ICD-10-CM | POA: Diagnosis not present

## 2021-01-27 DIAGNOSIS — G8929 Other chronic pain: Secondary | ICD-10-CM | POA: Diagnosis not present

## 2021-01-27 DIAGNOSIS — R296 Repeated falls: Secondary | ICD-10-CM | POA: Diagnosis not present

## 2021-01-27 DIAGNOSIS — I110 Hypertensive heart disease with heart failure: Secondary | ICD-10-CM | POA: Diagnosis not present

## 2021-01-27 DIAGNOSIS — E785 Hyperlipidemia, unspecified: Secondary | ICD-10-CM | POA: Diagnosis not present

## 2021-01-27 DIAGNOSIS — E876 Hypokalemia: Secondary | ICD-10-CM | POA: Diagnosis not present

## 2021-01-27 DIAGNOSIS — R7881 Bacteremia: Secondary | ICD-10-CM | POA: Diagnosis not present

## 2021-01-27 DIAGNOSIS — M25511 Pain in right shoulder: Secondary | ICD-10-CM | POA: Diagnosis not present

## 2021-01-27 DIAGNOSIS — M25512 Pain in left shoulder: Secondary | ICD-10-CM | POA: Diagnosis not present

## 2021-01-27 DIAGNOSIS — B9561 Methicillin susceptible Staphylococcus aureus infection as the cause of diseases classified elsewhere: Secondary | ICD-10-CM | POA: Diagnosis not present

## 2021-01-27 DIAGNOSIS — F1010 Alcohol abuse, uncomplicated: Secondary | ICD-10-CM | POA: Diagnosis not present

## 2021-01-27 DIAGNOSIS — J811 Chronic pulmonary edema: Secondary | ICD-10-CM | POA: Diagnosis not present

## 2021-01-27 DIAGNOSIS — R251 Tremor, unspecified: Secondary | ICD-10-CM | POA: Diagnosis not present

## 2021-01-27 DIAGNOSIS — W19XXXA Unspecified fall, initial encounter: Secondary | ICD-10-CM | POA: Diagnosis not present

## 2021-01-27 DIAGNOSIS — Z792 Long term (current) use of antibiotics: Secondary | ICD-10-CM | POA: Diagnosis not present

## 2021-01-27 DIAGNOSIS — D649 Anemia, unspecified: Secondary | ICD-10-CM | POA: Diagnosis not present

## 2021-01-27 DIAGNOSIS — I5031 Acute diastolic (congestive) heart failure: Secondary | ICD-10-CM | POA: Diagnosis not present

## 2021-01-27 DIAGNOSIS — I330 Acute and subacute infective endocarditis: Secondary | ICD-10-CM | POA: Diagnosis not present

## 2021-01-27 DIAGNOSIS — E8809 Other disorders of plasma-protein metabolism, not elsewhere classified: Secondary | ICD-10-CM | POA: Diagnosis not present

## 2021-01-27 DIAGNOSIS — R600 Localized edema: Secondary | ICD-10-CM | POA: Diagnosis not present

## 2021-01-27 DIAGNOSIS — S8011XA Contusion of right lower leg, initial encounter: Secondary | ICD-10-CM | POA: Diagnosis not present

## 2021-01-27 DIAGNOSIS — E440 Moderate protein-calorie malnutrition: Secondary | ICD-10-CM | POA: Diagnosis not present

## 2021-01-27 DIAGNOSIS — Z79899 Other long term (current) drug therapy: Secondary | ICD-10-CM | POA: Diagnosis not present

## 2021-01-29 DIAGNOSIS — S0083XA Contusion of other part of head, initial encounter: Secondary | ICD-10-CM | POA: Diagnosis not present

## 2021-01-29 DIAGNOSIS — W19XXXA Unspecified fall, initial encounter: Secondary | ICD-10-CM | POA: Diagnosis not present

## 2021-01-29 DIAGNOSIS — Z79899 Other long term (current) drug therapy: Secondary | ICD-10-CM | POA: Diagnosis not present

## 2021-01-29 DIAGNOSIS — F1010 Alcohol abuse, uncomplicated: Secondary | ICD-10-CM | POA: Diagnosis not present

## 2021-01-29 DIAGNOSIS — E878 Other disorders of electrolyte and fluid balance, not elsewhere classified: Secondary | ICD-10-CM | POA: Diagnosis not present

## 2021-01-29 DIAGNOSIS — R296 Repeated falls: Secondary | ICD-10-CM | POA: Diagnosis not present

## 2021-01-30 DIAGNOSIS — M25512 Pain in left shoulder: Secondary | ICD-10-CM | POA: Diagnosis not present

## 2021-01-30 DIAGNOSIS — S0083XA Contusion of other part of head, initial encounter: Secondary | ICD-10-CM | POA: Diagnosis not present

## 2021-01-30 DIAGNOSIS — Z96612 Presence of left artificial shoulder joint: Secondary | ICD-10-CM | POA: Diagnosis not present

## 2021-01-30 DIAGNOSIS — R296 Repeated falls: Secondary | ICD-10-CM | POA: Diagnosis not present

## 2021-01-30 DIAGNOSIS — F1010 Alcohol abuse, uncomplicated: Secondary | ICD-10-CM | POA: Diagnosis not present

## 2021-01-30 DIAGNOSIS — R059 Cough, unspecified: Secondary | ICD-10-CM | POA: Diagnosis not present

## 2021-01-30 DIAGNOSIS — M898X1 Other specified disorders of bone, shoulder: Secondary | ICD-10-CM | POA: Diagnosis not present

## 2021-01-30 DIAGNOSIS — M25511 Pain in right shoulder: Secondary | ICD-10-CM | POA: Diagnosis not present

## 2021-01-30 DIAGNOSIS — G8929 Other chronic pain: Secondary | ICD-10-CM | POA: Diagnosis not present

## 2021-01-30 DIAGNOSIS — D72829 Elevated white blood cell count, unspecified: Secondary | ICD-10-CM | POA: Diagnosis not present

## 2021-01-31 DIAGNOSIS — W19XXXA Unspecified fall, initial encounter: Secondary | ICD-10-CM | POA: Diagnosis not present

## 2021-01-31 DIAGNOSIS — S0083XA Contusion of other part of head, initial encounter: Secondary | ICD-10-CM | POA: Diagnosis not present

## 2021-01-31 DIAGNOSIS — F419 Anxiety disorder, unspecified: Secondary | ICD-10-CM | POA: Diagnosis not present

## 2021-01-31 DIAGNOSIS — Z79899 Other long term (current) drug therapy: Secondary | ICD-10-CM | POA: Diagnosis not present

## 2021-01-31 DIAGNOSIS — E8809 Other disorders of plasma-protein metabolism, not elsewhere classified: Secondary | ICD-10-CM | POA: Diagnosis not present

## 2021-01-31 DIAGNOSIS — D72829 Elevated white blood cell count, unspecified: Secondary | ICD-10-CM | POA: Diagnosis not present

## 2021-01-31 DIAGNOSIS — K121 Other forms of stomatitis: Secondary | ICD-10-CM | POA: Diagnosis not present

## 2021-01-31 DIAGNOSIS — E871 Hypo-osmolality and hyponatremia: Secondary | ICD-10-CM | POA: Diagnosis not present

## 2021-01-31 DIAGNOSIS — F1010 Alcohol abuse, uncomplicated: Secondary | ICD-10-CM | POA: Diagnosis not present

## 2021-01-31 DIAGNOSIS — G8929 Other chronic pain: Secondary | ICD-10-CM | POA: Diagnosis not present

## 2021-02-01 DIAGNOSIS — K121 Other forms of stomatitis: Secondary | ICD-10-CM | POA: Diagnosis not present

## 2021-02-01 DIAGNOSIS — F419 Anxiety disorder, unspecified: Secondary | ICD-10-CM | POA: Diagnosis not present

## 2021-02-01 DIAGNOSIS — S8011XA Contusion of right lower leg, initial encounter: Secondary | ICD-10-CM | POA: Diagnosis not present

## 2021-02-01 DIAGNOSIS — S9031XA Contusion of right foot, initial encounter: Secondary | ICD-10-CM | POA: Diagnosis not present

## 2021-02-01 DIAGNOSIS — I272 Pulmonary hypertension, unspecified: Secondary | ICD-10-CM | POA: Diagnosis not present

## 2021-02-01 DIAGNOSIS — I082 Rheumatic disorders of both aortic and tricuspid valves: Secondary | ICD-10-CM | POA: Diagnosis not present

## 2021-02-01 DIAGNOSIS — R197 Diarrhea, unspecified: Secondary | ICD-10-CM | POA: Diagnosis not present

## 2021-02-01 DIAGNOSIS — E871 Hypo-osmolality and hyponatremia: Secondary | ICD-10-CM | POA: Diagnosis not present

## 2021-02-01 DIAGNOSIS — Z79899 Other long term (current) drug therapy: Secondary | ICD-10-CM | POA: Diagnosis not present

## 2021-02-01 DIAGNOSIS — R7881 Bacteremia: Secondary | ICD-10-CM | POA: Diagnosis not present

## 2021-02-01 DIAGNOSIS — R296 Repeated falls: Secondary | ICD-10-CM | POA: Diagnosis not present

## 2021-02-01 DIAGNOSIS — Z792 Long term (current) use of antibiotics: Secondary | ICD-10-CM | POA: Diagnosis not present

## 2021-02-01 DIAGNOSIS — E8809 Other disorders of plasma-protein metabolism, not elsewhere classified: Secondary | ICD-10-CM | POA: Diagnosis not present

## 2021-02-01 DIAGNOSIS — D72829 Elevated white blood cell count, unspecified: Secondary | ICD-10-CM | POA: Diagnosis not present

## 2021-02-01 DIAGNOSIS — W19XXXA Unspecified fall, initial encounter: Secondary | ICD-10-CM | POA: Diagnosis not present

## 2021-02-01 DIAGNOSIS — S0083XA Contusion of other part of head, initial encounter: Secondary | ICD-10-CM | POA: Diagnosis not present

## 2021-02-01 DIAGNOSIS — G8929 Other chronic pain: Secondary | ICD-10-CM | POA: Diagnosis not present

## 2021-02-02 DIAGNOSIS — R197 Diarrhea, unspecified: Secondary | ICD-10-CM | POA: Diagnosis not present

## 2021-02-02 DIAGNOSIS — D72829 Elevated white blood cell count, unspecified: Secondary | ICD-10-CM | POA: Diagnosis not present

## 2021-02-02 DIAGNOSIS — R251 Tremor, unspecified: Secondary | ICD-10-CM | POA: Diagnosis not present

## 2021-02-02 DIAGNOSIS — S0083XA Contusion of other part of head, initial encounter: Secondary | ICD-10-CM | POA: Diagnosis not present

## 2021-02-02 DIAGNOSIS — F419 Anxiety disorder, unspecified: Secondary | ICD-10-CM | POA: Diagnosis not present

## 2021-02-02 DIAGNOSIS — R600 Localized edema: Secondary | ICD-10-CM | POA: Diagnosis not present

## 2021-02-02 DIAGNOSIS — K121 Other forms of stomatitis: Secondary | ICD-10-CM | POA: Diagnosis not present

## 2021-02-02 DIAGNOSIS — S8011XA Contusion of right lower leg, initial encounter: Secondary | ICD-10-CM | POA: Diagnosis not present

## 2021-02-02 DIAGNOSIS — R7881 Bacteremia: Secondary | ICD-10-CM | POA: Diagnosis not present

## 2021-02-02 DIAGNOSIS — E8809 Other disorders of plasma-protein metabolism, not elsewhere classified: Secondary | ICD-10-CM | POA: Diagnosis not present

## 2021-02-02 DIAGNOSIS — G9341 Metabolic encephalopathy: Secondary | ICD-10-CM | POA: Diagnosis not present

## 2021-02-02 DIAGNOSIS — B9689 Other specified bacterial agents as the cause of diseases classified elsewhere: Secondary | ICD-10-CM | POA: Diagnosis not present

## 2021-02-03 DIAGNOSIS — D72829 Elevated white blood cell count, unspecified: Secondary | ICD-10-CM | POA: Diagnosis not present

## 2021-02-03 DIAGNOSIS — J90 Pleural effusion, not elsewhere classified: Secondary | ICD-10-CM | POA: Diagnosis not present

## 2021-02-03 DIAGNOSIS — R770 Abnormality of albumin: Secondary | ICD-10-CM | POA: Diagnosis not present

## 2021-02-03 DIAGNOSIS — R7989 Other specified abnormal findings of blood chemistry: Secondary | ICD-10-CM | POA: Diagnosis not present

## 2021-02-03 DIAGNOSIS — I517 Cardiomegaly: Secondary | ICD-10-CM | POA: Diagnosis not present

## 2021-02-03 DIAGNOSIS — J811 Chronic pulmonary edema: Secondary | ICD-10-CM | POA: Diagnosis not present

## 2021-02-03 DIAGNOSIS — R7881 Bacteremia: Secondary | ICD-10-CM | POA: Diagnosis not present

## 2021-02-03 DIAGNOSIS — W19XXXA Unspecified fall, initial encounter: Secondary | ICD-10-CM | POA: Diagnosis not present

## 2021-02-03 DIAGNOSIS — E876 Hypokalemia: Secondary | ICD-10-CM | POA: Diagnosis not present

## 2021-02-03 DIAGNOSIS — J9811 Atelectasis: Secondary | ICD-10-CM | POA: Diagnosis not present

## 2021-02-03 DIAGNOSIS — K121 Other forms of stomatitis: Secondary | ICD-10-CM | POA: Diagnosis not present

## 2021-02-04 DIAGNOSIS — F419 Anxiety disorder, unspecified: Secondary | ICD-10-CM | POA: Diagnosis not present

## 2021-02-04 DIAGNOSIS — R911 Solitary pulmonary nodule: Secondary | ICD-10-CM | POA: Diagnosis not present

## 2021-02-04 DIAGNOSIS — K121 Other forms of stomatitis: Secondary | ICD-10-CM | POA: Diagnosis not present

## 2021-02-04 DIAGNOSIS — R600 Localized edema: Secondary | ICD-10-CM | POA: Diagnosis not present

## 2021-02-04 DIAGNOSIS — E8809 Other disorders of plasma-protein metabolism, not elsewhere classified: Secondary | ICD-10-CM | POA: Diagnosis not present

## 2021-02-04 DIAGNOSIS — S8011XA Contusion of right lower leg, initial encounter: Secondary | ICD-10-CM | POA: Diagnosis not present

## 2021-02-04 DIAGNOSIS — N200 Calculus of kidney: Secondary | ICD-10-CM | POA: Diagnosis not present

## 2021-02-04 DIAGNOSIS — R197 Diarrhea, unspecified: Secondary | ICD-10-CM | POA: Diagnosis not present

## 2021-02-04 DIAGNOSIS — E876 Hypokalemia: Secondary | ICD-10-CM | POA: Diagnosis not present

## 2021-02-04 DIAGNOSIS — J90 Pleural effusion, not elsewhere classified: Secondary | ICD-10-CM | POA: Diagnosis not present

## 2021-02-04 DIAGNOSIS — G9341 Metabolic encephalopathy: Secondary | ICD-10-CM | POA: Diagnosis not present

## 2021-02-04 DIAGNOSIS — B9561 Methicillin susceptible Staphylococcus aureus infection as the cause of diseases classified elsewhere: Secondary | ICD-10-CM | POA: Diagnosis not present

## 2021-02-04 DIAGNOSIS — G8929 Other chronic pain: Secondary | ICD-10-CM | POA: Diagnosis not present

## 2021-02-04 DIAGNOSIS — R7881 Bacteremia: Secondary | ICD-10-CM | POA: Diagnosis not present

## 2021-02-04 DIAGNOSIS — D72829 Elevated white blood cell count, unspecified: Secondary | ICD-10-CM | POA: Diagnosis not present

## 2021-02-05 DIAGNOSIS — D72829 Elevated white blood cell count, unspecified: Secondary | ICD-10-CM | POA: Diagnosis not present

## 2021-02-05 DIAGNOSIS — K121 Other forms of stomatitis: Secondary | ICD-10-CM | POA: Diagnosis not present

## 2021-02-05 DIAGNOSIS — E876 Hypokalemia: Secondary | ICD-10-CM | POA: Diagnosis not present

## 2021-02-05 DIAGNOSIS — G9341 Metabolic encephalopathy: Secondary | ICD-10-CM | POA: Diagnosis not present

## 2021-02-05 DIAGNOSIS — E8809 Other disorders of plasma-protein metabolism, not elsewhere classified: Secondary | ICD-10-CM | POA: Diagnosis not present

## 2021-02-05 DIAGNOSIS — R7989 Other specified abnormal findings of blood chemistry: Secondary | ICD-10-CM | POA: Diagnosis not present

## 2021-02-05 DIAGNOSIS — G8929 Other chronic pain: Secondary | ICD-10-CM | POA: Diagnosis not present

## 2021-02-05 DIAGNOSIS — F419 Anxiety disorder, unspecified: Secondary | ICD-10-CM | POA: Diagnosis not present

## 2021-02-05 DIAGNOSIS — R7881 Bacteremia: Secondary | ICD-10-CM | POA: Diagnosis not present

## 2021-02-05 DIAGNOSIS — J90 Pleural effusion, not elsewhere classified: Secondary | ICD-10-CM | POA: Diagnosis not present

## 2021-02-05 DIAGNOSIS — B9689 Other specified bacterial agents as the cause of diseases classified elsewhere: Secondary | ICD-10-CM | POA: Diagnosis not present

## 2021-02-06 DIAGNOSIS — R310 Gross hematuria: Secondary | ICD-10-CM | POA: Diagnosis not present

## 2021-02-06 DIAGNOSIS — R451 Restlessness and agitation: Secondary | ICD-10-CM | POA: Diagnosis not present

## 2021-02-06 DIAGNOSIS — M47816 Spondylosis without myelopathy or radiculopathy, lumbar region: Secondary | ICD-10-CM | POA: Diagnosis not present

## 2021-02-06 DIAGNOSIS — I824Z2 Acute embolism and thrombosis of unspecified deep veins of left distal lower extremity: Secondary | ICD-10-CM | POA: Diagnosis not present

## 2021-02-06 DIAGNOSIS — I82403 Acute embolism and thrombosis of unspecified deep veins of lower extremity, bilateral: Secondary | ICD-10-CM | POA: Diagnosis not present

## 2021-02-06 DIAGNOSIS — F329 Major depressive disorder, single episode, unspecified: Secondary | ICD-10-CM | POA: Diagnosis not present

## 2021-02-06 DIAGNOSIS — I2693 Single subsegmental pulmonary embolism without acute cor pulmonale: Secondary | ICD-10-CM | POA: Diagnosis not present

## 2021-02-06 DIAGNOSIS — M799 Soft tissue disorder, unspecified: Secondary | ICD-10-CM | POA: Diagnosis not present

## 2021-02-06 DIAGNOSIS — R911 Solitary pulmonary nodule: Secondary | ICD-10-CM | POA: Diagnosis not present

## 2021-02-06 DIAGNOSIS — I824Z3 Acute embolism and thrombosis of unspecified deep veins of distal lower extremity, bilateral: Secondary | ICD-10-CM | POA: Diagnosis not present

## 2021-02-06 DIAGNOSIS — Z96612 Presence of left artificial shoulder joint: Secondary | ICD-10-CM | POA: Diagnosis not present

## 2021-02-06 DIAGNOSIS — M546 Pain in thoracic spine: Secondary | ICD-10-CM | POA: Diagnosis not present

## 2021-02-06 DIAGNOSIS — R262 Difficulty in walking, not elsewhere classified: Secondary | ICD-10-CM | POA: Diagnosis not present

## 2021-02-06 DIAGNOSIS — R0989 Other specified symptoms and signs involving the circulatory and respiratory systems: Secondary | ICD-10-CM | POA: Diagnosis not present

## 2021-02-06 DIAGNOSIS — M6281 Muscle weakness (generalized): Secondary | ICD-10-CM | POA: Diagnosis not present

## 2021-02-06 DIAGNOSIS — I2510 Atherosclerotic heart disease of native coronary artery without angina pectoris: Secondary | ICD-10-CM | POA: Diagnosis not present

## 2021-02-06 DIAGNOSIS — I824Y3 Acute embolism and thrombosis of unspecified deep veins of proximal lower extremity, bilateral: Secondary | ICD-10-CM | POA: Diagnosis not present

## 2021-02-06 DIAGNOSIS — I4949 Other premature depolarization: Secondary | ICD-10-CM | POA: Diagnosis not present

## 2021-02-06 DIAGNOSIS — R918 Other nonspecific abnormal finding of lung field: Secondary | ICD-10-CM | POA: Diagnosis not present

## 2021-02-06 DIAGNOSIS — S2241XA Multiple fractures of ribs, right side, initial encounter for closed fracture: Secondary | ICD-10-CM | POA: Diagnosis not present

## 2021-02-06 DIAGNOSIS — E876 Hypokalemia: Secondary | ICD-10-CM | POA: Diagnosis not present

## 2021-02-06 DIAGNOSIS — M4626 Osteomyelitis of vertebra, lumbar region: Secondary | ICD-10-CM | POA: Diagnosis not present

## 2021-02-06 DIAGNOSIS — K1370 Unspecified lesions of oral mucosa: Secondary | ICD-10-CM | POA: Diagnosis not present

## 2021-02-06 DIAGNOSIS — D649 Anemia, unspecified: Secondary | ICD-10-CM | POA: Diagnosis not present

## 2021-02-06 DIAGNOSIS — R9431 Abnormal electrocardiogram [ECG] [EKG]: Secondary | ICD-10-CM | POA: Diagnosis not present

## 2021-02-06 DIAGNOSIS — I330 Acute and subacute infective endocarditis: Secondary | ICD-10-CM | POA: Diagnosis not present

## 2021-02-06 DIAGNOSIS — F1020 Alcohol dependence, uncomplicated: Secondary | ICD-10-CM | POA: Diagnosis not present

## 2021-02-06 DIAGNOSIS — R296 Repeated falls: Secondary | ICD-10-CM | POA: Diagnosis not present

## 2021-02-06 DIAGNOSIS — I359 Nonrheumatic aortic valve disorder, unspecified: Secondary | ICD-10-CM | POA: Diagnosis not present

## 2021-02-06 DIAGNOSIS — I77819 Aortic ectasia, unspecified site: Secondary | ICD-10-CM | POA: Diagnosis not present

## 2021-02-06 DIAGNOSIS — M47812 Spondylosis without myelopathy or radiculopathy, cervical region: Secondary | ICD-10-CM | POA: Diagnosis not present

## 2021-02-06 DIAGNOSIS — K219 Gastro-esophageal reflux disease without esophagitis: Secondary | ICD-10-CM | POA: Diagnosis not present

## 2021-02-06 DIAGNOSIS — W19XXXD Unspecified fall, subsequent encounter: Secondary | ICD-10-CM | POA: Diagnosis not present

## 2021-02-06 DIAGNOSIS — L89629 Pressure ulcer of left heel, unspecified stage: Secondary | ICD-10-CM | POA: Diagnosis not present

## 2021-02-06 DIAGNOSIS — R319 Hematuria, unspecified: Secondary | ICD-10-CM | POA: Diagnosis not present

## 2021-02-06 DIAGNOSIS — D638 Anemia in other chronic diseases classified elsewhere: Secondary | ICD-10-CM | POA: Diagnosis not present

## 2021-02-06 DIAGNOSIS — D72829 Elevated white blood cell count, unspecified: Secondary | ICD-10-CM | POA: Diagnosis not present

## 2021-02-06 DIAGNOSIS — M109 Gout, unspecified: Secondary | ICD-10-CM | POA: Diagnosis not present

## 2021-02-06 DIAGNOSIS — S2231XD Fracture of one rib, right side, subsequent encounter for fracture with routine healing: Secondary | ICD-10-CM | POA: Diagnosis not present

## 2021-02-06 DIAGNOSIS — G2579 Other drug induced movement disorders: Secondary | ICD-10-CM | POA: Diagnosis not present

## 2021-02-06 DIAGNOSIS — I351 Nonrheumatic aortic (valve) insufficiency: Secondary | ICD-10-CM | POA: Diagnosis not present

## 2021-02-06 DIAGNOSIS — I10 Essential (primary) hypertension: Secondary | ICD-10-CM | POA: Diagnosis not present

## 2021-02-06 DIAGNOSIS — F331 Major depressive disorder, recurrent, moderate: Secondary | ICD-10-CM | POA: Diagnosis not present

## 2021-02-06 DIAGNOSIS — I444 Left anterior fascicular block: Secondary | ICD-10-CM | POA: Diagnosis not present

## 2021-02-06 DIAGNOSIS — I061 Rheumatic aortic insufficiency: Secondary | ICD-10-CM | POA: Diagnosis not present

## 2021-02-06 DIAGNOSIS — E785 Hyperlipidemia, unspecified: Secondary | ICD-10-CM | POA: Diagnosis not present

## 2021-02-06 DIAGNOSIS — R7881 Bacteremia: Secondary | ICD-10-CM | POA: Diagnosis not present

## 2021-02-06 DIAGNOSIS — S2231XA Fracture of one rib, right side, initial encounter for closed fracture: Secondary | ICD-10-CM | POA: Diagnosis not present

## 2021-02-06 DIAGNOSIS — I499 Cardiac arrhythmia, unspecified: Secondary | ICD-10-CM | POA: Diagnosis not present

## 2021-02-06 DIAGNOSIS — I080 Rheumatic disorders of both mitral and aortic valves: Secondary | ICD-10-CM | POA: Diagnosis not present

## 2021-02-06 DIAGNOSIS — F1010 Alcohol abuse, uncomplicated: Secondary | ICD-10-CM | POA: Diagnosis not present

## 2021-02-06 DIAGNOSIS — E440 Moderate protein-calorie malnutrition: Secondary | ICD-10-CM | POA: Diagnosis not present

## 2021-02-06 DIAGNOSIS — M4640 Discitis, unspecified, site unspecified: Secondary | ICD-10-CM | POA: Diagnosis not present

## 2021-02-06 DIAGNOSIS — Z452 Encounter for adjustment and management of vascular access device: Secondary | ICD-10-CM | POA: Diagnosis not present

## 2021-02-06 DIAGNOSIS — K121 Other forms of stomatitis: Secondary | ICD-10-CM | POA: Diagnosis not present

## 2021-02-06 DIAGNOSIS — R410 Disorientation, unspecified: Secondary | ICD-10-CM | POA: Diagnosis not present

## 2021-02-06 DIAGNOSIS — D62 Acute posthemorrhagic anemia: Secondary | ICD-10-CM | POA: Diagnosis not present

## 2021-02-06 DIAGNOSIS — M48061 Spinal stenosis, lumbar region without neurogenic claudication: Secondary | ICD-10-CM | POA: Diagnosis not present

## 2021-02-06 DIAGNOSIS — R601 Generalized edema: Secondary | ICD-10-CM | POA: Diagnosis not present

## 2021-02-06 DIAGNOSIS — I959 Hypotension, unspecified: Secondary | ICD-10-CM | POA: Diagnosis not present

## 2021-02-06 DIAGNOSIS — N3289 Other specified disorders of bladder: Secondary | ICD-10-CM | POA: Diagnosis not present

## 2021-02-06 DIAGNOSIS — Z20822 Contact with and (suspected) exposure to covid-19: Secondary | ICD-10-CM | POA: Diagnosis not present

## 2021-02-06 DIAGNOSIS — B9561 Methicillin susceptible Staphylococcus aureus infection as the cause of diseases classified elsewhere: Secondary | ICD-10-CM | POA: Diagnosis not present

## 2021-02-06 DIAGNOSIS — R770 Abnormality of albumin: Secondary | ICD-10-CM | POA: Diagnosis not present

## 2021-02-06 DIAGNOSIS — J90 Pleural effusion, not elsewhere classified: Secondary | ICD-10-CM | POA: Diagnosis not present

## 2021-02-06 DIAGNOSIS — Z0389 Encounter for observation for other suspected diseases and conditions ruled out: Secondary | ICD-10-CM | POA: Diagnosis not present

## 2021-02-06 DIAGNOSIS — I2609 Other pulmonary embolism with acute cor pulmonale: Secondary | ICD-10-CM | POA: Diagnosis not present

## 2021-02-06 DIAGNOSIS — I2699 Other pulmonary embolism without acute cor pulmonale: Secondary | ICD-10-CM | POA: Diagnosis not present

## 2021-02-06 DIAGNOSIS — M549 Dorsalgia, unspecified: Secondary | ICD-10-CM | POA: Diagnosis not present

## 2021-02-06 DIAGNOSIS — E8770 Fluid overload, unspecified: Secondary | ICD-10-CM | POA: Diagnosis not present

## 2021-02-06 DIAGNOSIS — Z9884 Bariatric surgery status: Secondary | ICD-10-CM | POA: Diagnosis not present

## 2021-02-06 DIAGNOSIS — R5381 Other malaise: Secondary | ICD-10-CM | POA: Diagnosis not present

## 2021-02-06 DIAGNOSIS — B958 Unspecified staphylococcus as the cause of diseases classified elsewhere: Secondary | ICD-10-CM | POA: Diagnosis not present

## 2021-02-06 DIAGNOSIS — B9562 Methicillin resistant Staphylococcus aureus infection as the cause of diseases classified elsewhere: Secondary | ICD-10-CM | POA: Diagnosis not present

## 2021-02-06 DIAGNOSIS — Z634 Disappearance and death of family member: Secondary | ICD-10-CM | POA: Diagnosis not present

## 2021-02-06 DIAGNOSIS — G894 Chronic pain syndrome: Secondary | ICD-10-CM | POA: Diagnosis not present

## 2021-02-06 DIAGNOSIS — F0390 Unspecified dementia without behavioral disturbance: Secondary | ICD-10-CM | POA: Diagnosis not present

## 2021-02-06 DIAGNOSIS — E669 Obesity, unspecified: Secondary | ICD-10-CM | POA: Diagnosis not present

## 2021-02-06 DIAGNOSIS — R4182 Altered mental status, unspecified: Secondary | ICD-10-CM | POA: Diagnosis not present

## 2021-02-06 DIAGNOSIS — G062 Extradural and subdural abscess, unspecified: Secondary | ICD-10-CM | POA: Diagnosis not present

## 2021-02-06 DIAGNOSIS — M47814 Spondylosis without myelopathy or radiculopathy, thoracic region: Secondary | ICD-10-CM | POA: Diagnosis not present

## 2021-02-06 DIAGNOSIS — G9341 Metabolic encephalopathy: Secondary | ICD-10-CM | POA: Diagnosis not present

## 2021-02-06 DIAGNOSIS — I2602 Saddle embolus of pulmonary artery with acute cor pulmonale: Secondary | ICD-10-CM | POA: Diagnosis not present

## 2021-02-06 DIAGNOSIS — R609 Edema, unspecified: Secondary | ICD-10-CM | POA: Diagnosis not present

## 2021-02-06 DIAGNOSIS — Z9181 History of falling: Secondary | ICD-10-CM | POA: Diagnosis not present

## 2021-02-06 DIAGNOSIS — L89619 Pressure ulcer of right heel, unspecified stage: Secondary | ICD-10-CM | POA: Diagnosis not present

## 2021-02-06 DIAGNOSIS — E8809 Other disorders of plasma-protein metabolism, not elsewhere classified: Secondary | ICD-10-CM | POA: Diagnosis not present

## 2021-02-06 DIAGNOSIS — M4646 Discitis, unspecified, lumbar region: Secondary | ICD-10-CM | POA: Diagnosis not present

## 2021-02-06 DIAGNOSIS — Z743 Need for continuous supervision: Secondary | ICD-10-CM | POA: Diagnosis not present

## 2021-02-06 DIAGNOSIS — E871 Hypo-osmolality and hyponatremia: Secondary | ICD-10-CM | POA: Diagnosis not present

## 2021-02-06 DIAGNOSIS — Z792 Long term (current) use of antibiotics: Secondary | ICD-10-CM | POA: Diagnosis not present

## 2021-02-06 DIAGNOSIS — F32A Depression, unspecified: Secondary | ICD-10-CM | POA: Diagnosis not present

## 2021-02-06 DIAGNOSIS — R279 Unspecified lack of coordination: Secondary | ICD-10-CM | POA: Diagnosis not present

## 2021-02-06 DIAGNOSIS — I4510 Unspecified right bundle-branch block: Secondary | ICD-10-CM | POA: Diagnosis not present

## 2021-02-06 DIAGNOSIS — I38 Endocarditis, valve unspecified: Secondary | ICD-10-CM | POA: Diagnosis not present

## 2021-02-06 DIAGNOSIS — G061 Intraspinal abscess and granuloma: Secondary | ICD-10-CM | POA: Diagnosis not present

## 2021-02-07 DIAGNOSIS — B9561 Methicillin susceptible Staphylococcus aureus infection as the cause of diseases classified elsewhere: Secondary | ICD-10-CM | POA: Diagnosis not present

## 2021-02-07 DIAGNOSIS — I2602 Saddle embolus of pulmonary artery with acute cor pulmonale: Secondary | ICD-10-CM | POA: Diagnosis not present

## 2021-02-07 DIAGNOSIS — D72829 Elevated white blood cell count, unspecified: Secondary | ICD-10-CM | POA: Diagnosis not present

## 2021-02-07 DIAGNOSIS — I499 Cardiac arrhythmia, unspecified: Secondary | ICD-10-CM | POA: Diagnosis not present

## 2021-02-07 DIAGNOSIS — I4510 Unspecified right bundle-branch block: Secondary | ICD-10-CM | POA: Diagnosis not present

## 2021-02-07 DIAGNOSIS — R7881 Bacteremia: Secondary | ICD-10-CM | POA: Diagnosis not present

## 2021-02-08 DIAGNOSIS — I824Z2 Acute embolism and thrombosis of unspecified deep veins of left distal lower extremity: Secondary | ICD-10-CM | POA: Diagnosis not present

## 2021-02-08 DIAGNOSIS — I824Y3 Acute embolism and thrombosis of unspecified deep veins of proximal lower extremity, bilateral: Secondary | ICD-10-CM | POA: Diagnosis not present

## 2021-02-08 DIAGNOSIS — B9561 Methicillin susceptible Staphylococcus aureus infection as the cause of diseases classified elsewhere: Secondary | ICD-10-CM | POA: Diagnosis not present

## 2021-02-08 DIAGNOSIS — R7881 Bacteremia: Secondary | ICD-10-CM | POA: Diagnosis not present

## 2021-02-08 DIAGNOSIS — I2602 Saddle embolus of pulmonary artery with acute cor pulmonale: Secondary | ICD-10-CM | POA: Diagnosis not present

## 2021-02-09 DIAGNOSIS — I4510 Unspecified right bundle-branch block: Secondary | ICD-10-CM | POA: Diagnosis not present

## 2021-02-09 DIAGNOSIS — I499 Cardiac arrhythmia, unspecified: Secondary | ICD-10-CM | POA: Diagnosis not present

## 2021-02-10 DIAGNOSIS — F1010 Alcohol abuse, uncomplicated: Secondary | ICD-10-CM | POA: Diagnosis not present

## 2021-02-10 DIAGNOSIS — R911 Solitary pulmonary nodule: Secondary | ICD-10-CM | POA: Diagnosis not present

## 2021-02-10 DIAGNOSIS — I2602 Saddle embolus of pulmonary artery with acute cor pulmonale: Secondary | ICD-10-CM | POA: Diagnosis not present

## 2021-02-10 DIAGNOSIS — R7881 Bacteremia: Secondary | ICD-10-CM | POA: Diagnosis not present

## 2021-02-10 DIAGNOSIS — F331 Major depressive disorder, recurrent, moderate: Secondary | ICD-10-CM | POA: Diagnosis not present

## 2021-02-10 DIAGNOSIS — Z634 Disappearance and death of family member: Secondary | ICD-10-CM | POA: Diagnosis not present

## 2021-02-10 DIAGNOSIS — B9561 Methicillin susceptible Staphylococcus aureus infection as the cause of diseases classified elsewhere: Secondary | ICD-10-CM | POA: Diagnosis not present

## 2021-02-11 DIAGNOSIS — I359 Nonrheumatic aortic valve disorder, unspecified: Secondary | ICD-10-CM | POA: Diagnosis not present

## 2021-02-11 DIAGNOSIS — M549 Dorsalgia, unspecified: Secondary | ICD-10-CM | POA: Diagnosis not present

## 2021-02-11 DIAGNOSIS — D649 Anemia, unspecified: Secondary | ICD-10-CM | POA: Diagnosis not present

## 2021-02-11 DIAGNOSIS — I2693 Single subsegmental pulmonary embolism without acute cor pulmonale: Secondary | ICD-10-CM | POA: Diagnosis not present

## 2021-02-11 DIAGNOSIS — B9561 Methicillin susceptible Staphylococcus aureus infection as the cause of diseases classified elsewhere: Secondary | ICD-10-CM | POA: Diagnosis not present

## 2021-02-11 DIAGNOSIS — I10 Essential (primary) hypertension: Secondary | ICD-10-CM | POA: Diagnosis not present

## 2021-02-11 DIAGNOSIS — I2602 Saddle embolus of pulmonary artery with acute cor pulmonale: Secondary | ICD-10-CM | POA: Diagnosis not present

## 2021-02-11 DIAGNOSIS — F0390 Unspecified dementia without behavioral disturbance: Secondary | ICD-10-CM | POA: Insufficient documentation

## 2021-02-11 DIAGNOSIS — I82403 Acute embolism and thrombosis of unspecified deep veins of lower extremity, bilateral: Secondary | ICD-10-CM | POA: Diagnosis not present

## 2021-02-11 DIAGNOSIS — R7881 Bacteremia: Secondary | ICD-10-CM | POA: Diagnosis not present

## 2021-02-11 DIAGNOSIS — Z634 Disappearance and death of family member: Secondary | ICD-10-CM | POA: Diagnosis not present

## 2021-02-11 DIAGNOSIS — F331 Major depressive disorder, recurrent, moderate: Secondary | ICD-10-CM | POA: Diagnosis not present

## 2021-02-11 DIAGNOSIS — F1010 Alcohol abuse, uncomplicated: Secondary | ICD-10-CM | POA: Diagnosis not present

## 2021-02-12 DIAGNOSIS — I82403 Acute embolism and thrombosis of unspecified deep veins of lower extremity, bilateral: Secondary | ICD-10-CM | POA: Diagnosis not present

## 2021-02-12 DIAGNOSIS — I38 Endocarditis, valve unspecified: Secondary | ICD-10-CM | POA: Diagnosis not present

## 2021-02-12 DIAGNOSIS — R7881 Bacteremia: Secondary | ICD-10-CM | POA: Diagnosis not present

## 2021-02-12 DIAGNOSIS — I2693 Single subsegmental pulmonary embolism without acute cor pulmonale: Secondary | ICD-10-CM | POA: Diagnosis not present

## 2021-02-12 DIAGNOSIS — B9561 Methicillin susceptible Staphylococcus aureus infection as the cause of diseases classified elsewhere: Secondary | ICD-10-CM | POA: Diagnosis not present

## 2021-02-13 DIAGNOSIS — I2602 Saddle embolus of pulmonary artery with acute cor pulmonale: Secondary | ICD-10-CM | POA: Diagnosis not present

## 2021-02-13 DIAGNOSIS — I4510 Unspecified right bundle-branch block: Secondary | ICD-10-CM | POA: Diagnosis not present

## 2021-02-13 DIAGNOSIS — I444 Left anterior fascicular block: Secondary | ICD-10-CM | POA: Diagnosis not present

## 2021-02-14 DIAGNOSIS — R7881 Bacteremia: Secondary | ICD-10-CM | POA: Diagnosis not present

## 2021-02-14 DIAGNOSIS — M48061 Spinal stenosis, lumbar region without neurogenic claudication: Secondary | ICD-10-CM | POA: Diagnosis not present

## 2021-02-14 DIAGNOSIS — I444 Left anterior fascicular block: Secondary | ICD-10-CM | POA: Diagnosis not present

## 2021-02-14 DIAGNOSIS — I2602 Saddle embolus of pulmonary artery with acute cor pulmonale: Secondary | ICD-10-CM | POA: Diagnosis not present

## 2021-02-14 DIAGNOSIS — M546 Pain in thoracic spine: Secondary | ICD-10-CM | POA: Diagnosis not present

## 2021-02-14 DIAGNOSIS — Z0389 Encounter for observation for other suspected diseases and conditions ruled out: Secondary | ICD-10-CM | POA: Diagnosis not present

## 2021-02-14 DIAGNOSIS — I4510 Unspecified right bundle-branch block: Secondary | ICD-10-CM | POA: Diagnosis not present

## 2021-02-15 DIAGNOSIS — R319 Hematuria, unspecified: Secondary | ICD-10-CM | POA: Diagnosis not present

## 2021-02-15 DIAGNOSIS — I38 Endocarditis, valve unspecified: Secondary | ICD-10-CM | POA: Diagnosis not present

## 2021-02-15 DIAGNOSIS — M47816 Spondylosis without myelopathy or radiculopathy, lumbar region: Secondary | ICD-10-CM | POA: Diagnosis not present

## 2021-02-15 DIAGNOSIS — B9561 Methicillin susceptible Staphylococcus aureus infection as the cause of diseases classified elsewhere: Secondary | ICD-10-CM | POA: Diagnosis not present

## 2021-02-15 DIAGNOSIS — R310 Gross hematuria: Secondary | ICD-10-CM | POA: Diagnosis not present

## 2021-02-15 DIAGNOSIS — R7881 Bacteremia: Secondary | ICD-10-CM | POA: Diagnosis not present

## 2021-02-15 DIAGNOSIS — I959 Hypotension, unspecified: Secondary | ICD-10-CM | POA: Diagnosis not present

## 2021-02-16 DIAGNOSIS — Z452 Encounter for adjustment and management of vascular access device: Secondary | ICD-10-CM | POA: Diagnosis not present

## 2021-02-16 DIAGNOSIS — R310 Gross hematuria: Secondary | ICD-10-CM | POA: Diagnosis not present

## 2021-02-16 DIAGNOSIS — N3289 Other specified disorders of bladder: Secondary | ICD-10-CM | POA: Diagnosis not present

## 2021-02-16 DIAGNOSIS — I2602 Saddle embolus of pulmonary artery with acute cor pulmonale: Secondary | ICD-10-CM | POA: Diagnosis not present

## 2021-02-17 DIAGNOSIS — N3289 Other specified disorders of bladder: Secondary | ICD-10-CM | POA: Diagnosis not present

## 2021-02-17 DIAGNOSIS — R310 Gross hematuria: Secondary | ICD-10-CM | POA: Diagnosis not present

## 2021-02-17 DIAGNOSIS — G9341 Metabolic encephalopathy: Secondary | ICD-10-CM | POA: Diagnosis not present

## 2021-02-17 DIAGNOSIS — Z452 Encounter for adjustment and management of vascular access device: Secondary | ICD-10-CM | POA: Diagnosis not present

## 2021-02-18 DIAGNOSIS — M47812 Spondylosis without myelopathy or radiculopathy, cervical region: Secondary | ICD-10-CM | POA: Diagnosis not present

## 2021-02-18 DIAGNOSIS — M47814 Spondylosis without myelopathy or radiculopathy, thoracic region: Secondary | ICD-10-CM | POA: Diagnosis not present

## 2021-02-18 DIAGNOSIS — I4510 Unspecified right bundle-branch block: Secondary | ICD-10-CM | POA: Diagnosis not present

## 2021-02-18 DIAGNOSIS — I499 Cardiac arrhythmia, unspecified: Secondary | ICD-10-CM | POA: Diagnosis not present

## 2021-02-22 DIAGNOSIS — I2602 Saddle embolus of pulmonary artery with acute cor pulmonale: Secondary | ICD-10-CM | POA: Diagnosis not present

## 2021-03-02 DIAGNOSIS — R9431 Abnormal electrocardiogram [ECG] [EKG]: Secondary | ICD-10-CM | POA: Diagnosis not present

## 2021-03-02 DIAGNOSIS — S2231XD Fracture of one rib, right side, subsequent encounter for fracture with routine healing: Secondary | ICD-10-CM | POA: Diagnosis not present

## 2021-03-02 DIAGNOSIS — Z9884 Bariatric surgery status: Secondary | ICD-10-CM | POA: Diagnosis not present

## 2021-03-02 DIAGNOSIS — F1020 Alcohol dependence, uncomplicated: Secondary | ICD-10-CM | POA: Diagnosis not present

## 2021-03-02 DIAGNOSIS — R5381 Other malaise: Secondary | ICD-10-CM | POA: Diagnosis not present

## 2021-03-02 DIAGNOSIS — I82409 Acute embolism and thrombosis of unspecified deep veins of unspecified lower extremity: Secondary | ICD-10-CM | POA: Diagnosis not present

## 2021-03-02 DIAGNOSIS — S8010XD Contusion of unspecified lower leg, subsequent encounter: Secondary | ICD-10-CM | POA: Diagnosis not present

## 2021-03-02 DIAGNOSIS — Z9181 History of falling: Secondary | ICD-10-CM | POA: Diagnosis not present

## 2021-03-02 DIAGNOSIS — R4182 Altered mental status, unspecified: Secondary | ICD-10-CM | POA: Diagnosis not present

## 2021-03-02 DIAGNOSIS — B958 Unspecified staphylococcus as the cause of diseases classified elsewhere: Secondary | ICD-10-CM | POA: Diagnosis not present

## 2021-03-02 DIAGNOSIS — F1010 Alcohol abuse, uncomplicated: Secondary | ICD-10-CM | POA: Diagnosis not present

## 2021-03-02 DIAGNOSIS — R7881 Bacteremia: Secondary | ICD-10-CM | POA: Diagnosis not present

## 2021-03-02 DIAGNOSIS — M109 Gout, unspecified: Secondary | ICD-10-CM | POA: Diagnosis not present

## 2021-03-02 DIAGNOSIS — I330 Acute and subacute infective endocarditis: Secondary | ICD-10-CM | POA: Diagnosis not present

## 2021-03-02 DIAGNOSIS — F329 Major depressive disorder, single episode, unspecified: Secondary | ICD-10-CM | POA: Diagnosis not present

## 2021-03-02 DIAGNOSIS — E785 Hyperlipidemia, unspecified: Secondary | ICD-10-CM | POA: Diagnosis not present

## 2021-03-02 DIAGNOSIS — M6281 Muscle weakness (generalized): Secondary | ICD-10-CM | POA: Diagnosis not present

## 2021-03-02 DIAGNOSIS — R279 Unspecified lack of coordination: Secondary | ICD-10-CM | POA: Diagnosis not present

## 2021-03-02 DIAGNOSIS — W19XXXD Unspecified fall, subsequent encounter: Secondary | ICD-10-CM | POA: Diagnosis not present

## 2021-03-02 DIAGNOSIS — E119 Type 2 diabetes mellitus without complications: Secondary | ICD-10-CM | POA: Diagnosis not present

## 2021-03-02 DIAGNOSIS — I10 Essential (primary) hypertension: Secondary | ICD-10-CM | POA: Diagnosis not present

## 2021-03-02 DIAGNOSIS — G894 Chronic pain syndrome: Secondary | ICD-10-CM | POA: Diagnosis not present

## 2021-03-02 DIAGNOSIS — R262 Difficulty in walking, not elsewhere classified: Secondary | ICD-10-CM | POA: Diagnosis not present

## 2021-03-02 DIAGNOSIS — G062 Extradural and subdural abscess, unspecified: Secondary | ICD-10-CM | POA: Diagnosis not present

## 2021-03-02 DIAGNOSIS — I2602 Saddle embolus of pulmonary artery with acute cor pulmonale: Secondary | ICD-10-CM | POA: Diagnosis not present

## 2021-03-02 DIAGNOSIS — B9561 Methicillin susceptible Staphylococcus aureus infection as the cause of diseases classified elsewhere: Secondary | ICD-10-CM | POA: Diagnosis not present

## 2021-03-02 DIAGNOSIS — D649 Anemia, unspecified: Secondary | ICD-10-CM | POA: Diagnosis not present

## 2021-03-03 DIAGNOSIS — F1020 Alcohol dependence, uncomplicated: Secondary | ICD-10-CM | POA: Diagnosis not present

## 2021-03-03 DIAGNOSIS — I2602 Saddle embolus of pulmonary artery with acute cor pulmonale: Secondary | ICD-10-CM | POA: Diagnosis not present

## 2021-03-03 DIAGNOSIS — D649 Anemia, unspecified: Secondary | ICD-10-CM | POA: Diagnosis not present

## 2021-03-03 DIAGNOSIS — M109 Gout, unspecified: Secondary | ICD-10-CM | POA: Diagnosis not present

## 2021-03-03 DIAGNOSIS — I10 Essential (primary) hypertension: Secondary | ICD-10-CM | POA: Diagnosis not present

## 2021-03-03 DIAGNOSIS — E785 Hyperlipidemia, unspecified: Secondary | ICD-10-CM | POA: Diagnosis not present

## 2021-03-03 DIAGNOSIS — I82409 Acute embolism and thrombosis of unspecified deep veins of unspecified lower extremity: Secondary | ICD-10-CM | POA: Diagnosis not present

## 2021-03-03 DIAGNOSIS — B9561 Methicillin susceptible Staphylococcus aureus infection as the cause of diseases classified elsewhere: Secondary | ICD-10-CM | POA: Diagnosis not present

## 2021-03-15 DIAGNOSIS — S8010XD Contusion of unspecified lower leg, subsequent encounter: Secondary | ICD-10-CM | POA: Diagnosis not present

## 2021-03-20 ENCOUNTER — Other Ambulatory Visit: Payer: Self-pay | Admitting: Cardiovascular Disease

## 2021-03-29 DIAGNOSIS — Z9181 History of falling: Secondary | ICD-10-CM | POA: Diagnosis not present

## 2021-03-29 DIAGNOSIS — G9341 Metabolic encephalopathy: Secondary | ICD-10-CM | POA: Diagnosis not present

## 2021-03-29 DIAGNOSIS — E669 Obesity, unspecified: Secondary | ICD-10-CM | POA: Diagnosis not present

## 2021-03-29 DIAGNOSIS — M79651 Pain in right thigh: Secondary | ICD-10-CM | POA: Diagnosis not present

## 2021-03-29 DIAGNOSIS — Z20822 Contact with and (suspected) exposure to covid-19: Secondary | ICD-10-CM | POA: Diagnosis not present

## 2021-03-29 DIAGNOSIS — Z86711 Personal history of pulmonary embolism: Secondary | ICD-10-CM | POA: Diagnosis not present

## 2021-03-29 DIAGNOSIS — Z7409 Other reduced mobility: Secondary | ICD-10-CM | POA: Diagnosis not present

## 2021-03-29 DIAGNOSIS — M549 Dorsalgia, unspecified: Secondary | ICD-10-CM | POA: Diagnosis not present

## 2021-03-29 DIAGNOSIS — I358 Other nonrheumatic aortic valve disorders: Secondary | ICD-10-CM | POA: Diagnosis not present

## 2021-03-29 DIAGNOSIS — Z96612 Presence of left artificial shoulder joint: Secondary | ICD-10-CM | POA: Diagnosis not present

## 2021-03-29 DIAGNOSIS — S3993XA Unspecified injury of pelvis, initial encounter: Secondary | ICD-10-CM | POA: Diagnosis not present

## 2021-03-29 DIAGNOSIS — R531 Weakness: Secondary | ICD-10-CM | POA: Diagnosis not present

## 2021-03-29 DIAGNOSIS — Z86718 Personal history of other venous thrombosis and embolism: Secondary | ICD-10-CM | POA: Diagnosis not present

## 2021-03-29 DIAGNOSIS — Z96652 Presence of left artificial knee joint: Secondary | ICD-10-CM | POA: Diagnosis not present

## 2021-03-29 DIAGNOSIS — M5136 Other intervertebral disc degeneration, lumbar region: Secondary | ICD-10-CM | POA: Diagnosis not present

## 2021-03-29 DIAGNOSIS — R2689 Other abnormalities of gait and mobility: Secondary | ICD-10-CM | POA: Diagnosis not present

## 2021-03-29 DIAGNOSIS — R7881 Bacteremia: Secondary | ICD-10-CM | POA: Diagnosis not present

## 2021-03-29 DIAGNOSIS — Z7901 Long term (current) use of anticoagulants: Secondary | ICD-10-CM | POA: Diagnosis not present

## 2021-03-29 DIAGNOSIS — Z6830 Body mass index (BMI) 30.0-30.9, adult: Secondary | ICD-10-CM | POA: Diagnosis not present

## 2021-03-29 DIAGNOSIS — Z951 Presence of aortocoronary bypass graft: Secondary | ICD-10-CM | POA: Diagnosis not present

## 2021-03-29 DIAGNOSIS — M4626 Osteomyelitis of vertebra, lumbar region: Secondary | ICD-10-CM | POA: Diagnosis not present

## 2021-03-29 DIAGNOSIS — Z96651 Presence of right artificial knee joint: Secondary | ICD-10-CM | POA: Diagnosis not present

## 2021-03-29 DIAGNOSIS — G061 Intraspinal abscess and granuloma: Secondary | ICD-10-CM | POA: Diagnosis not present

## 2021-03-29 DIAGNOSIS — W19XXXA Unspecified fall, initial encounter: Secondary | ICD-10-CM | POA: Diagnosis not present

## 2021-03-29 DIAGNOSIS — R680 Hypothermia, not associated with low environmental temperature: Secondary | ICD-10-CM | POA: Diagnosis not present

## 2021-03-29 DIAGNOSIS — Z96653 Presence of artificial knee joint, bilateral: Secondary | ICD-10-CM | POA: Diagnosis not present

## 2021-03-29 DIAGNOSIS — M79652 Pain in left thigh: Secondary | ICD-10-CM | POA: Diagnosis not present

## 2021-03-29 DIAGNOSIS — J9811 Atelectasis: Secondary | ICD-10-CM | POA: Diagnosis not present

## 2021-03-29 DIAGNOSIS — T68XXXA Hypothermia, initial encounter: Secondary | ICD-10-CM | POA: Diagnosis not present

## 2021-03-29 DIAGNOSIS — R4182 Altered mental status, unspecified: Secondary | ICD-10-CM | POA: Diagnosis not present

## 2021-03-29 DIAGNOSIS — I330 Acute and subacute infective endocarditis: Secondary | ICD-10-CM | POA: Diagnosis not present

## 2021-03-29 DIAGNOSIS — R262 Difficulty in walking, not elsewhere classified: Secondary | ICD-10-CM | POA: Diagnosis not present

## 2021-03-29 DIAGNOSIS — E785 Hyperlipidemia, unspecified: Secondary | ICD-10-CM | POA: Diagnosis not present

## 2021-03-29 DIAGNOSIS — Z79899 Other long term (current) drug therapy: Secondary | ICD-10-CM | POA: Insufficient documentation

## 2021-03-29 DIAGNOSIS — B9561 Methicillin susceptible Staphylococcus aureus infection as the cause of diseases classified elsewhere: Secondary | ICD-10-CM | POA: Diagnosis not present

## 2021-03-29 DIAGNOSIS — M545 Low back pain, unspecified: Secondary | ICD-10-CM | POA: Diagnosis not present

## 2021-03-29 DIAGNOSIS — I10 Essential (primary) hypertension: Secondary | ICD-10-CM | POA: Diagnosis not present

## 2021-03-30 DIAGNOSIS — B9561 Methicillin susceptible Staphylococcus aureus infection as the cause of diseases classified elsewhere: Secondary | ICD-10-CM | POA: Diagnosis not present

## 2021-03-30 DIAGNOSIS — R4182 Altered mental status, unspecified: Secondary | ICD-10-CM | POA: Diagnosis not present

## 2021-03-30 DIAGNOSIS — R7881 Bacteremia: Secondary | ICD-10-CM | POA: Diagnosis not present

## 2021-03-31 DIAGNOSIS — R4182 Altered mental status, unspecified: Secondary | ICD-10-CM | POA: Diagnosis not present

## 2021-03-31 DIAGNOSIS — Z86711 Personal history of pulmonary embolism: Secondary | ICD-10-CM | POA: Diagnosis not present

## 2021-03-31 DIAGNOSIS — Z7901 Long term (current) use of anticoagulants: Secondary | ICD-10-CM | POA: Diagnosis not present

## 2021-04-01 DIAGNOSIS — Z86711 Personal history of pulmonary embolism: Secondary | ICD-10-CM | POA: Diagnosis not present

## 2021-04-01 DIAGNOSIS — R4182 Altered mental status, unspecified: Secondary | ICD-10-CM | POA: Diagnosis not present

## 2021-04-01 DIAGNOSIS — Z7901 Long term (current) use of anticoagulants: Secondary | ICD-10-CM | POA: Diagnosis not present

## 2021-04-02 DIAGNOSIS — E785 Hyperlipidemia, unspecified: Secondary | ICD-10-CM | POA: Diagnosis not present

## 2021-04-02 DIAGNOSIS — B9561 Methicillin susceptible Staphylococcus aureus infection as the cause of diseases classified elsewhere: Secondary | ICD-10-CM | POA: Diagnosis not present

## 2021-04-02 DIAGNOSIS — Z79899 Other long term (current) drug therapy: Secondary | ICD-10-CM | POA: Diagnosis not present

## 2021-04-02 DIAGNOSIS — K219 Gastro-esophageal reflux disease without esophagitis: Secondary | ICD-10-CM | POA: Diagnosis not present

## 2021-04-02 DIAGNOSIS — R7881 Bacteremia: Secondary | ICD-10-CM | POA: Diagnosis not present

## 2021-04-02 DIAGNOSIS — G062 Extradural and subdural abscess, unspecified: Secondary | ICD-10-CM | POA: Diagnosis not present

## 2021-04-02 DIAGNOSIS — R680 Hypothermia, not associated with low environmental temperature: Secondary | ICD-10-CM | POA: Diagnosis not present

## 2021-04-02 DIAGNOSIS — G9341 Metabolic encephalopathy: Secondary | ICD-10-CM | POA: Diagnosis not present

## 2021-04-02 DIAGNOSIS — Z86711 Personal history of pulmonary embolism: Secondary | ICD-10-CM | POA: Diagnosis not present

## 2021-04-02 DIAGNOSIS — I358 Other nonrheumatic aortic valve disorders: Secondary | ICD-10-CM | POA: Diagnosis not present

## 2021-04-02 DIAGNOSIS — Z7901 Long term (current) use of anticoagulants: Secondary | ICD-10-CM | POA: Diagnosis not present

## 2021-04-05 DIAGNOSIS — R7881 Bacteremia: Secondary | ICD-10-CM | POA: Diagnosis not present

## 2021-04-05 DIAGNOSIS — R413 Other amnesia: Secondary | ICD-10-CM | POA: Diagnosis not present

## 2021-04-05 DIAGNOSIS — I2510 Atherosclerotic heart disease of native coronary artery without angina pectoris: Secondary | ICD-10-CM | POA: Diagnosis not present

## 2021-04-05 DIAGNOSIS — F1010 Alcohol abuse, uncomplicated: Secondary | ICD-10-CM | POA: Diagnosis not present

## 2021-04-05 DIAGNOSIS — R296 Repeated falls: Secondary | ICD-10-CM | POA: Diagnosis not present

## 2021-04-05 DIAGNOSIS — Z09 Encounter for follow-up examination after completed treatment for conditions other than malignant neoplasm: Secondary | ICD-10-CM | POA: Diagnosis not present

## 2021-04-05 DIAGNOSIS — G8929 Other chronic pain: Secondary | ICD-10-CM | POA: Diagnosis not present

## 2021-04-05 DIAGNOSIS — I10 Essential (primary) hypertension: Secondary | ICD-10-CM | POA: Diagnosis not present

## 2021-04-05 DIAGNOSIS — I2602 Saddle embolus of pulmonary artery with acute cor pulmonale: Secondary | ICD-10-CM | POA: Diagnosis not present

## 2021-04-05 DIAGNOSIS — G061 Intraspinal abscess and granuloma: Secondary | ICD-10-CM | POA: Diagnosis not present

## 2021-04-05 DIAGNOSIS — F325 Major depressive disorder, single episode, in full remission: Secondary | ICD-10-CM | POA: Diagnosis not present

## 2021-04-05 DIAGNOSIS — A4901 Methicillin susceptible Staphylococcus aureus infection, unspecified site: Secondary | ICD-10-CM | POA: Diagnosis not present

## 2021-04-09 ENCOUNTER — Ambulatory Visit: Payer: PPO | Admitting: Cardiovascular Disease

## 2021-04-16 DIAGNOSIS — Z79899 Other long term (current) drug therapy: Secondary | ICD-10-CM | POA: Diagnosis not present

## 2021-04-16 DIAGNOSIS — R5383 Other fatigue: Secondary | ICD-10-CM | POA: Diagnosis not present

## 2021-04-16 DIAGNOSIS — D649 Anemia, unspecified: Secondary | ICD-10-CM | POA: Diagnosis not present

## 2021-04-23 DIAGNOSIS — M25512 Pain in left shoulder: Secondary | ICD-10-CM | POA: Diagnosis not present

## 2021-04-23 DIAGNOSIS — Z79899 Other long term (current) drug therapy: Secondary | ICD-10-CM | POA: Diagnosis not present

## 2021-04-23 DIAGNOSIS — I2699 Other pulmonary embolism without acute cor pulmonale: Secondary | ICD-10-CM | POA: Diagnosis not present

## 2021-04-23 DIAGNOSIS — E782 Mixed hyperlipidemia: Secondary | ICD-10-CM | POA: Diagnosis not present

## 2021-04-23 DIAGNOSIS — I10 Essential (primary) hypertension: Secondary | ICD-10-CM | POA: Diagnosis not present

## 2021-04-23 DIAGNOSIS — M5136 Other intervertebral disc degeneration, lumbar region: Secondary | ICD-10-CM | POA: Insufficient documentation

## 2021-04-23 DIAGNOSIS — Z951 Presence of aortocoronary bypass graft: Secondary | ICD-10-CM | POA: Insufficient documentation

## 2021-04-23 DIAGNOSIS — D649 Anemia, unspecified: Secondary | ICD-10-CM | POA: Diagnosis not present

## 2021-04-23 DIAGNOSIS — Z96612 Presence of left artificial shoulder joint: Secondary | ICD-10-CM | POA: Diagnosis not present

## 2021-04-23 DIAGNOSIS — Z9884 Bariatric surgery status: Secondary | ICD-10-CM | POA: Insufficient documentation

## 2021-04-23 DIAGNOSIS — M159 Polyosteoarthritis, unspecified: Secondary | ICD-10-CM | POA: Insufficient documentation

## 2021-04-23 DIAGNOSIS — G894 Chronic pain syndrome: Secondary | ICD-10-CM | POA: Diagnosis not present

## 2021-04-23 DIAGNOSIS — I2510 Atherosclerotic heart disease of native coronary artery without angina pectoris: Secondary | ICD-10-CM | POA: Diagnosis not present

## 2021-04-23 DIAGNOSIS — Z1211 Encounter for screening for malignant neoplasm of colon: Secondary | ICD-10-CM | POA: Diagnosis not present

## 2021-04-23 DIAGNOSIS — M8949 Other hypertrophic osteoarthropathy, multiple sites: Secondary | ICD-10-CM | POA: Diagnosis not present

## 2021-04-23 DIAGNOSIS — E559 Vitamin D deficiency, unspecified: Secondary | ICD-10-CM | POA: Diagnosis not present

## 2021-04-23 DIAGNOSIS — M1000 Idiopathic gout, unspecified site: Secondary | ICD-10-CM | POA: Diagnosis not present

## 2021-04-25 DIAGNOSIS — R400 Somnolence: Secondary | ICD-10-CM | POA: Diagnosis not present

## 2021-04-25 DIAGNOSIS — Z743 Need for continuous supervision: Secondary | ICD-10-CM | POA: Diagnosis not present

## 2021-04-25 DIAGNOSIS — R4182 Altered mental status, unspecified: Secondary | ICD-10-CM | POA: Diagnosis not present

## 2021-04-25 DIAGNOSIS — R000 Tachycardia, unspecified: Secondary | ICD-10-CM | POA: Diagnosis not present

## 2021-04-25 DIAGNOSIS — Z0389 Encounter for observation for other suspected diseases and conditions ruled out: Secondary | ICD-10-CM | POA: Diagnosis not present

## 2021-04-25 DIAGNOSIS — T402X1A Poisoning by other opioids, accidental (unintentional), initial encounter: Secondary | ICD-10-CM | POA: Diagnosis not present

## 2021-04-26 DIAGNOSIS — I10 Essential (primary) hypertension: Secondary | ICD-10-CM | POA: Diagnosis not present

## 2021-04-26 DIAGNOSIS — D649 Anemia, unspecified: Secondary | ICD-10-CM | POA: Diagnosis not present

## 2021-04-26 DIAGNOSIS — I2510 Atherosclerotic heart disease of native coronary artery without angina pectoris: Secondary | ICD-10-CM | POA: Diagnosis not present

## 2021-04-26 DIAGNOSIS — Z86711 Personal history of pulmonary embolism: Secondary | ICD-10-CM | POA: Diagnosis not present

## 2021-04-26 DIAGNOSIS — R4182 Altered mental status, unspecified: Secondary | ICD-10-CM | POA: Diagnosis not present

## 2021-04-26 DIAGNOSIS — M1990 Unspecified osteoarthritis, unspecified site: Secondary | ICD-10-CM | POA: Diagnosis not present

## 2021-04-26 DIAGNOSIS — G9340 Encephalopathy, unspecified: Secondary | ICD-10-CM | POA: Diagnosis not present

## 2021-04-26 DIAGNOSIS — Z0389 Encounter for observation for other suspected diseases and conditions ruled out: Secondary | ICD-10-CM | POA: Diagnosis not present

## 2021-04-26 DIAGNOSIS — Z9189 Other specified personal risk factors, not elsewhere classified: Secondary | ICD-10-CM | POA: Diagnosis not present

## 2021-04-26 DIAGNOSIS — Z951 Presence of aortocoronary bypass graft: Secondary | ICD-10-CM | POA: Diagnosis not present

## 2021-04-27 DIAGNOSIS — Z951 Presence of aortocoronary bypass graft: Secondary | ICD-10-CM | POA: Diagnosis not present

## 2021-04-27 DIAGNOSIS — D649 Anemia, unspecified: Secondary | ICD-10-CM | POA: Diagnosis not present

## 2021-04-27 DIAGNOSIS — Z9189 Other specified personal risk factors, not elsewhere classified: Secondary | ICD-10-CM | POA: Diagnosis not present

## 2021-04-27 DIAGNOSIS — I2510 Atherosclerotic heart disease of native coronary artery without angina pectoris: Secondary | ICD-10-CM | POA: Diagnosis not present

## 2021-04-27 DIAGNOSIS — M1990 Unspecified osteoarthritis, unspecified site: Secondary | ICD-10-CM | POA: Diagnosis not present

## 2021-04-27 DIAGNOSIS — Z86711 Personal history of pulmonary embolism: Secondary | ICD-10-CM | POA: Diagnosis not present

## 2021-04-27 DIAGNOSIS — I10 Essential (primary) hypertension: Secondary | ICD-10-CM | POA: Diagnosis not present

## 2021-04-27 DIAGNOSIS — N390 Urinary tract infection, site not specified: Secondary | ICD-10-CM | POA: Diagnosis not present

## 2021-04-27 DIAGNOSIS — G9340 Encephalopathy, unspecified: Secondary | ICD-10-CM | POA: Diagnosis not present

## 2021-04-28 DIAGNOSIS — D649 Anemia, unspecified: Secondary | ICD-10-CM | POA: Diagnosis not present

## 2021-04-28 DIAGNOSIS — Z0389 Encounter for observation for other suspected diseases and conditions ruled out: Secondary | ICD-10-CM | POA: Diagnosis not present

## 2021-04-28 DIAGNOSIS — Z951 Presence of aortocoronary bypass graft: Secondary | ICD-10-CM | POA: Diagnosis not present

## 2021-04-28 DIAGNOSIS — G928 Other toxic encephalopathy: Secondary | ICD-10-CM | POA: Diagnosis not present

## 2021-04-28 DIAGNOSIS — T402X1A Poisoning by other opioids, accidental (unintentional), initial encounter: Secondary | ICD-10-CM | POA: Diagnosis not present

## 2021-04-28 DIAGNOSIS — G9341 Metabolic encephalopathy: Secondary | ICD-10-CM | POA: Diagnosis not present

## 2021-04-28 DIAGNOSIS — N390 Urinary tract infection, site not specified: Secondary | ICD-10-CM | POA: Diagnosis not present

## 2021-04-28 DIAGNOSIS — I10 Essential (primary) hypertension: Secondary | ICD-10-CM | POA: Diagnosis not present

## 2021-04-28 DIAGNOSIS — M1990 Unspecified osteoarthritis, unspecified site: Secondary | ICD-10-CM | POA: Diagnosis not present

## 2021-04-28 DIAGNOSIS — Z96653 Presence of artificial knee joint, bilateral: Secondary | ICD-10-CM | POA: Diagnosis not present

## 2021-04-28 DIAGNOSIS — E876 Hypokalemia: Secondary | ICD-10-CM | POA: Diagnosis not present

## 2021-04-28 DIAGNOSIS — Z7901 Long term (current) use of anticoagulants: Secondary | ICD-10-CM | POA: Diagnosis not present

## 2021-04-28 DIAGNOSIS — I2510 Atherosclerotic heart disease of native coronary artery without angina pectoris: Secondary | ICD-10-CM | POA: Diagnosis not present

## 2021-04-28 DIAGNOSIS — G8929 Other chronic pain: Secondary | ICD-10-CM | POA: Diagnosis not present

## 2021-04-28 DIAGNOSIS — Z86711 Personal history of pulmonary embolism: Secondary | ICD-10-CM | POA: Diagnosis not present

## 2021-04-28 DIAGNOSIS — B965 Pseudomonas (aeruginosa) (mallei) (pseudomallei) as the cause of diseases classified elsewhere: Secondary | ICD-10-CM | POA: Diagnosis not present

## 2021-04-28 DIAGNOSIS — Z9884 Bariatric surgery status: Secondary | ICD-10-CM | POA: Diagnosis not present

## 2021-04-28 DIAGNOSIS — F1120 Opioid dependence, uncomplicated: Secondary | ICD-10-CM | POA: Diagnosis not present

## 2021-04-28 DIAGNOSIS — Z96612 Presence of left artificial shoulder joint: Secondary | ICD-10-CM | POA: Diagnosis not present

## 2021-04-28 DIAGNOSIS — R531 Weakness: Secondary | ICD-10-CM | POA: Diagnosis not present

## 2021-04-28 DIAGNOSIS — Z9189 Other specified personal risk factors, not elsewhere classified: Secondary | ICD-10-CM | POA: Diagnosis not present

## 2021-04-28 DIAGNOSIS — D638 Anemia in other chronic diseases classified elsewhere: Secondary | ICD-10-CM | POA: Diagnosis not present

## 2021-04-29 DIAGNOSIS — T402X1A Poisoning by other opioids, accidental (unintentional), initial encounter: Secondary | ICD-10-CM | POA: Diagnosis not present

## 2021-04-29 DIAGNOSIS — N390 Urinary tract infection, site not specified: Secondary | ICD-10-CM | POA: Diagnosis not present

## 2021-04-29 DIAGNOSIS — Z9189 Other specified personal risk factors, not elsewhere classified: Secondary | ICD-10-CM | POA: Diagnosis not present

## 2021-04-29 DIAGNOSIS — D649 Anemia, unspecified: Secondary | ICD-10-CM | POA: Diagnosis not present

## 2021-04-29 DIAGNOSIS — I2510 Atherosclerotic heart disease of native coronary artery without angina pectoris: Secondary | ICD-10-CM | POA: Diagnosis not present

## 2021-04-29 DIAGNOSIS — E876 Hypokalemia: Secondary | ICD-10-CM | POA: Diagnosis not present

## 2021-04-29 DIAGNOSIS — Z951 Presence of aortocoronary bypass graft: Secondary | ICD-10-CM | POA: Diagnosis not present

## 2021-04-29 DIAGNOSIS — M1990 Unspecified osteoarthritis, unspecified site: Secondary | ICD-10-CM | POA: Diagnosis not present

## 2021-04-29 DIAGNOSIS — I10 Essential (primary) hypertension: Secondary | ICD-10-CM | POA: Diagnosis not present

## 2021-04-29 DIAGNOSIS — B965 Pseudomonas (aeruginosa) (mallei) (pseudomallei) as the cause of diseases classified elsewhere: Secondary | ICD-10-CM | POA: Diagnosis not present

## 2021-04-29 DIAGNOSIS — G9341 Metabolic encephalopathy: Secondary | ICD-10-CM | POA: Diagnosis not present

## 2021-05-10 DIAGNOSIS — Z951 Presence of aortocoronary bypass graft: Secondary | ICD-10-CM | POA: Diagnosis not present

## 2021-05-10 DIAGNOSIS — E782 Mixed hyperlipidemia: Secondary | ICD-10-CM | POA: Diagnosis not present

## 2021-05-10 DIAGNOSIS — G894 Chronic pain syndrome: Secondary | ICD-10-CM | POA: Diagnosis not present

## 2021-05-10 DIAGNOSIS — M5136 Other intervertebral disc degeneration, lumbar region: Secondary | ICD-10-CM | POA: Diagnosis not present

## 2021-05-10 DIAGNOSIS — I10 Essential (primary) hypertension: Secondary | ICD-10-CM | POA: Diagnosis not present

## 2021-05-10 DIAGNOSIS — I119 Hypertensive heart disease without heart failure: Secondary | ICD-10-CM | POA: Diagnosis not present

## 2021-05-11 DIAGNOSIS — Z79899 Other long term (current) drug therapy: Secondary | ICD-10-CM | POA: Diagnosis not present

## 2021-05-11 DIAGNOSIS — M5136 Other intervertebral disc degeneration, lumbar region: Secondary | ICD-10-CM | POA: Diagnosis not present

## 2021-05-11 DIAGNOSIS — F1120 Opioid dependence, uncomplicated: Secondary | ICD-10-CM | POA: Diagnosis not present

## 2021-05-13 DIAGNOSIS — Z7901 Long term (current) use of anticoagulants: Secondary | ICD-10-CM | POA: Diagnosis not present

## 2021-05-13 DIAGNOSIS — I10 Essential (primary) hypertension: Secondary | ICD-10-CM | POA: Diagnosis not present

## 2021-05-13 DIAGNOSIS — F1124 Opioid dependence with opioid-induced mood disorder: Secondary | ICD-10-CM | POA: Diagnosis not present

## 2021-05-13 DIAGNOSIS — Z79899 Other long term (current) drug therapy: Secondary | ICD-10-CM | POA: Diagnosis not present

## 2021-05-13 DIAGNOSIS — R600 Localized edema: Secondary | ICD-10-CM | POA: Diagnosis not present

## 2021-05-13 DIAGNOSIS — R456 Violent behavior: Secondary | ICD-10-CM | POA: Diagnosis not present

## 2021-05-13 DIAGNOSIS — Z9989 Dependence on other enabling machines and devices: Secondary | ICD-10-CM | POA: Diagnosis not present

## 2021-05-13 DIAGNOSIS — Z20822 Contact with and (suspected) exposure to covid-19: Secondary | ICD-10-CM | POA: Diagnosis not present

## 2021-05-13 DIAGNOSIS — R402412 Glasgow coma scale score 13-15, at arrival to emergency department: Secondary | ICD-10-CM | POA: Diagnosis not present

## 2021-05-13 DIAGNOSIS — M25572 Pain in left ankle and joints of left foot: Secondary | ICD-10-CM | POA: Diagnosis not present

## 2021-05-13 DIAGNOSIS — R000 Tachycardia, unspecified: Secondary | ICD-10-CM | POA: Diagnosis not present

## 2021-05-13 DIAGNOSIS — F1110 Opioid abuse, uncomplicated: Secondary | ICD-10-CM | POA: Diagnosis not present

## 2021-05-15 DIAGNOSIS — M109 Gout, unspecified: Secondary | ICD-10-CM | POA: Diagnosis not present

## 2021-05-15 DIAGNOSIS — S40812A Abrasion of left upper arm, initial encounter: Secondary | ICD-10-CM | POA: Diagnosis not present

## 2021-05-15 DIAGNOSIS — S0003XA Contusion of scalp, initial encounter: Secondary | ICD-10-CM | POA: Diagnosis not present

## 2021-05-15 DIAGNOSIS — S51812A Laceration without foreign body of left forearm, initial encounter: Secondary | ICD-10-CM | POA: Diagnosis not present

## 2021-05-15 DIAGNOSIS — S80811A Abrasion, right lower leg, initial encounter: Secondary | ICD-10-CM | POA: Diagnosis not present

## 2021-05-15 DIAGNOSIS — Z7901 Long term (current) use of anticoagulants: Secondary | ICD-10-CM | POA: Diagnosis not present

## 2021-05-15 DIAGNOSIS — S40811A Abrasion of right upper arm, initial encounter: Secondary | ICD-10-CM | POA: Diagnosis not present

## 2021-05-15 DIAGNOSIS — R441 Visual hallucinations: Secondary | ICD-10-CM | POA: Diagnosis not present

## 2021-05-15 DIAGNOSIS — R4182 Altered mental status, unspecified: Secondary | ICD-10-CM | POA: Diagnosis not present

## 2021-05-15 DIAGNOSIS — Z23 Encounter for immunization: Secondary | ICD-10-CM | POA: Diagnosis not present

## 2021-05-15 DIAGNOSIS — I10 Essential (primary) hypertension: Secondary | ICD-10-CM | POA: Diagnosis not present

## 2021-05-15 DIAGNOSIS — Z79899 Other long term (current) drug therapy: Secondary | ICD-10-CM | POA: Diagnosis not present

## 2021-05-15 DIAGNOSIS — Z20822 Contact with and (suspected) exposure to covid-19: Secondary | ICD-10-CM | POA: Diagnosis not present

## 2021-05-16 DIAGNOSIS — Z743 Need for continuous supervision: Secondary | ICD-10-CM | POA: Diagnosis not present

## 2021-05-16 DIAGNOSIS — R4182 Altered mental status, unspecified: Secondary | ICD-10-CM | POA: Diagnosis not present

## 2021-05-16 DIAGNOSIS — F989 Unspecified behavioral and emotional disorders with onset usually occurring in childhood and adolescence: Secondary | ICD-10-CM | POA: Diagnosis not present

## 2021-05-16 DIAGNOSIS — E785 Hyperlipidemia, unspecified: Secondary | ICD-10-CM | POA: Diagnosis not present

## 2021-05-16 DIAGNOSIS — I10 Essential (primary) hypertension: Secondary | ICD-10-CM | POA: Diagnosis not present

## 2021-05-16 DIAGNOSIS — F1910 Other psychoactive substance abuse, uncomplicated: Secondary | ICD-10-CM | POA: Diagnosis not present

## 2021-05-16 DIAGNOSIS — S40811A Abrasion of right upper arm, initial encounter: Secondary | ICD-10-CM | POA: Diagnosis not present

## 2021-05-16 DIAGNOSIS — R443 Hallucinations, unspecified: Secondary | ICD-10-CM | POA: Diagnosis not present

## 2021-05-16 DIAGNOSIS — Z86718 Personal history of other venous thrombosis and embolism: Secondary | ICD-10-CM | POA: Diagnosis not present

## 2021-05-16 DIAGNOSIS — N189 Chronic kidney disease, unspecified: Secondary | ICD-10-CM | POA: Diagnosis not present

## 2021-05-16 DIAGNOSIS — L89621 Pressure ulcer of left heel, stage 1: Secondary | ICD-10-CM | POA: Diagnosis not present

## 2021-05-16 DIAGNOSIS — F1510 Other stimulant abuse, uncomplicated: Secondary | ICD-10-CM | POA: Diagnosis not present

## 2021-05-16 DIAGNOSIS — F323 Major depressive disorder, single episode, severe with psychotic features: Secondary | ICD-10-CM | POA: Diagnosis not present

## 2021-05-16 DIAGNOSIS — F1010 Alcohol abuse, uncomplicated: Secondary | ICD-10-CM | POA: Diagnosis not present

## 2021-05-16 DIAGNOSIS — S4992XA Unspecified injury of left shoulder and upper arm, initial encounter: Secondary | ICD-10-CM | POA: Diagnosis not present

## 2021-05-16 DIAGNOSIS — L853 Xerosis cutis: Secondary | ICD-10-CM | POA: Diagnosis not present

## 2021-05-16 DIAGNOSIS — T148XXA Other injury of unspecified body region, initial encounter: Secondary | ICD-10-CM | POA: Diagnosis not present

## 2021-05-16 DIAGNOSIS — S40812A Abrasion of left upper arm, initial encounter: Secondary | ICD-10-CM | POA: Diagnosis not present

## 2021-05-16 DIAGNOSIS — Z86711 Personal history of pulmonary embolism: Secondary | ICD-10-CM | POA: Diagnosis not present

## 2021-05-16 DIAGNOSIS — R110 Nausea: Secondary | ICD-10-CM | POA: Diagnosis not present

## 2021-05-16 DIAGNOSIS — Z20822 Contact with and (suspected) exposure to covid-19: Secondary | ICD-10-CM | POA: Diagnosis not present

## 2021-05-16 DIAGNOSIS — Z9884 Bariatric surgery status: Secondary | ICD-10-CM | POA: Diagnosis not present

## 2021-05-16 DIAGNOSIS — E872 Acidosis: Secondary | ICD-10-CM | POA: Diagnosis not present

## 2021-05-16 DIAGNOSIS — M109 Gout, unspecified: Secondary | ICD-10-CM | POA: Diagnosis not present

## 2021-05-16 DIAGNOSIS — T84028A Dislocation of other internal joint prosthesis, initial encounter: Secondary | ICD-10-CM | POA: Diagnosis not present

## 2021-05-16 DIAGNOSIS — F32A Depression, unspecified: Secondary | ICD-10-CM | POA: Diagnosis not present

## 2021-05-16 DIAGNOSIS — Z96612 Presence of left artificial shoulder joint: Secondary | ICD-10-CM | POA: Diagnosis not present

## 2021-05-16 DIAGNOSIS — D649 Anemia, unspecified: Secondary | ICD-10-CM | POA: Diagnosis not present

## 2021-05-16 DIAGNOSIS — Z0289 Encounter for other administrative examinations: Secondary | ICD-10-CM | POA: Diagnosis not present

## 2021-05-16 DIAGNOSIS — I2510 Atherosclerotic heart disease of native coronary artery without angina pectoris: Secondary | ICD-10-CM | POA: Diagnosis not present

## 2021-05-16 DIAGNOSIS — Z951 Presence of aortocoronary bypass graft: Secondary | ICD-10-CM | POA: Diagnosis not present

## 2021-05-16 DIAGNOSIS — Z9151 Personal history of suicidal behavior: Secondary | ICD-10-CM | POA: Diagnosis not present

## 2021-05-16 DIAGNOSIS — I129 Hypertensive chronic kidney disease with stage 1 through stage 4 chronic kidney disease, or unspecified chronic kidney disease: Secondary | ICD-10-CM | POA: Diagnosis not present

## 2021-05-16 DIAGNOSIS — N179 Acute kidney failure, unspecified: Secondary | ICD-10-CM | POA: Diagnosis not present

## 2021-05-16 DIAGNOSIS — Z885 Allergy status to narcotic agent status: Secondary | ICD-10-CM | POA: Diagnosis not present

## 2021-05-16 DIAGNOSIS — L89611 Pressure ulcer of right heel, stage 1: Secondary | ICD-10-CM | POA: Diagnosis not present

## 2021-05-17 DIAGNOSIS — Z86711 Personal history of pulmonary embolism: Secondary | ICD-10-CM | POA: Diagnosis not present

## 2021-05-17 DIAGNOSIS — F1519 Other stimulant abuse with unspecified stimulant-induced disorder: Secondary | ICD-10-CM | POA: Diagnosis not present

## 2021-05-17 DIAGNOSIS — F32A Depression, unspecified: Secondary | ICD-10-CM | POA: Diagnosis not present

## 2021-05-17 DIAGNOSIS — N179 Acute kidney failure, unspecified: Secondary | ICD-10-CM | POA: Diagnosis not present

## 2021-05-17 DIAGNOSIS — S40812A Abrasion of left upper arm, initial encounter: Secondary | ICD-10-CM | POA: Diagnosis not present

## 2021-05-17 DIAGNOSIS — Z8679 Personal history of other diseases of the circulatory system: Secondary | ICD-10-CM | POA: Insufficient documentation

## 2021-05-17 DIAGNOSIS — I2510 Atherosclerotic heart disease of native coronary artery without angina pectoris: Secondary | ICD-10-CM | POA: Diagnosis not present

## 2021-05-17 DIAGNOSIS — I10 Essential (primary) hypertension: Secondary | ICD-10-CM | POA: Diagnosis not present

## 2021-05-17 DIAGNOSIS — N1832 Chronic kidney disease, stage 3b: Secondary | ICD-10-CM | POA: Diagnosis not present

## 2021-05-17 DIAGNOSIS — L89621 Pressure ulcer of left heel, stage 1: Secondary | ICD-10-CM | POA: Diagnosis not present

## 2021-05-17 DIAGNOSIS — S40811A Abrasion of right upper arm, initial encounter: Secondary | ICD-10-CM | POA: Diagnosis not present

## 2021-05-17 DIAGNOSIS — T84028A Dislocation of other internal joint prosthesis, initial encounter: Secondary | ICD-10-CM | POA: Diagnosis not present

## 2021-05-17 DIAGNOSIS — Z20822 Contact with and (suspected) exposure to covid-19: Secondary | ICD-10-CM | POA: Diagnosis not present

## 2021-05-17 DIAGNOSIS — E872 Acidosis: Secondary | ICD-10-CM | POA: Diagnosis not present

## 2021-05-17 DIAGNOSIS — D649 Anemia, unspecified: Secondary | ICD-10-CM | POA: Diagnosis not present

## 2021-05-17 DIAGNOSIS — Z951 Presence of aortocoronary bypass graft: Secondary | ICD-10-CM | POA: Diagnosis not present

## 2021-05-17 DIAGNOSIS — L89611 Pressure ulcer of right heel, stage 1: Secondary | ICD-10-CM | POA: Diagnosis not present

## 2021-05-17 DIAGNOSIS — I120 Hypertensive chronic kidney disease with stage 5 chronic kidney disease or end stage renal disease: Secondary | ICD-10-CM | POA: Diagnosis not present

## 2021-05-17 DIAGNOSIS — F1910 Other psychoactive substance abuse, uncomplicated: Secondary | ICD-10-CM | POA: Diagnosis not present

## 2021-05-17 DIAGNOSIS — R110 Nausea: Secondary | ICD-10-CM | POA: Diagnosis not present

## 2021-05-17 DIAGNOSIS — L853 Xerosis cutis: Secondary | ICD-10-CM | POA: Diagnosis not present

## 2021-05-17 DIAGNOSIS — F1990 Other psychoactive substance use, unspecified, uncomplicated: Secondary | ICD-10-CM | POA: Insufficient documentation

## 2021-05-18 DIAGNOSIS — L89611 Pressure ulcer of right heel, stage 1: Secondary | ICD-10-CM | POA: Insufficient documentation

## 2021-05-31 DIAGNOSIS — N289 Disorder of kidney and ureter, unspecified: Secondary | ICD-10-CM | POA: Diagnosis not present

## 2021-05-31 DIAGNOSIS — M25512 Pain in left shoulder: Secondary | ICD-10-CM | POA: Diagnosis not present

## 2021-06-05 DIAGNOSIS — F1010 Alcohol abuse, uncomplicated: Secondary | ICD-10-CM | POA: Diagnosis not present

## 2021-06-05 DIAGNOSIS — F338 Other recurrent depressive disorders: Secondary | ICD-10-CM | POA: Diagnosis not present

## 2021-06-05 DIAGNOSIS — F1510 Other stimulant abuse, uncomplicated: Secondary | ICD-10-CM | POA: Diagnosis not present

## 2021-06-05 DIAGNOSIS — F1110 Opioid abuse, uncomplicated: Secondary | ICD-10-CM | POA: Diagnosis not present

## 2021-06-05 DIAGNOSIS — R410 Disorientation, unspecified: Secondary | ICD-10-CM | POA: Diagnosis not present

## 2021-06-05 DIAGNOSIS — E869 Volume depletion, unspecified: Secondary | ICD-10-CM | POA: Diagnosis not present

## 2021-06-05 DIAGNOSIS — F1011 Alcohol abuse, in remission: Secondary | ICD-10-CM | POA: Diagnosis not present

## 2021-06-05 DIAGNOSIS — I10 Essential (primary) hypertension: Secondary | ICD-10-CM | POA: Diagnosis not present

## 2021-06-05 DIAGNOSIS — G894 Chronic pain syndrome: Secondary | ICD-10-CM | POA: Diagnosis not present

## 2021-06-05 DIAGNOSIS — F1910 Other psychoactive substance abuse, uncomplicated: Secondary | ICD-10-CM | POA: Diagnosis not present

## 2021-06-05 DIAGNOSIS — S43005A Unspecified dislocation of left shoulder joint, initial encounter: Secondary | ICD-10-CM | POA: Diagnosis not present

## 2021-06-05 DIAGNOSIS — M25519 Pain in unspecified shoulder: Secondary | ICD-10-CM | POA: Diagnosis not present

## 2021-06-05 DIAGNOSIS — M25512 Pain in left shoulder: Secondary | ICD-10-CM | POA: Diagnosis not present

## 2021-06-05 DIAGNOSIS — T4271XA Poisoning by unspecified antiepileptic and sedative-hypnotic drugs, accidental (unintentional), initial encounter: Secondary | ICD-10-CM | POA: Diagnosis not present

## 2021-06-05 DIAGNOSIS — Z87891 Personal history of nicotine dependence: Secondary | ICD-10-CM | POA: Diagnosis not present

## 2021-06-05 DIAGNOSIS — F332 Major depressive disorder, recurrent severe without psychotic features: Secondary | ICD-10-CM | POA: Diagnosis not present

## 2021-06-05 DIAGNOSIS — F1111 Opioid abuse, in remission: Secondary | ICD-10-CM | POA: Diagnosis not present

## 2021-06-05 DIAGNOSIS — E785 Hyperlipidemia, unspecified: Secondary | ICD-10-CM | POA: Diagnosis not present

## 2021-06-05 DIAGNOSIS — R440 Auditory hallucinations: Secondary | ICD-10-CM | POA: Diagnosis not present

## 2021-06-05 DIAGNOSIS — Z79899 Other long term (current) drug therapy: Secondary | ICD-10-CM | POA: Diagnosis not present

## 2021-06-05 DIAGNOSIS — Z951 Presence of aortocoronary bypass graft: Secondary | ICD-10-CM | POA: Diagnosis not present

## 2021-06-05 DIAGNOSIS — I2510 Atherosclerotic heart disease of native coronary artery without angina pectoris: Secondary | ICD-10-CM | POA: Diagnosis not present

## 2021-06-05 DIAGNOSIS — T426X2A Poisoning by other antiepileptic and sedative-hypnotic drugs, intentional self-harm, initial encounter: Secondary | ICD-10-CM | POA: Diagnosis not present

## 2021-06-05 DIAGNOSIS — Z743 Need for continuous supervision: Secondary | ICD-10-CM | POA: Diagnosis not present

## 2021-06-05 DIAGNOSIS — Z20822 Contact with and (suspected) exposure to covid-19: Secondary | ICD-10-CM | POA: Diagnosis not present

## 2021-06-05 DIAGNOSIS — Z7901 Long term (current) use of anticoagulants: Secondary | ICD-10-CM | POA: Diagnosis not present

## 2021-06-06 DIAGNOSIS — F332 Major depressive disorder, recurrent severe without psychotic features: Secondary | ICD-10-CM | POA: Insufficient documentation

## 2021-06-15 DIAGNOSIS — S43005A Unspecified dislocation of left shoulder joint, initial encounter: Secondary | ICD-10-CM | POA: Diagnosis not present

## 2021-06-15 DIAGNOSIS — M25512 Pain in left shoulder: Secondary | ICD-10-CM | POA: Diagnosis not present

## 2021-06-16 DIAGNOSIS — M25512 Pain in left shoulder: Secondary | ICD-10-CM | POA: Diagnosis not present

## 2021-07-02 DIAGNOSIS — Z8679 Personal history of other diseases of the circulatory system: Secondary | ICD-10-CM | POA: Diagnosis not present

## 2021-07-02 DIAGNOSIS — I2510 Atherosclerotic heart disease of native coronary artery without angina pectoris: Secondary | ICD-10-CM | POA: Diagnosis not present

## 2021-07-02 DIAGNOSIS — I351 Nonrheumatic aortic (valve) insufficiency: Secondary | ICD-10-CM | POA: Diagnosis not present

## 2021-07-02 DIAGNOSIS — I2699 Other pulmonary embolism without acute cor pulmonale: Secondary | ICD-10-CM | POA: Diagnosis not present

## 2021-07-02 DIAGNOSIS — Z86711 Personal history of pulmonary embolism: Secondary | ICD-10-CM | POA: Diagnosis not present

## 2021-07-02 DIAGNOSIS — I119 Hypertensive heart disease without heart failure: Secondary | ICD-10-CM | POA: Diagnosis not present

## 2021-07-02 DIAGNOSIS — Z951 Presence of aortocoronary bypass graft: Secondary | ICD-10-CM | POA: Diagnosis not present

## 2021-07-02 DIAGNOSIS — E782 Mixed hyperlipidemia: Secondary | ICD-10-CM | POA: Diagnosis not present

## 2021-07-02 DIAGNOSIS — R0600 Dyspnea, unspecified: Secondary | ICD-10-CM | POA: Diagnosis not present

## 2021-07-07 ENCOUNTER — Telehealth: Payer: Self-pay

## 2021-07-07 DIAGNOSIS — I5020 Unspecified systolic (congestive) heart failure: Secondary | ICD-10-CM | POA: Diagnosis not present

## 2021-07-07 DIAGNOSIS — R0602 Shortness of breath: Secondary | ICD-10-CM | POA: Diagnosis not present

## 2021-07-07 DIAGNOSIS — Z86711 Personal history of pulmonary embolism: Secondary | ICD-10-CM | POA: Diagnosis not present

## 2021-07-07 DIAGNOSIS — I2699 Other pulmonary embolism without acute cor pulmonale: Secondary | ICD-10-CM | POA: Diagnosis not present

## 2021-07-19 DIAGNOSIS — M25512 Pain in left shoulder: Secondary | ICD-10-CM | POA: Diagnosis not present

## 2021-07-22 DIAGNOSIS — M19012 Primary osteoarthritis, left shoulder: Secondary | ICD-10-CM | POA: Diagnosis not present

## 2021-07-22 DIAGNOSIS — S4292XA Fracture of left shoulder girdle, part unspecified, initial encounter for closed fracture: Secondary | ICD-10-CM | POA: Diagnosis not present

## 2021-07-22 DIAGNOSIS — E869 Volume depletion, unspecified: Secondary | ICD-10-CM | POA: Diagnosis not present

## 2021-07-22 DIAGNOSIS — Z7901 Long term (current) use of anticoagulants: Secondary | ICD-10-CM | POA: Diagnosis not present

## 2021-07-22 DIAGNOSIS — L02412 Cutaneous abscess of left axilla: Secondary | ICD-10-CM | POA: Diagnosis not present

## 2021-07-22 DIAGNOSIS — Z951 Presence of aortocoronary bypass graft: Secondary | ICD-10-CM | POA: Diagnosis not present

## 2021-07-22 DIAGNOSIS — Z96612 Presence of left artificial shoulder joint: Secondary | ICD-10-CM | POA: Diagnosis not present

## 2021-07-22 DIAGNOSIS — S43015A Anterior dislocation of left humerus, initial encounter: Secondary | ICD-10-CM | POA: Diagnosis not present

## 2021-07-22 DIAGNOSIS — M109 Gout, unspecified: Secondary | ICD-10-CM | POA: Diagnosis not present

## 2021-07-22 DIAGNOSIS — Z79899 Other long term (current) drug therapy: Secondary | ICD-10-CM | POA: Diagnosis not present

## 2021-07-22 DIAGNOSIS — I10 Essential (primary) hypertension: Secondary | ICD-10-CM | POA: Diagnosis not present

## 2021-07-28 DIAGNOSIS — M25512 Pain in left shoulder: Secondary | ICD-10-CM | POA: Diagnosis not present

## 2021-08-04 DIAGNOSIS — Z79899 Other long term (current) drug therapy: Secondary | ICD-10-CM | POA: Diagnosis not present

## 2021-08-04 DIAGNOSIS — M5136 Other intervertebral disc degeneration, lumbar region: Secondary | ICD-10-CM | POA: Diagnosis not present

## 2021-08-04 DIAGNOSIS — M8949 Other hypertrophic osteoarthropathy, multiple sites: Secondary | ICD-10-CM | POA: Diagnosis not present

## 2021-08-04 DIAGNOSIS — F1120 Opioid dependence, uncomplicated: Secondary | ICD-10-CM | POA: Diagnosis not present

## 2021-08-11 DIAGNOSIS — M8949 Other hypertrophic osteoarthropathy, multiple sites: Secondary | ICD-10-CM | POA: Diagnosis not present

## 2021-08-11 DIAGNOSIS — M5136 Other intervertebral disc degeneration, lumbar region: Secondary | ICD-10-CM | POA: Diagnosis not present

## 2021-08-11 DIAGNOSIS — F1120 Opioid dependence, uncomplicated: Secondary | ICD-10-CM | POA: Diagnosis not present

## 2021-08-11 DIAGNOSIS — Z79899 Other long term (current) drug therapy: Secondary | ICD-10-CM | POA: Diagnosis not present

## 2021-08-18 DIAGNOSIS — M25512 Pain in left shoulder: Secondary | ICD-10-CM | POA: Diagnosis not present

## 2021-09-27 DIAGNOSIS — F411 Generalized anxiety disorder: Secondary | ICD-10-CM | POA: Insufficient documentation

## 2021-11-22 DIAGNOSIS — Z5900 Homelessness unspecified: Secondary | ICD-10-CM | POA: Insufficient documentation

## 2021-11-22 DIAGNOSIS — F1120 Opioid dependence, uncomplicated: Secondary | ICD-10-CM | POA: Insufficient documentation

## 2021-12-31 DIAGNOSIS — F10939 Alcohol use, unspecified with withdrawal, unspecified: Secondary | ICD-10-CM | POA: Insufficient documentation

## 2022-01-15 ENCOUNTER — Inpatient Hospital Stay (HOSPITAL_COMMUNITY)
Admission: EM | Admit: 2022-01-15 | Discharge: 2022-03-08 | DRG: 495 | Disposition: A | Discharge status: Skilled Nursing Facility | Payer: Medicare Other | Attending: Internal Medicine | Admitting: Internal Medicine

## 2022-01-15 ENCOUNTER — Encounter (HOSPITAL_COMMUNITY): Payer: Self-pay

## 2022-01-15 ENCOUNTER — Emergency Department (HOSPITAL_COMMUNITY): Payer: Medicare Other

## 2022-01-15 DIAGNOSIS — Z9884 Bariatric surgery status: Secondary | ICD-10-CM

## 2022-01-15 DIAGNOSIS — F03A4 Unspecified dementia, mild, with anxiety: Secondary | ICD-10-CM | POA: Diagnosis present

## 2022-01-15 DIAGNOSIS — M24412 Recurrent dislocation, left shoulder: Secondary | ICD-10-CM

## 2022-01-15 DIAGNOSIS — M25512 Pain in left shoulder: Secondary | ICD-10-CM

## 2022-01-15 DIAGNOSIS — I509 Heart failure, unspecified: Secondary | ICD-10-CM | POA: Diagnosis not present

## 2022-01-15 DIAGNOSIS — I483 Typical atrial flutter: Secondary | ICD-10-CM | POA: Diagnosis present

## 2022-01-15 DIAGNOSIS — T501X6A Underdosing of loop [high-ceiling] diuretics, initial encounter: Secondary | ICD-10-CM | POA: Diagnosis present

## 2022-01-15 DIAGNOSIS — Z87442 Personal history of urinary calculi: Secondary | ICD-10-CM

## 2022-01-15 DIAGNOSIS — R7989 Other specified abnormal findings of blood chemistry: Secondary | ICD-10-CM

## 2022-01-15 DIAGNOSIS — I453 Trifascicular block: Secondary | ICD-10-CM | POA: Diagnosis present

## 2022-01-15 DIAGNOSIS — F03A18 Unspecified dementia, mild, with other behavioral disturbance: Secondary | ICD-10-CM | POA: Diagnosis present

## 2022-01-15 DIAGNOSIS — Z8249 Family history of ischemic heart disease and other diseases of the circulatory system: Secondary | ICD-10-CM

## 2022-01-15 DIAGNOSIS — I4892 Unspecified atrial flutter: Secondary | ICD-10-CM | POA: Diagnosis not present

## 2022-01-15 DIAGNOSIS — I5041 Acute combined systolic (congestive) and diastolic (congestive) heart failure: Secondary | ICD-10-CM

## 2022-01-15 DIAGNOSIS — S40012A Contusion of left shoulder, initial encounter: Secondary | ICD-10-CM

## 2022-01-15 DIAGNOSIS — S41002A Unspecified open wound of left shoulder, initial encounter: Secondary | ICD-10-CM | POA: Diagnosis not present

## 2022-01-15 DIAGNOSIS — E538 Deficiency of other specified B group vitamins: Secondary | ICD-10-CM | POA: Diagnosis not present

## 2022-01-15 DIAGNOSIS — I5042 Chronic combined systolic (congestive) and diastolic (congestive) heart failure: Secondary | ICD-10-CM | POA: Diagnosis present

## 2022-01-15 DIAGNOSIS — S20212A Contusion of left front wall of thorax, initial encounter: Secondary | ICD-10-CM | POA: Diagnosis present

## 2022-01-15 DIAGNOSIS — T8450XA Infection and inflammatory reaction due to unspecified internal joint prosthesis, initial encounter: Secondary | ICD-10-CM | POA: Diagnosis not present

## 2022-01-15 DIAGNOSIS — I5022 Chronic systolic (congestive) heart failure: Secondary | ICD-10-CM | POA: Diagnosis not present

## 2022-01-15 DIAGNOSIS — Y792 Prosthetic and other implants, materials and accessory orthopedic devices associated with adverse incidents: Secondary | ICD-10-CM | POA: Diagnosis present

## 2022-01-15 DIAGNOSIS — E7800 Pure hypercholesterolemia, unspecified: Secondary | ICD-10-CM | POA: Diagnosis present

## 2022-01-15 DIAGNOSIS — I959 Hypotension, unspecified: Secondary | ICD-10-CM

## 2022-01-15 DIAGNOSIS — T8459XA Infection and inflammatory reaction due to other internal joint prosthesis, initial encounter: Principal | ICD-10-CM | POA: Diagnosis present

## 2022-01-15 DIAGNOSIS — E669 Obesity, unspecified: Secondary | ICD-10-CM | POA: Diagnosis present

## 2022-01-15 DIAGNOSIS — E43 Unspecified severe protein-calorie malnutrition: Secondary | ICD-10-CM | POA: Diagnosis present

## 2022-01-15 DIAGNOSIS — N179 Acute kidney failure, unspecified: Secondary | ICD-10-CM | POA: Diagnosis present

## 2022-01-15 DIAGNOSIS — D649 Anemia, unspecified: Secondary | ICD-10-CM

## 2022-01-15 DIAGNOSIS — R0789 Other chest pain: Secondary | ICD-10-CM | POA: Diagnosis present

## 2022-01-15 DIAGNOSIS — Z96612 Presence of left artificial shoulder joint: Secondary | ICD-10-CM | POA: Diagnosis present

## 2022-01-15 DIAGNOSIS — Z86711 Personal history of pulmonary embolism: Secondary | ICD-10-CM | POA: Diagnosis present

## 2022-01-15 DIAGNOSIS — D500 Iron deficiency anemia secondary to blood loss (chronic): Secondary | ICD-10-CM | POA: Diagnosis present

## 2022-01-15 DIAGNOSIS — T84028A Dislocation of other internal joint prosthesis, initial encounter: Secondary | ICD-10-CM | POA: Diagnosis present

## 2022-01-15 DIAGNOSIS — Z96653 Presence of artificial knee joint, bilateral: Secondary | ICD-10-CM | POA: Diagnosis present

## 2022-01-15 DIAGNOSIS — Y838 Other surgical procedures as the cause of abnormal reaction of the patient, or of later complication, without mention of misadventure at the time of the procedure: Secondary | ICD-10-CM | POA: Diagnosis present

## 2022-01-15 DIAGNOSIS — K5900 Constipation, unspecified: Secondary | ICD-10-CM | POA: Diagnosis not present

## 2022-01-15 DIAGNOSIS — I2510 Atherosclerotic heart disease of native coronary artery without angina pectoris: Secondary | ICD-10-CM | POA: Diagnosis present

## 2022-01-15 DIAGNOSIS — Z634 Disappearance and death of family member: Secondary | ICD-10-CM

## 2022-01-15 DIAGNOSIS — Z95828 Presence of other vascular implants and grafts: Secondary | ICD-10-CM

## 2022-01-15 DIAGNOSIS — Z7982 Long term (current) use of aspirin: Secondary | ICD-10-CM

## 2022-01-15 DIAGNOSIS — F1111 Opioid abuse, in remission: Secondary | ICD-10-CM | POA: Diagnosis not present

## 2022-01-15 DIAGNOSIS — M4800 Spinal stenosis, site unspecified: Secondary | ICD-10-CM | POA: Diagnosis present

## 2022-01-15 DIAGNOSIS — G894 Chronic pain syndrome: Secondary | ICD-10-CM | POA: Diagnosis not present

## 2022-01-15 DIAGNOSIS — Z6831 Body mass index (BMI) 31.0-31.9, adult: Secondary | ICD-10-CM

## 2022-01-15 DIAGNOSIS — I2583 Coronary atherosclerosis due to lipid rich plaque: Secondary | ICD-10-CM | POA: Diagnosis not present

## 2022-01-15 DIAGNOSIS — M542 Cervicalgia: Secondary | ICD-10-CM | POA: Diagnosis present

## 2022-01-15 DIAGNOSIS — M545 Low back pain, unspecified: Secondary | ICD-10-CM | POA: Diagnosis present

## 2022-01-15 DIAGNOSIS — F1010 Alcohol abuse, uncomplicated: Secondary | ICD-10-CM | POA: Diagnosis not present

## 2022-01-15 DIAGNOSIS — F32A Depression, unspecified: Secondary | ICD-10-CM | POA: Diagnosis present

## 2022-01-15 DIAGNOSIS — Z91199 Patient's noncompliance with other medical treatment and regimen due to unspecified reason: Secondary | ICD-10-CM

## 2022-01-15 DIAGNOSIS — Y831 Surgical operation with implant of artificial internal device as the cause of abnormal reaction of the patient, or of later complication, without mention of misadventure at the time of the procedure: Secondary | ICD-10-CM | POA: Diagnosis present

## 2022-01-15 DIAGNOSIS — M109 Gout, unspecified: Secondary | ICD-10-CM | POA: Diagnosis present

## 2022-01-15 DIAGNOSIS — E875 Hyperkalemia: Secondary | ICD-10-CM

## 2022-01-15 DIAGNOSIS — Z20822 Contact with and (suspected) exposure to covid-19: Secondary | ICD-10-CM | POA: Diagnosis present

## 2022-01-15 DIAGNOSIS — M25539 Pain in unspecified wrist: Secondary | ICD-10-CM

## 2022-01-15 DIAGNOSIS — I712 Thoracic aortic aneurysm, without rupture, unspecified: Secondary | ICD-10-CM | POA: Diagnosis present

## 2022-01-15 DIAGNOSIS — I110 Hypertensive heart disease with heart failure: Secondary | ICD-10-CM | POA: Diagnosis present

## 2022-01-15 DIAGNOSIS — I472 Ventricular tachycardia, unspecified: Secondary | ICD-10-CM | POA: Diagnosis not present

## 2022-01-15 DIAGNOSIS — I872 Venous insufficiency (chronic) (peripheral): Secondary | ICD-10-CM | POA: Diagnosis present

## 2022-01-15 DIAGNOSIS — R600 Localized edema: Secondary | ICD-10-CM

## 2022-01-15 DIAGNOSIS — M25532 Pain in left wrist: Secondary | ICD-10-CM

## 2022-01-15 DIAGNOSIS — I5021 Acute systolic (congestive) heart failure: Secondary | ICD-10-CM | POA: Diagnosis not present

## 2022-01-15 DIAGNOSIS — E876 Hypokalemia: Secondary | ICD-10-CM

## 2022-01-15 DIAGNOSIS — Z79899 Other long term (current) drug therapy: Secondary | ICD-10-CM

## 2022-01-15 DIAGNOSIS — R9431 Abnormal electrocardiogram [ECG] [EKG]: Secondary | ICD-10-CM

## 2022-01-15 DIAGNOSIS — Z5900 Homelessness unspecified: Secondary | ICD-10-CM

## 2022-01-15 DIAGNOSIS — Z96619 Presence of unspecified artificial shoulder joint: Secondary | ICD-10-CM | POA: Diagnosis present

## 2022-01-15 DIAGNOSIS — S43015A Anterior dislocation of left humerus, initial encounter: Secondary | ICD-10-CM | POA: Diagnosis present

## 2022-01-15 DIAGNOSIS — F5104 Psychophysiologic insomnia: Secondary | ICD-10-CM | POA: Diagnosis present

## 2022-01-15 DIAGNOSIS — Z9842 Cataract extraction status, left eye: Secondary | ICD-10-CM

## 2022-01-15 DIAGNOSIS — Z8614 Personal history of Methicillin resistant Staphylococcus aureus infection: Secondary | ICD-10-CM

## 2022-01-15 DIAGNOSIS — K219 Gastro-esophageal reflux disease without esophagitis: Secondary | ICD-10-CM | POA: Diagnosis present

## 2022-01-15 DIAGNOSIS — Z9049 Acquired absence of other specified parts of digestive tract: Secondary | ICD-10-CM

## 2022-01-15 DIAGNOSIS — Z9841 Cataract extraction status, right eye: Secondary | ICD-10-CM

## 2022-01-15 DIAGNOSIS — I5043 Acute on chronic combined systolic (congestive) and diastolic (congestive) heart failure: Secondary | ICD-10-CM | POA: Diagnosis present

## 2022-01-15 DIAGNOSIS — Z885 Allergy status to narcotic agent status: Secondary | ICD-10-CM

## 2022-01-15 DIAGNOSIS — T8450XD Infection and inflammatory reaction due to unspecified internal joint prosthesis, subsequent encounter: Secondary | ICD-10-CM | POA: Diagnosis not present

## 2022-01-15 DIAGNOSIS — T8149XA Infection following a procedure, other surgical site, initial encounter: Secondary | ICD-10-CM

## 2022-01-15 DIAGNOSIS — Z9889 Other specified postprocedural states: Secondary | ICD-10-CM

## 2022-01-15 DIAGNOSIS — W19XXXA Unspecified fall, initial encounter: Secondary | ICD-10-CM | POA: Diagnosis present

## 2022-01-15 DIAGNOSIS — M549 Dorsalgia, unspecified: Secondary | ICD-10-CM | POA: Diagnosis present

## 2022-01-15 DIAGNOSIS — S43006A Unspecified dislocation of unspecified shoulder joint, initial encounter: Secondary | ICD-10-CM

## 2022-01-15 DIAGNOSIS — Z951 Presence of aortocoronary bypass graft: Secondary | ICD-10-CM

## 2022-01-15 DIAGNOSIS — G8929 Other chronic pain: Secondary | ICD-10-CM | POA: Diagnosis present

## 2022-01-15 DIAGNOSIS — T8459XD Infection and inflammatory reaction due to other internal joint prosthesis, subsequent encounter: Secondary | ICD-10-CM | POA: Diagnosis not present

## 2022-01-15 DIAGNOSIS — Z86718 Personal history of other venous thrombosis and embolism: Secondary | ICD-10-CM

## 2022-01-15 DIAGNOSIS — R079 Chest pain, unspecified: Secondary | ICD-10-CM | POA: Insufficient documentation

## 2022-01-15 DIAGNOSIS — A4902 Methicillin resistant Staphylococcus aureus infection, unspecified site: Secondary | ICD-10-CM | POA: Diagnosis present

## 2022-01-15 LAB — SEDIMENTATION RATE: Sed Rate: 44 mm/hr — ABNORMAL HIGH (ref 0–16)

## 2022-01-15 LAB — COMPREHENSIVE METABOLIC PANEL
ALT: 16 U/L (ref 0–44)
AST: 22 U/L (ref 15–41)
Albumin: 2.8 g/dL — ABNORMAL LOW (ref 3.5–5.0)
Alkaline Phosphatase: 95 U/L (ref 38–126)
Anion gap: 9 (ref 5–15)
BUN: 21 mg/dL (ref 8–23)
CO2: 20 mmol/L — ABNORMAL LOW (ref 22–32)
Calcium: 8.5 mg/dL — ABNORMAL LOW (ref 8.9–10.3)
Chloride: 108 mmol/L (ref 98–111)
Creatinine, Ser: 0.95 mg/dL (ref 0.61–1.24)
GFR, Estimated: >60 mL/min (ref >60)
Glucose, Bld: 75 mg/dL (ref 70–99)
Potassium: 4.3 mmol/L (ref 3.5–5.1)
Sodium: 137 mmol/L (ref 135–145)
Total Bilirubin: 0.9 mg/dL (ref 0.3–1.2)
Total Protein: 6.1 g/dL — ABNORMAL LOW (ref 6.5–8.1)

## 2022-01-15 LAB — CBC WITH DIFFERENTIAL/PLATELET
Abs Immature Granulocytes: 0.02 10*3/uL (ref 0.00–0.07)
Basophils Absolute: 0 10*3/uL (ref 0.0–0.1)
Basophils Relative: 1 %
Eosinophils Absolute: 0.2 10*3/uL (ref 0.0–0.5)
Eosinophils Relative: 3 %
HCT: 30.6 % — ABNORMAL LOW (ref 39.0–52.0)
Hemoglobin: 9.1 g/dL — ABNORMAL LOW (ref 13.0–17.0)
Immature Granulocytes: 0 %
Lymphocytes Relative: 21 %
Lymphs Abs: 1.1 10*3/uL (ref 0.7–4.0)
MCH: 26.2 pg (ref 26.0–34.0)
MCHC: 29.7 g/dL — ABNORMAL LOW (ref 30.0–36.0)
MCV: 88.2 fL (ref 80.0–100.0)
Monocytes Absolute: 0.5 10*3/uL (ref 0.1–1.0)
Monocytes Relative: 9 %
Neutro Abs: 3.4 10*3/uL (ref 1.7–7.7)
Neutrophils Relative %: 66 %
Platelets: 238 10*3/uL (ref 150–400)
RBC: 3.47 MIL/uL — ABNORMAL LOW (ref 4.22–5.81)
RDW: 18 % — ABNORMAL HIGH (ref 11.5–15.5)
WBC: 5.1 10*3/uL (ref 4.0–10.5)
nRBC: 0 % (ref 0.0–0.2)

## 2022-01-15 LAB — BRAIN NATRIURETIC PEPTIDE: B Natriuretic Peptide: 498.1 pg/mL — ABNORMAL HIGH (ref 0.0–100.0)

## 2022-01-15 LAB — TROPONIN I (HIGH SENSITIVITY)
Troponin I (High Sensitivity): 15 ng/L (ref <18)
Troponin I (High Sensitivity): 17 ng/L (ref <18)

## 2022-01-15 LAB — ETHANOL: Alcohol, Ethyl (B): <10 mg/dL (ref <10)

## 2022-01-15 MED ORDER — FUROSEMIDE 10 MG/ML IJ SOLN
40.0000 mg | Freq: Once | INTRAMUSCULAR | Status: AC
  Administered 2022-01-15: 40 mg via INTRAVENOUS
  Filled 2022-01-15: qty 4

## 2022-01-15 MED ORDER — FUROSEMIDE 10 MG/ML IJ SOLN
40.0000 mg | Freq: Every day | INTRAMUSCULAR | Status: DC
  Administered 2022-01-17 – 2022-01-18 (×2): 40 mg via INTRAVENOUS
  Filled 2022-01-15 (×3): qty 4

## 2022-01-15 MED ORDER — ASPIRIN 81 MG PO CHEW
324.0000 mg | CHEWABLE_TABLET | Freq: Once | ORAL | Status: AC
Start: 2022-01-15 — End: 2022-01-15
  Administered 2022-01-15: 324 mg via ORAL
  Filled 2022-01-15: qty 4

## 2022-01-15 NOTE — ED Provider Notes (Signed)
[facility] DEPT Provider Note   CSN: [id number] Arrival date & time: 01/15/22  1842     History  No chief complaint on file.   Jeremiah [name] is a 71 y.o. male.  HPI Patient presents via EMS with concern for chest pain.  Patient acknowledges multiple medical issues, seemingly takes none of his medication regularly.  His history is notable for prior bypass, both cardiac and gastric.  Also notable is the patient's prior left shoulder replacement, performed here.  He notes that at some point greater than 1 month ago he had a fall, sustained a dislocation of his left shoulder.  Shoulder has been out since that point.  He also notes that he has had ongoing drainage from a wound on the anterior aspect of the shoulder from one of his prior procedures.  Drainage is essentially unchanged.  Today, however, he developed more pain than usual in the left shoulder, left chest, prompting EMS transport.  EMS reports the patient was awake and alert throughout transport.    Home Medications Prior to Admission medications   Medication Sig Start Date End Date Taking? Authorizing Provider  allopurinol (ZYLOPRIM) 100 MG tablet Take 100 mg by mouth 2 (two) times daily.     [provider]  ALPRAZolam Duanne Moron) 1 MG tablet Take 1 mg by mouth every 8 (eight) hours as needed for anxiety.     [provider]  aspirin 325 MG tablet Take 325 mg by mouth daily.    [provider]  bumetanide (BUMEX) 0.5 MG tablet Take 0.5 mg by mouth daily. 02/19/20   [provider]  buPROPion (WELLBUTRIN XL) 300 MG 24 hr tablet Take 300 mg by mouth every morning.    [provider]  Calcium Carbonate-Vitamin D (CALCIUM-VITAMIN D3 PO) Take 2 tablets by mouth daily.     [provider]  cyanocobalamin 500 MCG tablet Take 1,000 mcg by mouth daily.     [provider]  escitalopram (LEXAPRO) 20 MG tablet Take 20 mg by mouth daily. 02/13/20    [provider]  fluticasone (FLONASE) 50 MCG/ACT nasal spray Place 1 spray into both nostrils daily. 02/15/20   [provider]  labetalol (NORMODYNE) 100 MG tablet TAKE 1 TABLET BY MOUTH EVERY DAY AS NEEDED FOR BLOOD PRESSURE 03/22/21   Josue Hector, MD  levocetirizine (XYZAL) 5 MG tablet Take 5 mg by mouth every evening.  10/09/15   [provider]  lisinopril (ZESTRIL) 10 MG tablet Take 1 tablet (10 mg total) by mouth as needed (blood pressure). 07/20/19   Kroeger, Lorelee Cover., PA-C  oxyCODONE-acetaminophen (PERCOCET) 10-325 MG tablet Take 1 tablet by mouth every 6 (six) hours as needed for pain.  12/09/15   [provider]  Pediatric Multivit-Minerals-C (GUMMI BEAR MULTIVITAMIN/MIN PO) Take 2 each by mouth daily.     [provider]  polyethylene glycol (MIRALAX / GLYCOLAX) packet Take 17 g by mouth daily as needed for mild constipation, moderate constipation or severe constipation.    [provider]  simvastatin (ZOCOR) 40 MG tablet Take 40 mg by mouth every evening.     [provider]  zolpidem (AMBIEN) 10 MG tablet Take 10 mg by mouth at bedtime.    [provider]      Allergies    Patient has no known allergies.    Review of Systems   Review of Systems  Constitutional:        Per HPI,  otherwise negative  HENT:         Per HPI, otherwise negative  Respiratory:         Per HPI, otherwise negative  Cardiovascular:        Per HPI, otherwise negative  Gastrointestinal:  Negative for vomiting.  Endocrine:       Negative aside from HPI  Genitourinary:        Neg aside from HPI   Musculoskeletal:        Per HPI, otherwise negative  Skin:  Positive for wound.  Neurological:  Negative for syncope.   Physical Exam Updated Vital Signs BP 122/78    Pulse 64    Temp (!) 97.5 F (36.4 C) (Oral)    Resp 15    SpO2 100%  Physical Exam Vitals and nursing note reviewed.  Constitutional:      General: He is not in  acute distress.    Appearance: He is well-developed. He is ill-appearing.     Comments: Elderly male awake and alert sitting upright with substantial redundant skin folds in his torso, diffuse edema bilaterally, symmetrical in his lower extremities  HENT:     Head: Normocephalic and atraumatic.  Eyes:     Conjunctiva/sclera: Conjunctivae normal.  Cardiovascular:     Rate and Rhythm: Normal rate and regular rhythm.  Pulmonary:     Effort: Pulmonary effort is normal. No respiratory distress.     Breath sounds: No stridor.  Abdominal:     General: There is no distension.  Musculoskeletal:     Right lower leg: Edema present.     Left lower leg: Edema present.  Skin:    General: Skin is warm and dry.       Neurological:     Mental Status: He is alert and oriented to person, place, and time.    ED Results / Procedures / Treatments   Labs (all labs ordered are listed, but only abnormal results are displayed) Labs Reviewed  COMPREHENSIVE METABOLIC PANEL - Abnormal; Notable for the following components:      Result Value   CO2 20 (*)    Calcium 8.5 (*)    Total Protein 6.1 (*)    Albumin 2.8 (*)    All other components within normal limits  CBC WITH DIFFERENTIAL/PLATELET - Abnormal; Notable for the following components:   RBC 3.47 (*)    Hemoglobin 9.1 (*)    HCT 30.6 (*)    MCHC 29.7 (*)    RDW 18.0 (*)    All other components within normal limits  ETHANOL  BRAIN NATRIURETIC PEPTIDE  SEDIMENTATION RATE  C-REACTIVE PROTEIN  TROPONIN I (HIGH SENSITIVITY)  TROPONIN I (HIGH SENSITIVITY)    EKG EKG Interpretation  Date/Time:  Saturday January 15 2022 19:00:33 EST Ventricular Rate:  65 PR Interval:    QRS Duration: 142 QT Interval:  605 QTC Calculation: 630 R Axis:   -60 Text Interpretation: Atrial flutter with predominant 4:1 AV block IVCD, consider atypical RBBB LVH with IVCD and secondary repol abnrm Prolonged QT interval Abnormal ECG Confirmed by Carmin Muskrat  [id number]) on 01/15/2022 10:33:34 PM  Radiology DG Chest 2 View  Result Date: 01/15/2022 CLINICAL DATA:  Fall, chest pain EXAM: CHEST - 2 VIEW COMPARISON:  03/29/2021 FINDINGS: Lungs are clear. No pneumothorax or pleural effusion. Coronary artery bypass grafting has been performed. Cardiomegaly is stable. Pulmonary vascularity is normal. Left reversed total shoulder arthroplasty has been performed with anteromedial dislocation of the humeral component. IMPRESSION: Left  reversed total shoulder arthroplasty anteromedial dislocation. Stable cardiomegaly. Electronically Signed   By: Fidela Salisbury M.D.   On: 01/15/2022 19:36   DG Shoulder Left  Result Date: 01/15/2022 CLINICAL DATA:  Fall, left shoulder pain EXAM: LEFT SHOULDER - 2+ VIEW COMPARISON:  None. FINDINGS: Surgical changes of left reverse total shoulder arthroplasty are identified. There is anterior dislocation of the humeral component in relation to the glenoid component. Moderate acromioclavicular degenerative arthritis noted. No acute fracture. Limited evaluation of the left hemithorax is unremarkable. IMPRESSION: Anterior dislocation of the left reversed total shoulder arthroplasty. Electronically Signed   By: Fidela Salisbury M.D.   On: 01/15/2022 19:35    Procedures Procedures    Medications Ordered in ED Medications  aspirin chewable tablet 324 mg (has no administration in time range)    ED Course/ Medical Decision Making/ A&P  This patient presents to the ED for concern of chest pain and shoulder pain, this involves an extensive number of treatment options, and is a complaint that carries with it a high risk of complications and morbidity.  The differential diagnosis includes ACS, pneumonia, pneumothorax, wound infection   Co morbidities that complicate the patient evaluation  CABG, gastric bypass, shoulder replacement, falls, recent death of wife   Social Determinants of Health:  Semitransient status, grief from death of  wife   Additional history obtained:  Additional history and/or information obtained from chart review External records from outside source obtained and reviewed including basic notes for years ago left total shoulder reverse replacement, history of CABG   After the initial evaluation, orders, including: Labs, EKG, x-ray were initiated.  Patient placed on Cardiac and Pulse-Oximetry Monitors. The patient was maintained on a cardiac monitor.  The cardiac monitored showed an rhythm of a flutter, 65 abnormal The patient was also maintained on pulse oximetry. The readings were typically 99% room air normal  On repeat evaluation of the patient stayed the same He has received aspirin, Lasix Lab Tests:  I personally interpreted labs.  The pertinent results include: Unremarkable troponin eval, anemia, though the patient has had this previously  Imaging Studies ordered:  I independently visualized and interpreted imaging which showed dislocation of left shoulder I agree with the radiologist interpretation  Consultations Obtained:  I requested consultation with the orthopedic team, Dr. Louanne Skye,  and discussed lab and imaging findings as well as pertinent plan - they recommend: Consultation in the a.m. is also discussed with our internal medicine team for admission  Dispostion / Final MDM:  After consideration of the diagnostic results and the patient's response to treatment, he will be admitted for monitoring, management.  This adult male with multiple medical issues including CABG, gastric bypass, left total shoulder replacement now presents with chest pain, shoulder pain, and on exam is awake and alert, grossly edematous in his lower extremities, and with a wound that is open in his left shoulder.  Patient is not hypoxic, not an extremis, but has substantial risk profile for cardiac event, as well as concern for CHF.  Complicating this is the patient's orthopedic issue, not immediately concerning  for septic arthritis, but given the dislocation, which may have occurred at least 1 month ago, with serous discharge from his wound I discussed this case with the orthopedic team and they will follow as a consulting service for consideration of revision versus other intervention.  Given the patient's risk profile, as above, he was admitted for monitoring, management.  Final diagnoses:  Atypical chest pain  Anterior dislocation  of left shoulder, initial encounter       Carmin Muskrat, MD 01/15/22 2238
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
## 2022-01-15 NOTE — ED Notes (Signed)
IV with blood draw attempted without success.

## 2022-01-15 NOTE — ED Triage Notes (Signed)
[place] with c/o wound to left shoulder causing severe pain radiating to the chest. Patient is homeless.

## 2022-01-16 ENCOUNTER — Inpatient Hospital Stay (HOSPITAL_COMMUNITY): Payer: Medicare Other

## 2022-01-16 ENCOUNTER — Other Ambulatory Visit: Payer: Self-pay

## 2022-01-16 DIAGNOSIS — E538 Deficiency of other specified B group vitamins: Secondary | ICD-10-CM

## 2022-01-16 DIAGNOSIS — S41002A Unspecified open wound of left shoulder, initial encounter: Secondary | ICD-10-CM

## 2022-01-16 DIAGNOSIS — I5021 Acute systolic (congestive) heart failure: Secondary | ICD-10-CM | POA: Diagnosis not present

## 2022-01-16 DIAGNOSIS — R600 Localized edema: Secondary | ICD-10-CM | POA: Insufficient documentation

## 2022-01-16 DIAGNOSIS — S40012A Contusion of left shoulder, initial encounter: Secondary | ICD-10-CM | POA: Insufficient documentation

## 2022-01-16 DIAGNOSIS — M25512 Pain in left shoulder: Secondary | ICD-10-CM

## 2022-01-16 DIAGNOSIS — R7989 Other specified abnormal findings of blood chemistry: Secondary | ICD-10-CM

## 2022-01-16 DIAGNOSIS — I4892 Unspecified atrial flutter: Secondary | ICD-10-CM | POA: Diagnosis not present

## 2022-01-16 DIAGNOSIS — I5042 Chronic combined systolic (congestive) and diastolic (congestive) heart failure: Secondary | ICD-10-CM | POA: Diagnosis present

## 2022-01-16 DIAGNOSIS — I5043 Acute on chronic combined systolic (congestive) and diastolic (congestive) heart failure: Secondary | ICD-10-CM | POA: Diagnosis present

## 2022-01-16 DIAGNOSIS — D500 Iron deficiency anemia secondary to blood loss (chronic): Secondary | ICD-10-CM | POA: Diagnosis present

## 2022-01-16 DIAGNOSIS — I5041 Acute combined systolic (congestive) and diastolic (congestive) heart failure: Secondary | ICD-10-CM

## 2022-01-16 DIAGNOSIS — D649 Anemia, unspecified: Secondary | ICD-10-CM

## 2022-01-16 DIAGNOSIS — F1010 Alcohol abuse, uncomplicated: Secondary | ICD-10-CM

## 2022-01-16 DIAGNOSIS — M24412 Recurrent dislocation, left shoulder: Secondary | ICD-10-CM

## 2022-01-16 DIAGNOSIS — F32A Depression, unspecified: Secondary | ICD-10-CM

## 2022-01-16 DIAGNOSIS — R9431 Abnormal electrocardiogram [ECG] [EKG]: Secondary | ICD-10-CM

## 2022-01-16 DIAGNOSIS — I959 Hypotension, unspecified: Secondary | ICD-10-CM

## 2022-01-16 LAB — ECHOCARDIOGRAM COMPLETE
Area-P 1/2: 5.56 cm2
Calc EF: 40.6 %
Height: 67.008 in
P 1/2 time: 379 msec
S' Lateral: 4.5 cm
Single Plane A2C EF: 44.8 %
Single Plane A4C EF: 33.5 %
Weight: 3040.58 oz

## 2022-01-16 LAB — CBC
HCT: 29.8 % — ABNORMAL LOW (ref 39.0–52.0)
Hemoglobin: 8.9 g/dL — ABNORMAL LOW (ref 13.0–17.0)
MCH: 26.3 pg (ref 26.0–34.0)
MCHC: 29.9 g/dL — ABNORMAL LOW (ref 30.0–36.0)
MCV: 87.9 fL (ref 80.0–100.0)
Platelets: 252 10*3/uL (ref 150–400)
RBC: 3.39 MIL/uL — ABNORMAL LOW (ref 4.22–5.81)
RDW: 17.9 % — ABNORMAL HIGH (ref 11.5–15.5)
WBC: 3.9 10*3/uL — ABNORMAL LOW (ref 4.0–10.5)
nRBC: 0 % (ref 0.0–0.2)

## 2022-01-16 LAB — RAPID URINE DRUG SCREEN, HOSP PERFORMED
Amphetamines: NOT DETECTED
Barbiturates: NOT DETECTED
Benzodiazepines: POSITIVE — AB
Cocaine: NOT DETECTED
Opiates: NOT DETECTED
Tetrahydrocannabinol: NOT DETECTED

## 2022-01-16 LAB — VITAMIN B12: Vitamin B-12: 176 pg/mL — ABNORMAL LOW (ref 180–914)

## 2022-01-16 LAB — RESP PANEL BY RT-PCR (FLU A&B, COVID) ARPGX2
Influenza A by PCR: NEGATIVE
Influenza B by PCR: NEGATIVE
SARS Coronavirus 2 by RT PCR: NEGATIVE

## 2022-01-16 LAB — IRON AND TIBC
Iron: 24 ug/dL — ABNORMAL LOW (ref 45–182)
Saturation Ratios: 8 % — ABNORMAL LOW (ref 17.9–39.5)
TIBC: 283 ug/dL (ref 250–450)
UIBC: 259 ug/dL

## 2022-01-16 LAB — D-DIMER, QUANTITATIVE: D-Dimer, Quant: 1.13 ug/mL-FEU — ABNORMAL HIGH (ref 0.00–0.50)

## 2022-01-16 LAB — FOLATE: Folate: 20.8 ng/mL (ref >5.9)

## 2022-01-16 LAB — C-REACTIVE PROTEIN: CRP: 7.7 mg/dL — ABNORMAL HIGH (ref <1.0)

## 2022-01-16 LAB — HIV ANTIBODY (ROUTINE TESTING W REFLEX): HIV Screen 4th Generation wRfx: NONREACTIVE

## 2022-01-16 LAB — TSH: TSH: 1.588 u[IU]/mL (ref 0.350–4.500)

## 2022-01-16 MED ORDER — CYANOCOBALAMIN 1000 MCG/ML IJ SOLN
1000.0000 ug | Freq: Once | INTRAMUSCULAR | Status: AC
  Administered 2022-01-16: 1000 ug via INTRAMUSCULAR
  Filled 2022-01-16 (×2): qty 1

## 2022-01-16 MED ORDER — VITAMIN B-12 1000 MCG PO TABS
1000.0000 ug | ORAL_TABLET | Freq: Every day | ORAL | Status: DC
  Administered 2022-01-17 – 2022-03-08 (×51): 1000 ug via ORAL
  Filled 2022-01-16 (×51): qty 1

## 2022-01-16 MED ORDER — OXYCODONE HCL 5 MG PO TABS
5.0000 mg | ORAL_TABLET | ORAL | Status: DC | PRN
  Administered 2022-01-16 – 2022-01-19 (×12): 5 mg via ORAL
  Filled 2022-01-16 (×12): qty 1

## 2022-01-16 MED ORDER — HYDROXYZINE HCL 10 MG PO TABS
10.0000 mg | ORAL_TABLET | ORAL | Status: DC | PRN
  Administered 2022-01-16 – 2022-03-08 (×53): 10 mg via ORAL
  Filled 2022-01-16 (×59): qty 1

## 2022-01-16 MED ORDER — HYDROCODONE-ACETAMINOPHEN 5-325 MG PO TABS
1.0000 | ORAL_TABLET | Freq: Four times a day (QID) | ORAL | Status: DC | PRN
  Administered 2022-01-16 (×3): 1 via ORAL
  Filled 2022-01-16 (×3): qty 1

## 2022-01-16 MED ORDER — SODIUM CHLORIDE 0.9 % IV BOLUS
250.0000 mL | Freq: Once | INTRAVENOUS | Status: AC
Start: 2022-01-16 — End: 2022-01-16
  Administered 2022-01-16: 250 mL via INTRAVENOUS

## 2022-01-16 MED ORDER — KETOROLAC TROMETHAMINE 15 MG/ML IJ SOLN
15.0000 mg | Freq: Four times a day (QID) | INTRAMUSCULAR | Status: DC | PRN
  Administered 2022-01-16 (×2): 15 mg via INTRAVENOUS
  Filled 2022-01-16 (×2): qty 1

## 2022-01-16 MED ORDER — ACETAMINOPHEN 325 MG PO TABS
650.0000 mg | ORAL_TABLET | Freq: Four times a day (QID) | ORAL | Status: DC | PRN
  Administered 2022-01-16 – 2022-02-28 (×32): 650 mg via ORAL
  Filled 2022-01-16 (×34): qty 2

## 2022-01-16 MED ORDER — MELATONIN 3 MG PO TABS
3.0000 mg | ORAL_TABLET | Freq: Every day | ORAL | Status: DC
  Administered 2022-01-16 – 2022-02-16 (×31): 3 mg via ORAL
  Filled 2022-01-16 (×32): qty 1

## 2022-01-16 MED ORDER — KETOROLAC TROMETHAMINE 15 MG/ML IJ SOLN
15.0000 mg | Freq: Once | INTRAMUSCULAR | Status: AC
  Administered 2022-01-16: 15 mg via INTRAVENOUS
  Filled 2022-01-16: qty 1

## 2022-01-16 MED ORDER — CYANOCOBALAMIN 1000 MCG/ML IJ SOLN: 1000.0000 ug | Freq: Every day | INTRAMUSCULAR | Status: DC

## 2022-01-16 MED ORDER — VANCOMYCIN HCL 2000 MG/400ML IV SOLN
2000.0000 mg | Freq: Once | INTRAVENOUS | Status: DC
Start: 2022-01-16 — End: 2022-01-16
  Filled 2022-01-16: qty 400

## 2022-01-16 MED ORDER — VANCOMYCIN HCL 1500 MG/300ML IV SOLN
1500.0000 mg | Freq: Once | INTRAVENOUS | Status: AC
  Administered 2022-01-16: 1500 mg via INTRAVENOUS
  Filled 2022-01-16: qty 300

## 2022-01-16 MED ORDER — HYDROXYZINE HCL 10 MG PO TABS
10.0000 mg | ORAL_TABLET | Freq: Three times a day (TID) | ORAL | Status: DC | PRN
  Administered 2022-01-16: 10 mg via ORAL
  Filled 2022-01-16 (×2): qty 1

## 2022-01-16 MED ORDER — IOHEXOL 350 MG/ML SOLN
75.0000 mL | Freq: Once | INTRAVENOUS | Status: AC | PRN
  Administered 2022-01-16: 75 mL via INTRAVENOUS

## 2022-01-16 MED ORDER — VANCOMYCIN HCL 1250 MG/250ML IV SOLN
1250.0000 mg | INTRAVENOUS | Status: DC
  Administered 2022-01-16 – 2022-01-17 (×2): 1250 mg via INTRAVENOUS
  Filled 2022-01-16 (×2): qty 250

## 2022-01-16 MED ORDER — PERFLUTREN LIPID MICROSPHERE
1.0000 mL | INTRAVENOUS | Status: AC | PRN
  Administered 2022-01-16: 4 mL via INTRAVENOUS
  Filled 2022-01-16: qty 10

## 2022-01-16 NOTE — Plan of Care (Signed)
  Problem: Education: Goal: Ability to demonstrate management of disease process will improve Outcome: Not Progressing Goal: Ability to verbalize understanding of medication therapies will improve Outcome: Not Progressing Goal: Individualized Educational Video(s) Outcome: Not Progressing   Problem: Activity: Goal: Capacity to carry out activities will improve Outcome: Not Progressing   Problem: Cardiac: Goal: Ability to achieve and maintain adequate cardiopulmonary perfusion will improve Outcome: Not Progressing   Problem: Education: Goal: Knowledge of General Education information will improve Description: Including pain rating scale, medication(s)/side effects and non-pharmacologic comfort measures Outcome: Not Progressing   Problem: Health Behavior/Discharge Planning: Goal: Ability to manage health-related needs will improve Outcome: Not Progressing   Problem: Clinical Measurements: Goal: Ability to maintain clinical measurements within normal limits will improve Outcome: Not Progressing Goal: Will remain free from infection Outcome: Not Progressing Goal: Diagnostic test results will improve Outcome: Not Progressing Goal: Respiratory complications will improve Outcome: Not Progressing Goal: Cardiovascular complication will be avoided Outcome: Not Progressing   Problem: Activity: Goal: Risk for activity intolerance will decrease Outcome: Not Progressing   Problem: Nutrition: Goal: Adequate nutrition will be maintained Outcome: Not Progressing   Problem: Coping: Goal: Level of anxiety will decrease Outcome: Not Progressing   Problem: Elimination: Goal: Will not experience complications related to bowel motility Outcome: Not Progressing Goal: Will not experience complications related to urinary retention Outcome: Not Progressing   Problem: Pain Managment: Goal: General experience of comfort will improve Outcome: Not Progressing   Problem: Safety: Goal:  Ability to remain free from injury will improve Outcome: Not Progressing   Problem: Skin Integrity: Goal: Risk for impaired skin integrity will decrease Outcome: Not Progressing   

## 2022-01-16 NOTE — Assessment & Plan Note (Addendum)
No withdrawal symptoms at this time..  Continue thiamine, folic acid.

## 2022-01-16 NOTE — Assessment & Plan Note (Addendum)
Continue hydroxyzine, Wellbutrin, buspirone.

## 2022-01-16 NOTE — Assessment & Plan Note (Addendum)
Monitoring EKG

## 2022-01-16 NOTE — ED Notes (Signed)
[place] ice and sandwich given x 2.
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
## 2022-01-16 NOTE — Assessment & Plan Note (Deleted)
See #1 °

## 2022-01-16 NOTE — Assessment & Plan Note (Addendum)
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 CT of the left shoulder showed fluid collection and underwent CT-guided biopsy on 01/17/2022 with removal of 30 mil of fluid status post removal of hardware, irrigation/debridement and placement of antibiotic spacer with 6-8 weeks of IV antibiotic.  Orthopedics and infectious disease followed the patient during hospitalization.

## 2022-01-16 NOTE — Assessment & Plan Note (Addendum)
Improved at this time.  Mildly elevated today.  We will continue to monitor.  Not on nodal blockers.  Might need antihypertensive if continues to remain high.

## 2022-01-16 NOTE — Progress Notes (Signed)
Pharmacy Antibiotic Note  CAMBREN HELM is a 71 y.o. male admitted on 01/15/2022 with medical history significant of CAD s/p bypass in 1998,Hx of PE and DVT on Eliquis, HTN, alcohol abuse with hx of withdrawal, opioid dependency previous on Suboxone, presents with acute onset left shoulder pain and left-sided neck pain.   Noted to have chronically dislocated left shoulder arthroplasty in the setting of likely infection.  Wound culture revealed MRSA however does not appear he has been taking his antibiotic.Pharmacy has been consulted for vancomycin dosing.  Plan: Vancomycin 1500mg  IV x 1 then 1250mg  q24h (AUC 498.2, Scr 0.95 Follow renal function, cultures and clinical course     Temp (24hrs), Avg:97.5 F (36.4 C), Min:97.5 F (36.4 C), Max:97.5 F (36.4 C)  Recent Labs  Lab 01/15/22 2130  WBC 5.1  CREATININE 0.95    CrCl cannot be calculated (Unknown ideal weight.).    Allergies  Allergen Reactions   Morphine Swelling, Hives and Rash    Antimicrobials this admission: 2/26 vanc >>  Dose adjustments this admission:   Microbiology results:    Thank you for allowing pharmacy to be a part of this patients care.  Dolly Rias RPh 01/16/2022, 1:06 AM

## 2022-01-16 NOTE — Assessment & Plan Note (Addendum)
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  Continue CHF protocol.  Seen by cardiology during this hospitalization.  Net IO Since Admission: -16,109.93 mL [02/03/22 1326]  On oral lasix daily low dose.  Appears compensated at this time.  Continue with spironolactone.  Continue with enalapril for blood pressure control.

## 2022-01-16 NOTE — Progress Notes (Signed)
PROGRESS NOTE Jeremiah [name]  ERX:[id number] DOB: [birth year] DOA: 01/15/2022 PCP: Carol Ada, MD   Brief Narrative/Hospital Course: 71 y.o. male with medical history significant of CAD s/p bypass in 1998,Hx of PE and DVT on Eliquis, HTN, alcohol abuse with hx of withdrawal, opioid dependency previous on Suboxone presented with acute left shoulder and left neck pain.  In the ED-is a limited historian.Frustrated that questions are being asked.Has not been on any of his medications.Reportedly patient lost his wife last year and moved out to [place] to live with his brother.  However did not get along with his sister-in-law and has recently moved back here to [place] last week.  And noted to be homeless and living in [place]. Noted to have multiple admissions recently in [place] for alcohol withdrawal.  In 10/2021 he was admitted following a fall and was noted to have a large left-sided chest wall hematoma  had multiple I&D and wound VAC in January due to persistent refractory hematoma, found to have chronically dislocated left shoulder arthroplasty in the setting of likely infection.  Wound culture revealed MRSA however does not appear he has been taking his antibiotic. In Care Everywhere 02/06/2021-CTA showed large burden of thromboembolic disease with central pulmonary artery distribution.  Saw Pulm in office with Dr Janett Billow at [facility]- who noted 'it sounds like his pulmonary embolism was associated with some lower extremity cellulitis which he was hospitalized and minimally mobile. He was treated with Eliquis, now off."  11/15/21- was discharged from [facility] a fall with chest wall hematoma/blood loss anemia chronic dislocation of left shoulder-had multiple procedures back treatment- was discharged on oral doxycycline  In the ED, hemodynamically stable afebrile Labs with anemia of 9.1 with no recent prior for  comparison.stable BMP, EKG -atrial flutter  and 4-1 conduction with RBBB,BNP of 498.  Troponin reassuring at 15-->17.Left shoulder x-ray showing anterior dislocation of left reverse total shoulder arthroplasty. ED PA discussed with orthopedic Dr. Louanne Skye who recommends sling overnight with formal consult in the morning. He was also given aspirin and 73m Lasix, IV vancomycin.  Chest x-ray with clear lungs./Cardiomegaly. Further labs with ESR 44 D-dimer 1.1 TSH 1.5 COVID flu negative alcohol less than 10 UDS positive for benzo    Subjective: Seen and examined this morning he was resting comfortably denies any chest pain. Left arm in sling Appears somewhat limited historian reports he has gone through a lot after his wife died-unable to have good recollection of the medical events. On room air  Assessment and Plan: * Shoulder dislocation, recurrent, left Left shoulder hematoma previous MRSA positive Ddislocation of shoulder arthroplasty ant: With previous history of chest wall hematoma history of debridement back placement and antibiotic treatment.  Patient reports he has been using sling not able to use his left arm for a while and complains of ongoing pain.  Orthopedic has been consulted for further management.  Continue pain control.  Previous history of MRSA infection, patient was on doxycycline-see HPI.  On empiric antibiotics for now-reviewed orthopedic note noted plan for CT of the left shoulder, IR guided aspiration.  Continue oral pain regimen add IV Toradol prn. Recent Labs  Lab 01/15/22 2130 01/16/22 0530  WBC 5.1 3.9*    Shoulder wound, left, initial encounter See above.  Left shoulder pain See above  [place] onset a flutter in the ED, patient complains of palpitation.  Follow-up echocardiogram.  TSH stable 1.5.Cardiology was consulted in the ED-notified Dr. TNanine Meanswith him  he reported he is not a candidate for anticoagulation no further cardiac work-up.  Lower leg edema See  above  Elevated brain natriuretic peptide (BNP) level Lower leg edema: Patient reports she was on Bumex but has not been compliant for month, history of CHF but previous echo and TEE with normal LVEF, echo is being repeated. Cont diuresis as blood pressure tolerates.  History of pulmonary embolism- (present on admission) See above  Elevated d-dimer History of previous PE: back in March 2022-was on eliquis and not currently-see HPI. Patient was seen by pulmonary as outpatient previously in [place]. We will obtain CT angio chest.  Currently not hypoxic no significant chest pain no tachycardia  Hypotension This morning-blood pressure in 80s now up in 90s asymptomatic we will order 250 mill bolus. Hold lasix.  B12 deficiency We will start high-dose replacement  Depression Mood stable resume meds  Alcohol abuse Cessation counseling once for withdrawal symptoms, currently homeless  Normocytic anemia Hemoglobin holding around 8 to 9 g.  Monitor   Prolonged QT interval Monitoring EKG  DVT prophylaxis: SCDs Start: 01/15/22 2347 Code Status:   Code Status: Full Code Family Communication: plan of care discussed with patient at bedside. Disposition: Currently not medically stable for discharge. Status is: Inpatient Remains inpatient appropriate because: Ongoing management left shoulder pain, ortho eval.  Objective: Vitals last 24 hrs: Vitals:   01/16/22 1010 01/16/22 1011 01/16/22 1017 01/16/22 1258  BP: (!) 86/59 (!) 89/59 (!) 92/58 103/71  Pulse: (!) 43 67  66  Resp: 14     Temp: 98.9 F (37.2 C)   (!) 97.3 F (36.3 C)  TempSrc: Oral   Oral  SpO2: 99%   100%  Weight:   86.2 kg   Height:       Weight change:   Physical Examination: General exam: AA, poor historian,older than stated age, weak appearing. HEENT:Oral mucosa moist, Ear/Nose WNL grossly, dentition normal. Respiratory system: bilaterally clear BS, no use of accessory muscle Cardiovascular system: S1 & S2  +, No JVD,. Gastrointestinal system: Abdomen soft,NT,ND, BS+ Nervous System:Alert, awake, moving extremities and grossly nonfocal Extremities: LE edema ++,distal peripheral pulses palpable.  Left shoulder in sling previously scar some tenderness Skin: No rashes,no icterus. MSK: Normal muscle bulk,tone, power  Medications reviewed:  Scheduled Meds:  cyanocobalamin  1,000 mcg Intramuscular Once   furosemide  40 mg Intravenous Daily   [START ON 01/17/2022] vitamin B-12  1,000 mcg Oral Daily   Continuous Infusions:  vancomycin        Diet Order             Diet NPO time specified Except for: Sips with Meds  Diet effective midnight           Diet Heart Room service appropriate? Yes; Fluid consistency: Thin  Diet effective now                            Intake/Output Summary (Last 24 hours) at 01/16/2022 1410 Last data filed at 01/16/2022 1014 Gross per 24 hour  Intake 360 ml  Output 3550 ml  Net -3190 ml   Net IO Since Admission: -3,190 mL [01/16/22 1410]  Wt Readings from Last 3 Encounters:  01/16/22 86.2 kg  10/02/20 115.7 kg  03/17/20 109.8 kg     Unresulted Labs (From admission, onward)    None      Data Reviewed: I have personally reviewed following labs and imaging studies CBC: Recent  Labs  Lab 01/15/22 2130 01/16/22 0530  WBC 5.1 3.9*  NEUTROABS 3.4  --   HGB 9.1* 8.9*  HCT 30.6* 29.8*  MCV 88.2 87.9  PLT 238 622   Basic Metabolic Panel: Recent Labs  Lab 01/15/22 2130  NA 137  K 4.3  CL 108  CO2 20*  GLUCOSE 75  BUN 21  CREATININE 0.95  CALCIUM 8.5*   GFR: Estimated Creatinine Clearance: 76.9 mL/min (by C-G formula based on SCr of 0.95 mg/dL). Liver Function Tests: Recent Labs  Lab 01/15/22 2130  AST 22  ALT 16  ALKPHOS 95  BILITOT 0.9  PROT 6.1*  ALBUMIN 2.8*   No results for input(s): LIPASE, AMYLASE in the last 168 hours. No results for input(s): AMMONIA in the last 168 hours. Coagulation Profile: No results for  input(s): INR, PROTIME in the last 168 hours. Cardiac Enzymes: No results for input(s): CKTOTAL, CKMB, CKMBINDEX, TROPONINI in the last 168 hours. BNP (last 3 results) No results for input(s): PROBNP in the last 8760 hours. HbA1C: No results for input(s): HGBA1C in the last 72 hours. CBG: No results for input(s): GLUCAP in the last 168 hours. Lipid Profile: No results for input(s): CHOL, HDL, LDLCALC, TRIG, CHOLHDL, LDLDIRECT in the last 72 hours. Thyroid Function Tests: Recent Labs    01/16/22 0255  TSH 1.588   Anemia Panel: Recent Labs    01/16/22 0255  VITAMINB12 176*  FOLATE 20.8  TIBC 283  IRON 24*   Sepsis Labs: No results for input(s): PROCALCITON, LATICACIDVEN in the last 168 hours.  Recent Results (from the past 240 hour(s))  Resp Panel by RT-PCR (Flu A&B, Covid) Nasopharyngeal Swab     Status: None   Collection Time: 01/15/22 11:38 PM   Specimen: Nasopharyngeal Swab; Nasopharyngeal(NP) swabs in vial transport medium  Result Value Ref Range Status   SARS Coronavirus 2 by RT PCR NEGATIVE NEGATIVE Final    Comment: (NOTE) SARS-CoV-2 target nucleic acids are NOT DETECTED.  The SARS-CoV-2 RNA is generally detectable in upper respiratory specimens during the acute phase of infection. The lowest concentration of SARS-CoV-2 viral copies this assay can detect is 138 copies/mL. A negative result does not preclude SARS-Cov-2 infection and should not be used as the sole basis for treatment or other patient management decisions. A negative result may occur with  improper specimen collection/handling, submission of specimen other than nasopharyngeal swab, presence of viral mutation(s) within the areas targeted by this assay, and inadequate number of viral copies(<138 copies/mL). A negative result must be combined with clinical observations, patient history, and epidemiological information. The expected result is Negative.  Fact Sheet for Patients:   EntrepreneurPulse.com.au  Fact Sheet for Healthcare Providers:  IncredibleEmployment.be  This test is no t yet approved or cleared by the [place] FDA and  has been authorized for detection and/or diagnosis of SARS-CoV-2 by FDA under an Emergency Use Authorization (EUA). This EUA will remain  in effect (meaning this test can be used) for the duration of the COVID-19 declaration under Section 564(b)(1) of the Act, 21 U.S.C.section 360bbb-3(b)(1), unless the authorization is terminated  or revoked sooner.       Influenza A by PCR NEGATIVE NEGATIVE Final   Influenza B by PCR NEGATIVE NEGATIVE Final    Comment: (NOTE) The Xpert Xpress SARS-CoV-2/FLU/RSV plus assay is intended as an aid in the diagnosis of influenza from Nasopharyngeal swab specimens and should not be used as a sole basis for treatment. Nasal washings and aspirates are unacceptable  for Xpert Xpress SARS-CoV-2/FLU/RSV testing.  Fact Sheet for Patients: EntrepreneurPulse.com.au  Fact Sheet for Healthcare Providers: IncredibleEmployment.be  This test is not yet approved or cleared by the [place] FDA and has been authorized for detection and/or diagnosis of SARS-CoV-2 by FDA under an Emergency Use Authorization (EUA). This EUA will remain in effect (meaning this test can be used) for the duration of the COVID-19 declaration under Section 564(b)(1) of the Act, 21 U.S.C. section 360bbb-3(b)(1), unless the authorization is terminated or revoked.  Performed at [facility], [place] [address], [place], [place] [address]     Antimicrobials: Anti-infectives (From admission, onward)    Start     Dose/Rate Route Frequency Ordered Stop   01/16/22 2200  vancomycin (VANCOREADY) IVPB 1250 mg/250 mL        1,250 mg 166.7 mL/hr over 90 Minutes Intravenous Every 24 hours 01/16/22 0107     01/16/22 0030  vancomycin (VANCOREADY)  IVPB 1500 mg/300 mL        1,500 mg 150 mL/hr over 120 Minutes Intravenous  Once 01/16/22 0018 01/16/22 0240   01/16/22 0015  vancomycin (VANCOREADY) IVPB 2000 mg/400 mL  Status:  Discontinued        2,000 mg 200 mL/hr over 120 Minutes Intravenous  Once 01/16/22 0001 01/16/22 0018      Culture/Microbiology    Component Value Date/Time   SDES URINE, CLEAN CATCH 02/20/2018 1205   SPECREQUEST NONE 02/20/2018 1205   CULT (A) 02/20/2018 1205    <10,000 COLONIES/mL INSIGNIFICANT GROWTH Performed at [facility] [address] [place], [place] [address]    REPTSTATUS 02/21/2018 FINAL 02/20/2018 1205    Other culture-see note  Radiology Studies: DG Chest 2 View  Result Date: 01/15/2022 CLINICAL DATA:  Fall, chest pain EXAM: CHEST - 2 VIEW COMPARISON:  03/29/2021 FINDINGS: Lungs are clear. No pneumothorax or pleural effusion. Coronary artery bypass grafting has been performed. Cardiomegaly is stable. Pulmonary vascularity is normal. Left reversed total shoulder arthroplasty has been performed with anteromedial dislocation of the humeral component. IMPRESSION: Left reversed total shoulder arthroplasty anteromedial dislocation. Stable cardiomegaly. Electronically Signed   By: Fidela Salisbury M.D.   On: 01/15/2022 19:36   DG Shoulder Left  Result Date: 01/15/2022 CLINICAL DATA:  Fall, left shoulder pain EXAM: LEFT SHOULDER - 2+ VIEW COMPARISON:  None. FINDINGS: Surgical changes of left reverse total shoulder arthroplasty are identified. There is anterior dislocation of the humeral component in relation to the glenoid component. Moderate acromioclavicular degenerative arthritis noted. No acute fracture. Limited evaluation of the left hemithorax is unremarkable. IMPRESSION: Anterior dislocation of the left reversed total shoulder arthroplasty. Electronically Signed   By: Fidela Salisbury M.D.   On: 01/15/2022 19:35   ECHOCARDIOGRAM COMPLETE  Result Date: 01/16/2022    ECHOCARDIOGRAM REPORT    Patient Name:   Jeremiah [name] Date of Exam: 01/16/2022 Medical Rec #:  [id number]       Height:       67.0 in Accession #:    [id number]      Weight:       190.0 lb Date of Birth:  01/04/1951       BSA:          1.979 m Patient Age:    46 years        BP:           110/73 mmHg Patient Gender: M  HR:           66 bpm. Exam Location:  Inpatient Procedure: 2D Echo, Cardiac Doppler, Color Doppler and Intracardiac            Opacification Agent Indications:    CHF-Acute Systolic A54.09  History:        Patient has prior history of Echocardiogram examinations, most                 recent 07/02/2021. CAD, Prior CABG, Arrythmias:RBBB,                 Signs/Symptoms:Murmur; Risk Factors:Hypertension and                 Dyslipidemia. Past history of DVt and pulmonary embolism.  Sonographer:    Darlina Sicilian RDCS Referring Phys: [id number] [place] T TU  Sonographer Comments: Left arm resting across the patient chest in a sling. Unable to move left arm due to shoulder hematoma. IMPRESSIONS  1. Left ventricular ejection fraction, by estimation, is 35 to 40%. The left ventricle has moderately decreased function. The left ventricle demonstrates global hypokinesis. The left ventricular internal cavity size was mildly dilated. There is mild left ventricular hypertrophy. Left ventricular diastolic parameters are indeterminate.  2. Right ventricular systolic function is normal. The right ventricular size is normal. There is normal pulmonary artery systolic pressure.  3. Left atrial size was moderately dilated.  4. The mitral valve is normal in structure. Mild mitral valve regurgitation. No evidence of mitral stenosis.  5. Tricuspid valve regurgitation is mild to moderate.  6. The aortic valve is tricuspid. Aortic valve regurgitation is mild. Aortic valve sclerosis is present, with no evidence of aortic valve stenosis.  7. Aortic dilatation noted. There is mild dilatation of the ascending aorta, measuring 43 mm.  8. The  inferior vena cava is dilated in size with >50% respiratory variability, suggesting right atrial pressure of 8 mmHg. FINDINGS  Left Ventricle: Left ventricular ejection fraction, by estimation, is 35 to 40%. The left ventricle has moderately decreased function. The left ventricle demonstrates global hypokinesis. Definity contrast agent was given, it did not delineate the left ventricular endocardial borders. The left ventricular internal cavity size was mildly dilated. There is mild left ventricular hypertrophy. Left ventricular diastolic parameters are indeterminate. Right Ventricle: The right ventricular size is normal. Right ventricular systolic function is normal. There is normal pulmonary artery systolic pressure. The tricuspid regurgitant velocity is 2.50 m/s, and with an assumed right atrial pressure of 8 mmHg,  the estimated right ventricular systolic pressure is 82.9 mmHg. Left Atrium: Left atrial size was moderately dilated. Right Atrium: Right atrial size was normal in size. Pericardium: There is no evidence of pericardial effusion. Mitral Valve: The mitral valve is normal in structure. Mild mitral valve regurgitation. No evidence of mitral valve stenosis. Tricuspid Valve: The tricuspid valve is normal in structure. Tricuspid valve regurgitation is mild to moderate. No evidence of tricuspid stenosis. Aortic Valve: The aortic valve is tricuspid. Aortic valve regurgitation is mild. Aortic regurgitation PHT measures 379 msec. Aortic valve sclerosis is present, with no evidence of aortic valve stenosis. Pulmonic Valve: The pulmonic valve was normal in structure. Pulmonic valve regurgitation is mild. No evidence of pulmonic stenosis. Aorta: Aortic dilatation noted. There is mild dilatation of the ascending aorta, measuring 43 mm. Venous: The inferior vena cava is dilated in size with greater than 50% respiratory variability, suggesting right atrial pressure of 8 mmHg. IAS/Shunts: No atrial level shunt detected  by color  flow Doppler.  LEFT VENTRICLE PLAX 2D LVIDd:         5.40 cm      Diastology LVIDs:         4.50 cm      LV e' medial:    7.09 cm/s LV PW:         1.20 cm      LV E/e' medial:  16.6 LV IVS:        1.20 cm      LV e' lateral:   9.64 cm/s LVOT diam:     2.30 cm      LV E/e' lateral: 12.2 LV SV:         65 LV SV Index:   33 LVOT Area:     4.15 cm  LV Volumes (MOD) LV vol d, MOD A2C: 221.0 ml LV vol d, MOD A4C: 167.0 ml LV vol s, MOD A2C: 122.0 ml LV vol s, MOD A4C: 111.0 ml LV SV MOD A2C:     99.0 ml LV SV MOD A4C:     167.0 ml LV SV MOD BP:      80.1 ml RIGHT VENTRICLE RV S prime:     6.83 cm/s TAPSE (M-mode): 1.3 cm LEFT ATRIUM             Index        RIGHT ATRIUM           Index LA diam:        4.70 cm 2.38 cm/m   RA Area:     20.40 cm LA Vol (A2C):   82.3 ml 41.59 ml/m  RA Volume:   60.70 ml  30.68 ml/m LA Vol (A4C):   87.6 ml 44.27 ml/m LA Biplane Vol: 86.4 ml 43.66 ml/m  AORTIC VALVE LVOT Vmax:   79.60 cm/s LVOT Vmean:  47.300 cm/s LVOT VTI:    0.156 m AI PHT:      379 msec  AORTA Ao Root diam: 3.65 cm Ao Asc diam:  4.30 cm MITRAL VALVE                TRICUSPID VALVE MV Area (PHT): 5.56 cm     TR Peak grad:   25.0 mmHg MV Decel Time: 137 msec     TR Vmax:        250.00 cm/s MV E velocity: 118.00 cm/s                             SHUNTS                             Systemic VTI:  0.16 m                             Systemic Diam: 2.30 cm Kirk Ruths MD Electronically signed by Kirk Ruths MD Signature Date/Time: 01/16/2022/11:04:44 AM    Final      LOS: 1 day   Antonieta Pert, MD [facility]  01/16/2022, 2:10 PM

## 2022-01-16 NOTE — Assessment & Plan Note (Addendum)
Continue vitamin B12 replacement.
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
## 2022-01-16 NOTE — Assessment & Plan Note (Addendum)
Completed treatment with Eliquis.  Not currently on anticoagulation due to risk of falls homelessness, history of hematoma, ongoing alcohol abuse.

## 2022-01-16 NOTE — Progress Notes (Signed)
Patient ID: Jeremiah [name], male   DOB: 1951-05-13, 71 y.o.   MRN: [id number] Patient discussed with Dr. Marlou Sa, he requests CT scan of the left shoulder arthroplasty and order for an ultrasound guided aspiration of the left  Shoulder arthroplasty dislocation for cultures, anaerobic, aerobic, STAT gram stain and cell count with differential, protein and glucose.

## 2022-01-16 NOTE — Assessment & Plan Note (Addendum)
History of previous PE in March 2022-was on eliquis and not currently-see HPI CTA-no PE,suggesting right heart dysfunction, pulmonary artery hypertension.

## 2022-01-16 NOTE — Plan of Care (Signed)
  Problem: Education: Goal: Knowledge of General Education information will improve Description: Including pain rating scale, medication(s)/side effects and non-pharmacologic comfort measures Outcome: Progressing   Problem: Nutrition: Goal: Adequate nutrition will be maintained Outcome: Progressing   Problem: Coping: Goal: Level of anxiety will decrease Outcome: Progressing   

## 2022-01-16 NOTE — Hospital Course (Addendum)
Patient is a 71 y.o. male with medical history significant of CAD s/p bypass in 1998, Hx of PE and DVT on Eliquis, HTN, alcohol abuse with hx of withdrawal, opioid dependency previous on Suboxone presented to the hospital with left shoulder and neck pain.  Reportedly patient lost his wife last year and moved out to [place] to live with his brother. However did not get along with his sister-in-law and has recently moved back here to [place] l a week prior to presentation and noted to be homeless and living in [place]. Noted to have multiple admissions recently in [place] for alcohol withdrawal.  On 10/2021 he was admitted following a fall and was noted to have a large left-sided chest wall hematoma  had multiple I&D and wound VAC in January due to persistent refractory hematoma, found to have chronically dislocated left shoulder arthroplasty in the setting of likely infection.  On 02/06/2021-CTA showed large burden of thromboembolic disease with central pulmonary artery distribution.  Saw Pulm in office with Dr Janett Billow at [facility]- who noted 'it sounds like his pulmonary embolism was associated with some lower extremity cellulitis which he was hospitalized and minimally mobile. He was treated with Eliquis, now off.  In the ED on this admission patient was hemodynamically stable.  Labs showed some mild anemia of hemoglobin 9.1.  EKG showed atrial flutter.  Troponin was borderline.  Left shoulder x-ray showed anterior dislocation of left reverse total shoulder arthroplasty.  Patient had CTA chest on 01/16/2022 which showed no evidence of pulmonary embolism but pulmonary hypertension and cardiomegaly.  There was note of 4.7 cm thoracic aortic aneurysm.  Radiology recommended dysemia [place] CT or MRI follow-up and referral to cardiothoracic surgery as outpatient.  Patient was also seen by orthopedics during hospitalization  and the  CT of left shoulder-showed postsurgical changes and dislocation,  large periprosthetic fluid collection surrounding the proximal humerus and extending throughout the left axillary region up to 7.8 X4.4X 8.8-sequelae of postoperative seroma, hematoma versus particle disease.  Patient had 2D echocardiogram on 01/16/2022 which showed reduced LV function and cardiology was consulted.  On 01/17/2022 CT-guided biopsy of the left anterior axillary fluid was done with removal of 30 mils of cloudy fluid with.  Patient underwent removal of shoulder implant with excision of sinus tract antibiotic spacer placement and wound VAC placement by Dr. Marlou Sa on 01/18/2022.  On 01/19/2022 infectious disease was consulted who recommended prolonged IV antibiotic course. Patient has homelessness issues which has been a barrier for his disposition as well.  On 01/26/2022, patient was noted to be anemic and received 1 unit of packed RBC.  Otherwise he has remained stable except for ongoing pain issues.

## 2022-01-16 NOTE — Assessment & Plan Note (Addendum)
New onset a flutter in the ED Asymptomatic bradycardia during the night 2/26: TSH was within normal limit.  Patient did have pause for 2.90 seconds.  Cardiology was consulted.    Heart rate has been well controlled, not on AV blocking agents, at this point.  Continue to encourage mobility.
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
## 2022-01-16 NOTE — ED Notes (Signed)
[place] ice given. Pt has been resting comfortably in bed.

## 2022-01-16 NOTE — Assessment & Plan Note (Addendum)
Cell count has been stable with hgb at 8,4 on 03/31

## 2022-01-16 NOTE — Assessment & Plan Note (Deleted)
See above

## 2022-01-16 NOTE — Progress Notes (Addendum)
Request received for left shoulder aspiration.   Case discussed with Dr. Laurence Ferrari, will review CT shoulder once done.  Changed CT shoulder w/o to CT shoulder with contrast per Dr. Katrinka Blazing recommendation.   Per Dr. Laurence Ferrari, local anesthetic should be sufficient for [place] or CT guided shoulder aspiration.  However, patient currently admitted due to left shoulder pain, unclear if he will be able to tolerate the procedure.  Made NPO at Monday [place] for possible conscious sedation.   Please call IR for questions and concerns.   ADDENDUM CT reviewed by Dr. Laurence Ferrari, will attempt US guided aspiration with local only.  D/c'd NPO order.   Tera Mater PA-C 01/16/2022 12:36 PM

## 2022-01-16 NOTE — Assessment & Plan Note (Addendum)
See above

## 2022-01-16 NOTE — Assessment & Plan Note (Signed)
See above

## 2022-01-17 ENCOUNTER — Other Ambulatory Visit: Payer: Self-pay

## 2022-01-17 ENCOUNTER — Inpatient Hospital Stay (HOSPITAL_COMMUNITY): Payer: Medicare Other

## 2022-01-17 DIAGNOSIS — I5041 Acute combined systolic (congestive) and diastolic (congestive) heart failure: Secondary | ICD-10-CM

## 2022-01-17 DIAGNOSIS — S41002A Unspecified open wound of left shoulder, initial encounter: Secondary | ICD-10-CM | POA: Diagnosis not present

## 2022-01-17 DIAGNOSIS — I712 Thoracic aortic aneurysm, without rupture, unspecified: Secondary | ICD-10-CM

## 2022-01-17 DIAGNOSIS — I4892 Unspecified atrial flutter: Secondary | ICD-10-CM | POA: Diagnosis not present

## 2022-01-17 LAB — BASIC METABOLIC PANEL
Anion gap: 4 — ABNORMAL LOW (ref 5–15)
BUN: 29 mg/dL — ABNORMAL HIGH (ref 8–23)
CO2: 23 mmol/L (ref 22–32)
Calcium: 7.9 mg/dL — ABNORMAL LOW (ref 8.9–10.3)
Chloride: 107 mmol/L (ref 98–111)
Creatinine, Ser: 1.28 mg/dL — ABNORMAL HIGH (ref 0.61–1.24)
GFR, Estimated: >60 mL/min (ref >60)
Glucose, Bld: 79 mg/dL (ref 70–99)
Potassium: 4.3 mmol/L (ref 3.5–5.1)
Sodium: 134 mmol/L — ABNORMAL LOW (ref 135–145)

## 2022-01-17 LAB — PREALBUMIN: Prealbumin: 12.8 mg/dL — ABNORMAL LOW (ref 18–38)

## 2022-01-17 LAB — MAGNESIUM: Magnesium: 1.9 mg/dL (ref 1.7–2.4)

## 2022-01-17 LAB — TYPE AND SCREEN
ABO/RH(D): A POS
Antibody Screen: NEGATIVE

## 2022-01-17 LAB — ALBUMIN: Albumin: 2.3 g/dL — ABNORMAL LOW (ref 3.5–5.0)

## 2022-01-17 LAB — MRSA NEXT GEN BY PCR, NASAL: MRSA by PCR Next Gen: DETECTED — AB

## 2022-01-17 MED ORDER — FENTANYL CITRATE (PF) 100 MCG/2ML IJ SOLN
INTRAMUSCULAR | Status: AC
  Filled 2022-01-17: qty 2

## 2022-01-17 MED ORDER — MUPIROCIN 2 % EX OINT
1.0000 "application " | TOPICAL_OINTMENT | Freq: Two times a day (BID) | CUTANEOUS | Status: AC
  Administered 2022-01-17 – 2022-01-21 (×9): 1 via NASAL
  Filled 2022-01-17 (×3): qty 22

## 2022-01-17 MED ORDER — LIDOCAINE-EPINEPHRINE 1 %-1:100000 IJ SOLN
INTRAMUSCULAR | Status: DC | PRN
  Administered 2022-01-17: 10 mL via INTRADERMAL

## 2022-01-17 MED ORDER — FENTANYL CITRATE (PF) 100 MCG/2ML IJ SOLN
INTRAMUSCULAR | Status: DC | PRN
  Administered 2022-01-17: 50 ug via INTRAVENOUS
  Administered 2022-01-18: 100 ug via INTRAVENOUS

## 2022-01-17 MED ORDER — CHLORHEXIDINE GLUCONATE CLOTH 2 % EX PADS
6.0000 | MEDICATED_PAD | Freq: Every day | CUTANEOUS | Status: DC
  Administered 2022-01-17 – 2022-01-18 (×2): 6 via TOPICAL

## 2022-01-17 MED ORDER — ENOXAPARIN SODIUM 40 MG/0.4ML IJ SOSY: 40.0000 mg | PREFILLED_SYRINGE | INTRAMUSCULAR | Status: DC

## 2022-01-17 NOTE — Progress Notes (Addendum)
Progress Note  Patient Name: Jeremiah [name] Date of Encounter: 01/17/2022  [facility] Cardiologist: Jenkins Rouge, MD   Subjective   Pt states he knows he has CHF and he has not taken medications in a month. Other details regarding symptoms are unclear - poor historian.  He is currently homeless, not in a shelter.  Pt states he presented because he was short of breath. Notes indicate he came in for left shoulder pain that radiated to his chest with open wound over shoulder.  He denies angina, but states he knows something is wrong with his heart.  Inpatient Medications    Scheduled Meds:  Chlorhexidine Gluconate Cloth  6 each Topical Q0600   furosemide  40 mg Intravenous Daily   melatonin  3 mg Oral QHS   mupirocin ointment  1 application Nasal BID   vitamin B-12  1,000 mcg Oral Daily   Continuous Infusions:  vancomycin 1,250 mg (01/16/22 2248)   PRN Meds: acetaminophen, hydrOXYzine, oxyCODONE   Vital Signs    Vitals:   01/16/22 2210 01/17/22 0500 01/17/22 0612 01/17/22 0813  BP: 106/79  116/76 118/72  Pulse: 65  66 65  Resp:   20 18  Temp: 97.8 F (36.6 C)  97.7 F (36.5 C)   TempSrc: Oral  Oral   SpO2: 100%  100% 100%  Weight:  85.2 kg    Height:        Intake/Output Summary (Last 24 hours) at 01/17/2022 0915 Last data filed at 01/16/2022 1914 Gross per 24 hour  Intake 840 ml  Output 1000 ml  Net -160 ml   Last 3 Weights 01/17/2022 01/16/2022 01/16/2022  Weight (lbs) 187 lb 13.3 oz 190 lb 0.6 oz 190 lb 0.6 oz  Weight (kg) 85.2 kg 86.2 kg 86.2 kg      Telemetry    Atrial flutter with pauses of up to 2.99 seconds - Personally Reviewed  ECG    No new tracings - Personally Reviewed  Physical Exam   GEN: No acute distress.   Neck: + JVD Cardiac: irregular rhythm, regular rate, soft murmur Respiratory: Clear to auscultation bilaterally. GI: Soft, nontender, non-distended  MS: significant B at least 3+ pitting edema Neuro:  Nonfocal  Psych:  Normal affect   Labs    High Sensitivity Troponin:   Recent Labs  Lab 01/15/22 2130 01/15/22 2214  TROPONINIHS 15 17     Chemistry Recent Labs  Lab 01/15/22 2130 01/17/22 0231  NA 137 134*  K 4.3 4.3  CL 108 107  CO2 20* 23  GLUCOSE 75 79  BUN 21 29*  CREATININE 0.95 1.28*  CALCIUM 8.5* 7.9*  MG  --  1.9  PROT 6.1*  --   ALBUMIN 2.8*  --   AST 22  --   ALT 16  --   ALKPHOS 95  --   BILITOT 0.9  --   GFRNONAA >60 >60  ANIONGAP 9 4*    Lipids No results for input(s): CHOL, TRIG, HDL, LABVLDL, LDLCALC, CHOLHDL in the last 168 hours.  Hematology Recent Labs  Lab 01/15/22 2130 01/16/22 0530  WBC 5.1 3.9*  RBC 3.47* 3.39*  HGB 9.1* 8.9*  HCT 30.6* 29.8*  MCV 88.2 87.9  MCH 26.2 26.3  MCHC 29.7* 29.9*  RDW 18.0* 17.9*  PLT 238 252   Thyroid  Recent Labs  Lab 01/16/22 0255  TSH 1.588    BNP Recent Labs  Lab 01/15/22 2130  BNP 498.1*  DDimer  Recent Labs  Lab 01/16/22 0255  DDIMER 1.13*     Radiology    DG Chest 2 View  Result Date: 01/15/2022 CLINICAL DATA:  Fall, chest pain EXAM: CHEST - 2 VIEW COMPARISON:  03/29/2021 FINDINGS: Lungs are clear. No pneumothorax or pleural effusion. Coronary artery bypass grafting has been performed. Cardiomegaly is stable. Pulmonary vascularity is normal. Left reversed total shoulder arthroplasty has been performed with anteromedial dislocation of the humeral component. IMPRESSION: Left reversed total shoulder arthroplasty anteromedial dislocation. Stable cardiomegaly. Electronically Signed   By: Fidela Salisbury M.D.   On: 01/15/2022 19:36   CT Angio Chest Pulmonary Embolism (PE) W or WO Contrast  Result Date: 01/16/2022 CLINICAL DATA:  Fall, chest pain. Pulmonary embolism (PE) suspected, positive D-dimer. History of PE EXAM: CT ANGIOGRAPHY CHEST WITH CONTRAST TECHNIQUE: Multidetector CT imaging of the chest was performed using the standard protocol during bolus administration of intravenous contrast. Multiplanar  CT image reconstructions and MIPs were obtained to evaluate the vascular anatomy. RADIATION DOSE REDUCTION: This exam was performed according to the departmental dose-optimization program which includes automated exposure control, adjustment of the mA and/or kV according to patient size and/or use of iterative reconstruction technique. CONTRAST:  2mL OMNIPAQUE IOHEXOL 350 MG/ML SOLN COMPARISON:  04/06/2021 FINDINGS: Cardiovascular: Satisfactory opacification of the pulmonary arteries to the segmental level. Evaluation of the more distal arterial branches is degraded by respiratory motion artifact. Within this limitation, there is no evidence of pulmonary embolism. Main pulmonary trunk measures 3.8 cm in diameter. Mid ascending thoracic aorta measures 4.7 cm in diameter. Scattered atherosclerotic calcifications of the aorta and coronary arteries. Cardiomegaly. Previous CABG. No pericardial effusion. Mediastinum/Nodes: No enlarged mediastinal, hilar, or axillary lymph nodes. Thyroid gland, trachea, and esophagus demonstrate no significant findings. Lungs/Pleura: Trace bilateral pleural effusions with associated bibasilar subsegmental atelectasis. Previously seen right upper lobe pulmonary nodule has resolved. No pneumothorax. Upper Abdomen: Reflux of contrast into the IVC and hepatic veins. Postsurgical changes at the gastroesophageal junction. No acute findings are evident within the included upper abdomen. Musculoskeletal: Dislocated left shoulder arthroplasty. Additional findings involving the left shoulder will be dictated on dedicated left shoulder CT. Severe osteoarthritis of the right shoulder with associated joint effusion. Degenerative changes of the thoracic spine without fracture. Review of the MIP images confirms the above findings. IMPRESSION: 1. Negative for pulmonary embolism. 2. Trace bilateral pleural effusions with associated bibasilar subsegmental atelectasis. 3. Cardiomegaly with reflux of  contrast into the IVC and hepatic veins, suggesting right heart dysfunction. 4. Dilated main pulmonary trunk suggesting pulmonary arterial hypertension. 5. Dislocated left shoulder arthroplasty with additional findings involving the left shoulder will be dictated on dedicated left shoulder CT. 6. Thoracic aortic aneurysm measuring up to 4.7 cm. Recommend semi-annual imaging followup by CTA or MRA and referral to cardiothoracic surgery if not already obtained. This recommendation follows 2010 ACCF/AHA/AATS/ACR/ASA/SCA/SCAI/SIR/STS/SVM Guidelines for the Diagnosis and Management of Patients With Thoracic Aortic Disease. Circulation. 2010; 121: P536-R443. Aortic aneurysm NOS (ICD10-I71.9) 7. Aortic and coronary artery atherosclerosis (ICD10-I70.0). Electronically Signed   By: Davina Poke D.O.   On: 01/16/2022 14:43   CT SHOULDER LEFT W CONTRAST  Result Date: 01/16/2022 CLINICAL DATA:  Dislocated left shoulder arthroplasty EXAM: CT OF THE UPPER LEFT EXTREMITY WITH CONTRAST TECHNIQUE: Multidetector CT imaging of the left shoulder was performed according to the standard protocol following intravenous contrast administration. RADIATION DOSE REDUCTION: This exam was performed according to the departmental dose-optimization program which includes automated exposure control, adjustment of the mA and/or  kV according to patient size and/or use of iterative reconstruction technique. CONTRAST:  57mL OMNIPAQUE IOHEXOL 350 MG/ML SOLN COMPARISON:  X-ray 01/15/2022, 01/25/2021.  CT 02/04/2021 FINDINGS: Bones/Joint/Cartilage Postsurgical changes from previous reverse left shoulder arthroplasty. The humeral component is anteromedially displaced relative to the glenoid component. Humeral component is perched along the anteroinferior aspect of the mid clavicle. There is a component of cortical thickening and callus formation along the left clavicular diaphysis, new from 02/04/2021 (series 8, image 19) raising the possibility of  subacute chronicity of dislocation. Components of the hardware appear intact. No periprosthetic lucency or fracture is evident within the limitations of the exam which is slightly degraded by metallic streak artifact. Ligaments Suboptimally assessed by CT. Muscles and Tendons Rotator cuff muscle atrophy. Soft tissues Large periprosthetic fluid collection surrounding the proximal humerus and extending throughout the left axillary region with maximal transverse measurements of approximately 7.8 x 4.4 cm (series 11, image 41) and extending approximately 8.8 cm in craniocaudal dimension (series 13, image 55). Visualized portion of the collection has a well-defined, slightly thickened wall. IMPRESSION: 1. Postsurgical changes from previous reverse left shoulder arthroplasty. The humeral component is anteromedially displaced relative to the glenoid component. Humeral component is perched along the anteroinferior aspect of the mid clavicle. 2. There is a component of cortical thickening and callus formation along the left clavicular diaphysis, new from 02/04/2021 raising the possibility of subacute chronicity of the dislocation. 3. Large periprosthetic fluid collection surrounding the proximal humerus and extending throughout the left axillary region measuring approximately 7.8 x 4.4 x 8.8 cm. Visualized portion of the collection has a well-defined, slightly thickened wall. Findings could represent sequela of postoperative seroma, hematoma, versus particle disease. Electronically Signed   By: Davina Poke D.O.   On: 01/16/2022 14:55   DG Shoulder Left  Result Date: 01/15/2022 CLINICAL DATA:  Fall, left shoulder pain EXAM: LEFT SHOULDER - 2+ VIEW COMPARISON:  None. FINDINGS: Surgical changes of left reverse total shoulder arthroplasty are identified. There is anterior dislocation of the humeral component in relation to the glenoid component. Moderate acromioclavicular degenerative arthritis noted. No acute fracture.  Limited evaluation of the left hemithorax is unremarkable. IMPRESSION: Anterior dislocation of the left reversed total shoulder arthroplasty. Electronically Signed   By: Fidela Salisbury M.D.   On: 01/15/2022 19:35   ECHOCARDIOGRAM COMPLETE  Result Date: 01/16/2022    ECHOCARDIOGRAM REPORT   Patient Name:   [name] Date of Exam: 01/16/2022 Medical Rec #:  [id number]       Height:       67.0 in Accession #:    [id number]      Weight:       190.0 lb Date of Birth:  08-30-1951       BSA:          1.979 m Patient Age:    71 years        BP:           110/73 mmHg Patient Gender: M               HR:           66 bpm. Exam Location:  Inpatient Procedure: 2D Echo, Cardiac Doppler, Color Doppler and Intracardiac            Opacification Agent Indications:    CHF-Acute Systolic N27.78  History:        Patient has prior history of Echocardiogram examinations, most  recent 07/02/2021. CAD, Prior CABG, Arrythmias:RBBB,                 Signs/Symptoms:Murmur; Risk Factors:Hypertension and                 Dyslipidemia. Past history of DVt and pulmonary embolism.  Sonographer:    Darlina Sicilian RDCS Referring Phys: [id number] [place] T TU  Sonographer Comments: Left arm resting across the patient chest in a sling. Unable to move left arm due to shoulder hematoma. IMPRESSIONS  1. Left ventricular ejection fraction, by estimation, is 35 to 40%. The left ventricle has moderately decreased function. The left ventricle demonstrates global hypokinesis. The left ventricular internal cavity size was mildly dilated. There is mild left ventricular hypertrophy. Left ventricular diastolic parameters are indeterminate.  2. Right ventricular systolic function is normal. The right ventricular size is normal. There is normal pulmonary artery systolic pressure.  3. Left atrial size was moderately dilated.  4. The mitral valve is normal in structure. Mild mitral valve regurgitation. No evidence of mitral stenosis.  5. Tricuspid valve  regurgitation is mild to moderate.  6. The aortic valve is tricuspid. Aortic valve regurgitation is mild. Aortic valve sclerosis is present, with no evidence of aortic valve stenosis.  7. Aortic dilatation noted. There is mild dilatation of the ascending aorta, measuring 43 mm.  8. The inferior vena cava is dilated in size with >50% respiratory variability, suggesting right atrial pressure of 8 mmHg. FINDINGS  Left Ventricle: Left ventricular ejection fraction, by estimation, is 35 to 40%. The left ventricle has moderately decreased function. The left ventricle demonstrates global hypokinesis. Definity contrast agent was given, it did not delineate the left ventricular endocardial borders. The left ventricular internal cavity size was mildly dilated. There is mild left ventricular hypertrophy. Left ventricular diastolic parameters are indeterminate. Right Ventricle: The right ventricular size is normal. Right ventricular systolic function is normal. There is normal pulmonary artery systolic pressure. The tricuspid regurgitant velocity is 2.50 m/s, and with an assumed right atrial pressure of 8 mmHg,  the estimated right ventricular systolic pressure is 45.4 mmHg. Left Atrium: Left atrial size was moderately dilated. Right Atrium: Right atrial size was normal in size. Pericardium: There is no evidence of pericardial effusion. Mitral Valve: The mitral valve is normal in structure. Mild mitral valve regurgitation. No evidence of mitral valve stenosis. Tricuspid Valve: The tricuspid valve is normal in structure. Tricuspid valve regurgitation is mild to moderate. No evidence of tricuspid stenosis. Aortic Valve: The aortic valve is tricuspid. Aortic valve regurgitation is mild. Aortic regurgitation PHT measures 379 msec. Aortic valve sclerosis is present, with no evidence of aortic valve stenosis. Pulmonic Valve: The pulmonic valve was normal in structure. Pulmonic valve regurgitation is mild. No evidence of pulmonic  stenosis. Aorta: Aortic dilatation noted. There is mild dilatation of the ascending aorta, measuring 43 mm. Venous: The inferior vena cava is dilated in size with greater than 50% respiratory variability, suggesting right atrial pressure of 8 mmHg. IAS/Shunts: No atrial level shunt detected by color flow Doppler.  LEFT VENTRICLE PLAX 2D LVIDd:         5.40 cm      Diastology LVIDs:         4.50 cm      LV e' medial:    7.09 cm/s LV PW:         1.20 cm      LV E/e' medial:  16.6 LV IVS:  1.20 cm      LV e' lateral:   9.64 cm/s LVOT diam:     2.30 cm      LV E/e' lateral: 12.2 LV SV:         65 LV SV Index:   33 LVOT Area:     4.15 cm  LV Volumes (MOD) LV vol d, MOD A2C: 221.0 ml LV vol d, MOD A4C: 167.0 ml LV vol s, MOD A2C: 122.0 ml LV vol s, MOD A4C: 111.0 ml LV SV MOD A2C:     99.0 ml LV SV MOD A4C:     167.0 ml LV SV MOD BP:      80.1 ml RIGHT VENTRICLE RV S prime:     6.83 cm/s TAPSE (M-mode): 1.3 cm LEFT ATRIUM             Index        RIGHT ATRIUM           Index LA diam:        4.70 cm 2.38 cm/m   RA Area:     20.40 cm LA Vol (A2C):   82.3 ml 41.59 ml/m  RA Volume:   60.70 ml  30.68 ml/m LA Vol (A4C):   87.6 ml 44.27 ml/m LA Biplane Vol: 86.4 ml 43.66 ml/m  AORTIC VALVE LVOT Vmax:   79.60 cm/s LVOT Vmean:  47.300 cm/s LVOT VTI:    0.156 m AI PHT:      379 msec  AORTA Ao Root diam: 3.65 cm Ao Asc diam:  4.30 cm MITRAL VALVE                TRICUSPID VALVE MV Area (PHT): 5.56 cm     TR Peak grad:   25.0 mmHg MV Decel Time: 137 msec     TR Vmax:        250.00 cm/s MV E velocity: 118.00 cm/s                             SHUNTS                             Systemic VTI:  0.16 m                             Systemic Diam: 2.30 cm Kirk Ruths MD Electronically signed by Kirk Ruths MD Signature Date/Time: 01/16/2022/11:04:44 AM    Final     Cardiac Studies   Echo 01/16/22: 1. Left ventricular ejection fraction, by estimation, is 35 to 40%. The  left ventricle has moderately decreased function.  The left ventricle  demonstrates global hypokinesis. The left ventricular internal cavity size  was mildly dilated. There is mild  left ventricular hypertrophy. Left ventricular diastolic parameters are  indeterminate.   2. Right ventricular systolic function is normal. The right ventricular  size is normal. There is normal pulmonary artery systolic pressure.   3. Left atrial size was moderately dilated.   4. The mitral valve is normal in structure. Mild mitral valve  regurgitation. No evidence of mitral stenosis.   5. Tricuspid valve regurgitation is mild to moderate.   6. The aortic valve is tricuspid. Aortic valve regurgitation is mild.  Aortic valve sclerosis is present, with no evidence of aortic valve  stenosis.   7. Aortic dilatation noted. There is mild  dilatation of the ascending  aorta, measuring 43 mm.   8. The inferior vena cava is dilated in size with >50% respiratory  variability, suggesting right atrial pressure of 8 mmHg.    Echo 07/02/21: 1.  Normal left ventricular size, generalized hypokinesis with reduced  ejection fraction of 30 to 35%  2.  Moderate concentric left ventricular hypertrophy  3.  Moderate to severe aortic regurgitation  4.  Enlarged aortic root, 4.27 cm  5.  Mild diastolic flow reversal demonstrated in the descending aorta   Nuclear stress test 09/08/21: Stress Function Comments  Left ventricular function post-stress is abnormal. Global function is mildly reduced. Post-stress ejection fraction is 46 %.   Patient Profile     71 y.o. male with a hx of CAD, s/p CABG, polysubstance abuse, prior falls, GERD, HTN, HLD, chronic systolic heart failure with moderate to severe AI, and RBBB who was seen 01/16/2022 for the evaluation of atrial flutter.  He last saw Dr. Johnsie Cancel in 2021.  In review of Care Everywhere, patient has struggled with alcohol abuse. He was seen 01/07/22 in [place] after a closed head injury - exact details are unclear. He was also seen  12/31/21 for alcohol withdrawal.   He was last seen by cardiology 12/23/21 in [place] [facility]) to establish care. He was maintained on spironolactone, 40 mg crestor, 81 mg ASA.  Nuclear stress test 08/2021 was nonischemic but did show an EF of 46%. I do not see a follow up echocardiogram.  Assessment & Plan    Atrial flutter with RVR - rates have been controlled - EP consulted yesterday and deemed him a poor candidate for catheter ablation and DCCV - continue to pursue rate control - he is on no rate controlling agents - pt was bradycardic overnight and cardiology asked to re-engage - telemetry reviewed - pauses up to 2.99 seconds appreciated - longest pause was 0359 this morning - I do not see sustained ventricular rates in the 20s - no change in management at this time, continue telemetry   Need for chronic anticoagulation - hx of large chest wall hematoma after a fall in 10/2021 requiring multiple I&Ds and wound vac in Jan - not a good candidate for [facility] given polysubstance abuse, falls, and homelessness - since he is not a good candidate for [facility], this limits his options for rhythm control   Acute on chronic systolic heart failure Moderate to severe aortic regurgitation on prior echo - now mild Aortic root 4.27 cm Medication nonadherence - echo 07/02/21 with LVEF 30-35% - nuclear stress test in 08/2021 estimated EF at 46% - echo obtained given new atrial flutter and showed LVEF 35-40%, moderately dilated left atrium, mild MR, mild to moderate TR - diuresing on 40 mg IV lasix BID with 2L urine output - he continues to diurese well - unclear what his home regimen was prior to coming in, although spironolactone was listed in a prior care everywhere note - has been off medications "for a month" - hold of on BB for now given pauses overnight - will need CM to help establish care with community health and wellness - BP in the 110s - was hypotensive yesterday - will hold off on adding  GDMT, will need to see how renal function progresses but would like to add ARB/spironolactone +/- hydralazine and long acting nitrates   AKI - sCr 1.28 (0.95) - continue diuresis, hold off on ARB and spirolactone for now - will need to see how renal  function trends   CAD s/p CABG x 4 (LIMA-LAD, SVG-D1, SVG-OM1-OM2, SVG-PDA) - 1998 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 - nonischemic nuclear stress test 09/08/21 with EF estimated at 46%   Chronically dislocated left shoulder - in 10/2021 - wound cultures with MRSA   Hx of PE Elevated D-dimer - CTA 02/06/21 with large burden of PE with central pulmonary artery distribution - pulmonology placed on eliquis, but now off - noted this was in the setting of lowe extremity cellulitis and minimal mobility - CTA this admission negative for PE - B LE swelling - appears to be volume and bilateral - but consider ruling out DVT   Thoracic aortic aneurysm - 4.7 cm on CTA yesterday - recommend repeat imiagin in 6-12 months   Disposition Alcohol abuse His brother and sister-in-law have refused to help the patient given history of alcohol abuse. He was discharged to a homeless shelter prior to leaving [place] followed last ER visit. - he is currently homeless, not living in a shelter - he states his last drink was 2 weeks ago      For questions or updates, please contact [place] Please consult [email or web address] for contact info under        Signed, Ledora Bottcher, PA  01/17/2022, 9:15 AM    Patient seen and examined.  Agree with above documentation.  On exam, patient is alert and oriented, regular rate and rhythm, no murmurs, lungs CTAB, 2+ BLE edema.  Telemetry reviewed, is in atrial flutter with rate 60s.  Did have pauses up to 3 seconds overnight.  Continue to avoid AV nodal blocking agents.  Continue IV Lasix.  Donato Heinz, MD

## 2022-01-17 NOTE — Plan of Care (Signed)

## 2022-01-17 NOTE — Procedures (Signed)
Interventional Radiology Procedure Note  Procedure: Image guided left shoulder aspiration  Findings: Please refer to procedural dictation for full description. 30 mL opaque, yellow fluid aspirated.  Complications: None immediate  Estimated Blood Loss: < 5 mL  Recommendations: Follow up culture.   Ruthann Cancer, MD Pager: [phone]

## 2022-01-17 NOTE — Progress Notes (Addendum)
Patient with episodes of nonsustained bradycardia upto low 20s asymptomatic.  Repeat EKG:    Asymptomatic Bradycardia HR in the low 30's, EKG as above afluter with 4:1 AV conduction  PMH: New onset atrial fibrillation/aflutter -TSH wnl -Monitor BMP+Mag and replace -Hold AV nodal drugs -Cardiology consult if appropriate of symptomatic     Jeremiah Falco, DNP, CCRN, FNP-C, AGACNP-BC Acute Care Nurse Practitioner  [facility] Pager: [phone] [place] [facility]

## 2022-01-17 NOTE — Progress Notes (Signed)
PROGRESS NOTE Jeremiah [name]  TKW:[id number] DOB: [birth year] DOA: 01/15/2022 PCP: Carol Ada, MD   Brief Narrative/Hospital Course: 71 y.o. male with medical history significant of CAD s/p bypass in 1998,Hx of PE and DVT on Eliquis, HTN, alcohol abuse with hx of withdrawal, opioid dependency previous on Suboxone presented with acute left shoulder and left neck pain.  Patient appeared to be a limited historian.Frustrated that questions are being asked.Has not been on any of his medications.Reportedly patient lost his wife last year and moved out to [place] to live with his brother. However did not get along with his sister-in-law and has recently moved back here to [place] last week and noted to be homeless and living in [place].Noted to have multiple admissions recently in [place] for alcohol withdrawal.  In 10/2021 he was admitted following a fall and was noted to have a large left-sided chest wall hematoma  had multiple I&D and wound VAC in January due to persistent refractory hematoma, found to have chronically dislocated left shoulder arthroplasty in the setting of likely infection. In Care Everywhere 02/06/2021-CTA showed large burden of thromboembolic disease with central pulmonary artery distribution.  Saw Pulm in office with Dr Janett Billow at [facility]- who noted 'it sounds like his pulmonary embolism was associated with some lower extremity cellulitis which he was hospitalized and minimally mobile. He was treated with Eliquis, now off."  11/15/21- was discharged from [facility] a fall with chest wall hematoma/blood loss anemia chronic dislocation of left shoulder-had multiple procedures back treatment- was discharged on oral doxycycline  In the ED, hemodynamically stable afebrile Labs with anemia of 9.1 with no recent prior for  comparison.stable BMP, EKG -atrial flutter and 4-1 conduction with RBBB,BNP of 498.  Troponin reassuring at 15-->17.Left  shoulder x-ray showing anterior dislocation of left reverse total shoulder arthroplasty.Chest x-ray with clear lungs./Cardiomegaly.Further labs with ESR 44 D-dimer 1.1 TSH 1.5 COVID flu negative alcohol less than 10 UDS positive for benzo. Patient was admitted with IV vancomycin after dose of Lasix.  12/26- s/pCTA-no PE, showed cardiomegaly with reflux of contrast into the IVC and hepatic vein suggesting right heart dysfunction, pulmonary artery hypertension, thoracic aortic aneurysm up to 4.7 cm advised semiannual imaging follow-up CT or MRI and referral to cardiothoracic surgery as outpatient.Seen by orthopedic surgeon in consultation had CT of left shoulder-showing postsurgical changes and dislocation, large periprosthetic fluid collection surrounding the proximal humerus and extending throughout the left axillary region up to 7.8 X4.4X 8.8-sequelae of postoperative seroma, hematoma versus particle disease. 12/26- Echo with new onset of reduced EF     Subjective: Seen and examined this morning.  Still complains of pain on his left shoulder No chest pain shortness of breath fever or chills. Overnight afebrile. Labs with slight bump in creatinine stopping Toradol  Assessment and Plan: * Shoulder wound, left, initial encounter Left shoulder pain Previous MRSA infection of shoulder Left shoulder reverse total shoulder arthroplasty anterior dislocation  Chronic with intermittent draining wound over left anterior shoulder: CT of left shoulder-showing postsurgical changes and dislocation, large periprosthetic fluid collection surrounding the proximal humerus and extending throughout the left axillary region up to 7.8 X4.4X 8.8-sequelae of postoperative seroma, hematoma versus particle disease. Per Dr. Ricardo Jericho is for aspiration of the left shoulder by IR to look for organism, recommending transfer to [facility] this week and planning for procedure at some point for removal of hardware,  irrigation/debridement and placement of antibiotic spacer with 68 weeks of IV antibiotic per ID Continue  oral pain regimen  Left shoulder pain See above  Shoulder dislocation, recurrent, left See above.   Atrial flutter ([place]) New onset a flutter in the ED Asymptomatic bradycardia during the night: TSH stable 1.5.Cardiology requested to be back on board.  Patient had a bradycardia event with pause up to 2.9 seconds 2/26 night.  Anticoagulation deferred to cardiology  Lower leg edema See above  Acute systolic CHF (congestive heart failure) (HCC) Acute systolic CHF with EF 35 to 40% appears to have new reduced EF.   Elevated BNP Lower leg edema: Patient reports he was on Bumex but has not been compliant for month, history of CHF but previous echo and TEE with normal LVEF echo done. CTA shows cardiomegaly with contrast in IVC and hepatic veins suggesting right heart dysfunction.seen by cardiology.Cont diuresis as blood pressure tolerates. Requested cardiology to follow along Net IO Since Admission: -3,100 mL [01/17/22 1107]  Filed Weights   01/16/22 0513 01/16/22 1017 01/17/22 0500  Weight: 86.2 kg 86.2 kg 85.2 kg    History of pulmonary embolism- (present on admission) See above  Elevated d-dimer History of previous PE: back in March 2022-was on eliquis and not currently-see HPI CTA-no PE, showed cardiomegaly with reflux of contrast into the IVC and hepatic vein suggesting right heart dysfunction, pulmonary artery hypertension.  Thoracic aortic aneurysm (TAA) CTA incidentally showed a 4.7 cm thoracic aortic aneurysm, on discharge he needs instruction for semiannual imaging follow-up CT or MRI and referral to cardiac surgery as outpatient.  Hypotension Transient low BP stable now.   B12 deficiency Cont replacement  Depression Mood stable  Alcohol abuse Cessation counseling, monitor withdrawal symptoms, currently homeless  Normocytic anemia Hemoglobin holding around 8  to 9 g.  Monitor Recent Labs  Lab 01/15/22 2130 01/16/22 0530  HGB 9.1* 8.9*  HCT 30.6* 29.8*    Prolonged QT interval Monitoring EKG and  tele  DVT prophylaxis: SCDs Start: 01/15/22 2347 Code Status:   Code Status: Full Code Family Communication: plan of care discussed with patient at bedside. Disposition: Currently not medically stable for discharge. Status is: Inpatient Remains inpatient appropriate because: Ongoing management left shoulder pain, ortho IR cardio eval.  Objective: Vitals last 24 hrs: Vitals:   01/16/22 2210 01/17/22 0500 01/17/22 0612 01/17/22 0813  BP: 106/79  116/76 118/72  Pulse: 65  66 65  Resp:   20 18  Temp: 97.8 F (36.6 C)  97.7 F (36.5 C)   TempSrc: Oral  Oral   SpO2: 100%  100% 100%  Weight:  85.2 kg    Height:       Weight change: 0 kg  Physical Examination: General exam: AA0X3, poor historian,older than stated age, weak appearing. HEENT:Oral mucosa moist, Ear/Nose WNL grossly, dentition normal. Respiratory system: bilaterally basal crackles,no use of accessory muscle Cardiovascular system: S1 & S2 +, No JVD,. Gastrointestinal system: Abdomen soft,NT,ND, BS+ Nervous System:Alert, awake, moving extremities and grossly nonfocal Extremities: Lower extremity edematous,distal peripheral pulses palpable.  Skin: No rashes,no icterus. MSK: Normal muscle bulk,tone, power, left shoulder with sling in place dressing in place on the draining sinus  Medications reviewed:  Scheduled Meds:  Chlorhexidine Gluconate Cloth  6 each Topical Q0600   furosemide  40 mg Intravenous Daily   melatonin  3 mg Oral QHS   mupirocin ointment  1 application Nasal BID   vitamin B-12  1,000 mcg Oral Daily   Continuous Infusions:  vancomycin 1,250 mg (01/16/22 2248)      Diet Order  Diet NPO time specified Except for: Sips with Meds  Diet effective now                            Intake/Output Summary (Last 24 hours) at 01/17/2022  1109 Last data filed at 01/17/2022 1047 Gross per 24 hour  Intake 1690 ml  Output 1600 ml  Net 90 ml   Net IO Since Admission: -3,100 mL [01/17/22 1109]  Wt Readings from Last 3 Encounters:  01/17/22 85.2 kg  10/02/20 115.7 kg  03/17/20 109.8 kg     Unresulted Labs (From admission, onward)     Start     Ordered   01/18/22 0500  Creatinine, serum  Daily,   R     Comments: Can discontinue lab order if vancomycin discontinued    01/17/22 1003   01/17/22 1102  Albumin  Once,   R        01/17/22 1101   01/17/22 1102  Prealbumin  Once,   R        01/17/22 1101           Data Reviewed: I have personally reviewed following labs and imaging studies CBC: Recent Labs  Lab 01/15/22 2130 01/16/22 0530  WBC 5.1 3.9*  NEUTROABS 3.4  --   HGB 9.1* 8.9*  HCT 30.6* 29.8*  MCV 88.2 87.9  PLT 238 778   Basic Metabolic Panel: Recent Labs  Lab 01/15/22 2130 01/17/22 0231  NA 137 134*  K 4.3 4.3  CL 108 107  CO2 20* 23  GLUCOSE 75 79  BUN 21 29*  CREATININE 0.95 1.28*  CALCIUM 8.5* 7.9*  MG  --  1.9   GFR: Estimated Creatinine Clearance: 56.8 mL/min (A) (by C-G formula based on SCr of 1.28 mg/dL (H)). Liver Function Tests: Recent Labs  Lab 01/15/22 2130  AST 22  ALT 16  ALKPHOS 95  BILITOT 0.9  PROT 6.1*  ALBUMIN 2.8*   No results for input(s): LIPASE, AMYLASE in the last 168 hours. No results for input(s): AMMONIA in the last 168 hours. Coagulation Profile: No results for input(s): INR, PROTIME in the last 168 hours. Cardiac Enzymes: No results for input(s): CKTOTAL, CKMB, CKMBINDEX, TROPONINI in the last 168 hours. BNP (last 3 results) No results for input(s): PROBNP in the last 8760 hours. HbA1C: No results for input(s): HGBA1C in the last 72 hours. CBG: No results for input(s): GLUCAP in the last 168 hours. Lipid Profile: No results for input(s): CHOL, HDL, LDLCALC, TRIG, CHOLHDL, LDLDIRECT in the last 72 hours. Thyroid Function Tests: Recent Labs     01/16/22 0255  TSH 1.588   Anemia Panel: Recent Labs    01/16/22 0255  VITAMINB12 176*  FOLATE 20.8  TIBC 283  IRON 24*   Sepsis Labs: No results for input(s): PROCALCITON, LATICACIDVEN in the last 168 hours.  Recent Results (from the past 240 hour(s))  Resp Panel by RT-PCR (Flu A&B, Covid) Nasopharyngeal Swab     Status: None   Collection Time: 01/15/22 11:38 PM   Specimen: Nasopharyngeal Swab; Nasopharyngeal(NP) swabs in vial transport medium  Result Value Ref Range Status   SARS Coronavirus 2 by RT PCR NEGATIVE NEGATIVE Final    Comment: (NOTE) SARS-CoV-2 target nucleic acids are NOT DETECTED.  The SARS-CoV-2 RNA is generally detectable in upper respiratory specimens during the acute phase of infection. The lowest concentration of SARS-CoV-2 viral copies this assay can detect is 138 copies/mL. A  negative result does not preclude SARS-Cov-2 infection and should not be used as the sole basis for treatment or other patient management decisions. A negative result may occur with  improper specimen collection/handling, submission of specimen other than nasopharyngeal swab, presence of viral mutation(s) within the areas targeted by this assay, and inadequate number of viral copies(<138 copies/mL). A negative result must be combined with clinical observations, patient history, and epidemiological information. The expected result is Negative.  Fact Sheet for Patients:  EntrepreneurPulse.com.au  Fact Sheet for Healthcare Providers:  IncredibleEmployment.be  This test is no t yet approved or cleared by the [place] FDA and  has been authorized for detection and/or diagnosis of SARS-CoV-2 by FDA under an Emergency Use Authorization (EUA). This EUA will remain  in effect (meaning this test can be used) for the duration of the COVID-19 declaration under Section 564(b)(1) of the Act, 21 U.S.C.section 360bbb-3(b)(1), unless the  authorization is terminated  or revoked sooner.       Influenza A by PCR NEGATIVE NEGATIVE Final   Influenza B by PCR NEGATIVE NEGATIVE Final    Comment: (NOTE) The Xpert Xpress SARS-CoV-2/FLU/RSV plus assay is intended as an aid in the diagnosis of influenza from Nasopharyngeal swab specimens and should not be used as a sole basis for treatment. Nasal washings and aspirates are unacceptable for Xpert Xpress SARS-CoV-2/FLU/RSV testing.  Fact Sheet for Patients: EntrepreneurPulse.com.au  Fact Sheet for Healthcare Providers: IncredibleEmployment.be  This test is not yet approved or cleared by the [place] FDA and has been authorized for detection and/or diagnosis of SARS-CoV-2 by FDA under an Emergency Use Authorization (EUA). This EUA will remain in effect (meaning this test can be used) for the duration of the COVID-19 declaration under Section 564(b)(1) of the Act, 21 U.S.C. section 360bbb-3(b)(1), unless the authorization is terminated or revoked.  Performed at [facility], [place] [address] [place], [place] [address]   MRSA Next Gen by PCR, Nasal     Status: Abnormal   Collection Time: 01/16/22 12:47 AM   Specimen: Nasal Mucosa; Nasal Swab  Result Value Ref Range Status   MRSA by PCR Next Gen DETECTED (A) NOT DETECTED Final    Comment: (NOTE) The GeneXpert MRSA Assay (FDA approved for NASAL specimens only), is one component of a comprehensive MRSA colonization surveillance program. It is not intended to diagnose MRSA infection nor to guide or monitor treatment for MRSA infections. Test performance is not FDA approved in patients less than 60 years old. Performed at [facility], [place] [address], [place], [place] [address]     Antimicrobials: Anti-infectives (From admission, onward)    Start     Dose/Rate Route Frequency Ordered Stop   01/16/22 2200  vancomycin (VANCOREADY) IVPB 1250 mg/250  mL        1,250 mg 166.7 mL/hr over 90 Minutes Intravenous Every 24 hours 01/16/22 0107     01/16/22 0030  vancomycin (VANCOREADY) IVPB 1500 mg/300 mL        1,500 mg 150 mL/hr over 120 Minutes Intravenous  Once 01/16/22 0018 01/16/22 0240   01/16/22 0015  vancomycin (VANCOREADY) IVPB 2000 mg/400 mL  Status:  Discontinued        2,000 mg 200 mL/hr over 120 Minutes Intravenous  Once 01/16/22 0001 01/16/22 0018      Culture/Microbiology    Component Value Date/Time   SDES URINE, CLEAN CATCH 02/20/2018 1205   SPECREQUEST NONE 02/20/2018 1205   CULT (A) 02/20/2018 1205    <  10,000 COLONIES/mL INSIGNIFICANT GROWTH Performed at [facility], [place] [address], [place], [place] [address]    REPTSTATUS 02/21/2018 FINAL 02/20/2018 1205    Other culture-see note  Radiology Studies: DG Chest 2 View  Result Date: 01/15/2022 CLINICAL DATA:  Fall, chest pain EXAM: CHEST - 2 VIEW COMPARISON:  03/29/2021 FINDINGS: Lungs are clear. No pneumothorax or pleural effusion. Coronary artery bypass grafting has been performed. Cardiomegaly is stable. Pulmonary vascularity is normal. Left reversed total shoulder arthroplasty has been performed with anteromedial dislocation of the humeral component. IMPRESSION: Left reversed total shoulder arthroplasty anteromedial dislocation. Stable cardiomegaly. Electronically Signed   By: Fidela Salisbury M.D.   On: 01/15/2022 19:36   CT Angio Chest Pulmonary Embolism (PE) W or WO Contrast  Result Date: 01/16/2022 CLINICAL DATA:  Fall, chest pain. Pulmonary embolism (PE) suspected, positive D-dimer. History of PE EXAM: CT ANGIOGRAPHY CHEST WITH CONTRAST TECHNIQUE: Multidetector CT imaging of the chest was performed using the standard protocol during bolus administration of intravenous contrast. Multiplanar CT image reconstructions and MIPs were obtained to evaluate the vascular anatomy. RADIATION DOSE REDUCTION: This exam was performed according to the departmental  dose-optimization program which includes automated exposure control, adjustment of the mA and/or kV according to patient size and/or use of iterative reconstruction technique. CONTRAST:  32m OMNIPAQUE IOHEXOL 350 MG/ML SOLN COMPARISON:  04/06/2021 FINDINGS: Cardiovascular: Satisfactory opacification of the pulmonary arteries to the segmental level. Evaluation of the more distal arterial branches is degraded by respiratory motion artifact. Within this limitation, there is no evidence of pulmonary embolism. Main pulmonary trunk measures 3.8 cm in diameter. Mid ascending thoracic aorta measures 4.7 cm in diameter. Scattered atherosclerotic calcifications of the aorta and coronary arteries. Cardiomegaly. Previous CABG. No pericardial effusion. Mediastinum/Nodes: No enlarged mediastinal, hilar, or axillary lymph nodes. Thyroid gland, trachea, and esophagus demonstrate no significant findings. Lungs/Pleura: Trace bilateral pleural effusions with associated bibasilar subsegmental atelectasis. Previously seen right upper lobe pulmonary nodule has resolved. No pneumothorax. Upper Abdomen: Reflux of contrast into the IVC and hepatic veins. Postsurgical changes at the gastroesophageal junction. No acute findings are evident within the included upper abdomen. Musculoskeletal: Dislocated left shoulder arthroplasty. Additional findings involving the left shoulder will be dictated on dedicated left shoulder CT. Severe osteoarthritis of the right shoulder with associated joint effusion. Degenerative changes of the thoracic spine without fracture. Review of the MIP images confirms the above findings. IMPRESSION: 1. Negative for pulmonary embolism. 2. Trace bilateral pleural effusions with associated bibasilar subsegmental atelectasis. 3. Cardiomegaly with reflux of contrast into the IVC and hepatic veins, suggesting right heart dysfunction. 4. Dilated main pulmonary trunk suggesting pulmonary arterial hypertension. 5. Dislocated  left shoulder arthroplasty with additional findings involving the left shoulder will be dictated on dedicated left shoulder CT. 6. Thoracic aortic aneurysm measuring up to 4.7 cm. Recommend semi-annual imaging followup by CTA or MRA and referral to cardiothoracic surgery if not already obtained. This recommendation follows 2010 ACCF/AHA/AATS/ACR/ASA/SCA/SCAI/SIR/STS/SVM Guidelines for the Diagnosis and Management of Patients With Thoracic Aortic Disease. Circulation. 2010; 121:: H741-U384 Aortic aneurysm NOS (ICD10-I71.9) 7. Aortic and coronary artery atherosclerosis (ICD10-I70.0). Electronically Signed   By: NDavina PokeD.O.   On: 01/16/2022 14:43   CT SHOULDER LEFT W CONTRAST  Result Date: 01/16/2022 CLINICAL DATA:  Dislocated left shoulder arthroplasty EXAM: CT OF THE UPPER LEFT EXTREMITY WITH CONTRAST TECHNIQUE: Multidetector CT imaging of the left shoulder was performed according to the standard protocol following intravenous contrast administration. RADIATION DOSE REDUCTION: This exam was performed according  to the departmental dose-optimization program which includes automated exposure control, adjustment of the mA and/or kV according to patient size and/or use of iterative reconstruction technique. CONTRAST:  53m OMNIPAQUE IOHEXOL 350 MG/ML SOLN COMPARISON:  X-ray 01/15/2022, 01/25/2021.  CT 02/04/2021 FINDINGS: Bones/Joint/Cartilage Postsurgical changes from previous reverse left shoulder arthroplasty. The humeral component is anteromedially displaced relative to the glenoid component. Humeral component is perched along the anteroinferior aspect of the mid clavicle. There is a component of cortical thickening and callus formation along the left clavicular diaphysis, new from 02/04/2021 (series 8, image 19) raising the possibility of subacute chronicity of dislocation. Components of the hardware appear intact. No periprosthetic lucency or fracture is evident within the limitations of the exam which  is slightly degraded by metallic streak artifact. Ligaments Suboptimally assessed by CT. Muscles and Tendons Rotator cuff muscle atrophy. Soft tissues Large periprosthetic fluid collection surrounding the proximal humerus and extending throughout the left axillary region with maximal transverse measurements of approximately 7.8 x 4.4 cm (series 11, image 41) and extending approximately 8.8 cm in craniocaudal dimension (series 13, image 55). Visualized portion of the collection has a well-defined, slightly thickened wall. IMPRESSION: 1. Postsurgical changes from previous reverse left shoulder arthroplasty. The humeral component is anteromedially displaced relative to the glenoid component. Humeral component is perched along the anteroinferior aspect of the mid clavicle. 2. There is a component of cortical thickening and callus formation along the left clavicular diaphysis, new from 02/04/2021 raising the possibility of subacute chronicity of the dislocation. 3. Large periprosthetic fluid collection surrounding the proximal humerus and extending throughout the left axillary region measuring approximately 7.8 x 4.4 x 8.8 cm. Visualized portion of the collection has a well-defined, slightly thickened wall. Findings could represent sequela of postoperative seroma, hematoma, versus particle disease. Electronically Signed   By: NDavina PokeD.O.   On: 01/16/2022 14:55   DG Shoulder Left  Result Date: 01/15/2022 CLINICAL DATA:  Fall, left shoulder pain EXAM: LEFT SHOULDER - 2+ VIEW COMPARISON:  None. FINDINGS: Surgical changes of left reverse total shoulder arthroplasty are identified. There is anterior dislocation of the humeral component in relation to the glenoid component. Moderate acromioclavicular degenerative arthritis noted. No acute fracture. Limited evaluation of the left hemithorax is unremarkable. IMPRESSION: Anterior dislocation of the left reversed total shoulder arthroplasty. Electronically Signed    By: AFidela SalisburyM.D.   On: 01/15/2022 19:35   ECHOCARDIOGRAM COMPLETE  Result Date: 01/16/2022    ECHOCARDIOGRAM REPORT   Patient Name:   MEUELL SCHIFFDate of Exam: 01/16/2022 Medical Rec #:  [id number]      Height:       67.0 in Accession #:    [id number]     Weight:       190.0 lb Date of Birth:  8December 08, 1952      BSA:          1.979 m Patient Age:    89years        BP:           110/73 mmHg Patient Gender: M               HR:           66 bpm. Exam Location:  Inpatient Procedure: 2D Echo, Cardiac Doppler, Color Doppler and Intracardiac            Opacification Agent Indications:    CHF-Acute Systolic IK56.25 History:        Patient  has prior history of Echocardiogram examinations, most                 recent 07/02/2021. CAD, Prior CABG, Arrythmias:RBBB,                 Signs/Symptoms:Murmur; Risk Factors:Hypertension and                 Dyslipidemia. Past history of DVt and pulmonary embolism.  Sonographer:    Darlina Sicilian RDCS Referring Phys: [id number] [place] T TU  Sonographer Comments: Left arm resting across the patient chest in a sling. Unable to move left arm due to shoulder hematoma. IMPRESSIONS  1. Left ventricular ejection fraction, by estimation, is 35 to 40%. The left ventricle has moderately decreased function. The left ventricle demonstrates global hypokinesis. The left ventricular internal cavity size was mildly dilated. There is mild left ventricular hypertrophy. Left ventricular diastolic parameters are indeterminate.  2. Right ventricular systolic function is normal. The right ventricular size is normal. There is normal pulmonary artery systolic pressure.  3. Left atrial size was moderately dilated.  4. The mitral valve is normal in structure. Mild mitral valve regurgitation. No evidence of mitral stenosis.  5. Tricuspid valve regurgitation is mild to moderate.  6. The aortic valve is tricuspid. Aortic valve regurgitation is mild. Aortic valve sclerosis is present, with no evidence of  aortic valve stenosis.  7. Aortic dilatation noted. There is mild dilatation of the ascending aorta, measuring 43 mm.  8. The inferior vena cava is dilated in size with >50% respiratory variability, suggesting right atrial pressure of 8 mmHg. FINDINGS  Left Ventricle: Left ventricular ejection fraction, by estimation, is 35 to 40%. The left ventricle has moderately decreased function. The left ventricle demonstrates global hypokinesis. Definity contrast agent was given, it did not delineate the left ventricular endocardial borders. The left ventricular internal cavity size was mildly dilated. There is mild left ventricular hypertrophy. Left ventricular diastolic parameters are indeterminate. Right Ventricle: The right ventricular size is normal. Right ventricular systolic function is normal. There is normal pulmonary artery systolic pressure. The tricuspid regurgitant velocity is 2.50 m/s, and with an assumed right atrial pressure of 8 mmHg,  the estimated right ventricular systolic pressure is 96.7 mmHg. Left Atrium: Left atrial size was moderately dilated. Right Atrium: Right atrial size was normal in size. Pericardium: There is no evidence of pericardial effusion. Mitral Valve: The mitral valve is normal in structure. Mild mitral valve regurgitation. No evidence of mitral valve stenosis. Tricuspid Valve: The tricuspid valve is normal in structure. Tricuspid valve regurgitation is mild to moderate. No evidence of tricuspid stenosis. Aortic Valve: The aortic valve is tricuspid. Aortic valve regurgitation is mild. Aortic regurgitation PHT measures 379 msec. Aortic valve sclerosis is present, with no evidence of aortic valve stenosis. Pulmonic Valve: The pulmonic valve was normal in structure. Pulmonic valve regurgitation is mild. No evidence of pulmonic stenosis. Aorta: Aortic dilatation noted. There is mild dilatation of the ascending aorta, measuring 43 mm. Venous: The inferior vena cava is dilated in size with  greater than 50% respiratory variability, suggesting right atrial pressure of 8 mmHg. IAS/Shunts: No atrial level shunt detected by color flow Doppler.  LEFT VENTRICLE PLAX 2D LVIDd:         5.40 cm      Diastology LVIDs:         4.50 cm      LV e' medial:    7.09 cm/s LV PW:  1.20 cm      LV E/e' medial:  16.6 LV IVS:        1.20 cm      LV e' lateral:   9.64 cm/s LVOT diam:     2.30 cm      LV E/e' lateral: 12.2 LV SV:         65 LV SV Index:   33 LVOT Area:     4.15 cm  LV Volumes (MOD) LV vol d, MOD A2C: 221.0 ml LV vol d, MOD A4C: 167.0 ml LV vol s, MOD A2C: 122.0 ml LV vol s, MOD A4C: 111.0 ml LV SV MOD A2C:     99.0 ml LV SV MOD A4C:     167.0 ml LV SV MOD BP:      80.1 ml RIGHT VENTRICLE RV S prime:     6.83 cm/s TAPSE (M-mode): 1.3 cm LEFT ATRIUM             Index        RIGHT ATRIUM           Index LA diam:        4.70 cm 2.38 cm/m   RA Area:     20.40 cm LA Vol (A2C):   82.3 ml 41.59 ml/m  RA Volume:   60.70 ml  30.68 ml/m LA Vol (A4C):   87.6 ml 44.27 ml/m LA Biplane Vol: 86.4 ml 43.66 ml/m  AORTIC VALVE LVOT Vmax:   79.60 cm/s LVOT Vmean:  47.300 cm/s LVOT VTI:    0.156 m AI PHT:      379 msec  AORTA Ao Root diam: 3.65 cm Ao Asc diam:  4.30 cm MITRAL VALVE                TRICUSPID VALVE MV Area (PHT): 5.56 cm     TR Peak grad:   25.0 mmHg MV Decel Time: 137 msec     TR Vmax:        250.00 cm/s MV E velocity: 118.00 cm/s                             SHUNTS                             Systemic VTI:  0.16 m                             Systemic Diam: 2.30 cm Kirk Ruths MD Electronically signed by Kirk Ruths MD Signature Date/Time: 01/16/2022/11:04:44 AM    Final      LOS: 2 days   Antonieta Pert, MD [facility]  01/17/2022, 11:09 AM

## 2022-01-17 NOTE — Progress Notes (Addendum)
Notified on call provider about patient having two episodes of HR going down to the upper 30's and HR going down to 28. On call provider gave new orders to get a STAT 12-lead-EKG, which paper copy was placed in patient's shadow chart, as well as labs. Checked on patient each time, and patient was asymptomatic.   Completed MRSA PCR swab per order. MRSA PCR came back positive. Initiated Standing Order for MRSA Positive PCR and applied appropriate contact isolation caddy outside the patient's door.

## 2022-01-17 NOTE — Assessment & Plan Note (Addendum)
CTA incidentally showed a 4.7 cm thoracic aortic aneurysm, will need semiannual CT or MRI imaging/cardiothoracic surgery follow-up as outpatient

## 2022-01-17 NOTE — Progress Notes (Signed)
Transfer order placed for transfer to [place] with planned left shoulder procedure by Dr Marlou Sa tomorrow evening.  NPO after 9AM tomorrow. Discontinued Lovenox that was ordered for 0600 tomorrow.

## 2022-01-17 NOTE — Progress Notes (Signed)
Patient evaluated today. History of fall 3 months ago with anterior shoulder dislocation at that time. No problems with his shoulder postoperatviely until that point.  He also has a draining sinus noted to the superoanterior aspect of the shoulder that first appeared 1 month ago.  Deltoid functional on exam today.  Obvious deformity noted with anterior disclocation.  Distal motor function to the operative arm is intact and 2+ radial pulse noted.  Plan is to see if the aspiration from his shoulder joint today will grow any organism.  Transfer patient over to [facility] this week and he will need procedure at some point this week which will be removal of hardware, irrigation/debridement, and placement of antibiotic spacer with 6-8 weeks of IV abx per ID to follow.    Need medical opinion as to the necessity of anticoagulation with his history of pulmonary embolus and if this is needed, okay for Lovenox.

## 2022-01-18 ENCOUNTER — Inpatient Hospital Stay (HOSPITAL_COMMUNITY): Payer: Medicare Other

## 2022-01-18 ENCOUNTER — Inpatient Hospital Stay (HOSPITAL_COMMUNITY): Payer: Medicare Other | Admitting: Anesthesiology

## 2022-01-18 ENCOUNTER — Other Ambulatory Visit: Payer: Self-pay | Admitting: Surgical

## 2022-01-18 ENCOUNTER — Encounter (HOSPITAL_COMMUNITY)
Admission: EM | Disposition: A | Discharge status: Skilled Nursing Facility | Payer: Self-pay | Source: Home / Self Care | Attending: Internal Medicine

## 2022-01-18 ENCOUNTER — Encounter (HOSPITAL_COMMUNITY): Payer: Self-pay | Admitting: Family Medicine

## 2022-01-18 ENCOUNTER — Inpatient Hospital Stay: Payer: Self-pay

## 2022-01-18 DIAGNOSIS — Z96619 Presence of unspecified artificial shoulder joint: Secondary | ICD-10-CM | POA: Diagnosis not present

## 2022-01-18 DIAGNOSIS — I5043 Acute on chronic combined systolic (congestive) and diastolic (congestive) heart failure: Secondary | ICD-10-CM

## 2022-01-18 DIAGNOSIS — I509 Heart failure, unspecified: Secondary | ICD-10-CM | POA: Diagnosis not present

## 2022-01-18 DIAGNOSIS — I2510 Atherosclerotic heart disease of native coronary artery without angina pectoris: Secondary | ICD-10-CM | POA: Diagnosis present

## 2022-01-18 DIAGNOSIS — I110 Hypertensive heart disease with heart failure: Secondary | ICD-10-CM

## 2022-01-18 DIAGNOSIS — S41002A Unspecified open wound of left shoulder, initial encounter: Secondary | ICD-10-CM | POA: Diagnosis not present

## 2022-01-18 DIAGNOSIS — T8459XA Infection and inflammatory reaction due to other internal joint prosthesis, initial encounter: Secondary | ICD-10-CM

## 2022-01-18 DIAGNOSIS — I4892 Unspecified atrial flutter: Secondary | ICD-10-CM | POA: Diagnosis not present

## 2022-01-18 HISTORY — PX: EXCISIONAL TOTAL SHOULDER ARTHROPLASTY WITH ANTIBIOTIC SPACER: SHX6264

## 2022-01-18 LAB — CBC
HCT: 31.1 % — ABNORMAL LOW (ref 39.0–52.0)
Hemoglobin: 9.1 g/dL — ABNORMAL LOW (ref 13.0–17.0)
MCH: 26 pg (ref 26.0–34.0)
MCHC: 29.3 g/dL — ABNORMAL LOW (ref 30.0–36.0)
MCV: 88.9 fL (ref 80.0–100.0)
Platelets: 275 10*3/uL (ref 150–400)
RBC: 3.5 MIL/uL — ABNORMAL LOW (ref 4.22–5.81)
RDW: 17.7 % — ABNORMAL HIGH (ref 11.5–15.5)
WBC: 10 10*3/uL (ref 4.0–10.5)
nRBC: 0 % (ref 0.0–0.2)

## 2022-01-18 LAB — TYPE AND SCREEN
ABO/RH(D): A POS
Antibody Screen: NEGATIVE

## 2022-01-18 LAB — BASIC METABOLIC PANEL
Anion gap: 5 (ref 5–15)
BUN: 27 mg/dL — ABNORMAL HIGH (ref 8–23)
CO2: 24 mmol/L (ref 22–32)
Calcium: 7.9 mg/dL — ABNORMAL LOW (ref 8.9–10.3)
Chloride: 106 mmol/L (ref 98–111)
Creatinine, Ser: 0.89 mg/dL (ref 0.61–1.24)
GFR, Estimated: >60 mL/min (ref >60)
Glucose, Bld: 138 mg/dL — ABNORMAL HIGH (ref 70–99)
Potassium: 4 mmol/L (ref 3.5–5.1)
Sodium: 135 mmol/L (ref 135–145)

## 2022-01-18 LAB — CREATININE, SERUM
Creatinine, Ser: 0.96 mg/dL (ref 0.61–1.24)
Creatinine, Ser: 0.91 mg/dL (ref 0.61–1.24)
GFR, Estimated: >60 mL/min (ref >60)
GFR, Estimated: >60 mL/min (ref >60)

## 2022-01-18 LAB — MAGNESIUM: Magnesium: 1.7 mg/dL (ref 1.7–2.4)

## 2022-01-18 SURGERY — REMOVAL, HARDWARE, SHOULDER, WITH IRRIGATION, DEBRIDEMENT, AND INSERTION OF ANTIBIOTIC BEADS OR ANTIBIOTIC SPACER
Anesthesia: General | Site: Shoulder | Laterality: Left

## 2022-01-18 MED ORDER — MIDAZOLAM HCL 2 MG/2ML IJ SOLN: 2.0000 mg | Freq: Once | INTRAMUSCULAR | Status: AC

## 2022-01-18 MED ORDER — VANCOMYCIN HCL 1000 MG IV SOLR
INTRAVENOUS | Status: DC | PRN
  Administered 2022-01-18 (×2): 1000 mg

## 2022-01-18 MED ORDER — HYDROXYZINE HCL 25 MG PO TABS
25.0000 mg | ORAL_TABLET | Freq: Three times a day (TID) | ORAL | Status: DC
Start: 2022-01-18 — End: 2022-02-28
  Administered 2022-01-18 – 2022-02-28 (×122): 25 mg via ORAL
  Filled 2022-01-18 (×124): qty 1

## 2022-01-18 MED ORDER — SODIUM CHLORIDE 0.9 % IR SOLN
Status: DC | PRN
  Administered 2022-01-18: 9000 mL

## 2022-01-18 MED ORDER — FENTANYL CITRATE (PF) 100 MCG/2ML IJ SOLN
25.0000 ug | INTRAMUSCULAR | Status: DC | PRN
  Administered 2022-01-18: 25 ug via INTRAVENOUS
  Administered 2022-01-18: 50 ug via INTRAVENOUS

## 2022-01-18 MED ORDER — BUPIVACAINE-EPINEPHRINE (PF) 0.5% -1:200000 IJ SOLN
INTRAMUSCULAR | Status: DC | PRN
  Administered 2022-01-18: 10 mL via PERINEURAL

## 2022-01-18 MED ORDER — LACTATED RINGERS IV SOLN: INTRAVENOUS | Status: DC | PRN

## 2022-01-18 MED ORDER — MAGNESIUM OXIDE -MG SUPPLEMENT 400 (240 MG) MG PO TABS: 400.0000 mg | ORAL_TABLET | Freq: Once | ORAL | Status: DC

## 2022-01-18 MED ORDER — ALPRAZOLAM 0.25 MG PO TABS
1.0000 mg | ORAL_TABLET | Freq: Three times a day (TID) | ORAL | Status: DC | PRN
  Administered 2022-01-19 – 2022-02-01 (×26): 1 mg via ORAL
  Filled 2022-01-18 (×26): qty 4

## 2022-01-18 MED ORDER — MENTHOL 3 MG MT LOZG: 1.0000 | LOZENGE | OROMUCOSAL | Status: DC | PRN

## 2022-01-18 MED ORDER — ALLOPURINOL 100 MG PO TABS
100.0000 mg | ORAL_TABLET | Freq: Every day | ORAL | Status: DC
  Administered 2022-01-19 – 2022-03-08 (×49): 100 mg via ORAL
  Filled 2022-01-18 (×49): qty 1

## 2022-01-18 MED ORDER — BUSPIRONE HCL 15 MG PO TABS
15.0000 mg | ORAL_TABLET | Freq: Three times a day (TID) | ORAL | Status: DC
  Administered 2022-01-18 – 2022-03-08 (×146): 15 mg via ORAL
  Filled 2022-01-18 (×131): qty 1
  Filled 2022-01-18: qty 3
  Filled 2022-01-18 (×15): qty 1

## 2022-01-18 MED ORDER — SODIUM CHLORIDE 0.9 % IV SOLN: INTRAVENOUS | Status: DC

## 2022-01-18 MED ORDER — ROCURONIUM BROMIDE 10 MG/ML (PF) SYRINGE
PREFILLED_SYRINGE | INTRAVENOUS | Status: DC | PRN
  Administered 2022-01-18: 60 mg via INTRAVENOUS

## 2022-01-18 MED ORDER — CHLORHEXIDINE GLUCONATE 0.12 % MT SOLN
15.0000 mL | OROMUCOSAL | Status: AC
  Filled 2022-01-18: qty 15

## 2022-01-18 MED ORDER — ASPIRIN EC 81 MG PO TBEC
81.0000 mg | DELAYED_RELEASE_TABLET | Freq: Every day | ORAL | Status: DC
  Administered 2022-01-19 – 2022-03-08 (×49): 81 mg via ORAL
  Filled 2022-01-18 (×49): qty 1

## 2022-01-18 MED ORDER — ROSUVASTATIN CALCIUM 20 MG PO TABS
40.0000 mg | ORAL_TABLET | Freq: Every day | ORAL | Status: DC
  Administered 2022-01-18 – 2022-03-08 (×50): 40 mg via ORAL
  Filled 2022-01-18 (×50): qty 2

## 2022-01-18 MED ORDER — GABAPENTIN 300 MG PO CAPS
300.0000 mg | ORAL_CAPSULE | Freq: Two times a day (BID) | ORAL | Status: DC
Start: 2022-01-18 — End: 2022-01-28
  Administered 2022-01-18 – 2022-01-27 (×19): 300 mg via ORAL
  Filled 2022-01-18 (×20): qty 1

## 2022-01-18 MED ORDER — PHENYLEPHRINE 40 MCG/ML (10ML) SYRINGE FOR IV PUSH (FOR BLOOD PRESSURE SUPPORT)
PREFILLED_SYRINGE | INTRAVENOUS | Status: DC | PRN
  Administered 2022-01-18: 320 ug via INTRAVENOUS
  Administered 2022-01-18: 160 ug via INTRAVENOUS
  Administered 2022-01-18: 80 ug via INTRAVENOUS
  Administered 2022-01-18: 40 ug via INTRAVENOUS
  Administered 2022-01-18: 80 ug via INTRAVENOUS

## 2022-01-18 MED ORDER — ZOLPIDEM TARTRATE 5 MG PO TABS
5.0000 mg | ORAL_TABLET | Freq: Once | ORAL | Status: AC
  Administered 2022-01-19: 5 mg via ORAL
  Filled 2022-01-18: qty 1

## 2022-01-18 MED ORDER — OXYCODONE HCL 5 MG PO TABS: 5.0000 mg | ORAL_TABLET | Freq: Once | ORAL | Status: DC | PRN

## 2022-01-18 MED ORDER — CHLORHEXIDINE GLUCONATE 4 % EX LIQD
60.0000 mL | Freq: Once | CUTANEOUS | Status: AC
  Administered 2022-01-18: 4 via TOPICAL
  Filled 2022-01-18: qty 60

## 2022-01-18 MED ORDER — EPHEDRINE SULFATE-NACL 50-0.9 MG/10ML-% IV SOSY
PREFILLED_SYRINGE | INTRAVENOUS | Status: DC | PRN
Start: 2022-01-18 — End: 2022-01-18
  Administered 2022-01-18 (×4): 5 mg via INTRAVENOUS

## 2022-01-18 MED ORDER — BUPROPION HCL ER (XL) 150 MG PO TB24
300.0000 mg | ORAL_TABLET | Freq: Every morning | ORAL | Status: DC
  Administered 2022-01-18 – 2022-03-08 (×50): 300 mg via ORAL
  Filled 2022-01-18 (×19): qty 2
  Filled 2022-01-18: qty 1
  Filled 2022-01-18 (×27): qty 2

## 2022-01-18 MED ORDER — METOCLOPRAMIDE HCL 5 MG/ML IJ SOLN
5.0000 mg | Freq: Three times a day (TID) | INTRAMUSCULAR | Status: DC | PRN
  Administered 2022-01-19 – 2022-02-28 (×5): 10 mg via INTRAVENOUS
  Filled 2022-01-18 (×5): qty 2

## 2022-01-18 MED ORDER — 0.9 % SODIUM CHLORIDE (POUR BTL) OPTIME
TOPICAL | Status: DC | PRN
Start: 2022-01-18 — End: 2022-01-18
  Administered 2022-01-18: 1000 mL

## 2022-01-18 MED ORDER — FENTANYL CITRATE (PF) 100 MCG/2ML IJ SOLN
50.0000 ug | Freq: Once | INTRAMUSCULAR | Status: DC
Start: 2022-01-18 — End: 2022-01-18

## 2022-01-18 MED ORDER — OXYCODONE HCL 5 MG/5ML PO SOLN: 5.0000 mg | Freq: Once | ORAL | Status: DC | PRN

## 2022-01-18 MED ORDER — PHENOL 1.4 % MT LIQD: 1.0000 | OROMUCOSAL | Status: DC | PRN

## 2022-01-18 MED ORDER — SUGAMMADEX SODIUM 200 MG/2ML IV SOLN
INTRAVENOUS | Status: DC | PRN
  Administered 2022-01-18: 200 mg via INTRAVENOUS

## 2022-01-18 MED ORDER — FENTANYL CITRATE (PF) 100 MCG/2ML IJ SOLN
INTRAMUSCULAR | Status: AC
  Filled 2022-01-18: qty 2

## 2022-01-18 MED ORDER — METOCLOPRAMIDE HCL 5 MG PO TABS: 5.0000 mg | ORAL_TABLET | Freq: Three times a day (TID) | ORAL | Status: DC | PRN

## 2022-01-18 MED ORDER — IRRISEPT - 450ML BOTTLE WITH 0.05% CHG IN STERILE WATER, USP 99.95% OPTIME
TOPICAL | Status: DC | PRN
  Administered 2022-01-18: 450 mL via TOPICAL

## 2022-01-18 MED ORDER — DOCUSATE SODIUM 100 MG PO CAPS
100.0000 mg | ORAL_CAPSULE | Freq: Two times a day (BID) | ORAL | Status: DC
  Administered 2022-01-18 – 2022-03-08 (×89): 100 mg via ORAL
  Filled 2022-01-18 (×94): qty 1

## 2022-01-18 MED ORDER — VANCOMYCIN HCL 1000 MG IV SOLR
INTRAVENOUS | Status: AC
  Filled 2022-01-18: qty 20

## 2022-01-18 MED ORDER — FENTANYL CITRATE (PF) 100 MCG/2ML IJ SOLN: 100.0000 ug | Freq: Once | INTRAMUSCULAR | Status: AC

## 2022-01-18 MED ORDER — ONDANSETRON HCL 4 MG/2ML IJ SOLN
INTRAMUSCULAR | Status: DC | PRN
  Administered 2022-01-18: 4 mg via INTRAVENOUS

## 2022-01-18 MED ORDER — FENTANYL CITRATE (PF) 100 MCG/2ML IJ SOLN
INTRAMUSCULAR | Status: AC
  Administered 2022-01-18: 100 ug via INTRAVENOUS
  Filled 2022-01-18: qty 2

## 2022-01-18 MED ORDER — LACTATED RINGERS IV SOLN: INTRAVENOUS | Status: DC

## 2022-01-18 MED ORDER — PHENYLEPHRINE HCL-NACL 20-0.9 MG/250ML-% IV SOLN
INTRAVENOUS | Status: DC | PRN
  Administered 2022-01-18: 50 ug/min via INTRAVENOUS

## 2022-01-18 MED ORDER — FENTANYL CITRATE (PF) 250 MCG/5ML IJ SOLN
INTRAMUSCULAR | Status: AC
  Filled 2022-01-18: qty 5

## 2022-01-18 MED ORDER — BUPIVACAINE LIPOSOME 1.3 % IJ SUSP
INTRAMUSCULAR | Status: DC | PRN
  Administered 2022-01-18: 10 mL via PERINEURAL

## 2022-01-18 MED ORDER — PROPOFOL 10 MG/ML IV BOLUS
INTRAVENOUS | Status: DC | PRN
  Administered 2022-01-18: 120 mg via INTRAVENOUS

## 2022-01-18 MED ORDER — MIDAZOLAM HCL 2 MG/2ML IJ SOLN
INTRAMUSCULAR | Status: AC
  Administered 2022-01-18: 2 mg via INTRAVENOUS
  Filled 2022-01-18: qty 2

## 2022-01-18 MED ORDER — POVIDONE-IODINE 10 % EX SWAB
2.0000 "application " | Freq: Once | CUTANEOUS | Status: AC
  Administered 2022-01-18: 2 via TOPICAL

## 2022-01-18 MED ORDER — ENOXAPARIN SODIUM 40 MG/0.4ML IJ SOSY
40.0000 mg | PREFILLED_SYRINGE | INTRAMUSCULAR | Status: DC
  Administered 2022-01-19 – 2022-03-08 (×49): 40 mg via SUBCUTANEOUS
  Filled 2022-01-18 (×50): qty 0.4

## 2022-01-18 MED ORDER — LIDOCAINE 2% (20 MG/ML) 5 ML SYRINGE
INTRAMUSCULAR | Status: DC | PRN
  Administered 2022-01-18: 40 mg via INTRAVENOUS

## 2022-01-18 MED ORDER — QUETIAPINE FUMARATE 50 MG PO TABS
50.0000 mg | ORAL_TABLET | Freq: Every day | ORAL | Status: DC
Start: 2022-01-18 — End: 2022-03-08
  Administered 2022-01-19 – 2022-03-07 (×48): 50 mg via ORAL
  Filled 2022-01-18 (×49): qty 1

## 2022-01-18 MED ORDER — CHLORHEXIDINE GLUCONATE 0.12 % MT SOLN
OROMUCOSAL | Status: AC
  Administered 2022-01-18: 15 mL via OROMUCOSAL
  Filled 2022-01-18: qty 15

## 2022-01-18 SURGICAL SUPPLY — 77 items
AID PSTN UNV HD RSTRNT DISP (MISCELLANEOUS) ×1
ALCOHOL 70% 16 OZ (MISCELLANEOUS) ×2 IMPLANT
APL PRP STRL LF DISP 70% ISPRP (MISCELLANEOUS) ×1
BAG COUNTER SPONGE SURGICOUNT (BAG) ×2 IMPLANT
BAG SPNG CNTER NS LX DISP (BAG) ×1
BLADE SAW SGTL 13X75X1.27 (BLADE) ×2 IMPLANT
CANISTER WOUND CARE 500ML ATS (WOUND CARE) ×1 IMPLANT
CEMENT GENTAMICIN CEMEX 40G (Cement) ×1 IMPLANT
CHLORAPREP W/TINT 26 (MISCELLANEOUS) ×2 IMPLANT
COOLER ICEMAN CLASSIC (MISCELLANEOUS) ×2 IMPLANT
COVER SURGICAL LIGHT HANDLE (MISCELLANEOUS) ×2 IMPLANT
DRAPE INCISE IOBAN 66X45 STRL (DRAPES) ×2 IMPLANT
DRAPE U-SHAPE 47X51 STRL (DRAPES) ×4 IMPLANT
DRESSING PEEL AND PLAC PRVNA20 (GAUZE/BANDAGES/DRESSINGS) IMPLANT
DRSG AQUACEL AG ADV 3.5X10 (GAUZE/BANDAGES/DRESSINGS) ×2 IMPLANT
DRSG PEEL AND PLACE PREVENA 20 (GAUZE/BANDAGES/DRESSINGS) ×2
ELECT BLADE 4.0 EZ CLEAN MEGAD (MISCELLANEOUS) ×2
ELECT REM PT RETURN 9FT ADLT (ELECTROSURGICAL) ×2
ELECTRODE BLDE 4.0 EZ CLN MEGD (MISCELLANEOUS) ×1 IMPLANT
ELECTRODE REM PT RTRN 9FT ADLT (ELECTROSURGICAL) ×1 IMPLANT
EVACUATOR 1/8 PVC DRAIN (DRAIN) ×1 IMPLANT
GAUZE SPONGE 4X4 12PLY STRL LF (GAUZE/BANDAGES/DRESSINGS) ×2 IMPLANT
GLOVE SRG 8 PF TXTR STRL LF DI (GLOVE) ×1 IMPLANT
GLOVE SURG LTX SZ7 (GLOVE) ×2 IMPLANT
GLOVE SURG LTX SZ8 (GLOVE) ×2 IMPLANT
GLOVE SURG UNDER POLY LF SZ7 (GLOVE) ×2 IMPLANT
GLOVE SURG UNDER POLY LF SZ8 (GLOVE) ×2
GOWN STRL REUS W/ TWL LRG LVL3 (GOWN DISPOSABLE) ×1 IMPLANT
GOWN STRL REUS W/ TWL XL LVL3 (GOWN DISPOSABLE) ×1 IMPLANT
GOWN STRL REUS W/TWL LRG LVL3 (GOWN DISPOSABLE) ×2
GOWN STRL REUS W/TWL XL LVL3 (GOWN DISPOSABLE) ×2
HYDROGEN PEROXIDE 16OZ (MISCELLANEOUS) ×2 IMPLANT
JET LAVAGE IRRISEPT WOUND (IRRIGATION / IRRIGATOR) ×2
KIT BASIN OR (CUSTOM PROCEDURE TRAY) ×2 IMPLANT
KIT SHOULDER SPACER 7MM STEM (Shoulder) ×1 IMPLANT
KIT TURNOVER KIT B (KITS) ×2 IMPLANT
LAVAGE JET IRRISEPT WOUND (IRRIGATION / IRRIGATOR) ×1 IMPLANT
LOOP VESSEL MAXI BLUE (MISCELLANEOUS) ×2 IMPLANT
MANIFOLD NEPTUNE II (INSTRUMENTS) ×2 IMPLANT
NDL SUT 6 .5 CRC .975X.05 MAYO (NEEDLE) IMPLANT
NDL TAPERED W/ NITINOL LOOP (MISCELLANEOUS) ×1 IMPLANT
NEEDLE MAYO TAPER (NEEDLE)
NEEDLE TAPERED W/ NITINOL LOOP (MISCELLANEOUS) ×2 IMPLANT
NS IRRIG 1000ML POUR BTL (IV SOLUTION) ×2 IMPLANT
PACK SHOULDER (CUSTOM PROCEDURE TRAY) ×2 IMPLANT
PAD ARMBOARD 7.5X6 YLW CONV (MISCELLANEOUS) ×4 IMPLANT
PAD COLD SHLDR WRAP-ON (PAD) ×2 IMPLANT
PASSER SUT SWANSON 36MM LOOP (INSTRUMENTS) ×2 IMPLANT
RESTRAINT HEAD UNIVERSAL NS (MISCELLANEOUS) ×2 IMPLANT
SLING ARM IMMOBILIZER LRG (SOFTGOODS) ×2 IMPLANT
SLING ARM IMMOBILIZER XL (CAST SUPPLIES) ×1 IMPLANT
SOL PREP POV-IOD 4OZ 10% (MISCELLANEOUS) ×2 IMPLANT
SPONGE T-LAP 18X18 ~~LOC~~+RFID (SPONGE) ×4 IMPLANT
STRIP CLOSURE SKIN 1/2X4 (GAUZE/BANDAGES/DRESSINGS) ×2 IMPLANT
SUCTION FRAZIER HANDLE 10FR (MISCELLANEOUS) ×2
SUCTION TUBE FRAZIER 10FR DISP (MISCELLANEOUS) ×1 IMPLANT
SUT ETHILON 2 0 FS 18 (SUTURE) ×2 IMPLANT
SUT FIBERWIRE #2 38 T-5 BLUE (SUTURE)
SUT MAXBRAID (SUTURE) IMPLANT
SUT MNCRL AB 3-0 PS2 18 (SUTURE) ×2 IMPLANT
SUT SILK 2 0 (SUTURE) ×2
SUT SILK 2 0 TIES 10X30 (SUTURE) ×2 IMPLANT
SUT SILK 2-0 18XBRD TIE 12 (SUTURE) IMPLANT
SUT VIC AB 0 CT1 27 (SUTURE) ×8
SUT VIC AB 0 CT1 27XBRD ANBCTR (SUTURE) ×4 IMPLANT
SUT VIC AB 1 CT1 27 (SUTURE) ×8
SUT VIC AB 1 CT1 27XBRD ANBCTR (SUTURE) ×2 IMPLANT
SUT VIC AB 2-0 CT1 27 (SUTURE) ×6
SUT VIC AB 2-0 CT1 TAPERPNT 27 (SUTURE) ×3 IMPLANT
SUT VICRYL 0 UR6 27IN ABS (SUTURE) ×4 IMPLANT
SUTURE FIBERWR #2 38 T-5 BLUE (SUTURE) IMPLANT
SWAB COLLECTION DEVICE MRSA (MISCELLANEOUS) ×3 IMPLANT
SWAB CULTURE ESWAB REG 1ML (MISCELLANEOUS) ×3 IMPLANT
TOWEL GREEN STERILE (TOWEL DISPOSABLE) ×2 IMPLANT
TRAY FOL W/BAG SLVR 16FR STRL (SET/KITS/TRAYS/PACK) IMPLANT
TRAY FOLEY W/BAG SLVR 16FR LF (SET/KITS/TRAYS/PACK)
WATER STERILE IRR 1000ML POUR (IV SOLUTION) ×2 IMPLANT

## 2022-01-18 NOTE — Progress Notes (Signed)
PROGRESS NOTE Jeremiah [name]  YSA:[id number] DOB: 1951/04/02 DOA: 01/15/2022 PCP: Carol Ada, MD   Brief Narrative/Hospital Course: 71 y.o. male with medical history significant of CAD s/p bypass in 1998,Hx of PE and DVT on Eliquis, HTN, alcohol abuse with hx of withdrawal, opioid dependency previous on Suboxone presented with acute left shoulder and left neck pain.  Patient appeared to be a limited historian.Frustrated that questions are being asked.Has not been on any of his medications.Reportedly patient lost his wife last year and moved out to [place] to live with his brother. However did not get along with his sister-in-law and has recently moved back here to [place] last week and noted to be homeless and living in [place].Noted to have multiple admissions recently in [place] for alcohol withdrawal.  In 10/2021 he was admitted following a fall and was noted to have a large left-sided chest wall hematoma  had multiple I&D and wound VAC in January due to persistent refractory hematoma, found to have chronically dislocated left shoulder arthroplasty in the setting of likely infection. In Care Everywhere 02/06/2021-CTA showed large burden of thromboembolic disease with central pulmonary artery distribution.  Saw Pulm in office with Dr Janett Billow at [facility]- who noted 'it sounds like his pulmonary embolism was associated with some lower extremity cellulitis which he was hospitalized and minimally mobile. He was treated with Eliquis, now off."  11/15/21- was discharged from [facility] a fall with chest wall hematoma/blood loss anemia chronic dislocation of left shoulder-had multiple procedures back treatment- was discharged on oral doxycycline  In the ED, hemodynamically stable afebrile Labs with anemia of 9.1 with no recent prior for  comparison.stable BMP, EKG -atrial flutter and 4-1 conduction with RBBB,BNP of 498.  Troponin reassuring at 15-->17.Left  shoulder x-ray showing anterior dislocation of left reverse total shoulder arthroplasty.Chest x-ray with clear lungs./Cardiomegaly.Further labs with ESR 44 D-dimer 1.1 TSH 1.5 COVID flu negative alcohol less than 10 UDS positive for benzo. Patient was admitted with IV vancomycin after dose of Lasix.  12/26- s/pCTA-no PE, showed cardiomegaly with reflux of contrast into the IVC and hepatic vein suggesting right heart dysfunction, pulmonary artery hypertension, thoracic aortic aneurysm up to 4.7 cm advised semiannual imaging follow-up CT or MRI and referral to cardiothoracic surgery as outpatient.Seen by orthopedic surgeon in consultation had CT of left shoulder-showing postsurgical changes and dislocation, large periprosthetic fluid collection surrounding the proximal humerus and extending throughout the left axillary region up to 7.8 X4.4X 8.8-sequelae of postoperative seroma, hematoma versus particle disease. 12/26- Echo with new onset of reduced EF-cards following. 12/27-CT-guided biopsy of left anterior axillary fluid 30 mL cloudy sent for culture, transfer initiated to [facility] for orthopedic surgery.  Subjective:  Seen this morning.  Alert awake oriented.  Pain mostly in the left shoulder  He is n.p.o. now for surgery later today, npo-"waiting for transfer -OR. Denies chest pain shortness of breath nausea vomiting. Overnight no fever, heart rate is stable-no episode of bradycardia  Assessment and Plan: * Shoulder wound, left, initial encounter Left shoulder pain Previous MRSA infection of shoulder Left shoulder reverse total shoulder arthroplasty anterior dislocation  Chronic with intermittent draining wound over left anterior shoulder: CT of left shoulder-left axillary region up to 7.8X4.4X 8.8 fluid collection- 2/27s/p CT-guided biopsy of left ant axillary fluid 30 mL cloudy sent for culture,transfer initiated to [facility] for orthopedic surgery-Dr. Marlou Sa advised transfer to [facility]-  procedure scheduled for 2/28- for removal of hardware, irrigation/debridement and placement of antibiotic  spacer with 6-8 weeks of IV antibiotic- will need ID CONSULT. Continue pain regimen. He is  moderate to high risk given his significant cardiac history CHF status but is fairly stable not on oxygen lungs clear, cardiology following.  Left shoulder pain See above  Shoulder dislocation, recurrent, left See above.   Atrial flutter ([place]) New onset a flutter in the ED Asymptomatic bradycardia during the night 2/26: TSH stable 1.5.had a transient bradycardia event with pause up to 2.9 seconds 2/26 night-cardiology following,anticoagulation deferred to cardiology-Of note Dr Marlou Sa recommended okay to for anticoagulation-avoid nodal blocking agent  CAD (coronary artery disease) CAD s/p cabg x4 1998 Chronic trifascicular block HLD: Continue aspirin, statin.  Holding antihypertensive meds AV nodal agent (Aldactone, ACE inhibitor, labetalol)due to soft BP/episode of bradycardia.  Nonischemic nuclear stress test with EF 46% on 09/09/2019.Cardiology on board  Acute on chronic systolic CHF (congestive heart failure) (HCC) Acute on chronic systolic CHF with EF 35 to 40% Moderate to severe aortic regurgitation on prior echo now mild Aortic root 4.27 cm Medical nonadherence Lower leg edema: No taking Bumex- ran out for month he says.CTA shows cardiomegaly with contrast in IVC and hepatic veins suggesting right heart dysfunction.seen by cardiology.Cont diuresis as blood pressure tolerates.  Monitor intake output Daily weight, GDMT defer to cardiologyNet IO Since Admission: -5,855 mL [01/18/22 0955]  Filed Weights   01/16/22 1017 01/17/22 0500 01/18/22 0551  Weight: 86.2 kg 85.2 kg 89.2 kg    History of pulmonary embolism- (present on admission) See above  Elevated d-dimer History of previous PE in March 2022-was on eliquis and not currently-see HPI CTA-no PE,suggesting right heart dysfunction,  pulmonary artery hypertension.  Thoracic aortic aneurysm (TAA) CTA incidentally showed a 4.7 cm thoracic aortic aneurysm cardiology evaluation he needs instruction for semiannual imaging follow-up CT or MRI and referral to cardiac surgery as outpatient.  Hypotension Transient low BP once,got ivf 250 ml bolus. stable now. Monitor.   B12 deficiency Cont replacement high dose.  Depression Anxiety/depression. mood is stable. resume Xanax/BuSpar, Atarax, Wellbutrin, Neurontin  Alcohol abuse Cessation counseling, monitor withdrawal symptoms, currently homeless  Normocytic anemia Hemoglobin holding around 8 to 9 g.  Monitor Recent Labs  Lab 01/15/22 2130 01/16/22 0530  HGB 9.1* 8.9*  HCT 30.6* 29.8*    Prolonged QT interval Monitoring EKG  DVT prophylaxis: SCDs Start: 01/15/22 2347 Code Status:   Code Status: Full Code Family Communication: plan of care discussed with patient at bedside. Disposition: Currently not medically stable for discharge. Status is: Inpatient Remains inpatient appropriate because: Ongoing management left shoulder pain, ortho IR cardio eval.  Objective: Vitals last 24 hrs: Vitals:   01/17/22 0813 01/17/22 1530 01/17/22 2014 01/18/22 0551  BP: 118/72 126/72 108/62 110/63  Pulse: 65 64 63 65  Resp: 18 16 18 20   Temp:  98.1 F (36.7 C) 98 F (36.7 C) 97.7 F (36.5 C)  TempSrc:  Oral Oral   SpO2: 100% 100% 99% 100%  Weight:    89.2 kg  Height:       Weight change: 3 kg  Physical Examination: General exam: AA0X3, forgetful/poor historian,older than stated age, weak appearing. HEENT:Oral mucosa moist, Ear/Nose WNL grossly, dentition normal. Respiratory system: bilaterally clear,no use of accessory muscle Cardiovascular system: S1 & S2 +, No JVD. Gastrointestinal system: Abdomen soft,NT,ND, BS+ Nervous System:Alert, awake, moving extremities and grossly nonfocal Extremities: edema neg,distal peripheral pulses palpable.  Skin: No rashes,no  icterus. MSK: Normal muscle bulk,tone, power Left shoulder with sling in place,tender with  dressing present anteriorly  Medications reviewed:  Scheduled Meds:  [START ON 01/19/2022] allopurinol  100 mg Oral Daily   [START ON 01/19/2022] aspirin EC  81 mg Oral Daily   buPROPion  300 mg Oral q morning   busPIRone  15 mg Oral TID   Chlorhexidine Gluconate Cloth  6 each Topical Q0600   furosemide  40 mg Intravenous Daily   gabapentin  300 mg Oral BID   hydrOXYzine  25 mg Oral TID   melatonin  3 mg Oral QHS   mupirocin ointment  1 application Nasal BID   QUEtiapine  50 mg Oral QHS   rosuvastatin  40 mg Oral Daily   vitamin B-12  1,000 mcg Oral Daily   Continuous Infusions:  vancomycin 1,250 mg (01/17/22 2115)      Diet Order             Diet NPO time specified  Diet effective ____                            Intake/Output Summary (Last 24 hours) at 01/18/2022 1013 Last data filed at 01/18/2022 0551 Gross per 24 hour  Intake 720 ml  Output 3875 ml  Net -3155 ml   Net IO Since Admission: -5,855 mL [01/18/22 1013]  Wt Readings from Last 3 Encounters:  01/18/22 89.2 kg  10/02/20 115.7 kg  03/17/20 109.8 kg     Unresulted Labs (From admission, onward)     Start     Ordered   01/18/22 0500  Creatinine, serum  Daily,   R     Comments: Can discontinue lab order if vancomycin discontinued    01/17/22 1003           Data Reviewed: I have personally reviewed following labs and imaging studies CBC: Recent Labs  Lab 01/15/22 2130 01/16/22 0530  WBC 5.1 3.9*  NEUTROABS 3.4  --   HGB 9.1* 8.9*  HCT 30.6* 29.8*  MCV 88.2 87.9  PLT 238 709   Basic Metabolic Panel: Recent Labs  Lab 01/15/22 2130 01/17/22 0231 01/18/22 0431 01/18/22 0822  NA 137 134*  --  135  K 4.3 4.3  --  4.0  CL 108 107  --  106  CO2 20* 23  --  24  GLUCOSE 75 79  --  138*  BUN 21 29*  --  27*  CREATININE 0.95 1.28* 0.96 0.89  CALCIUM 8.5* 7.9*  --  7.9*  MG  --  1.9  --  1.7    GFR: Estimated Creatinine Clearance: 83.4 mL/min (by C-G formula based on SCr of 0.89 mg/dL). Liver Function Tests: Recent Labs  Lab 01/15/22 2130 01/17/22 0231  AST 22  --   ALT 16  --   ALKPHOS 95  --   BILITOT 0.9  --   PROT 6.1*  --   ALBUMIN 2.8* 2.3*   No results for input(s): LIPASE, AMYLASE in the last 168 hours. No results for input(s): AMMONIA in the last 168 hours. Coagulation Profile: No results for input(s): INR, PROTIME in the last 168 hours. Cardiac Enzymes: No results for input(s): CKTOTAL, CKMB, CKMBINDEX, TROPONINI in the last 168 hours. BNP (last 3 results) No results for input(s): PROBNP in the last 8760 hours. HbA1C: No results for input(s): HGBA1C in the last 72 hours. CBG: No results for input(s): GLUCAP in the last 168 hours. Lipid Profile: No results for input(s): CHOL, HDL, LDLCALC, TRIG,  CHOLHDL, LDLDIRECT in the last 72 hours. Thyroid Function Tests: Recent Labs    01/16/22 0255  TSH 1.588   Anemia Panel: Recent Labs    01/16/22 0255  VITAMINB12 176*  FOLATE 20.8  TIBC 283  IRON 24*   Sepsis Labs: No results for input(s): PROCALCITON, LATICACIDVEN in the last 168 hours.  Recent Results (from the past 240 hour(s))  Resp Panel by RT-PCR (Flu A&B, Covid) Nasopharyngeal Swab     Status: None   Collection Time: 01/15/22 11:38 PM   Specimen: Nasopharyngeal Swab; Nasopharyngeal(NP) swabs in vial transport medium  Result Value Ref Range Status   SARS Coronavirus 2 by RT PCR NEGATIVE NEGATIVE Final    Comment: (NOTE) SARS-CoV-2 target nucleic acids are NOT DETECTED.  The SARS-CoV-2 RNA is generally detectable in upper respiratory specimens during the acute phase of infection. The lowest concentration of SARS-CoV-2 viral copies this assay can detect is 138 copies/mL. A negative result does not preclude SARS-Cov-2 infection and should not be used as the sole basis for treatment or other patient management decisions. A negative result  may occur with  improper specimen collection/handling, submission of specimen other than nasopharyngeal swab, presence of viral mutation(s) within the areas targeted by this assay, and inadequate number of viral copies(<138 copies/mL). A negative result must be combined with clinical observations, patient history, and epidemiological information. The expected result is Negative.  Fact Sheet for Patients:  EntrepreneurPulse.com.au  Fact Sheet for Healthcare Providers:  IncredibleEmployment.be  This test is no t yet approved or cleared by the [place] FDA and  has been authorized for detection and/or diagnosis of SARS-CoV-2 by FDA under an Emergency Use Authorization (EUA). This EUA will remain  in effect (meaning this test can be used) for the duration of the COVID-19 declaration under Section 564(b)(1) of the Act, 21 U.S.C.section 360bbb-3(b)(1), unless the authorization is terminated  or revoked sooner.       Influenza A by PCR NEGATIVE NEGATIVE Final   Influenza B by PCR NEGATIVE NEGATIVE Final    Comment: (NOTE) The Xpert Xpress SARS-CoV-2/FLU/RSV plus assay is intended as an aid in the diagnosis of influenza from Nasopharyngeal swab specimens and should not be used as a sole basis for treatment. Nasal washings and aspirates are unacceptable for Xpert Xpress SARS-CoV-2/FLU/RSV testing.  Fact Sheet for Patients: EntrepreneurPulse.com.au  Fact Sheet for Healthcare Providers: IncredibleEmployment.be  This test is not yet approved or cleared by the [place] FDA and has been authorized for detection and/or diagnosis of SARS-CoV-2 by FDA under an Emergency Use Authorization (EUA). This EUA will remain in effect (meaning this test can be used) for the duration of the COVID-19 declaration under Section 564(b)(1) of the Act, 21 U.S.C. section 360bbb-3(b)(1), unless the authorization is terminated  or revoked.  Performed at [facility], [place] [address], [place], [place] [address]   MRSA Next Gen by PCR, Nasal     Status: Abnormal   Collection Time: 01/16/22 12:47 AM   Specimen: Nasal Mucosa; Nasal Swab  Result Value Ref Range Status   MRSA by PCR Next Gen DETECTED (A) NOT DETECTED Final    Comment: (NOTE) The GeneXpert MRSA Assay (FDA approved for NASAL specimens only), is one component of a comprehensive MRSA colonization surveillance program. It is not intended to diagnose MRSA infection nor to guide or monitor treatment for MRSA infections. Test performance is not FDA approved in patients less than 51 years old. Performed at [facility], [place]  [address] [place], [place] [address]   Aerobic/Anaerobic Culture w Gram Stain (surgical/deep wound)     Status: None (Preliminary result)   Collection Time: 01/17/22  2:52 PM   Specimen: Abscess  Result Value Ref Range Status   Specimen Description   Final    ABSCESS ABSCESS Performed at [place] [address], [place], [place] [address]    Special Requests   Final    LEFT Performed at [facility], [place] [address], [place], [place] [address]    Gram Stain   Final    NO SQUAMOUS EPITHELIAL CELLS SEEN FEW WBC SEEN NO ORGANISMS SEEN    Culture   Final    NO GROWTH < 24 HOURS Performed at [facility], [place] [address] [place], [place] [address]    Report Status PENDING  Incomplete    Antimicrobials: Anti-infectives (From admission, onward)    Start     Dose/Rate Route Frequency Ordered Stop   01/16/22 2200  vancomycin (VANCOREADY) IVPB 1250 mg/250 mL        1,250 mg 166.7 mL/hr over 90 Minutes Intravenous Every 24 hours 01/16/22 0107     01/16/22 0030  vancomycin (VANCOREADY) IVPB 1500 mg/300 mL        1,500 mg 150 mL/hr over 120 Minutes Intravenous  Once 01/16/22 0018 01/16/22 0240   01/16/22 0015  vancomycin (VANCOREADY) IVPB 2000  mg/400 mL  Status:  Discontinued        2,000 mg 200 mL/hr over 120 Minutes Intravenous  Once 01/16/22 0001 01/16/22 0018      Culture/Microbiology    Component Value Date/Time   SDES  01/17/2022 1452    ABSCESS ABSCESS Performed at [facility], [place] [address] [place], [place] [address]    SPECREQUEST  01/17/2022 1452    LEFT Performed at [facility], [place] [address], [place], [place] [address]    CULT  01/17/2022 1452    NO GROWTH < 24 HOURS Performed at [place] [address] [place], [place] [address]    REPTSTATUS PENDING 01/17/2022 1452    Other culture-see note  Radiology Studies: CT Angio Chest Pulmonary Embolism (PE) W or WO Contrast  Result Date: 01/16/2022 CLINICAL DATA:  Fall, chest pain. Pulmonary embolism (PE) suspected, positive D-dimer. History of PE EXAM: CT ANGIOGRAPHY CHEST WITH CONTRAST TECHNIQUE: Multidetector CT imaging of the chest was performed using the standard protocol during bolus administration of intravenous contrast. Multiplanar CT image reconstructions and MIPs were obtained to evaluate the vascular anatomy. RADIATION DOSE REDUCTION: This exam was performed according to the departmental dose-optimization program which includes automated exposure control, adjustment of the mA and/or kV according to patient size and/or use of iterative reconstruction technique. CONTRAST:  61m OMNIPAQUE IOHEXOL 350 MG/ML SOLN COMPARISON:  04/06/2021 FINDINGS: Cardiovascular: Satisfactory opacification of the pulmonary arteries to the segmental level. Evaluation of the more distal arterial branches is degraded by respiratory motion artifact. Within this limitation, there is no evidence of pulmonary embolism. Main pulmonary trunk measures 3.8 cm in diameter. Mid ascending thoracic aorta measures 4.7 cm in diameter. Scattered atherosclerotic calcifications of the aorta and coronary arteries. Cardiomegaly. Previous CABG. No  pericardial effusion. Mediastinum/Nodes: No enlarged mediastinal, hilar, or axillary lymph nodes. Thyroid gland, trachea, and esophagus demonstrate no significant findings. Lungs/Pleura: Trace bilateral pleural effusions with associated bibasilar subsegmental atelectasis. Previously seen right upper lobe pulmonary nodule has resolved. No pneumothorax. Upper Abdomen: Reflux of contrast into  the IVC and hepatic veins. Postsurgical changes at the gastroesophageal junction. No acute findings are evident within the included upper abdomen. Musculoskeletal: Dislocated left shoulder arthroplasty. Additional findings involving the left shoulder will be dictated on dedicated left shoulder CT. Severe osteoarthritis of the right shoulder with associated joint effusion. Degenerative changes of the thoracic spine without fracture. Review of the MIP images confirms the above findings. IMPRESSION: 1. Negative for pulmonary embolism. 2. Trace bilateral pleural effusions with associated bibasilar subsegmental atelectasis. 3. Cardiomegaly with reflux of contrast into the IVC and hepatic veins, suggesting right heart dysfunction. 4. Dilated main pulmonary trunk suggesting pulmonary arterial hypertension. 5. Dislocated left shoulder arthroplasty with additional findings involving the left shoulder will be dictated on dedicated left shoulder CT. 6. Thoracic aortic aneurysm measuring up to 4.7 cm. Recommend semi-annual imaging followup by CTA or MRA and referral to cardiothoracic surgery if not already obtained. This recommendation follows 2010 ACCF/AHA/AATS/ACR/ASA/SCA/SCAI/SIR/STS/SVM Guidelines for the Diagnosis and Management of Patients With Thoracic Aortic Disease. Circulation. 2010; 121: D176-H607. Aortic aneurysm NOS (ICD10-I71.9) 7. Aortic and coronary artery atherosclerosis (ICD10-I70.0). Electronically Signed   By: Davina Poke D.O.   On: 01/16/2022 14:43   CT SHOULDER LEFT W CONTRAST  Result Date: 01/16/2022 CLINICAL  DATA:  Dislocated left shoulder arthroplasty EXAM: CT OF THE UPPER LEFT EXTREMITY WITH CONTRAST TECHNIQUE: Multidetector CT imaging of the left shoulder was performed according to the standard protocol following intravenous contrast administration. RADIATION DOSE REDUCTION: This exam was performed according to the departmental dose-optimization program which includes automated exposure control, adjustment of the mA and/or kV according to patient size and/or use of iterative reconstruction technique. CONTRAST:  32m OMNIPAQUE IOHEXOL 350 MG/ML SOLN COMPARISON:  X-ray 01/15/2022, 01/25/2021.  CT 02/04/2021 FINDINGS: Bones/Joint/Cartilage Postsurgical changes from previous reverse left shoulder arthroplasty. The humeral component is anteromedially displaced relative to the glenoid component. Humeral component is perched along the anteroinferior aspect of the mid clavicle. There is a component of cortical thickening and callus formation along the left clavicular diaphysis, new from 02/04/2021 (series 8, image 19) raising the possibility of subacute chronicity of dislocation. Components of the hardware appear intact. No periprosthetic lucency or fracture is evident within the limitations of the exam which is slightly degraded by metallic streak artifact. Ligaments Suboptimally assessed by CT. Muscles and Tendons Rotator cuff muscle atrophy. Soft tissues Large periprosthetic fluid collection surrounding the proximal humerus and extending throughout the left axillary region with maximal transverse measurements of approximately 7.8 x 4.4 cm (series 11, image 41) and extending approximately 8.8 cm in craniocaudal dimension (series 13, image 55). Visualized portion of the collection has a well-defined, slightly thickened wall. IMPRESSION: 1. Postsurgical changes from previous reverse left shoulder arthroplasty. The humeral component is anteromedially displaced relative to the glenoid component. Humeral component is perched  along the anteroinferior aspect of the mid clavicle. 2. There is a component of cortical thickening and callus formation along the left clavicular diaphysis, new from 02/04/2021 raising the possibility of subacute chronicity of the dislocation. 3. Large periprosthetic fluid collection surrounding the proximal humerus and extending throughout the left axillary region measuring approximately 7.8 x 4.4 x 8.8 cm. Visualized portion of the collection has a well-defined, slightly thickened wall. Findings could represent sequela of postoperative seroma, hematoma, versus particle disease. Electronically Signed   By: NDavina PokeD.O.   On: 01/16/2022 14:55   CT [place]UIDED BIOPSY  Result Date: 01/17/2022 INDICATION: 71year old male with history of left total shoulder arthroplasty complicated by dislocation and left  axillary fluid collection. Aspiration of fluid collection is requested as operative plan is formulated. EXAM: Ultrasound-guided left axillary fluid collection aspiration MEDICATIONS: The patient is currently admitted to the hospital and receiving intravenous antibiotics. The antibiotics were administered within an appropriate time frame prior to the initiation of the procedure. ANESTHESIA/SEDATION: Moderate (conscious) sedation was employed during this procedure. A total of Versed 0 mg and Fentanyl 50 mcg was administered intravenously by the radiology nurse. Total intra-service moderate Sedation Time: 0 minutes. The patient's level of consciousness and vital signs were monitored continuously by radiology nursing throughout the procedure under my direct supervision. COMPLICATIONS: None immediate. PROCEDURE: Informed written consent was obtained from the patient after a thorough discussion of the procedural risks, benefits and alternatives. All questions were addressed. Maximal Sterile Barrier Technique was utilized including caps, mask, sterile gowns, sterile gloves, sterile drape, hand hygiene and skin  antiseptic. A timeout was performed prior to the initiation of the procedure. Preprocedure CT evaluation demonstrated a left actually fluid collection similar to comparison study which measured up to approximately 7.3 x 3.6 cm in maximum axial dimensions. CT guidance was used to target ultrasound-guided evaluation. Preprocedure ultrasound demonstrated phlegmonous changes about the left anterior axilla with central anechoic portions which in hole abutted the indwelling left shoulder arthroplasty hardware. The procedure was planned. The left anterior shoulder was prepped and draped in standard fashion. Subdermal Local anesthesia was administered at the planned needle entry site. Under direct ultrasound visualization, a 10 cm, 5 [place] Yueh catheter was directed into the deepest aspect of the left axillary fluid collection. The needle was then removed from the outer cannula and aspiration was performed which yielded approximately 30 mL of cloudy, yellow aspirate. After the Yueh catheter was removed, ultrasound evaluation demonstrated trace residual fluid within the persistent phlegmonous structure. Hemostasis was achieved with brief manual compression and a bandage was applied. The patient tolerated procedure well was transferred back to the floor in stable condition. IMPRESSION: Technically successful CT and ultrasound guided left anterior axillary fluid collection aspiration yielding approximately 30 mL of cloudy, yellow aspirate. Sample was sent to the lab for culture. Ruthann Cancer, MD [facility]iology Electronically Signed   By: Ruthann Cancer M.D.   On: 01/17/2022 16:10     LOS: 3 days   Antonieta Pert, MD [facility]  01/18/2022, 10:13 AM

## 2022-01-18 NOTE — TOC Initial Note (Signed)
Transition of Care [facility]) - Initial/Assessment Note    Patient Details  Name: Jeremiah [name] MRN: [id number] Date of Birth: 07/02/1951  [facility] CM/SW Contact:    Dessa Phi, RN Phone Number: 01/18/2022, 10:07 AM  Clinical Narrative:  Noted for transfer to [facility] for procedure.Monitor for d/c needs.Will need to offer etoh resources.                 Expected Discharge Plan: Home/Self Care Barriers to Discharge: Continued Medical Work up   Patient Goals and CMS Choice Patient states their goals for this hospitalization and ongoing recovery are:: Home CMS Medicare.gov Compare Post Acute Care list provided to:: Patient Choice offered to / list presented to : Patient  Expected Discharge Plan and Services Expected Discharge Plan: Home/Self Care   Discharge Planning Services: CM Consult   Living arrangements for the past 2 months: Single Family Home                                      Prior Living Arrangements/Services Living arrangements for the past 2 months: Single Family Home Lives with:: Spouse Patient language and need for interpreter reviewed:: Yes Do you feel safe going back to the place where you live?: Yes      Need for Family Participation in Patient Care: Yes (Comment) Care giver support system in place?: Yes (comment)   Criminal Activity/Legal Involvement Pertinent to Current Situation/Hospitalization: No - Comment as needed  Activities of Daily Living Home Assistive Devices/Equipment: Cane (specify quad or straight) ADL Screening (condition at time of admission) Patient's cognitive ability adequate to safely complete daily activities?: Yes Is the patient deaf or have difficulty hearing?: No Does the patient have difficulty seeing, even when wearing glasses/contacts?: No Does the patient have difficulty concentrating, remembering, or making decisions?: No Patient able to express need for assistance with ADLs?: Yes Does the patient have  difficulty dressing or bathing?: No Independently performs ADLs?: Yes (appropriate for developmental age) Does the patient have difficulty walking or climbing stairs?: Yes Weakness of Legs: Both Weakness of Arms/Hands: Left  Permission Sought/Granted Permission sought to share information with : Case Manager Permission granted to share information with : Yes, Verbal Permission Granted  Share Information with NAME: Case Manager           Emotional Assessment              Admission diagnosis:  Atypical chest pain [R07.89] Chest pain [R07.9] Anterior dislocation of left shoulder, initial encounter [S43.015A] Patient Active Problem List   Diagnosis Date Noted   CAD (coronary artery disease) 01/18/2022   Thoracic aortic aneurysm (TAA) 01/17/2022   Acute on chronic systolic CHF (congestive heart failure) ([place]) 01/16/2022   Atrial flutter ([place]) 01/16/2022   Traumatic hematoma of left shoulder 01/16/2022   Shoulder dislocation, recurrent, left 01/16/2022   Prolonged QT interval 01/16/2022   Normocytic anemia 01/16/2022   Alcohol abuse 01/16/2022   Elevated d-dimer 01/16/2022   Lower leg edema 01/16/2022   B12 deficiency 01/16/2022   Hypotension 01/16/2022   Left shoulder pain 01/16/2022   Shoulder wound, left, initial encounter 01/16/2022   Depression    Chest pain 01/15/2022   History of pulmonary embolism 03/29/2021   Shoulder arthritis 02/27/2018   Morbid obesity ([place]) 07/24/2013   Expected blood loss anemia 07/24/2013   S/P right TKA 07/23/2013   AAA (abdominal aortic aneurysm) without rupture  03/18/2011   EDEMA 03/09/2009   HYPERCHOLESTEROLEMIA 03/06/2009   Essential hypertension 03/06/2009   Coronary atherosclerosis 03/06/2009   BUNDLE BRANCH BLOCK, RIGHT 03/06/2009   BRADYCARDIA 03/06/2009   PCP:  Carol Ada, MD Pharmacy:   [place] [address] Phone: [phone] Fax: [phone]     Social  Determinants of Health (SDOH) Interventions    Readmission Risk Interventions No flowsheet data found.

## 2022-01-18 NOTE — Brief Op Note (Signed)
° °  01/18/2022  9:10 PM  PATIENT:  Jeremiah [name]  71 y.o. male  PRE-OPERATIVE DIAGNOSIS: Left shoulder prosthetic joint infection l  POST-OPERATIVE DIAGNOSIS: Left shoulder prosthetic joint infection  PROCEDURE:  Procedure(s): REMOVAL REVERSE SHOULDER IMPLANT WITH ANTIBIOTIC SPACER, placement of negative pressure dressing, excision of sinus tract  SURGEON:  Surgeon(s): Marlou Sa, Tonna Corner, MD  ASSISTANT: Annie Main  ANESTHESIA:   general  EBL: 150 ml    Total I/O In: -  Out: 450 [Urine:250; Blood:200]  BLOOD ADMINISTERED: none  Hemovac drain in the left shoulder with suction on, Prevena wound VAC with suction on the anterior incision  LOCAL MEDICATIONS USED: Vancomycin and gentamicin impregnated spacer and cement  SPECIMEN: Cultures x3  COUNTS:  YES  TOURNIQUET:  * No tourniquets in log *  DICTATION: .[id number]  PLAN OF CARE: Admit to inpatient   PATIENT DISPOSITION:  PACU - hemodynamically stable

## 2022-01-18 NOTE — Anesthesia Procedure Notes (Signed)
Anesthesia Regional Block: Interscalene brachial plexus block   Pre-Anesthetic Checklist: , timeout performed,  Correct Patient, Correct Site, Correct Laterality,  Correct Procedure, Correct Position, site marked,  Risks and benefits discussed,  Surgical consent,  Pre-op evaluation,  At surgeon's request and post-op pain management  Laterality: Left  Prep: chloraprep       Needles:  Injection technique: Single-shot  Needle Type: Echogenic Stimulator Needle     Needle Length: 5cm  Needle Gauge: 22     Additional Needles:   Procedures:, nerve stimulator,,, ultrasound used (permanent image in chart),,     Nerve Stimulator or Paresthesia:  Response: hand, 0.45 mA  Additional Responses:   Narrative:  Start time: 01/18/2022 3:40 PM End time: 01/18/2022 3:45 PM Injection made incrementally with aspirations every 5 mL.  Performed by: Personally  Anesthesiologist: Janeece Riggers, MD  Additional Notes: Functioning IV was confirmed and monitors were applied.  A 66mm 22ga Arrow echogenic stimulator needle was used. Sterile prep and drape,hand hygiene and sterile gloves were used. Ultrasound guidance: relevant anatomy identified, needle position confirmed, local anesthetic spread visualized around nerve(s)., vascular puncture avoided.  Image printed for medical record. Negative aspiration and negative test dose prior to incremental administration of local anesthetic. The patient tolerated the procedure well.

## 2022-01-18 NOTE — Assessment & Plan Note (Addendum)
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  Hold AV nodal blockers.

## 2022-01-18 NOTE — Progress Notes (Signed)
Patient states he has no where to go after he is discharged.  His wife died a year ago.  I updated case management that he will need assistance with placement after discharge.

## 2022-01-18 NOTE — Anesthesia Procedure Notes (Signed)
Procedure Name: Intubation Date/Time: 01/18/2022 5:59 PM Performed by: Thelma Comp, CRNA Pre-anesthesia Checklist: Patient identified, Emergency Drugs available, Suction available and Patient being monitored Patient Re-evaluated:Patient Re-evaluated prior to induction Oxygen Delivery Method: Circle System Utilized Preoxygenation: Pre-oxygenation with 100% oxygen Induction Type: IV induction Ventilation: Mask ventilation without difficulty Laryngoscope Size: [name] and 4 Grade View: Grade I Tube type: Oral Number of attempts: 1 Airway Equipment and Method: Stylet Placement Confirmation: ETT inserted through vocal cords under direct vision, positive ETCO2 and breath sounds checked- equal and bilateral Secured at: 23 cm Tube secured with: Tape Dental Injury: Teeth and Oropharynx as per pre-operative assessment

## 2022-01-18 NOTE — Anesthesia Postprocedure Evaluation (Signed)
Anesthesia Post Note  Patient: Jeremiah [name]  Procedure(s) Performed: REMOVAL REVERSE SHOULDER IMPLANT WITH ANTIBIOTIC SPACER, placement of negative pressure dressing (Left: Shoulder)     Patient location during evaluation: PACU Anesthesia Type: General Level of consciousness: awake and alert Pain management: pain level controlled Vital Signs Assessment: post-procedure vital signs reviewed and stable Respiratory status: spontaneous breathing, nonlabored ventilation, respiratory function stable and patient connected to nasal cannula oxygen Cardiovascular status: blood pressure returned to baseline and stable Postop Assessment: no apparent nausea or vomiting Anesthetic complications: no   No notable events documented.  Last Vitals:  Vitals:   01/18/22 2210 01/18/22 2238  BP: (!) 94/53 95/65  Pulse: 88 65  Resp: 13 17  Temp:  (!) 36.4 C  SpO2: 99% 100%    Last Pain:  Vitals:   01/18/22 2238  TempSrc: Axillary  PainSc:                  Effie Berkshire

## 2022-01-18 NOTE — Progress Notes (Signed)
Patient is a 71 year old who underwent left reverse shoulder replacement about 4-1/2 years ago. He moved down to [place] after his wife died to live with his brother and his sister. Patient has had issues with alcohol use in the past. He sustained a fall approximately 3 months ago where he dislocated the left reverse replacement.  Prior to that it had been functioning very well.  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  Developed a chest wall hematoma on the left-hand side which required multiple I&D's and the reverse replacement on that side subsequently became infected as well.  Unclear why this was not treated in [place].  Patient had a falling out with his family and came up here for further treatment.  He has multiple medical comorbidities.  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  Plan at this time is implant removal with placement of antibiotic spacer.  This is a chronic infection and thus two-stage reimplantation is indicated.  He does have low albumin which over the next 8 weeks could hopefully be optimized prior to any reimplantation.  The risk and benefits of the procedure discussed with the patient including not limited to infection nerve vessel damage potential that this infection may not be able to be cured and his current metabolic state.  That would result in resection arthroplasty.  Patient understands risk and benefits.  Currently he is homeless and this will be another social complicating factor for optimal treatment in this case.  All questions answered.

## 2022-01-18 NOTE — Progress Notes (Addendum)
Progress Note  Patient Name: Jeremiah [name] Date of Encounter: 01/18/2022  [facility] Cardiologist: Jenkins Rouge, MD   Subjective   Pt is NPO for shoulder surgery this afternoon. He states he doesn't know what's going on. No cardiac complaints.  Inpatient Medications    Scheduled Meds:  [START ON 01/19/2022] allopurinol  100 mg Oral Daily   [START ON 01/19/2022] aspirin EC  81 mg Oral Daily   buPROPion  300 mg Oral q morning   busPIRone  15 mg Oral TID   Chlorhexidine Gluconate Cloth  6 each Topical Q0600   furosemide  40 mg Intravenous Daily   gabapentin  300 mg Oral BID   hydrOXYzine  25 mg Oral TID   melatonin  3 mg Oral QHS   mupirocin ointment  1 application Nasal BID   QUEtiapine  50 mg Oral QHS   rosuvastatin  40 mg Oral Daily   vitamin B-12  1,000 mcg Oral Daily   Continuous Infusions:  vancomycin 1,250 mg (01/17/22 2115)   PRN Meds: acetaminophen, ALPRAZolam, fentaNYL, hydrOXYzine, lidocaine-EPINEPHrine, oxyCODONE   Vital Signs    Vitals:   01/17/22 0813 01/17/22 1530 01/17/22 2014 01/18/22 0551  BP: 118/72 126/72 108/62 110/63  Pulse: 65 64 63 65  Resp: 18 16 18 20   Temp:  98.1 F (36.7 C) 98 F (36.7 C) 97.7 F (36.5 C)  TempSrc:  Oral Oral   SpO2: 100% 100% 99% 100%  Weight:    89.2 kg  Height:        Intake/Output Summary (Last 24 hours) at 01/18/2022 1116 Last data filed at 01/18/2022 1000 Gross per 24 hour  Intake 720 ml  Output 3875 ml  Net -3155 ml   Last 3 Weights 01/18/2022 01/17/2022 01/16/2022  Weight (lbs) 196 lb 10.4 oz 187 lb 13.3 oz 190 lb 0.6 oz  Weight (kg) 89.2 kg 85.2 kg 86.2 kg      Telemetry    Atrial flutter rate controlled in the 70s - Personally Reviewed  ECG    No new tracings - Personally Reviewed  Physical Exam   GEN: No acute distress.   Neck: + JVD Cardiac: RRR, no murmurs, rubs, or gallops.  Respiratory: crackles throughout GI: Soft, nontender, non-distended  MS: 1+ BLE edema; No deformity. Neuro:   Nonfocal  Psych: Normal affect   Labs    High Sensitivity Troponin:   Recent Labs  Lab 01/15/22 2130 01/15/22 2214  TROPONINIHS 15 17     Chemistry Recent Labs  Lab 01/15/22 2130 01/17/22 0231 01/18/22 0431 01/18/22 0822  NA 137 134*  --  135  K 4.3 4.3  --  4.0  CL 108 107  --  106  CO2 20* 23  --  24  GLUCOSE 75 79  --  138*  BUN 21 29*  --  27*  CREATININE 0.95 1.28* 0.96 0.89  CALCIUM 8.5* 7.9*  --  7.9*  MG  --  1.9  --  1.7  PROT 6.1*  --   --   --   ALBUMIN 2.8* 2.3*  --   --   AST 22  --   --   --   ALT 16  --   --   --   ALKPHOS 95  --   --   --   BILITOT 0.9  --   --   --   GFRNONAA >60 >60 >60 >60  ANIONGAP 9 4*  --  5  Lipids No results for input(s): CHOL, TRIG, HDL, LABVLDL, LDLCALC, CHOLHDL in the last 168 hours.  Hematology Recent Labs  Lab 01/15/22 2130 01/16/22 0530  WBC 5.1 3.9*  RBC 3.47* 3.39*  HGB 9.1* 8.9*  HCT 30.6* 29.8*  MCV 88.2 87.9  MCH 26.2 26.3  MCHC 29.7* 29.9*  RDW 18.0* 17.9*  PLT 238 252   Thyroid  Recent Labs  Lab 01/16/22 0255  TSH 1.588    BNP Recent Labs  Lab 01/15/22 2130  BNP 498.1*    DDimer  Recent Labs  Lab 01/16/22 0255  DDIMER 1.13*     Radiology    CT Angio Chest Pulmonary Embolism (PE) W or WO Contrast  Result Date: 01/16/2022 CLINICAL DATA:  Fall, chest pain. Pulmonary embolism (PE) suspected, positive D-dimer. History of PE EXAM: CT ANGIOGRAPHY CHEST WITH CONTRAST TECHNIQUE: Multidetector CT imaging of the chest was performed using the standard protocol during bolus administration of intravenous contrast. Multiplanar CT image reconstructions and MIPs were obtained to evaluate the vascular anatomy. RADIATION DOSE REDUCTION: This exam was performed according to the departmental dose-optimization program which includes automated exposure control, adjustment of the mA and/or kV according to patient size and/or use of iterative reconstruction technique. CONTRAST:  41mL OMNIPAQUE IOHEXOL 350 MG/ML  SOLN COMPARISON:  04/06/2021 FINDINGS: Cardiovascular: Satisfactory opacification of the pulmonary arteries to the segmental level. Evaluation of the more distal arterial branches is degraded by respiratory motion artifact. Within this limitation, there is no evidence of pulmonary embolism. Main pulmonary trunk measures 3.8 cm in diameter. Mid ascending thoracic aorta measures 4.7 cm in diameter. Scattered atherosclerotic calcifications of the aorta and coronary arteries. Cardiomegaly. Previous CABG. No pericardial effusion. Mediastinum/Nodes: No enlarged mediastinal, hilar, or axillary lymph nodes. Thyroid gland, trachea, and esophagus demonstrate no significant findings. Lungs/Pleura: Trace bilateral pleural effusions with associated bibasilar subsegmental atelectasis. Previously seen right upper lobe pulmonary nodule has resolved. No pneumothorax. Upper Abdomen: Reflux of contrast into the IVC and hepatic veins. Postsurgical changes at the gastroesophageal junction. No acute findings are evident within the included upper abdomen. Musculoskeletal: Dislocated left shoulder arthroplasty. Additional findings involving the left shoulder will be dictated on dedicated left shoulder CT. Severe osteoarthritis of the right shoulder with associated joint effusion. Degenerative changes of the thoracic spine without fracture. Review of the MIP images confirms the above findings. IMPRESSION: 1. Negative for pulmonary embolism. 2. Trace bilateral pleural effusions with associated bibasilar subsegmental atelectasis. 3. Cardiomegaly with reflux of contrast into the IVC and hepatic veins, suggesting right heart dysfunction. 4. Dilated main pulmonary trunk suggesting pulmonary arterial hypertension. 5. Dislocated left shoulder arthroplasty with additional findings involving the left shoulder will be dictated on dedicated left shoulder CT. 6. Thoracic aortic aneurysm measuring up to 4.7 cm. Recommend semi-annual imaging followup by  CTA or MRA and referral to cardiothoracic surgery if not already obtained. This recommendation follows 2010 ACCF/AHA/AATS/ACR/ASA/SCA/SCAI/SIR/STS/SVM Guidelines for the Diagnosis and Management of Patients With Thoracic Aortic Disease. Circulation. 2010; 121: G665-L935. Aortic aneurysm NOS (ICD10-I71.9) 7. Aortic and coronary artery atherosclerosis (ICD10-I70.0). Electronically Signed   By: Davina Poke D.O.   On: 01/16/2022 14:43   CT SHOULDER LEFT W CONTRAST  Result Date: 01/16/2022 CLINICAL DATA:  Dislocated left shoulder arthroplasty EXAM: CT OF THE UPPER LEFT EXTREMITY WITH CONTRAST TECHNIQUE: Multidetector CT imaging of the left shoulder was performed according to the standard protocol following intravenous contrast administration. RADIATION DOSE REDUCTION: This exam was performed according to the departmental dose-optimization program which includes automated exposure control,  adjustment of the mA and/or kV according to patient size and/or use of iterative reconstruction technique. CONTRAST:  80mL OMNIPAQUE IOHEXOL 350 MG/ML SOLN COMPARISON:  X-ray 01/15/2022, 01/25/2021.  CT 02/04/2021 FINDINGS: Bones/Joint/Cartilage Postsurgical changes from previous reverse left shoulder arthroplasty. The humeral component is anteromedially displaced relative to the glenoid component. Humeral component is perched along the anteroinferior aspect of the mid clavicle. There is a component of cortical thickening and callus formation along the left clavicular diaphysis, new from 02/04/2021 (series 8, image 19) raising the possibility of subacute chronicity of dislocation. Components of the hardware appear intact. No periprosthetic lucency or fracture is evident within the limitations of the exam which is slightly degraded by metallic streak artifact. Ligaments Suboptimally assessed by CT. Muscles and Tendons Rotator cuff muscle atrophy. Soft tissues Large periprosthetic fluid collection surrounding the proximal humerus  and extending throughout the left axillary region with maximal transverse measurements of approximately 7.8 x 4.4 cm (series 11, image 41) and extending approximately 8.8 cm in craniocaudal dimension (series 13, image 55). Visualized portion of the collection has a well-defined, slightly thickened wall. IMPRESSION: 1. Postsurgical changes from previous reverse left shoulder arthroplasty. The humeral component is anteromedially displaced relative to the glenoid component. Humeral component is perched along the anteroinferior aspect of the mid clavicle. 2. There is a component of cortical thickening and callus formation along the left clavicular diaphysis, new from 02/04/2021 raising the possibility of subacute chronicity of the dislocation. 3. Large periprosthetic fluid collection surrounding the proximal humerus and extending throughout the left axillary region measuring approximately 7.8 x 4.4 x 8.8 cm. Visualized portion of the collection has a well-defined, slightly thickened wall. Findings could represent sequela of postoperative seroma, hematoma, versus particle disease. Electronically Signed   By: Davina Poke D.O.   On: 01/16/2022 14:55   CT US GUIDED BIOPSY  Result Date: 01/17/2022 INDICATION: 71 year old male with history of left total shoulder arthroplasty complicated by dislocation and left axillary fluid collection. Aspiration of fluid collection is requested as operative plan is formulated. EXAM: Ultrasound-guided left axillary fluid collection aspiration MEDICATIONS: The patient is currently admitted to the hospital and receiving intravenous antibiotics. The antibiotics were administered within an appropriate time frame prior to the initiation of the procedure. ANESTHESIA/SEDATION: Moderate (conscious) sedation was employed during this procedure. A total of Versed 0 mg and Fentanyl 50 mcg was administered intravenously by the radiology nurse. Total intra-service moderate Sedation Time: 0  minutes. The patient's level of consciousness and vital signs were monitored continuously by radiology nursing throughout the procedure under my direct supervision. COMPLICATIONS: None immediate. PROCEDURE: Informed written consent was obtained from the patient after a thorough discussion of the procedural risks, benefits and alternatives. All questions were addressed. Maximal Sterile Barrier Technique was utilized including caps, mask, sterile gowns, sterile gloves, sterile drape, hand hygiene and skin antiseptic. A timeout was performed prior to the initiation of the procedure. Preprocedure CT evaluation demonstrated a left actually fluid collection similar to comparison study which measured up to approximately 7.3 x 3.6 cm in maximum axial dimensions. CT guidance was used to target ultrasound-guided evaluation. Preprocedure ultrasound demonstrated phlegmonous changes about the left anterior axilla with central anechoic portions which in hole abutted the indwelling left shoulder arthroplasty hardware. The procedure was planned. The left anterior shoulder was prepped and draped in standard fashion. Subdermal Local anesthesia was administered at the planned needle entry site. Under direct ultrasound visualization, a 10 cm, 5 [place] Yueh catheter was directed into the deepest aspect of  the left axillary fluid collection. The needle was then removed from the outer cannula and aspiration was performed which yielded approximately 30 mL of cloudy, yellow aspirate. After the Yueh catheter was removed, ultrasound evaluation demonstrated trace residual fluid within the persistent phlegmonous structure. Hemostasis was achieved with brief manual compression and a bandage was applied. The patient tolerated procedure well was transferred back to the floor in stable condition. IMPRESSION: Technically successful CT and ultrasound guided left anterior axillary fluid collection aspiration yielding approximately 30 mL of cloudy,  yellow aspirate. Sample was sent to the lab for culture. Ruthann Cancer, MD [facility] Electronically Signed   By: Ruthann Cancer M.D.   On: 01/17/2022 16:10    Cardiac Studies   Echo 01/16/22: 1. Left ventricular ejection fraction, by estimation, is 35 to 40%. The  left ventricle has moderately decreased function. The left ventricle  demonstrates global hypokinesis. The left ventricular internal cavity size  was mildly dilated. There is mild  left ventricular hypertrophy. Left ventricular diastolic parameters are  indeterminate.   2. Right ventricular systolic function is normal. The right ventricular  size is normal. There is normal pulmonary artery systolic pressure.   3. Left atrial size was moderately dilated.   4. The mitral valve is normal in structure. Mild mitral valve  regurgitation. No evidence of mitral stenosis.   5. Tricuspid valve regurgitation is mild to moderate.   6. The aortic valve is tricuspid. Aortic valve regurgitation is mild.  Aortic valve sclerosis is present, with no evidence of aortic valve  stenosis.   7. Aortic dilatation noted. There is mild dilatation of the ascending  aorta, measuring 43 mm.   8. The inferior vena cava is dilated in size with >50% respiratory  variability, suggesting right atrial pressure of 8 mmHg.      Echo 07/02/21: 1.  Normal left ventricular size, generalized hypokinesis with reduced  ejection fraction of 30 to 35%  2.  Moderate concentric left ventricular hypertrophy  3.  Moderate to severe aortic regurgitation  4.  Enlarged aortic root, 4.27 cm  5.  Mild diastolic flow reversal demonstrated in the descending aorta     Nuclear stress test 09/08/21: Stress Function Comments  Left ventricular function post-stress is abnormal. Global function is mildly reduced. Post-stress ejection fraction is 46 %.  Patient Profile     72 y.o. male 71 y.o. male with a hx of CAD, s/p  CABG, polysubstance abuse, prior falls, GERD, HTN, HLD, chronic systolic heart failure with moderate to severe AI, and RBBB who was seen 01/16/2022 for the evaluation of atrial flutter.  Assessment & Plan    Typical atrial flutter  - rate controlled without AV nodal agents - not a candidate for [facility] and therefore not a candidate for DCCV or ablation - moderately dilated left atrium on echo - 3 sec pause Monday 0359, no further pauses noted on telemetry - have held off on BB given pause, continue to monitor   Need for chronic anticoagulation - hx of large chest wall hematoma after a fall in 10/2021 requiring multiple I&Ds and wound vac in Jan - not a good candidate for [facility] given polysubstance abuse, falls, and homelessness - since he is not a good candidate for [facility], this limits his options for rhythm control   Acute on chronic combined heart failure Aortic insufficiency Aortic root 4.27 cm Medication nonadherence - echo this admission with LVEF 35-40%, up from echo 07/02/21 with LVEF 30-35% -  he did not take medications for about one month prior to admission - diuresing on 40 mg IV lasix with 4.1 L urine output yesterday - diuresing very well - although this is not reflected in his weight - continue this dose of diuresis - renal function improved, but unable to add spiro due to BP   AKI Now resolved   Anemia  Hb 8.9 (9.1) - no signs of active bleeding, not on OAC - per primary   CAD s/p CABG x 4 (LIMA-LAD, SVG-D1, SVG-OM1-OM2, SVG-PDA) - 1998 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 - nonischemic nuclear stress test 09/08/21 with EF estimated at 46% - continue ASA and statin   Chronically dislocated left shoulder - In 10/2021 - wound cultures with MRSA - ?surgery this afternoon   Hx of PE Elevated D-dimer - CTA 02/06/21 with large burden of PE with central pulmonary artery distribution - pulmonology placed on eliquis, but now off - noted this was in the  setting of lowe extremity cellulitis and minimal mobility - CTA this admission negative for PE - B LE swelling - appears to be volume and bilateral - but consider ruling out DVT   Thoracic aortic aneurysm - 4.7 cm on CTA yesterday - recommend repeat imiagin in 6-12 months     Disposition Alcohol abuse His brother and sister-in-law have refused to help the patient given history of alcohol abuse. He was discharged to a homeless shelter prior to leaving [place] followed last ER visit. - he is currently homeless, not living in a shelter - he states his last drink was 2 weeks ago     For questions or updates, please contact [place] Please consult [email or web address] for contact info under        Signed, Ledora Bottcher, PA  01/18/2022, 11:16 AM    Patient seen and examined.  Agree with above documentation.  On exam, patient is alert and oriented, regular rate and rhythm, no murmurs, lungs CTAB, 1+ BLE edema.  Excellent diuresis yesterday, was -3.1 L on IV Lasix 40 mg twice daily.  Labs this morning show improvement in creatinine (1.28 > 0.89).  He denies any chest pain or dyspnea.  Telemetry shows rate controlled atrial flutter.  Continue IV diuresis.  Orthopedic surgery planning shoulder surgery today for his dislocated shoulder and wound with drainage.  He is at elevated risk given his history of heart failure, but appears compensated now that he has diuresed well.  Denies any anginal symptoms.  No further cardiac work-up recommended prior to procedure  Donato Heinz, MD

## 2022-01-18 NOTE — Transfer of Care (Signed)
Immediate Anesthesia Transfer of Care Note  Patient: Jeremiah [name]  Procedure(s) Performed: REMOVAL REVERSE SHOULDER IMPLANT WITH ANTIBIOTIC SPACER, placement of negative pressure dressing (Left: Shoulder)  Patient Location: PACU  Anesthesia Type:GA combined with regional for post-op pain  Level of Consciousness: awake, alert  and oriented  Airway & Oxygen Therapy: Patient Spontanous Breathing and Patient connected to nasal cannula oxygen  Post-op Assessment: Report given to RN, Post -op Vital signs reviewed and stable and Patient moving all extremities  Post vital signs: Reviewed and stable  Last Vitals:  Vitals Value Taken Time  BP 107/63 01/18/22 2109  Temp    Pulse 63 01/18/22 2111  Resp 14 01/18/22 2111  SpO2 100 % 01/18/22 2111  Vitals shown include unvalidated device data.  Last Pain:  Vitals:   01/18/22 1455  TempSrc: Oral  PainSc: 8       Patients Stated Pain Goal: 0 (50/38/88 2800)  Complications: No notable events documented.

## 2022-01-18 NOTE — Plan of Care (Signed)
  Problem: Education: Goal: Knowledge of General Education information will improve Description Including pain rating scale, medication(s)/side effects and non-pharmacologic comfort measures Outcome: Progressing   Problem: Health Behavior/Discharge Planning: Goal: Ability to manage health-related needs will improve Outcome: Progressing   

## 2022-01-18 NOTE — TOC Progression Note (Signed)
Transition of Care [facility]) - Progression Note    Patient Details  Name: Jeremiah [name] MRN: [id number] Date of Birth: 1951/10/12  [facility]) CM/SW Contact  Johnika Escareno, Juliann Pulse, RN Phone Number: 01/18/2022, 3:39 PM  Clinical Narrative:  Noted-can provide shelter resource for day shelter-[facility]-must d/c by 11a to get their timely.     Expected Discharge Plan: Homeless Shelter Barriers to Discharge: Continued Medical Work up  Expected Discharge Plan and Services Expected Discharge Plan: Homeless Shelter   Discharge Planning Services: CM Consult   Living arrangements for the past 2 months: Single Family Home                                       Social Determinants of Health (SDOH) Interventions    Readmission Risk Interventions No flowsheet data found.

## 2022-01-18 NOTE — Anesthesia Preprocedure Evaluation (Addendum)
Anesthesia Evaluation  Patient identified by MRN, date of birth, ID band Patient awake    Reviewed: Allergy & Precautions, NPO status , Patient's Chart, lab work & pertinent test results  Airway Mallampati: I  TM Distance: >3 FB Neck ROM: Full    Dental  (+) Edentulous Upper, Edentulous Lower   Pulmonary    breath sounds clear to auscultation       Cardiovascular hypertension, + CAD and +CHF  + dysrhythmias  Rhythm:Regular Rate:Normal     Neuro/Psych    GI/Hepatic   Endo/Other    Renal/GU      Musculoskeletal  (+) Arthritis , Osteoarthritis,    Abdominal   Peds  Hematology  (+) Blood dyscrasia, anemia ,   Anesthesia Other Findings   Reproductive/Obstetrics                            Anesthesia Physical  Anesthesia Plan  ASA: 4 and emergent  Anesthesia Plan: General and Regional   Post-op Pain Management: Minimal or no pain anticipated and Regional block*   Induction: Intravenous  PONV Risk Score and Plan: 2 and Ondansetron and Dexamethasone  Airway Management Planned: Oral ETT  Additional Equipment: None  Intra-op Plan:   Post-operative Plan: Extubation in OR  Informed Consent: I have reviewed the patients History and Physical, chart, labs and discussed the procedure including the risks, benefits and alternatives for the proposed anesthesia with the patient or authorized representative who has indicated his/her understanding and acceptance.     Dental advisory given  Plan Discussed with: CRNA and Anesthesiologist  Anesthesia Plan Comments: (HPI: Jeremiah [name] is a 71 y.o. male with medical history significant of CAD s/p bypass in 1998,Hx of PE and DVT on Eliquis, HTN, alcohol abuse with hx of withdrawal, opioid dependency previous on Suboxone, presents with acute onset left shoulder pain and left-sided neck pain.  Patient is a limited historian.  Frustrated that  questions are being asked.Has not been on any of his medications.  Reportedly patient lost his wife last year and moved out to [place] to live with his brother.  However did not get along with his sister-in-law and has recently moved back here to [place] last week.  And noted to be homeless and living in [place]. Noted to have multiple admissions recently in [place] for alcohol withdrawal.  In 10/2021 he was admitted following a fall and was noted to have a large left-sided chest wall hematoma.  He subsequently underwent multiple I&D and wound VAC in January due to persistent refractory hematoma.  Noted to have chronically dislocated left shoulder arthroplasty in the setting of likely infection.  Wound culture revealed MRSA however does not appear he has been taking his antibiotic.)       Anesthesia Quick Evaluation

## 2022-01-19 DIAGNOSIS — Z86711 Personal history of pulmonary embolism: Secondary | ICD-10-CM

## 2022-01-19 DIAGNOSIS — I483 Typical atrial flutter: Secondary | ICD-10-CM

## 2022-01-19 DIAGNOSIS — S41002A Unspecified open wound of left shoulder, initial encounter: Secondary | ICD-10-CM | POA: Diagnosis not present

## 2022-01-19 DIAGNOSIS — F1010 Alcohol abuse, uncomplicated: Secondary | ICD-10-CM | POA: Diagnosis not present

## 2022-01-19 DIAGNOSIS — E538 Deficiency of other specified B group vitamins: Secondary | ICD-10-CM

## 2022-01-19 DIAGNOSIS — I5043 Acute on chronic combined systolic (congestive) and diastolic (congestive) heart failure: Secondary | ICD-10-CM | POA: Diagnosis not present

## 2022-01-19 LAB — CBC
HCT: 30 % — ABNORMAL LOW (ref 39.0–52.0)
Hemoglobin: 9.1 g/dL — ABNORMAL LOW (ref 13.0–17.0)
MCH: 26.5 pg (ref 26.0–34.0)
MCHC: 30.3 g/dL (ref 30.0–36.0)
MCV: 87.2 fL (ref 80.0–100.0)
Platelets: 309 10*3/uL (ref 150–400)
RBC: 3.44 MIL/uL — ABNORMAL LOW (ref 4.22–5.81)
RDW: 17.7 % — ABNORMAL HIGH (ref 11.5–15.5)
WBC: 9.3 10*3/uL (ref 4.0–10.5)
nRBC: 0 % (ref 0.0–0.2)

## 2022-01-19 LAB — BASIC METABOLIC PANEL
Anion gap: 8 (ref 5–15)
BUN: 23 mg/dL (ref 8–23)
CO2: 27 mmol/L (ref 22–32)
Calcium: 7.8 mg/dL — ABNORMAL LOW (ref 8.9–10.3)
Chloride: 104 mmol/L (ref 98–111)
Creatinine, Ser: 0.9 mg/dL (ref 0.61–1.24)
GFR, Estimated: >60 mL/min (ref >60)
Glucose, Bld: 133 mg/dL — ABNORMAL HIGH (ref 70–99)
Potassium: 4 mmol/L (ref 3.5–5.1)
Sodium: 139 mmol/L (ref 135–145)

## 2022-01-19 MED ORDER — VANCOMYCIN HCL 750 MG/150ML IV SOLN
750.0000 mg | Freq: Two times a day (BID) | INTRAVENOUS | Status: DC
  Administered 2022-01-19 – 2022-01-27 (×19): 750 mg via INTRAVENOUS
  Filled 2022-01-19 (×19): qty 150

## 2022-01-19 MED ORDER — FUROSEMIDE 40 MG PO TABS
40.0000 mg | ORAL_TABLET | Freq: Every day | ORAL | Status: DC
  Administered 2022-01-19: 40 mg via ORAL
  Filled 2022-01-19: qty 1

## 2022-01-19 MED ORDER — OXYCODONE HCL 5 MG PO TABS
7.5000 mg | ORAL_TABLET | ORAL | Status: DC | PRN
Start: 2022-01-19 — End: 2022-01-21
  Administered 2022-01-19 – 2022-01-21 (×7): 7.5 mg via ORAL
  Filled 2022-01-19 (×7): qty 2

## 2022-01-19 NOTE — Progress Notes (Signed)
?  Subjective: ?Jeremiah [name] is a 71 y.o. male s/p left RSA implant removal with irrigation and debridement of infected shoulder joint with placement of antibiotic spacer.  They are POD 1.  Pt's pain is controlled overall.  Denies any chest pain or shortness of breath.  He has been up and walking.  Remaining in sling.  Denies any feeling of instability of the shoulder. No fevers, chills, drainage.   ? ?Objective: ?Vital signs in last 24 hours: ?Temp:  [97.5 ?F (36.4 ?C)-98.7 ?F (37.1 ?C)] 97.8 ?F (36.6 ?C) (03/01 0350) ?Pulse Rate:  [60-88] 66 (03/01 0350) ?Resp:  [7-20] 17 (03/01 0350) ?BP: (94-127)/(53-72) 97/59 (03/01 0350) ?SpO2:  [96 %-100 %] 98 % (03/01 0350) ?Weight:  [89.2 kg-89.8 kg] 89.8 kg (03/01 0500) ? ?Intake/Output from previous day: ?02/28 0701 - 03/01 0700 ?In: 47 [P.O.:360; I.V.:97] ?Out: 1660 [Urine:3950; Drains:10; Blood:200] ?Intake/Output this shift: ?No intake/output data recorded. ? ?Exam: ? ?No gross blood or drainage overlying the dressing ?2+ radial pulse ?Sensation intact distally in the left hand ?Able to extend the left wrist. Intact EPL, thumb IP Flexion, finger adduction. Axillary nerve intact with deltoid firing ?Hemovac drain in place with sanguinous drainage.  No purulence noted ? ? ?Labs: ?Recent Labs  ?  01/18/22 ?2246 01/19/22 ?0328  ?HGB 9.1* 9.1*  ? ?Recent Labs  ?  01/18/22 ?2246 01/19/22 ?0328  ?WBC 10.0 9.3  ?RBC 3.50* 3.44*  ?HCT 31.1* 30.0*  ?PLT 275 309  ? ?Recent Labs  ?  01/18/22 ?0822 01/18/22 ?2246 01/19/22 ?6004  ?NA 135  --  139  ?K 4.0  --  4.0  ?CL 106  --  104  ?CO2 24  --  27  ?BUN 27*  --  23  ?CREATININE 0.89 0.91 0.90  ?GLUCOSE 138*  --  133*  ?CALCIUM 7.9*  --  7.8*  ? ?No results for input(s): LABPT, INR in the last 72 hours. ? ?Assessment/Plan: ?Pt is POD 1 s/p left shoulder implant removal with irrigation debridement and placement of antibiotic spacer ? -Remain in sling at all times with no lifting of the operative extremity. ? -Hemovac drain removed  today. ? -Continue to follow intraoperative cultures; no growth yet ? -Appreciate infectious disease consult and management by hospitalist and cardiology teams. ? -PICC line placed for IV antibiotic administration.  IV antibiotics per ID. ? -Restart Lovenox at [facility] today as it was ordered to be started prior to procedure ? ? ?Jeremiah [name] ?01/19/2022, 8:12 AM  ? ? ?   ?

## 2022-01-19 NOTE — Progress Notes (Signed)
Progress Note  Patient Name: Jeremiah [name] Date of Encounter: 01/19/2022  [facility] Cardiologist: Jenkins Rouge, MD   Subjective   Underwent shoulder surgery yesterday.  Net negative 3.7L, negative 9.6L on admission.  BP 95/57 this morning.  Cr stable at 0.9.  Reports shoulder pain this morning.  Denies any dyspnea or chest pain.  Inpatient Medications    Scheduled Meds:  allopurinol  100 mg Oral Daily   aspirin EC  81 mg Oral Daily   buPROPion  300 mg Oral q morning   busPIRone  15 mg Oral TID   docusate sodium  100 mg Oral BID   enoxaparin (LOVENOX) injection  40 mg Subcutaneous Q24H   fentaNYL       furosemide  40 mg Intravenous Daily   gabapentin  300 mg Oral BID   hydrOXYzine  25 mg Oral TID   magnesium oxide  400 mg Oral Once   melatonin  3 mg Oral QHS   mupirocin ointment  1 application Nasal BID   QUEtiapine  50 mg Oral QHS   rosuvastatin  40 mg Oral Daily   vitamin B-12  1,000 mcg Oral Daily   Continuous Infusions:  sodium chloride 10 mL/hr at 01/18/22 2302   lactated ringers 10 mL/hr at 01/18/22 1456   vancomycin 1,250 mg (01/17/22 2115)   PRN Meds: acetaminophen, ALPRAZolam, hydrOXYzine, menthol-cetylpyridinium **OR** phenol, metoCLOPramide **OR** metoCLOPramide (REGLAN) injection, oxyCODONE   Vital Signs    Vitals:   01/19/22 0350 01/19/22 0350 01/19/22 0500 01/19/22 0832  BP: (!) 97/59 (!) 97/59  (!) 95/57  Pulse: 66 66  (!) 53  Resp: 17 17    Temp: 97.8 F (36.6 C) 97.8 F (36.6 C)  98.2 F (36.8 C)  TempSrc: Oral Oral  Oral  SpO2: 98%   100%  Weight:   89.8 kg   Height:        Intake/Output Summary (Last 24 hours) at 01/19/2022 0905 Last data filed at 01/19/2022 0400 Gross per 24 hour  Intake 457.04 ml  Output 4160 ml  Net -3702.96 ml    Last 3 Weights 01/19/2022 01/18/2022 01/18/2022  Weight (lbs) 197 lb 15.6 oz 196 lb 10.4 oz 196 lb 10.4 oz  Weight (kg) 89.8 kg 89.2 kg 89.2 kg      Telemetry    Atrial flutter, up to 130s briefly  overnight but currently in 60s personally Reviewed  ECG    No new tracings - Personally Reviewed  Physical Exam   GEN: No acute distress.   Neck: No JVD Cardiac: RRR, no murmurs, rubs, or gallops.  Respiratory: crackles throughout GI: Soft, nontender, non-distended  MS: trace BLE edema; No deformity. Neuro:  Nonfocal  Psych: Normal affect   Labs    High Sensitivity Troponin:   Recent Labs  Lab 01/15/22 2130 01/15/22 2214  TROPONINIHS 15 17      Chemistry Recent Labs  Lab 01/15/22 2130 01/17/22 0231 01/18/22 0431 01/18/22 0822 01/18/22 2246 01/19/22 0544  NA 137 134*  --  135  --  139  K 4.3 4.3  --  4.0  --  4.0  CL 108 107  --  106  --  104  CO2 20* 23  --  24  --  27  GLUCOSE 75 79  --  138*  --  133*  BUN 21 29*  --  27*  --  23  CREATININE 0.95 1.28*   < > 0.89 0.91 0.90  CALCIUM 8.5*  7.9*  --  7.9*  --  7.8*  MG  --  1.9  --  1.7  --   --   PROT 6.1*  --   --   --   --   --   ALBUMIN 2.8* 2.3*  --   --   --   --   AST 22  --   --   --   --   --   ALT 16  --   --   --   --   --   ALKPHOS 95  --   --   --   --   --   BILITOT 0.9  --   --   --   --   --   GFRNONAA >60 >60   < > >60 >60 >60  ANIONGAP 9 4*  --  5  --  8   < > = values in this interval not displayed.     Lipids No results for input(s): CHOL, TRIG, HDL, LABVLDL, LDLCALC, CHOLHDL in the last 168 hours.  Hematology Recent Labs  Lab 01/16/22 0530 01/18/22 2246 01/19/22 0328  WBC 3.9* 10.0 9.3  RBC 3.39* 3.50* 3.44*  HGB 8.9* 9.1* 9.1*  HCT 29.8* 31.1* 30.0*  MCV 87.9 88.9 87.2  MCH 26.3 26.0 26.5  MCHC 29.9* 29.3* 30.3  RDW 17.9* 17.7* 17.7*  PLT 252 275 309    Thyroid  Recent Labs  Lab 01/16/22 0255  TSH 1.588     BNP Recent Labs  Lab 01/15/22 2130  BNP 498.1*     DDimer  Recent Labs  Lab 01/16/22 0255  DDIMER 1.13*      Radiology    DG Shoulder Left Port  Result Date: 01/18/2022 CLINICAL DATA:  Left shoulder arthroplasty removal EXAM: LEFT SHOULDER  COMPARISON:  01/15/2022 FINDINGS: Frontal view of the left shoulder demonstrates interval removal of the left shoulder arthroplasty, with methylmethacrylate placed within the left humerus. Surgical drain identified. No acute fractures. IMPRESSION: 1. Removal of left shoulder arthroplasty with methylmethacrylate placement as above. Electronically Signed   By: Randa Ngo M.D.   On: 01/18/2022 22:27   [place] EKG SITE RITE  Result Date: 01/18/2022 If [facility] image not attached, placement could not be confirmed due to current cardiac rhythm.  CT US GUIDED BIOPSY  Result Date: 01/17/2022 INDICATION: 71 year old male with history of left total shoulder arthroplasty complicated by dislocation and left axillary fluid collection. Aspiration of fluid collection is requested as operative plan is formulated. EXAM: Ultrasound-guided left axillary fluid collection aspiration MEDICATIONS: The patient is currently admitted to the hospital and receiving intravenous antibiotics. The antibiotics were administered within an appropriate time frame prior to the initiation of the procedure. ANESTHESIA/SEDATION: Moderate (conscious) sedation was employed during this procedure. A total of Versed 0 mg and Fentanyl 50 mcg was administered intravenously by the radiology nurse. Total intra-service moderate Sedation Time: 0 minutes. The patient's level of consciousness and vital signs were monitored continuously by radiology nursing throughout the procedure under my direct supervision. COMPLICATIONS: None immediate. PROCEDURE: Informed written consent was obtained from the patient after a thorough discussion of the procedural risks, benefits and alternatives. All questions were addressed. Maximal Sterile Barrier Technique was utilized including caps, mask, sterile gowns, sterile gloves, sterile drape, hand hygiene and skin antiseptic. A timeout was performed prior to the initiation of the procedure. Preprocedure CT evaluation  demonstrated a left actually fluid collection similar to comparison study which measured up to  approximately 7.3 x 3.6 cm in maximum axial dimensions. CT guidance was used to target ultrasound-guided evaluation. Preprocedure ultrasound demonstrated phlegmonous changes about the left anterior axilla with central anechoic portions which in hole abutted the indwelling left shoulder arthroplasty hardware. The procedure was planned. The left anterior shoulder was prepped and draped in standard fashion. Subdermal Local anesthesia was administered at the planned needle entry site. Under direct ultrasound visualization, a 10 cm, 5 [place] Yueh catheter was directed into the deepest aspect of the left axillary fluid collection. The needle was then removed from the outer cannula and aspiration was performed which yielded approximately 30 mL of cloudy, yellow aspirate. After the Yueh catheter was removed, ultrasound evaluation demonstrated trace residual fluid within the persistent phlegmonous structure. Hemostasis was achieved with brief manual compression and a bandage was applied. The patient tolerated procedure well was transferred back to the floor in stable condition. IMPRESSION: Technically successful CT and ultrasound guided left anterior axillary fluid collection aspiration yielding approximately 30 mL of cloudy, yellow aspirate. Sample was sent to the lab for culture. Ruthann Cancer, MD [facility] Electronically Signed   By: Ruthann Cancer M.D.   On: 01/17/2022 16:10    Cardiac Studies   Echo 01/16/22: 1. Left ventricular ejection fraction, by estimation, is 35 to 40%. The  left ventricle has moderately decreased function. The left ventricle  demonstrates global hypokinesis. The left ventricular internal cavity size  was mildly dilated. There is mild  left ventricular hypertrophy. Left ventricular diastolic parameters are  indeterminate.   2. Right  ventricular systolic function is normal. The right ventricular  size is normal. There is normal pulmonary artery systolic pressure.   3. Left atrial size was moderately dilated.   4. The mitral valve is normal in structure. Mild mitral valve  regurgitation. No evidence of mitral stenosis.   5. Tricuspid valve regurgitation is mild to moderate.   6. The aortic valve is tricuspid. Aortic valve regurgitation is mild.  Aortic valve sclerosis is present, with no evidence of aortic valve  stenosis.   7. Aortic dilatation noted. There is mild dilatation of the ascending  aorta, measuring 43 mm.   8. The inferior vena cava is dilated in size with >50% respiratory  variability, suggesting right atrial pressure of 8 mmHg.      Echo 07/02/21: 1.  Normal left ventricular size, generalized hypokinesis with reduced  ejection fraction of 30 to 35%  2.  Moderate concentric left ventricular hypertrophy  3.  Moderate to severe aortic regurgitation  4.  Enlarged aortic root, 4.27 cm  5.  Mild diastolic flow reversal demonstrated in the descending aorta     Nuclear stress test 09/08/21: Stress Function Comments  Left ventricular function post-stress is abnormal. Global function is mildly reduced. Post-stress ejection fraction is 46 %.  Patient Profile     71 y.o. male 71 y.o. male with a hx of CAD, s/p CABG, polysubstance abuse, prior falls, GERD, HTN, HLD, chronic systolic heart failure with moderate to severe AI, and RBBB who was seen 01/16/2022 for the evaluation of atrial flutter.  Assessment & Plan    Typical atrial flutter  - rate controlled without AV nodal agents - not a candidate for [place] and therefore not a candidate for DCCV or ablation - moderately dilated left atrium on echo - 3 sec pause Monday 0359, no further pauses noted on telemetry - no AV nodal blockers given pause and rates controlled, continue to monitor  Need for chronic anticoagulation - hx of large chest wall hematoma  after a fall in 10/2021 requiring multiple I&Ds and wound vac in Jan - not a good candidate for [facility] given polysubstance abuse, falls, and homelessness - since he is not a good candidate for [facility], this limits his options for rhythm control  Acute on chronic combined heart failure Aortic insufficiency Aortic root 4.27 cm Medication nonadherence - echo this admission with LVEF 35-40%, similar to  echo 07/02/21 with LVEF 30-35% - he did not take medications for about one month prior to admission - diuresing on 40 mg IV lasix, negative 9.6L on admission - renal function improved - Unable to add GDMT due to soft BP - Volume status improved, will convert to PO lasix 40 mg daily  AKI Now resolved   Anemia  Hb 8.9 (9.1) - no signs of active bleeding, not on OAC - per primary   CAD s/p CABG x 4 (LIMA-LAD, SVG-D1, SVG-OM1-OM2, SVG-PDA) - 1998 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 - nonischemic nuclear stress test 09/08/21 with EF estimated at 46% - continue ASA and statin   Chronically dislocated left shoulder - In 10/2021 - wound cultures with MRSA - underwent surgery 2/28   Hx of PE Elevated D-dimer - CTA 02/06/21 with large burden of PE with central pulmonary artery distribution - pulmonology placed on eliquis, but now off - noted this was in the setting of lowe extremity cellulitis and minimal mobility - CTA this admission negative for PE   Thoracic aortic aneurysm - 4.7 cm on CTA yesterday - recommend repeat imaging in 6-12 months     Disposition Alcohol abuse His brother and sister-in-law have refused to help the patient given history of alcohol abuse. He was discharged to a homeless shelter prior to leaving [place] followed last ER visit. - he is currently homeless, not living in a shelter - he states his last drink was 2 weeks ago     For questions or updates, please contact [place] Please consult [email or web address] for contact info under         Signed, Donato Heinz, MD  01/19/2022, 9:05 AM

## 2022-01-19 NOTE — Evaluation (Addendum)
Occupational Therapy Evaluation Patient Details Name: Jeremiah [name] MRN: [id number] DOB: [birth year] Today's Date: 01/19/2022   History of Present Illness Pt is a 71 y/o M presenting to ED on 2/25 wtih L shoulder wound with pain radiating to chest. Found to have L shoulder anteromedial dislocation after fall 3 months ago. S/p removal of reverse shoulder implant with antibiotic spacer on 2/28. PMH includes CAD s/p bypass, hx of PE and DVT on eliquis, HTN and alcohol abuse.   Clinical Impression   Pt independent at baseline, reports using cane and RW intermittently for mobility and is independent with ADLs. Pt unsure of d/c location. Pt currently requiring min-mod A for ADLs , supervision for bed mobility and min guard for transfers with straight cane. Pt reporting nausea and 9/10 pain in LUE at rest. Educated pt on NWB, sling wear schedule, and mobility precautions during session, pt verbalizes understanding, able to perform exercises with minimal difficulty with elbow flexion due to pain. Began education on compensatory strategies for UB dressing, grooming, and showering once cleared to. Pt verbalizes understanding. Pt presenting with impairments listed below, will follow acutely to maximize safety and independence with ADLs/mobility.     Recommendations for follow up therapy are one component of a multi-disciplinary discharge planning process, led by the attending physician.  Recommendations may be updated based on patient status, additional functional criteria and insurance authorization.   Follow Up Recommendations  Follow physician's recommendations for discharge plan and follow up therapies    Assistance Recommended at Discharge Intermittent Supervision/Assistance  Patient can return home with the following A little help with walking and/or transfers;A lot of help with bathing/dressing/bathroom;Assistance with cooking/housework    Functional Status Assessment  Patient has had a recent  decline in their functional status and demonstrates the ability to make significant improvements in function in a reasonable and predictable amount of time.  Equipment Recommendations  None recommended by OT;Other (comment) (TBD depending on pt d/c venue)    Recommendations for Other Services       Precautions / Restrictions Precautions Precautions: Fall Required Braces or Orthoses: Sling Restrictions Weight Bearing Restrictions: Yes LUE Weight Bearing: Non weight bearing Other Position/Activity Restrictions: sling on at all times except for dressing/exercise, AROM of hand/wrist/elbow ok, no shoulder AROM/PROM per MD      Mobility Bed Mobility Overal bed mobility: Needs Assistance Bed Mobility: Sidelying to Sit   Sidelying to sit: Min guard       General bed mobility comments: increased time needed, gets out on R side of bed to avoid breaking precautions on LUE    Transfers Overall transfer level: Needs assistance Equipment used: Straight cane Transfers: Sit to/from Stand Sit to Stand: Min guard                  Balance                                           ADL either performed or assessed with clinical judgement   ADL Overall ADL's : Needs assistance/impaired Eating/Feeding: Set up;Bed level   Grooming: Sitting;Minimal assistance Grooming Details (indicate cue type and reason): min A for bilateral tasks Upper Body Bathing: Minimal assistance;Sitting;Moderate assistance   Lower Body Bathing: Minimal assistance;Sitting/lateral leans;Sit to/from stand   Upper Body Dressing : Maximal assistance;Moderate assistance;Sitting   Lower Body Dressing: Moderate assistance   Toilet Transfer: Minimal assistance;Ambulation;Regular Toilet  Toilet Transfer Details (indicate cue type and reason): simulated in room         Functional mobility during ADLs: Minimal assistance;Cane;Cueing for safety;Cueing for sequencing       Vision   Vision  Assessment?: No apparent visual deficits     Perception     Praxis      Pertinent Vitals/Pain Pain Assessment Pain Assessment: Faces Pain Score: 8  Faces Pain Scale: Hurts whole lot Pain Location: LUE Pain Descriptors / Indicators: Constant, Discomfort, Burning Pain Intervention(s): Limited activity within patient's tolerance, Monitored during session, Premedicated before session, Repositioned     Hand Dominance Right   Extremity/Trunk Assessment Upper Extremity Assessment Upper Extremity Assessment: LUE deficits/detail LUE Deficits / Details: AROM of hand/wrist WFL, decreased elbow flexion due to pain, unable to assess shoulder, immobilized   Lower Extremity Assessment Lower Extremity Assessment: Defer to PT evaluation   Cervical / Trunk Assessment Cervical / Trunk Assessment: Normal   Communication Communication Communication: No difficulties   Cognition Arousal/Alertness: Awake/alert Behavior During Therapy: WFL for tasks assessed/performed Overall Cognitive Status: Within Functional Limits for tasks assessed                                       General Comments  Edcuated pt on NWB and movement precautions, as well as wear schedule for sling. Pt verbalizes understanding. Began education on compensatory strategies for UB dressing, grooming and showering    Exercises Exercises: General Upper Extremity General Exercises - Upper Extremity Elbow Flexion: Left, 5 reps, Seated, AROM Elbow Extension: AROM, Left, 5 reps, Seated Wrist Flexion: AROM, Both, 5 reps, Seated Wrist Extension: AROM, Left, 5 reps, Seated Digit Composite Flexion: AROM, 5 reps, Left, Seated Composite Extension: AROM, Left, 5 reps, Seated   Shoulder Instructions      Home Living Family/patient expects to be discharged to:: Shelter/Homeless                                 Additional Comments: knows of someone in [place], [place] who runs an apartment complex, was planning  to reach out to her/stay there before he came to hospital      Prior Functioning/Environment Prior Level of Function : Independent/Modified Independent             Mobility Comments: reports using RW/cane intermittently ADLs Comments: independent        OT Problem List: Decreased strength;Decreased activity tolerance;Decreased range of motion;Impaired balance (sitting and/or standing);Decreased knowledge of precautions;Impaired UE functional use      OT Treatment/Interventions: Self-care/ADL training;Therapeutic exercise;Energy conservation;DME and/or AE instruction;Therapeutic activities;Balance training;Patient/family education    OT Goals(Current goals can be found in the care plan section) Acute Rehab OT Goals Patient Stated Goal: to get better OT Goal Formulation: With patient Time For Goal Achievement: 02/02/22 Potential to Achieve Goals: Good  OT Frequency: Min 3X/week    Co-evaluation              AM-PAC OT "6 Clicks" Daily Activity     Outcome Measure Help from another person eating meals?: None Help from another person taking care of personal grooming?: A Little Help from another person toileting, which includes using toliet, bedpan, or urinal?: A Little Help from another person bathing (including washing, rinsing, drying)?: A Lot Help from another person to put on and taking off regular upper body  clothing?: A Lot Help from another person to put on and taking off regular lower body clothing?: A Lot 6 Click Score: 16   End of Session Equipment Utilized During Treatment: Gait belt;Other (comment) (sling and SPC) Nurse Communication: Mobility status  Activity Tolerance: Patient tolerated treatment well Patient left: in bed;with call bell/phone within reach;with bed alarm set  OT Visit Diagnosis: Unsteadiness on feet (R26.81);Other abnormalities of gait and mobility (R26.89);Muscle weakness (generalized) (M62.81)                Time: 8367-2550 OT Time  Calculation (min): 39 min Charges:  OT General Charges $OT Visit: 1 Visit OT Evaluation $OT Eval Moderate Complexity: 1 Mod OT Treatments $Self Care/Home Management : 8-22 mins $Therapeutic Activity: 8-22 mins  Lynnda Child, OTD, OTR/L Acute Rehab [phone]) [phone] - [place] 01/19/2022, 12:13 PM

## 2022-01-19 NOTE — Consult Note (Addendum)
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 atrial pressure of 8 mmHg. FINDINGS  Left Ventricle: Left ventricular ejection fraction, by estimation, is 35 to 40%. The left ventricle has moderately decreased function. The left ventricle demonstrates global hypokinesis. Definity contrast agent was given, it did not delineate the left ventricular endocardial borders. The left ventricular internal cavity size was mildly dilated. There is mild left ventricular hypertrophy. Left ventricular diastolic parameters are indeterminate. Right Ventricle: The right ventricular size is normal. Right ventricular systolic function is  normal. There is normal pulmonary artery systolic pressure. The tricuspid regurgitant velocity is 2.50 m/s, and with an assumed right atrial pressure of 8 mmHg,  the estimated right ventricular systolic pressure is 81.4 mmHg. Left Atrium: Left atrial size was moderately dilated. Right Atrium: Right atrial size was normal in size. Pericardium: There is no evidence of pericardial effusion. Mitral Valve: The mitral valve is normal in structure. Mild mitral valve regurgitation. No evidence of mitral valve stenosis. Tricuspid Valve: The tricuspid valve is normal in structure. Tricuspid valve regurgitation is mild to moderate. No evidence of tricuspid stenosis. Aortic Valve: The aortic valve is tricuspid. Aortic valve regurgitation is mild. Aortic regurgitation PHT measures 379 msec. Aortic valve sclerosis is present, with no evidence of aortic valve stenosis. Pulmonic Valve: The pulmonic valve was normal in structure. Pulmonic valve regurgitation is mild. No evidence of pulmonic stenosis. Aorta: Aortic dilatation noted. There is mild dilatation of the ascending aorta, measuring 43 mm. Venous: The inferior vena cava is dilated in size with greater than 50% respiratory variability, suggesting right atrial pressure of 8 mmHg. IAS/Shunts: No atrial level shunt detected by color flow Doppler.  LEFT VENTRICLE PLAX 2D LVIDd:         5.40 cm      Diastology LVIDs:         4.50 cm      LV e' medial:    7.09 cm/s LV PW:         1.20 cm      LV E/e' medial:  16.6 LV IVS:        1.20 cm      LV e' lateral:   9.64 cm/s LVOT diam:     2.30 cm      LV E/e' lateral: 12.2 LV SV:         65 LV SV Index:   33 LVOT Area:     4.15 cm  LV Volumes (MOD) LV vol d, MOD A2C: 221.0 ml LV vol d, MOD A4C: 167.0 ml LV vol s, MOD A2C: 122.0 ml LV vol s, MOD A4C: 111.0 ml LV SV MOD A2C:     99.0 ml LV SV MOD A4C:     167.0 ml LV SV MOD BP:      80.1 ml RIGHT VENTRICLE RV S prime:     6.83 cm/s TAPSE (M-mode): 1.3 cm LEFT ATRIUM             Index         RIGHT ATRIUM           Index LA diam:        4.70 cm 2.38 cm/m   RA Area:     20.40 cm LA Vol (A2C):   82.3 ml 41.59 ml/m  RA Volume:   60.70 ml  30.68 ml/m LA Vol (A4C):   87.6 ml 44.27 ml/m LA Biplane  Vol: 86.4 ml 43.66 ml/m  AORTIC VALVE LVOT Vmax:   79.60 cm/s LVOT Vmean:  47.300 cm/s LVOT VTI:    0.156 m AI PHT:      379 msec  AORTA Ao Root diam: 3.65 cm Ao Asc diam:  4.30 cm MITRAL VALVE                TRICUSPID VALVE MV Area (PHT): 5.56 cm     TR Peak grad:   25.0 mmHg MV Decel Time: 137 msec     TR Vmax:        250.00 cm/s MV E velocity: 118.00 cm/s                             SHUNTS                             Systemic VTI:  0.16 m                             Systemic Diam: 2.30 cm Kirk Ruths MD Electronically signed by Kirk Ruths MD Signature Date/Time: 01/16/2022/11:04:44 AM    Final    [place] EKG SITE RITE  Result Date: 01/18/2022 If Site Rite image not attached, placement could not be confirmed due to current cardiac rhythm.  CT US GUIDED BIOPSY  Result Date: 01/17/2022 INDICATION: 71 year old male with history of left total shoulder arthroplasty complicated by dislocation and left axillary fluid collection. Aspiration of fluid collection is requested as operative plan is formulated. EXAM: Ultrasound-guided left axillary fluid collection aspiration MEDICATIONS: The patient is currently admitted to the hospital and receiving intravenous antibiotics. The antibiotics were administered within an appropriate time frame prior to the initiation of the procedure. ANESTHESIA/SEDATION: Moderate (conscious) sedation was employed during this procedure. A total of Versed 0 mg and Fentanyl 50 mcg was administered intravenously by the radiology nurse. Total intra-service moderate Sedation Time: 0 minutes. The patient's level of consciousness and vital signs were monitored continuously by radiology nursing throughout the procedure under my direct supervision. COMPLICATIONS: None immediate. PROCEDURE:  Informed written consent was obtained from the patient after a thorough discussion of the procedural risks, benefits and alternatives. All questions were addressed. Maximal Sterile Barrier Technique was utilized including caps, mask, sterile gowns, sterile gloves, sterile drape, hand hygiene and skin antiseptic. A timeout was performed prior to the initiation of the procedure. Preprocedure CT evaluation demonstrated a left actually fluid collection similar to comparison study which measured up to approximately 7.3 x 3.6 cm in maximum axial dimensions. CT guidance was used to target ultrasound-guided evaluation. Preprocedure ultrasound demonstrated phlegmonous changes about the left anterior axilla with central anechoic portions which in hole abutted the indwelling left shoulder arthroplasty hardware. The procedure was planned. The left anterior shoulder was prepped and draped in standard fashion. Subdermal Local anesthesia was administered at the planned needle entry site. Under direct ultrasound visualization, a 10 cm, 5 [place] Yueh catheter was directed into the deepest aspect of the left axillary fluid collection. The needle was then removed from the outer cannula and aspiration was performed which yielded approximately 30 mL of cloudy, yellow aspirate. After the Yueh catheter was removed, ultrasound evaluation demonstrated trace residual fluid within the persistent phlegmonous structure. Hemostasis was achieved with brief manual compression and a bandage was applied. The patient tolerated procedure well was transferred back to  the floor in stable condition. IMPRESSION: Technically successful CT and ultrasound guided left anterior axillary fluid collection aspiration yielding approximately 30 mL of cloudy, yellow aspirate. Sample was sent to the lab for culture. Ruthann Cancer, MD [facility] Electronically Signed   By: Ruthann Cancer M.D.   On: 01/17/2022  16:10     Janene Madeira, MSN, NP-C [facility] Pager: [phone]  01/19/2022 9:30 AM

## 2022-01-19 NOTE — Progress Notes (Signed)
Progress Note  Patient: Jeremiah [name] ALP:[id number] DOB: 11-06-1951  DOA: 01/15/2022  DOS: 01/19/2022    Brief hospital course: 71 y.o. male with medical history significant of CAD s/p bypass in 1998,Hx of PE and DVT on Eliquis, HTN, alcohol abuse with hx of withdrawal, opioid dependency previous on Suboxone presented with acute left shoulder and left neck pain.  Patient appeared to be a limited historian.Frustrated that questions are being asked.Has not been on any of his medications.Reportedly patient lost his wife last year and moved out to [place] to live with his brother. However did not get along with his sister-in-law and has recently moved back here to [place] last week and noted to be homeless and living in [place].Noted to have multiple admissions recently in [place] for alcohol withdrawal.  In 10/2021 he was admitted following a fall and was noted to have a large left-sided chest wall hematoma  had multiple I&D and wound VAC in January due to persistent refractory hematoma, found to have chronically dislocated left shoulder arthroplasty in the setting of likely infection. In Care Everywhere 02/06/2021-CTA showed large burden of thromboembolic disease with central pulmonary artery distribution.  Saw Pulm in office with Dr Janett Billow at [facility]- who noted 'it sounds like his pulmonary embolism was associated with some lower extremity cellulitis which he was hospitalized and minimally mobile. He was treated with Eliquis, now off."  11/15/21- was discharged from [facility] a fall with chest wall hematoma/blood loss anemia chronic dislocation of left shoulder-had multiple procedures back treatment- was discharged on oral doxycycline  In the ED, hemodynamically stable afebrile Labs with anemia of 9.1 with no recent prior for  comparison.stable BMP, EKG -atrial flutter and 4-1 conduction with RBBB,BNP of 498.  Troponin reassuring at 15-->17.Left shoulder x-ray  showing anterior dislocation of left reverse total shoulder arthroplasty.Chest x-ray with clear lungs./Cardiomegaly.Further labs with ESR 44 D-dimer 1.1 TSH 1.5 COVID flu negative alcohol less than 10 UDS positive for benzo. Patient was admitted with IV vancomycin after dose of Lasix.  12/26- s/pCTA-no PE, showed cardiomegaly with reflux of contrast into the IVC and hepatic vein suggesting right heart dysfunction, pulmonary artery hypertension, thoracic aortic aneurysm up to 4.7 cm advised semiannual imaging follow-up CT or MRI and referral to cardiothoracic surgery as outpatient.Seen by orthopedic surgeon in consultation had CT of left shoulder-showing postsurgical changes and dislocation, large periprosthetic fluid collection surrounding the proximal humerus and extending throughout the left axillary region up to 7.8 X4.4X 8.8-sequelae of postoperative seroma, hematoma versus particle disease. 12/26- Echo with new onset of reduced EF-cards following. 12/27-CT-guided biopsy of left anterior axillary fluid 30 mL cloudy sent for culture, transfer initiated to [facility] for orthopedic surgery.  Assessment and Plan: Prosthetic left shoulder septic arthritis, history of wound culture growing MRSA Dec 2022: s/p sinus tract excision, washout, abx spacer and wound vac placement 2/28 by Dr. Marlou Sa.  - Follow up wound cultures (NGTD, neg gram stains) - Continue IV vancomycin for now pending ID formal consultation. Anticipate at least 6 weeks IV Tx required.  - Augment oxycodone dose slightly  Homelessness: Complicates use of protracted course of IV antibiotics.  - CSW/CM consulted.   Alcohol abuse: No recent EtOH and no evidence of withdrawal currently.  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  - GDMT limited by hypotension. - Convert IV > PO lasix per cardiology, has diuresed well. Monitor I/O, weights.   Atrial flutter, new diagnosis:  -  Rate  controlled without Tx. Avoiding AV nodal agents due to ~3 second pause noted 01/17/2022.  - Not on anticoagulation due to history of hematoma from fall, ongoing alcohol abuse, homelessness, so not a candidate for rhythm control/DCCV  CAD s/p 4v CABG 1998: Nonischemic stress test Oct 2022.  - Continue ASA, statin, not on BB due to sinus pause as above.   Anemia: Suspect multifactorial due to chronic disease and possible malnutrition.  - Dietitian consulted - Stable postoperatively.   Thoracic aortic aneurysm: 4.cm by CTA - Repeat imaging in 6-12 months  History of PE: Completed eliquis for PE Dx March 2022. CTA negative this admission.   AKI: Resolved.   Obesity: Estimated body mass index is 31.01 kg/m as calculated from the following:   Height as of this encounter: _0  (1.702 m).   Weight as of this encounter: 89.8 kg.  Subjective: Pain in left shoulder is severe, incompletely controlled the medications as ordered.   Objective: Vitals:   01/19/22 0350 01/19/22 0350 01/19/22 0500 01/19/22 0832  BP: (!) 97/59 (!) 97/59  (!) 95/57  Pulse: 66 66  (!) 53  Resp: 17 17    Temp: 97.8 F (36.6 C) 97.8 F (36.6 C)  98.2 F (36.8 C)  TempSrc: Oral Oral  Oral  SpO2: 98%   100%  Weight:   89.8 kg   Height:       Gen: Chronically ill-appearing, pleasant 71 y.o. male in no distress Pulm: Nonlabored breathing room air. Crackles without wheezes. CV: Regular without murmur, rub, or gallop. No JVD, trace pitting symmetric dependent edema. GI: Abdomen soft, non-tender, non-distended, with normoactive bowel sounds.  Ext: Warm, dry. LUE w/brisk cap refill, 2+ radial pulse, SILT, no ROM at shoulder. Left anterior wound vac in place, drain exiting posterolaterally as well. Skin: No other rashes, lesions or ulcers on visualized skin. Neuro: Alert and oriented. No focal neurological deficits. Psych: Judgement and insight appear fair. Mood euthymic & affect congruent. Behavior is appropriate.     Data Personally reviewed: CBC: Recent Labs  Lab 01/15/22 2130 01/16/22 0530 01/18/22 2246 01/19/22 0328  WBC 5.1 3.9* 10.0 9.3  NEUTROABS 3.4  --   --   --   HGB 9.1* 8.9* 9.1* 9.1*  HCT 30.6* 29.8* 31.1* 30.0*  MCV 88.2 87.9 88.9 87.2  PLT 238 252 275 341   Basic Metabolic Panel: Recent Labs  Lab 01/15/22 2130 01/17/22 0231 01/18/22 0431 01/18/22 0822 01/18/22 2246 01/19/22 0544  NA 137 134*  --  135  --  139  K 4.3 4.3  --  4.0  --  4.0  CL 108 107  --  106  --  104  CO2 20* 23  --  24  --  27  GLUCOSE 75 79  --  138*  --  133*  BUN 21 29*  --  27*  --  23  CREATININE 0.95 1.28* 0.96 0.89 0.91 0.90  CALCIUM 8.5* 7.9*  --  7.9*  --  7.8*  MG  --  1.9  --  1.7  --   --    GFR: Estimated Creatinine Clearance: 82.8 mL/min (by C-G formula based on SCr of 0.9 mg/dL). Liver Function Tests: Recent Labs  Lab 01/15/22 2130 01/17/22 0231  AST 22  --   ALT 16  --   ALKPHOS 95  --   BILITOT 0.9  --   PROT 6.1*  --   ALBUMIN 2.8* 2.3*   No results  for input(s): LIPASE, AMYLASE in the last 168 hours. No results for input(s): AMMONIA in the last 168 hours. Coagulation Profile: No results for input(s): INR, PROTIME in the last 168 hours. Cardiac Enzymes: No results for input(s): CKTOTAL, CKMB, CKMBINDEX, TROPONINI in the last 168 hours. BNP (last 3 results) No results for input(s): PROBNP in the last 8760 hours. HbA1C: No results for input(s): HGBA1C in the last 72 hours. CBG: No results for input(s): GLUCAP in the last 168 hours. Lipid Profile: No results for input(s): CHOL, HDL, LDLCALC, TRIG, CHOLHDL, LDLDIRECT in the last 72 hours. Thyroid Function Tests: No results for input(s): TSH, T4TOTAL, FREET4, T3FREE, THYROIDAB in the last 72 hours. Anemia Panel: No results for input(s): VITAMINB12, FOLATE, FERRITIN, TIBC, IRON, RETICCTPCT in the last 72 hours. Urine analysis:    Component Value Date/Time   COLORURINE YELLOW 02/20/2018 [place]  02/20/2018 1205   LABSPEC 1.019 02/20/2018 1205   PHURINE 5.0 02/20/2018 1205   GLUCOSEU NEGATIVE 02/20/2018 1205   HGBUR NEGATIVE 02/20/2018 1205   [place] 02/20/2018 1205   [place] 02/20/2018 1205   PROTEINUR NEGATIVE 02/20/2018 1205   UROBILINOGEN 1.0 07/16/2013 1100   NITRITE NEGATIVE 02/20/2018 1205   LEUKOCYTESUR NEGATIVE 02/20/2018 1205   Recent Results (from the past 240 hour(s))  Resp Panel by RT-PCR (Flu A&B, Covid) Nasopharyngeal Swab     Status: None   Collection Time: 01/15/22 11:38 PM   Specimen: Nasopharyngeal Swab; Nasopharyngeal(NP) swabs in vial transport medium  Result Value Ref Range Status   SARS Coronavirus 2 by RT PCR NEGATIVE NEGATIVE Final    Comment: (NOTE) SARS-CoV-2 target nucleic acids are NOT DETECTED.  The SARS-CoV-2 RNA is generally detectable in upper respiratory specimens during the acute phase of infection. The lowest concentration of SARS-CoV-2 viral copies this assay can detect is 138 copies/mL. A negative result does not preclude SARS-Cov-2 infection and should not be used as the sole basis for treatment or other patient management decisions. A negative result may occur with  improper specimen collection/handling, submission of specimen other than nasopharyngeal swab, presence of viral mutation(s) within the areas targeted by this assay, and inadequate number of viral copies(<138 copies/mL). A negative result must be combined with clinical observations, patient history, and epidemiological information. The expected result is Negative.  Fact Sheet for Patients:  EntrepreneurPulse.com.au  Fact Sheet for Healthcare Providers:  IncredibleEmployment.be  This test is no t yet approved or cleared by the [place] FDA and  has been authorized for detection and/or diagnosis of SARS-CoV-2 by FDA under an Emergency Use Authorization (EUA). This EUA will remain  in effect (meaning this  test can be used) for the duration of the COVID-19 declaration under Section 564(b)(1) of the Act, 21 U.S.C.section 360bbb-3(b)(1), unless the authorization is terminated  or revoked sooner.       Influenza A by PCR NEGATIVE NEGATIVE Final   Influenza B by PCR NEGATIVE NEGATIVE Final    Comment: (NOTE) The Xpert Xpress SARS-CoV-2/FLU/RSV plus assay is intended as an aid in the diagnosis of influenza from Nasopharyngeal swab specimens and should not be used as a sole basis for treatment. Nasal washings and aspirates are unacceptable for Xpert Xpress SARS-CoV-2/FLU/RSV testing.  Fact Sheet for Patients: EntrepreneurPulse.com.au  Fact Sheet for Healthcare Providers: IncredibleEmployment.be  This test is not yet approved or cleared by the [place] FDA and has been authorized for detection and/or diagnosis of SARS-CoV-2 by FDA under an Emergency Use Authorization (EUA). This EUA  will remain in effect (meaning this test can be used) for the duration of the COVID-19 declaration under Section 564(b)(1) of the Act, 21 U.S.C. section 360bbb-3(b)(1), unless the authorization is terminated or revoked.  Performed at [facility], [place] [address], [place], [place] [address]   MRSA Next Gen by PCR, Nasal     Status: Abnormal   Collection Time: 01/16/22 12:47 AM   Specimen: Nasal Mucosa; Nasal Swab  Result Value Ref Range Status   MRSA by PCR Next Gen DETECTED (A) NOT DETECTED Final    Comment: (NOTE) The GeneXpert MRSA Assay (FDA approved for NASAL specimens only), is one component of a comprehensive MRSA colonization surveillance program. It is not intended to diagnose MRSA infection nor to guide or monitor treatment for MRSA infections. Test performance is not FDA approved in patients less than 90 years old. Performed at [facility], [place] [address], [place], [place] [address]   Aerobic/Anaerobic Culture w  Gram Stain (surgical/deep wound)     Status: None (Preliminary result)   Collection Time: 01/17/22  2:52 PM   Specimen: Abscess  Result Value Ref Range Status   Specimen Description   Final    ABSCESS ABSCESS Performed at [place] [address] [place], [place] [address]    Special Requests   Final    LEFT Performed at [facility], [place] [address] [place], [place] [address]    Gram Stain   Final    NO SQUAMOUS EPITHELIAL CELLS SEEN FEW WBC SEEN NO ORGANISMS SEEN    Culture   Final    NO GROWTH 2 DAYS Performed at [facility], [place] [address] [place], [place] [address]    Report Status PENDING  Incomplete  Aerobic/Anaerobic Culture w Gram Stain (surgical/deep wound)     Status: None (Preliminary result)   Collection Time: 01/18/22  6:41 PM   Specimen: PATH Other; Tissue  Result Value Ref Range Status   Specimen Description SHOULDER JOINT FLUID  Final   Special Requests SWAB A  Final   Gram Stain   Final    NO SQUAMOUS EPITHELIAL CELLS SEEN FEW WBC SEEN NO ORGANISMS SEEN    Culture   Final    NO GROWTH < 12 HOURS Performed at [facility], [place] [address] [place], [place] [address]    Report Status PENDING  Incomplete  Aerobic/Anaerobic Culture w Gram Stain (surgical/deep wound)     Status: None (Preliminary result)   Collection Time: 01/18/22  6:46 PM   Specimen: PATH Other; Tissue  Result Value Ref Range Status   Specimen Description SHOULDER LEFT JOINT FLUID  Final   Special Requests SWAB B  Final   Gram Stain   Final    NO SQUAMOUS EPITHELIAL CELLS SEEN FEW WBC SEEN NO ORGANISMS SEEN    Culture   Final    NO GROWTH < 12 HOURS Performed at [facility], [place] [address] [place], [place] [address]    Report Status PENDING  Incomplete  Aerobic/Anaerobic Culture w Gram Stain (surgical/deep wound)     Status: None (Preliminary result)   Collection Time: 01/18/22  6:46 PM   Specimen: PATH Other; Tissue   Result Value Ref Range Status   Specimen Description SHOULDER LEFT JOINT FLUID  Final   Special Requests SWAB C  Final   Gram Stain   Final    NO SQUAMOUS EPITHELIAL CELLS SEEN FEW WBC SEEN NO  ORGANISMS SEEN    Culture   Final    NO GROWTH < 12 HOURS Performed at [facility], [place] [address] [place], [place] [address]    Report Status PENDING  Incomplete     DG Shoulder Left Port  Result Date: 01/18/2022 CLINICAL DATA:  Left shoulder arthroplasty removal EXAM: LEFT SHOULDER COMPARISON:  01/15/2022 FINDINGS: Frontal view of the left shoulder demonstrates interval removal of the left shoulder arthroplasty, with methylmethacrylate placed within the left humerus. Surgical drain identified. No acute fractures. IMPRESSION: 1. Removal of left shoulder arthroplasty with methylmethacrylate placement as above. Electronically Signed   By: Randa Ngo M.D.   On: 01/18/2022 22:27   [place] EKG SITE RITE  Result Date: 01/18/2022 If [facility] image not attached, placement could not be confirmed due to current cardiac rhythm.  CT US GUIDED BIOPSY  Result Date: 01/17/2022 INDICATION: 71 year old male with history of left total shoulder arthroplasty complicated by dislocation and left axillary fluid collection. Aspiration of fluid collection is requested as operative plan is formulated. EXAM: Ultrasound-guided left axillary fluid collection aspiration MEDICATIONS: The patient is currently admitted to the hospital and receiving intravenous antibiotics. The antibiotics were administered within an appropriate time frame prior to the initiation of the procedure. ANESTHESIA/SEDATION: Moderate (conscious) sedation was employed during this procedure. A total of Versed 0 mg and Fentanyl 50 mcg was administered intravenously by the radiology nurse. Total intra-service moderate Sedation Time: 0 minutes. The patient's level of consciousness and vital signs were monitored continuously by radiology nursing  throughout the procedure under my direct supervision. COMPLICATIONS: None immediate. PROCEDURE: Informed written consent was obtained from the patient after a thorough discussion of the procedural risks, benefits and alternatives. All questions were addressed. Maximal Sterile Barrier Technique was utilized including caps, mask, sterile gowns, sterile gloves, sterile drape, hand hygiene and skin antiseptic. A timeout was performed prior to the initiation of the procedure. Preprocedure CT evaluation demonstrated a left actually fluid collection similar to comparison study which measured up to approximately 7.3 x 3.6 cm in maximum axial dimensions. CT guidance was used to target ultrasound-guided evaluation. Preprocedure ultrasound demonstrated phlegmonous changes about the left anterior axilla with central anechoic portions which in hole abutted the indwelling left shoulder arthroplasty hardware. The procedure was planned. The left anterior shoulder was prepped and draped in standard fashion. Subdermal Local anesthesia was administered at the planned needle entry site. Under direct ultrasound visualization, a 10 cm, 5 [place] Yueh catheter was directed into the deepest aspect of the left axillary fluid collection. The needle was then removed from the outer cannula and aspiration was performed which yielded approximately 30 mL of cloudy, yellow aspirate. After the Yueh catheter was removed, ultrasound evaluation demonstrated trace residual fluid within the persistent phlegmonous structure. Hemostasis was achieved with brief manual compression and a bandage was applied. The patient tolerated procedure well was transferred back to the floor in stable condition. IMPRESSION: Technically successful CT and ultrasound guided left anterior axillary fluid collection aspiration yielding approximately 30 mL of cloudy, yellow aspirate. Sample was sent to the lab for culture. Ruthann Cancer, MD Vascular and Interventional Radiology  [facility] Radiology Electronically Signed   By: Ruthann Cancer M.D.   On: 01/17/2022 16:10    Disposition: Status is: Inpatient Remains inpatient appropriate because: Culture results and need for IV abx.  Planned Discharge Destination:  TBD  Patrecia Pour, MD 01/19/2022 2:03 PM Page by [email or web address]

## 2022-01-19 NOTE — Evaluation (Signed)
Physical Therapy Evaluation ?Patient Details ?Name: Jeremiah [name] ?MRN: [id number] ?DOB: 1951-05-03 ?Today's Date: 01/19/2022 ? ?History of Present Illness ? Pt is a [age] y/[age] M s/p removal reverse shoulder implant with antibiotic spacer, placement of negative pressure dressing, excision of sinus tract due to fall resulting in dislocation of L shoulder reverse replacement. PMH includes CAD, CABG, polysubstance abuse, prior falls, GERD, HTN, HLD, chrinic systolic heart failure, atrial flutter, and RBBB. ?  ?Clinical Impression ? Received pt semi-reclined in bed with sling on LUE. Pt performed bed mobility with supervision and transfers/ambulation with SPC and min guard. Pt reports being unsure of where he will go upon discharge as he recently moved back to town and was looking for a place to stay when fall occurred. Pt requested to remain in recliner at end of session - all needs met. Notified case manager of pt's current situation. Acute PT to cont to follow.  ?   ? ?Recommendations for follow up therapy are one component of a multi-disciplinary discharge planning process, led by the attending physician.  Recommendations may be updated based on patient status, additional functional criteria and insurance authorization. ? ?Follow Up Recommendations Home health PT ? ?  ?Assistance Recommended at Discharge Intermittent Supervision/Assistance  ?Patient can return home with the following ? A little help with walking and/or transfers;A little help with bathing/dressing/bathroom;Assist for transportation;Help with stairs or ramp for entrance ? ?  ?Equipment Recommendations Kasandra Knudsen  ?Recommendations for Other Services ?    ?  ?Functional Status Assessment Patient has had a recent decline in their functional status and demonstrates the ability to make significant improvements in function in a reasonable and predictable amount of time.  ? ?  ?Precautions / Restrictions Precautions ?Precautions: Fall ?Required Braces or Orthoses:  Sling ?Restrictions ?Weight Bearing Restrictions: Yes ?LUE Weight Bearing: Non weight bearing ?Other Position/Activity Restrictions: sling on at all times except for dressing/exercise, AROM of hand/wrist/elbow ok, no shoulder AROM/PROM per MD  ? ?  ? ?Mobility ? Bed Mobility ?Overal bed mobility: Needs Assistance ?Bed Mobility: Rolling, Supine to Sit ?Rolling: Supervision ?  ?Supine to sit: Supervision ?  ?  ?General bed mobility comments: HOB elevated and use of bedrails with RUE ?Patient Response: Cooperative ? ?Transfers ?Overall transfer level: Needs assistance ?Equipment used: Straight cane ?Transfers: Sit to/from Stand ?Sit to Stand: Min guard ?  ?  ?  ?  ?  ?General transfer comment: stood from EOB without AD and min guard and then grabbed onto cane ?  ? ?Ambulation/Gait ?Ambulation/Gait assistance: Min guard ?Gait Distance (Feet): 20 Feet ?Assistive device: Straight cane ?Gait Pattern/deviations: Step-through pattern, Decreased step length - right, Decreased step length - left, Decreased stride length, Narrow base of support ?Gait velocity: decreased ?Gait velocity interpretation: <1.31 ft/sec, indicative of household ambulator ?  ?General Gait Details: generalized unsteadiness upon standing, specifially when turning ? ?Stairs ?  ?  ?  ?  ?  ? ?Wheelchair Mobility ?  ? ?Modified Rankin (Stroke Patients Only) ?  ? ?  ? ?Balance Overall balance assessment: Needs assistance ?Sitting-balance support: Single extremity supported, Feet supported ?Sitting balance-Leahy Scale: Fair ?  ?  ?Standing balance support: Single extremity supported [facility]) ?Standing balance-Leahy Scale: Fair ?Standing balance comment: pt required min guard for dynamic standing balance ?  ?  ?  ?  ?  ?  ?  ?  ?  ?  ?  ?   ? ? ? ?Pertinent Vitals/Pain Pain Assessment ?Pain Assessment: 0-10 ?Pain Score:  7  ?Pain Location: LUE ?Pain Descriptors / Indicators: Constant, Discomfort, Burning, Guarding, Nagging, Sore ?Pain Intervention(s): Limited  activity within patient's tolerance, Monitored during session, Premedicated before session, Repositioned  ? ? ?Home Living Family/patient expects to be discharged to:: Shelter/Homeless ?  ?  ?  ?  ?  ?  ?  ?  ?  ?Additional Comments: knows of someone in [place], [place] who runs an apartment complex, was planning to reach out to her/stay there before he came to hospital  ?  ?Prior Function Prior Level of Function : Independent/Modified Independent ?  ?  ?  ?  ?  ?  ?Mobility Comments: reports using RW/cane intermittently ?ADLs Comments: independent ?  ? ? ?Hand Dominance  ? Dominant Hand: Right ? ?  ?Extremity/Trunk Assessment  ? Upper Extremity Assessment ?Upper Extremity Assessment: Defer to OT evaluation ?LUE Deficits / Details: AROM of hand/wrist WFL, decreased elbow flexion due to pain, unable to assess shoulder, immobilized ?  ? ?Lower Extremity Assessment ?Lower Extremity Assessment: Generalized weakness ?  ? ?Cervical / Trunk Assessment ?Cervical / Trunk Assessment: Normal  ?Communication  ? Communication: No difficulties  ?Cognition Arousal/Alertness: Awake/alert ?Behavior During Therapy: [facility] for tasks assessed/performed ?Overall Cognitive Status: Within Functional Limits for tasks assessed ?  ?  ?  ?  ?  ?  ?  ?  ?  ?  ?  ?  ?  ?  ?  ?  ?  ?  ?  ? ?  ?General Comments General comments (skin integrity, edema, etc.): Edcuated pt on NWB and movement precautions, as well as wear schedule for sling. Pt verbalizes understanding. Began education on compensatory strategies for UB dressing, grooming and showering ? ?  ?Exercises    ? ?Assessment/Plan  ?  ?PT Assessment Patient needs continued PT services  ?PT Problem List Decreased strength;Decreased range of motion;Decreased activity tolerance;Decreased balance;Decreased mobility;Decreased coordination;Pain ? ?   ?  ?PT Treatment Interventions DME instruction;Gait training;Stair training;Functional mobility training;Therapeutic activities;Therapeutic exercise;Balance  training;Neuromuscular re-education;Cognitive remediation;Patient/family education;Modalities   ? ?PT Goals (Current goals can be found in the Care Plan section)  ?Acute Rehab PT Goals ?Patient Stated Goal: to figure out where he is going when he leaves the hospital ?PT Goal Formulation: With patient ?Time For Goal Achievement: 02/02/22 ?Potential to Achieve Goals: Fair ? ?  ?Frequency Min 5X/week ?  ? ? ?Co-evaluation   ?  ?  ?  ?  ? ? ?  ?AM-PAC PT "6 Clicks" Mobility  ?Outcome Measure Help needed turning from your back to your side while in a flat bed without using bedrails?: None ?Help needed moving from lying on your back to sitting on the side of a flat bed without using bedrails?: A Little ?Help needed moving to and from a bed to a chair (including a wheelchair)?: A Little ?Help needed standing up from a chair using your arms (e.g., wheelchair or bedside chair)?: A Little ?Help needed to walk in hospital room?: A Little ?Help needed climbing 3-5 steps with a railing? : A Little ?6 Click Score: 19 ? ?  ?End of Session Equipment Utilized During Treatment: Gait belt ?Activity Tolerance: Patient tolerated treatment well ?Patient left: in chair;with call bell/phone within reach;with chair alarm set ?Nurse Communication: Mobility status ?PT Visit Diagnosis: Unsteadiness on feet (R26.81);Muscle weakness (generalized) (M62.81);Pain ?Pain - Right/Left: Left ?Pain - part of body: Arm;Shoulder ?  ? ?Time: 1497-0263 ?PT Time Calculation (min) (ACUTE ONLY): 29 min ? ? ?Charges:   PT  Evaluation ?$PT Eval Low Complexity: 1 Low ?PT Treatments ?$Gait Training: 8-22 mins ?  ?   ? ? ?Becky Sax PT, DPT  ?Blenda Nicely ?01/19/2022, 12:29 PM ? ?

## 2022-01-19 NOTE — Social Work (Addendum)
CSW notes patient is homeless and unfortunately at this time there is significant barriers to securing housing in the medical setting. CSW will provide patient with information on resources to follow-up with in the community once they discharge. CSW notes patient arrived to the hospital with no current residence and at this time when they are medically cleared they are appropriate to discharge back to their current living situation. ? ?Emeterio Reeve, LCSW ?Clinical Social Worker ? ?

## 2022-01-19 NOTE — Progress Notes (Signed)
Pharmacy Antibiotic Note ? ?Jeremiah [name] is a 71 y.o. male admitted on 01/15/2022 with medical history significant of CAD s/p bypass in 1998,Hx of PE and DVT on Eliquis, HTN, alcohol abuse with hx of withdrawal, opioid dependency previous on Suboxone, presents with acute onset left shoulder pain and left-sided neck pain. 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 On 2/28 ortho removed infected left shoulder prosthetic, reverse shoulder replacement, and abx spacer placement.  Pharmacy has been consulted for vancomycin dosing. ? ?3/1 Vancomycin 750mg  Q 12 hr ?Scr used: 0.9 mg/dL ?Weight: 89.8 kg ?Vd coeff: 0.5 L/kg ?Est AUC: 518 ? ?Plan: ?Change Vancomycin to 750mg  q12 hr ?Monitor cultures, clinical status, renal function, vancomycin level ?Narrow abx as able and f/u duration  ? ? ? ?Height: 5\' 7"  (170.2 cm) ?Weight: 89.8 kg (197 lb 15.6 oz) ?IBW/kg (Calculated) : 66.1 ? ?Temp (24hrs), Avg:98.1 ?F (36.7 ?C), Min:97.5 ?F (36.4 ?C), Max:98.7 ?F (37.1 ?C) ? ?Recent Labs  ?Lab 01/15/22 ?2130 01/16/22 ?0530 01/17/22 ?0231 01/18/22 ?0431 01/18/22 ?5456 01/18/22 ?2246 01/19/22 ?0328 01/19/22 ?2563  ?WBC 5.1 3.9*  --   --   --  10.0 9.3  --   ?CREATININE 0.95  --  1.28* 0.96 0.89 0.91  --  0.90  ? ?  ?Estimated Creatinine Clearance: 82.8 mL/min (by C-G formula based on SCr of 0.9 mg/dL).   ? ?Allergies  ?Allergen Reactions  ? Morphine Hives, Swelling and Rash  ?  Has taken with no reaction since original reaction  ? ? ?Antimicrobials this admission: ?Vancomycin 2/26 >> ?PTA Doxy 2/14 >>2/25 (not taking?) ? ? ?Dose adjustments this admission: ?3/1 vancomycin 1250mg  Q12hr > 750mg  Q12 hr  ? ?Microbiology results: ?2/27 abscess ngtd ?2/28 OR joint fluid: ngtd  ?2/26 MRSA + ? ?Thank you for allowing pharmacy to be a part of this patient?s care. ? ? ?Benetta Spar, PharmD, BCPS, BCCP ?Clinical Pharmacist ? ?Please check AMION for all [place] phone numbers ?After 10:00 PM, call [place] [phone] ? ?

## 2022-01-19 NOTE — Op Note (Signed)
NAMETYQUON, NEAR. MEDICAL RECORD NO: [id number] ACCOUNT NO: [id number] DATE OF BIRTH: May 10, 1951 FACILITY: [facility] LOCATION: [id number] PHYSICIAN: Yetta Barre. Marlou Sa, MD  Operative Report   DATE OF PROCEDURE: 01/18/2022  PREOPERATIVE DIAGNOSIS:  Infected left prosthetic reverse shoulder replacement.  POSTOPERATIVE DIAGNOSIS:  Infected left prosthetic reverse shoulder replacement.  PROCEDURE:  Left shoulder excision of sinus tract, removal of reverse shoulder replacement, extensive debridement using curettes, cautery and #11 blade and #15 blade scalpel along with placement of antibiotic spacer, placement of wound VAC, Prevena type.  SURGEON:  Meredith Pel, MD  ASSISTANT:  Annie Main.  INDICATIONS:  The patient is a 71 year old patient with prosthetic left joint infection as well as dislocation, presents now for operative management after explanation of risks and benefits.  DESCRIPTION OF PROCEDURE:  The patient was brought to the operating room where general anesthetic was induced.  Preoperative IV antibiotics were held until cultures were obtained.  The patient was placed in the beach chair position with the head in  neutral position.  Left shoulder, arm and hand prescrubbed with alcohol and Betadine, allowed to air dry, prepped with ChloraPrep solution and draped in a sterile manner.  Charlie Pitter was used to seal the operative field and seal most of the area except where  the incision was going to be in order to visualize the sinus tract and surrounding tissue.  After calling time-out, an incision was made through the prior incision.  The region where they did some type of hematoma evacuation was at the inferior aspect of  the incision curving towards the axilla.  The incision was made.  The patient was noted to have AC separation, likely from one of his multiple falls.  Deltoid was mobilized off of the prosthesis.  A large cavity of fluid was encountered and that was  sent for culture x3.   The soft tissue dissection was performed around the prosthesis on the humeral side.  The Arthrex cup was removed.  Then, using a combination of osteotomes and extraction devices, the implant was removed once that bony interface  proximally was disrupted.  Some bone loss was encountered around the calcar.  At this time, attention was directed towards the glenosphere and the baseplate.  These 2 were also removed.  Significant infection was present both in the proximal humerus as  well as within the glenoid by feel.  Thorough irrigation and curettage was then performed with 6 liters of irrigating solution.  Some of the pectoralis tendon was released to allow for an attempt at reduction of the cement spacer.  Next, the brachial  plexus was visualized.  The neurovascular structures were protected.  Some soft tissue dissection was performed around soft tissue from the glenoid in order to create a soft tissue bridge to prevent anterior dislocation.  At this time following thorough  irrigation and excisional debridement, the cement spacer was placed proximally with a cement, which contained both gentamicin and vancomycin.  Cement hardening was allowed to occur.  The head was placed in some retroversion to allow for less of a chance  at dislocation.  Three liters of irrigating solution again placed.  Vancomycin also placed into the canal prior to placement of the cement spacer.  Next, cement was allowed to harden.  With the patient's arm across his chest in the sling position, the  humeral head was reduced.  This soft tissue remnant bursal tissue was sutured back through what remained of the tuberosity.  That created a soft tissue brace  to prevent anterior dislocation at least for the first several weeks.  Thorough irrigation was  performed.  Vancomycin powder placed.  The deltopectoral split was closed using #1 Vicryl suture loosely. Sinus tract was excised.  Then, the skin was closed using 0 Vicryl loosely and then  2-0 nylon with a Prevena incisional VAC applied.  Next and prior  to that, we did put a Hemovac drain exiting posteriorly with the drain in the anterior portion of the implant removal region.  This will be pulled out tomorrow.  Infectious disease will consult for long-term antibiotics.  Difficult to say if  reimplantation is an option for this patient based on his medical condition and overall poor nutritional status.  Luke's assistance was required at all times for retraction, opening, closing, mobilization of tissue.  His assistance was a medical necessity.     PAA D: 01/18/2022 9:19:59 pm T: 01/18/2022 10:38:00 pm  JOB: [id number]/ [id number]

## 2022-01-20 ENCOUNTER — Other Ambulatory Visit (HOSPITAL_COMMUNITY): Payer: Self-pay

## 2022-01-20 ENCOUNTER — Encounter (HOSPITAL_COMMUNITY): Payer: Self-pay | Admitting: Orthopedic Surgery

## 2022-01-20 DIAGNOSIS — I5043 Acute on chronic combined systolic (congestive) and diastolic (congestive) heart failure: Secondary | ICD-10-CM | POA: Diagnosis not present

## 2022-01-20 DIAGNOSIS — E43 Unspecified severe protein-calorie malnutrition: Secondary | ICD-10-CM | POA: Diagnosis present

## 2022-01-20 DIAGNOSIS — F1010 Alcohol abuse, uncomplicated: Secondary | ICD-10-CM | POA: Diagnosis not present

## 2022-01-20 DIAGNOSIS — I483 Typical atrial flutter: Secondary | ICD-10-CM | POA: Diagnosis not present

## 2022-01-20 DIAGNOSIS — S41002A Unspecified open wound of left shoulder, initial encounter: Secondary | ICD-10-CM | POA: Diagnosis not present

## 2022-01-20 LAB — CBC
HCT: 28.4 % — ABNORMAL LOW (ref 39.0–52.0)
Hemoglobin: 8.6 g/dL — ABNORMAL LOW (ref 13.0–17.0)
MCH: 26.6 pg (ref 26.0–34.0)
MCHC: 30.3 g/dL (ref 30.0–36.0)
MCV: 87.9 fL (ref 80.0–100.0)
Platelets: 239 10*3/uL (ref 150–400)
RBC: 3.23 MIL/uL — ABNORMAL LOW (ref 4.22–5.81)
RDW: 17.5 % — ABNORMAL HIGH (ref 11.5–15.5)
WBC: 6.2 10*3/uL (ref 4.0–10.5)
nRBC: 0 % (ref 0.0–0.2)

## 2022-01-20 LAB — BASIC METABOLIC PANEL
Anion gap: 7 (ref 5–15)
BUN: 24 mg/dL — ABNORMAL HIGH (ref 8–23)
CO2: 27 mmol/L (ref 22–32)
Calcium: 8 mg/dL — ABNORMAL LOW (ref 8.9–10.3)
Chloride: 104 mmol/L (ref 98–111)
Creatinine, Ser: 0.89 mg/dL (ref 0.61–1.24)
GFR, Estimated: >60 mL/min (ref >60)
Glucose, Bld: 117 mg/dL — ABNORMAL HIGH (ref 70–99)
Potassium: 3.9 mmol/L (ref 3.5–5.1)
Sodium: 138 mmol/L (ref 135–145)

## 2022-01-20 LAB — VITAMIN D 25 HYDROXY (VIT D DEFICIENCY, FRACTURES): Vit D, 25-Hydroxy: 13.65 ng/mL — ABNORMAL LOW (ref 30–100)

## 2022-01-20 LAB — HEPATITIS C ANTIBODY: HCV Ab: NONREACTIVE

## 2022-01-20 LAB — HEPATITIS B SURFACE ANTIGEN: Hepatitis B Surface Ag: NONREACTIVE

## 2022-01-20 LAB — C-REACTIVE PROTEIN: CRP: 11 mg/dL — ABNORMAL HIGH (ref <1.0)

## 2022-01-20 LAB — MAGNESIUM: Magnesium: 1.5 mg/dL — ABNORMAL LOW (ref 1.7–2.4)

## 2022-01-20 MED ORDER — ADULT MULTIVITAMIN W/MINERALS CH
1.0000 | ORAL_TABLET | Freq: Every day | ORAL | Status: DC
  Administered 2022-01-20 – 2022-02-22 (×34): 1 via ORAL
  Filled 2022-01-20 (×34): qty 1

## 2022-01-20 MED ORDER — JUVEN PO PACK
1.0000 | PACK | Freq: Two times a day (BID) | ORAL | Status: DC
  Administered 2022-01-20 – 2022-02-08 (×35): 1 via ORAL
  Filled 2022-01-20 (×35): qty 1

## 2022-01-20 MED ORDER — MAGNESIUM SULFATE 2 GM/50ML IV SOLN
2.0000 g | Freq: Once | INTRAVENOUS | Status: AC
Start: 2022-01-20 — End: 2022-01-20
  Administered 2022-01-20: 2 g via INTRAVENOUS
  Filled 2022-01-20: qty 50

## 2022-01-20 MED ORDER — ENSURE MAX PROTEIN PO LIQD
11.0000 [oz_av] | Freq: Every day | ORAL | Status: DC
  Administered 2022-01-20 – 2022-01-29 (×9): 11 [oz_av] via ORAL
  Filled 2022-01-20 (×12): qty 330

## 2022-01-20 MED ORDER — ENSURE ENLIVE PO LIQD
237.0000 mL | Freq: Two times a day (BID) | ORAL | Status: DC
  Administered 2022-01-20 – 2022-01-31 (×19): 237 mL via ORAL

## 2022-01-20 MED ORDER — FUROSEMIDE 20 MG PO TABS
20.0000 mg | ORAL_TABLET | Freq: Every day | ORAL | Status: DC
  Administered 2022-01-20 – 2022-02-21 (×33): 20 mg via ORAL
  Filled 2022-01-20 (×33): qty 1

## 2022-01-20 NOTE — Progress Notes (Signed)
?   01/20/22 1110  ?Clinical Encounter Type  ?Visited With Patient  ?Visit Type Initial;Spiritual support  ?Referral From Nurse  ?Consult/Referral To Chaplain  ? ?Chaplain Jorene Guest responded to the consult request. The patient was sitting in his chair. Chaplain engaged in active listening as the patient spoke of his decease wife and his faith in God. Patient said he hopes to be reunited with his wife someday. Patient named people he wanted to be remembered in prayer. Jeremiah [name] prayed per his request. This note was prepared by Jeanine Luz, M.Div..  For questions please contact by phone [phone].   ?

## 2022-01-20 NOTE — Progress Notes (Addendum)
Pt stable ?Left shoulder located with cement spacer ?Org is likely [facility] - on vanc ?Dc planning in progress ?Thx for excellent care from [facility], id ,and cardiology ?

## 2022-01-20 NOTE — Progress Notes (Addendum)
Initial Nutrition Assessment ? ?DOCUMENTATION CODES:  ?Severe malnutrition in context of social or environmental circumstances ? ?INTERVENTION:  ?Continue current diet as ordered, encourage PO intake ?Ensure Enlive po BID, each supplement provides 350 kcal and 20 grams of protein. ?Ensure Max po 1x/d, each supplement provides 150 kcal and 30 grams of protein.  ?MVI with minerals daily ?Assess vitamin labs to screen for deficiencies (Vitamin A, B1, B6, C, D, E, K, Copper, and zinc) ? ?NUTRITION DIAGNOSIS:  ?Severe Malnutrition related to social / environmental circumstances (etoh and drug use, hx of gastric bypass) as evidenced by severe fat depletion, severe muscle depletion. ? ?GOAL:  ?Patient will meet greater than or equal to 90% of their needs ? ?MONITOR:  ?PO intake, Labs, Supplement acceptance, Weight trends ? ?REASON FOR ASSESSMENT:  ?Consult ?Assessment of nutrition requirement/status, Poor PO, Wound healing ? ?ASSESSMENT:  ?70 y.o. male with history of CAD s/p CABGx4, HTN, HLD, CHF (EF 35-40%), GERD, gout, alcohol abuse, opioid dependency, hx of gastric bypass surgery, and CKD2, presented to ED with worsening left shoulder pain and left-sided neck pain from an injury which occurred ~a year ago. Noted pt is currently homeless and has been for about 6 months. ? ?Pt has a complicated and extensive medical hx, difficult to piece together due to traveling between so many health systems and lack of continuity of care. Reviewed records in care everywhere and confirmed events with patient.  ? ?Pt resting in bedside chair at the time of assessment. Finishing up breakfast. Pt's wife passed away ~1 year ago and shortly after this, pt injured his shoulder (dislocation) and has been having problems with it and recovering since that time. Pt reports his wife being "the love of his life" and things "falling apart" after her passing. His wife seemed to be heavily involved in assisting with medication management and medical  care coordination ? ?Pt reports that he moved to [place] in 12/2020 after his wife passed to live with his brother and sister in law. There was a falling out in the family, and after 6 months, pt moved out of their house and has had transient housing for ~6 months. Mostly staying in hotels. Discussed nutrition during this time, pt denies food insecurity or not having access to enough food. Prior to this current hospitalization, pt made his way back to [place] and hopes to return to [place], where he lived prior to moving to [place]. Pt reports having a large church family and group of friends who can help him get back on his feet and care for him.  ? ?Noted several surgeries and infections over the past year and admission to hospitals in both [place] and [place] to try and repair. Also note several hospitalizations in the last year were related to EtOH and drug abuse.  ? ?Upon reviewing chart, noted a hx of gastric bypass surgery. Pt confirms that he did have this procedure back in the 1980. Discussed a gastric sleeve versus gastric bypass, pt reports he had gastric bypass surgery. No records available from this time to confirm. Pt states he was taking vitamins in the past - but was unable to remember which ones he had previously been on. Stated his wife helped him keep track of his medicine. Pt reports prior to surgery he weighed 475 lbs and was now typically ~180-190 lbs.  ? ?Significant fat wasting detected on exam and muscle wasting, particularly to pt's face. Some edema present to BLE. ? ?Had a long discussion with patient  about his need for adequate nutrition to preserve lean body mass and meet his increased needs for wound healing with wound vac in place. Also discussed drawing vitamin labs to determine what pt is deficient in and replace to optimize his capacity for healing and recovery. Pt agreeable to nutrition supplements and to vitamin labs/regimen. Discussed with ID.  ? ?2/28 - Op, left RSA implant  removal with I&D of shoulder joint, placement of antibiotic spacer and wound vac. Hemovac drain placed ?  ?Average Meal Intake: ?2/26-3/1: 91% intake x 7 recorded meals ? ?Nutritionally Relevant Medications: ?Scheduled Meds: ? docusate sodium  100 mg Oral BID  ? furosemide  20 mg Oral Daily  ? rosuvastatin  40 mg Oral Daily  ? vitamin B-12  1,000 mcg Oral Daily  ? ?Continuous Infusions: ? vancomycin 750 mg (01/19/22 2305)  ? ?PRN Meds: metoCLOPramide  ? ?Labs Reviewed: ?BUN 24 ?Mg 1.5 ? ?Vitamin Labs ?Vitamin A (pending) ?Vitamin B1 (pending) ?Vitamin B6 (pending) ?Vitamin B12 176 (low, 2/26)  ?Vitamin C (pending) ?Vitamin D (pending) ?Vitamin E (pending) ?Vitamin K (pending) ?Folate 20.8 (normal, 2/26) ?Copper (pending) ?Zinc (pending) ? ?NUTRITION - FOCUSED PHYSICAL EXAM: ?Flowsheet Row Most Recent Value  ?Orbital Region Severe depletion  ?Upper Arm Region Severe depletion  ?Thoracic and Lumbar Region Severe depletion  ?Buccal Region Severe depletion  ?Temple Region Severe depletion  ?Clavicle Bone Region Mild depletion  ?Clavicle and Acromion Bone Region Mild depletion  ?Scapular Bone Region Mild depletion  ?Dorsal Hand Severe depletion  ?Patellar Region Moderate depletion  ?Anterior Thigh Region Moderate depletion  ?Posterior Calf Region Severe depletion  ?Edema (RD Assessment) Mild  ?Hair Reviewed  ?Eyes Reviewed  ?Mouth Reviewed  ?Skin Reviewed  [scattered brusing]  ?Nails Reviewed  ? ?Diet Order:   ?Diet Order   ? ?       ?  Diet regular Room service appropriate? Yes; Fluid consistency: Thin  Diet effective now       ?  ? ?  ?  ? ?  ? ? ?EDUCATION NEEDS:  ?Education needs have been addressed ? ?Skin:  Skin Assessment: Skin Integrity Issues: ?Skin Integrity Issues:: Wound VAC, Other (Comment) ?Wound Vac: left shoulder ?Other: abrasions to the head, legs, and shoulders; ecchymosis to the bilateral arms and legs ? ?Last BM:  3/2 - type 4 ? ?Height:  ?Ht Readings from Last 1 Encounters:  ?01/18/22 5\' 7"  (1.702 m)   ? ? ?Weight:  ?Wt Readings from Last 1 Encounters:  ?01/19/22 89.8 kg  ? ? ?Ideal Body Weight:  67.3 kg ? ?BMI:  Body mass index is 31.01 kg/m?. ? ?Estimated Nutritional Needs:  ?Kcal:  2200-2500 kcal/d ?Protein:  120-135g/d ?Fluid:  2.3-2.5 L/d ? ? ?Ranell Patrick, RD, LDN ?Clinical Dietitian ?RD pager # available in [place]  ?After hours/weekend pager # available in [place] ?
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
## 2022-01-20 NOTE — Progress Notes (Signed)
Evaluated entire Rt. Arm. No veins appropriate for PIV. Left arm is restricted due to recent shoulder surgery. 2nd RN will also assess. ?

## 2022-01-20 NOTE — Progress Notes (Signed)
Mobility Specialist Progress Note: ? ? 01/20/22 1630  ?Mobility  ?Activity Ambulated with assistance in room  ?Level of Assistance Standby assist, set-up cues, supervision of patient - no hands on  ?Assistive Device [place]  ?LUE Weight Bearing NWB  ?Distance Ambulated (ft) 30 ft  ?Activity Response Tolerated well  ?$Mobility charge 1 Mobility  ? ?Pt received in bed. No complaints of pain. Left in bed with call bell in reach and all needs met.  ? ?Geral Tuch ?Mobility Specialist ?Primary Phone [phone] ? ?

## 2022-01-20 NOTE — TOC Progression Note (Addendum)
Transition of Care (TOC) - Progression Note  ? ? ?Patient Details  ?Name: Jeremiah [name] ?MRN: [id number] ?Date of Birth: 12/05/1950 ? ?Transition of Care (TOC) CM/SW Contact  ?Marilu Favre, RN ?Phone Number: ?01/20/2022, 11:48 AM ? ?Clinical Narrative:    ? ?Patient discussed in progression with Dr Hulen Skains.  ? ?Since then [facility] team received message Lisette Abu reaching out to the [employer] at [facility] to see if any housing available.  ? ?NCM spoke to patient , he cannot return to address listed on face sheet, has no where to go at discharge. Has no friend/family he can stay with. ? ?NCM spoke to Opal Sidles at [facility] patient scheduled for appointment for January 31, 2022 at 1:30 pm. Information added to AVS.  ? ?Per Delton Coombes looking at funding for housing for patient.  ? ?TOC supervisor following also  ? ? ?Called [facility] Medicare transportation) , was told patient's transportation benefit expired Jan 1 , 2019 . I called [employer] directly and confirmed . Only transportation benefit he has is ambulance which is $240.00 one way .  ? ?Once discharge address known [facility] supervisor will review for possible cab to infusion appointments.  ? ?Expected Discharge Plan: [place] ?Barriers to Discharge: Continued Medical Work up ? ?Expected Discharge Plan and Services ?Expected Discharge Plan: [place] ?  ?Discharge Planning Services: CM Consult ?  ?Living arrangements for the past 2 months: [place] ?                ?  ?  ?  ?  ?  ?  ?  ?  ?  ?  ? ? ?Social Determinants of Health (SDOH) Interventions ?  ? ?Readmission Risk Interventions ?No flowsheet data found. ? ?
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
## 2022-01-20 NOTE — Progress Notes (Signed)
Progress Note  Patient: Jeremiah [name] PPI:[id number] DOB: 01-18-1951  DOA: 01/15/2022  DOS: 01/20/2022    Brief hospital course: 71 y.o. male with medical history significant of CAD s/p bypass in 1998,Hx of PE and DVT on Eliquis, HTN, alcohol abuse with hx of withdrawal, opioid dependency previous on Suboxone presented with acute left shoulder and left neck pain.  Patient appeared to be a limited historian.Frustrated that questions are being asked.Has not been on any of his medications.Reportedly patient lost his wife last year and moved out to [place] to live with his brother. However did not get along with his sister-in-law and has recently moved back here to [place] last week and noted to be homeless and living in [place].Noted to have multiple admissions recently in [place] for alcohol withdrawal.  In 10/2021 he was admitted following a fall and was noted to have a large left-sided chest wall hematoma  had multiple I&D and wound VAC in January due to persistent refractory hematoma, found to have chronically dislocated left shoulder arthroplasty in the setting of likely infection. In Care Everywhere 02/06/2021-CTA showed large burden of thromboembolic disease with central pulmonary artery distribution.  Saw Pulm in office with Dr Janett Billow at [facility]- who noted 'it sounds like his pulmonary embolism was associated with some lower extremity cellulitis which he was hospitalized and minimally mobile. He was treated with Eliquis, now off."  11/15/21- was discharged from [facility] a fall with chest wall hematoma/blood loss anemia chronic dislocation of left shoulder-had multiple procedures back treatment- was discharged on oral doxycycline  In the ED, hemodynamically stable afebrile Labs with anemia of 9.1 with no recent prior for  comparison.stable BMP, EKG -atrial flutter and 4-1 conduction with RBBB,BNP of 498.  Troponin reassuring at 15-->17.Left shoulder x-ray  showing anterior dislocation of left reverse total shoulder arthroplasty.Chest x-ray with clear lungs./Cardiomegaly.Further labs with ESR 44 D-dimer 1.1 TSH 1.5 COVID flu negative alcohol less than 10 UDS positive for benzo. Patient was admitted with IV vancomycin after dose of Lasix.  2/26- s/pCTA-no PE, showed cardiomegaly with reflux of contrast into the IVC and hepatic vein suggesting right heart dysfunction, pulmonary artery hypertension, thoracic aortic aneurysm up to 4.7 cm advised semiannual imaging follow-up CT or MRI and referral to cardiothoracic surgery as outpatient.Seen by orthopedic surgeon in consultation had CT of left shoulder-showing postsurgical changes and dislocation, large periprosthetic fluid collection surrounding the proximal humerus and extending throughout the left axillary region up to 7.8 X4.4X 8.8-sequelae of postoperative seroma, hematoma versus particle disease. 2/26- Echo with new onset of reduced EF-cards following. 2/27-CT-guided biopsy of left anterior axillary fluid 30 mL cloudy sent for culture.  2/28- Underwent removal of shoulder implant with excision of sinus tract, antibiotic spacer placement, and wound vac by Dr. Marlou Sa.  3/1- ID consulted, recommending protracted IV abx course.   Assessment and Plan: Prosthetic left shoulder septic arthritis, history of wound culture growing MRSA Dec 2022: s/p sinus tract excision, washout, abx spacer and wound vac placement 2/28 by Dr. Marlou Sa.  - Follow up wound cultures (neg gram stains) reincubated currently - Continue IV vancomycin per ID who is attempting to arrange weekly outpatient infusions of dalbavancin (needs transportation, housing) after oritavancin load here. Anticipate at least 6 weeks IV Tx required.  - Continue oxycodone dose   - Dietitian consulted to assist with optimizing nutritional status.  Homelessness: Complicates use of protracted course of IV antibiotics.  - CSW/CM consulted.   Alcohol abuse: No  recent EtOH  and no evidence of withdrawal currently.  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  - GDMT limited by hypotension. - Converted to PO lasix per cardiology, decreasing dose to 86m daily. Will monitor K, Mg (required supplementation this AM). Net negative ~11L since admission.  - Monitor I/O, weights.   Atrial flutter, new diagnosis:  - Rate controlled without Tx. Avoiding AV nodal agents due to ~3 second pause noted 01/17/2022.  - Keep K > 4, Mg > 2.  - Not on anticoagulation due to history of hematoma from fall, ongoing alcohol abuse, homelessness, so not a candidate for rhythm control/DCCV  CAD s/p 4v CABG 1998: Nonischemic stress test Oct 2022.  - Continue ASA, statin, not on BB due to sinus pause as above.   Anemia: Suspect multifactorial due to chronic disease and possible malnutrition.  - Dietitian consulted.  - Trending slowly down, will continue checking until stable.   Thoracic aortic aneurysm: 4.cm by CTA - Repeat imaging in 6-12 months  History of PE: Completed eliquis for PE Dx March 2022. CTA negative this admission.   AKI: Resolved.   Obesity: Estimated body mass index is 31.01 kg/m as calculated from the following:   Height as of this encounter: 5' 7"  (1.702 m).   Weight as of this encounter: 89.8 kg.  Subjective: Didn't sleep well so has been napping through the day. Pain better controlled, comfortable when not moving it as instructed. No bleeding noted.   Objective: Vitals:   01/19/22 0832 01/19/22 1650 01/19/22 2005 01/20/22 1022  BP: (!) 95/57 100/65 102/64 (!) 104/53  Pulse: (!) 53 60 72 62  Resp:  16 17 19   Temp: 98.2 F (36.8 C) 98.5 F (36.9 C) 98.1 F (36.7 C) 97.9 F (36.6 C)  TempSrc: Oral Oral Oral Oral  SpO2: 100% 100% 100% 100%  Weight:      Height:       Gen: 71y.o. male in no distress, chronically ill-appearing Pulm: Nonlabored breathing room air. Clear. CV: Regular  with borderline bradycardic rate without murmur, rub, or gallop. No JVD, no significant dependent edema. GI: Abdomen soft, non-tender, non-distended, with normoactive bowel sounds.  Ext: Warm, no deformities. LUE in sling, immobilized, anterior wound vac in place, no erythema or purulence. Distally NVI. Skin: No other/new rashes, lesions or ulcers on visualized skin. Posterolateral drain site in shoulder is c/d/i s/p removal. Neuro: Alert and oriented. No focal neurological deficits. Psych: Judgement and insight appear fair. Mood euthymic & affect congruent. Behavior is appropriate.    Data Personally reviewed: CBC: Recent Labs  Lab 01/15/22 2130 01/16/22 0530 01/18/22 2246 01/19/22 0328 01/20/22 0433  WBC 5.1 3.9* 10.0 9.3 6.2  NEUTROABS 3.4  --   --   --   --   HGB 9.1* 8.9* 9.1* 9.1* 8.6*  HCT 30.6* 29.8* 31.1* 30.0* 28.4*  MCV 88.2 87.9 88.9 87.2 87.9  PLT 238 252 275 309 2270  Basic Metabolic Panel: Recent Labs  Lab 01/15/22 2130 01/17/22 0231 01/18/22 0431 01/18/22 0822 01/18/22 2246 01/19/22 0544 01/20/22 0433  NA 137 134*  --  135  --  139 138  K 4.3 4.3  --  4.0  --  4.0 3.9  CL 108 107  --  106  --  104 104  CO2 20* 23  --  24  --  27 27  GLUCOSE 75 79  --  138*  --  133* 117*  BUN  21 29*  --  27*  --  23 24*  CREATININE 0.95 1.28* 0.96 0.89 0.91 0.90 0.89  CALCIUM 8.5* 7.9*  --  7.9*  --  7.8* 8.0*  MG  --  1.9  --  1.7  --   --  1.5*   GFR: Estimated Creatinine Clearance: 83.8 mL/min (by C-G formula based on SCr of 0.89 mg/dL). Liver Function Tests: Recent Labs  Lab 01/15/22 2130 01/17/22 0231  AST 22  --   ALT 16  --   ALKPHOS 95  --   BILITOT 0.9  --   PROT 6.1*  --   ALBUMIN 2.8* 2.3*   No results for input(s): LIPASE, AMYLASE in the last 168 hours. No results for input(s): AMMONIA in the last 168 hours. Coagulation Profile: No results for input(s): INR, PROTIME in the last 168 hours. Cardiac Enzymes: No results for input(s): CKTOTAL, CKMB,  CKMBINDEX, TROPONINI in the last 168 hours. BNP (last 3 results) No results for input(s): PROBNP in the last 8760 hours. HbA1C: No results for input(s): HGBA1C in the last 72 hours. CBG: No results for input(s): GLUCAP in the last 168 hours. Lipid Profile: No results for input(s): CHOL, HDL, LDLCALC, TRIG, CHOLHDL, LDLDIRECT in the last 72 hours. Thyroid Function Tests: No results for input(s): TSH, T4TOTAL, FREET4, T3FREE, THYROIDAB in the last 72 hours. Anemia Panel: No results for input(s): VITAMINB12, FOLATE, FERRITIN, TIBC, IRON, RETICCTPCT in the last 72 hours. Urine analysis:    Component Value Date/Time   COLORURINE YELLOW 02/20/2018 [place] 02/20/2018 1205   LABSPEC 1.019 02/20/2018 1205   PHURINE 5.0 02/20/2018 1205   GLUCOSEU NEGATIVE 02/20/2018 1205   HGBUR NEGATIVE 02/20/2018 1205   [place] 02/20/2018 1205   [place] 02/20/2018 1205   PROTEINUR NEGATIVE 02/20/2018 1205   UROBILINOGEN 1.0 07/16/2013 1100   NITRITE NEGATIVE 02/20/2018 1205   LEUKOCYTESUR NEGATIVE 02/20/2018 1205   Recent Results (from the past 240 hour(s))  Resp Panel by RT-PCR (Flu A&B, Covid) Nasopharyngeal Swab     Status: None   Collection Time: 01/15/22 11:38 PM   Specimen: Nasopharyngeal Swab; Nasopharyngeal(NP) swabs in vial transport medium  Result Value Ref Range Status   SARS Coronavirus 2 by RT PCR NEGATIVE NEGATIVE Final    Comment: (NOTE) SARS-CoV-2 target nucleic acids are NOT DETECTED.  The SARS-CoV-2 RNA is generally detectable in upper respiratory specimens during the acute phase of infection. The lowest concentration of SARS-CoV-2 viral copies this assay can detect is 138 copies/mL. A negative result does not preclude SARS-Cov-2 infection and should not be used as the sole basis for treatment or other patient management decisions. A negative result may occur with  improper specimen collection/handling, submission of specimen other than  nasopharyngeal swab, presence of viral mutation(s) within the areas targeted by this assay, and inadequate number of viral copies(<138 copies/mL). A negative result must be combined with clinical observations, patient history, and epidemiological information. The expected result is Negative.  Fact Sheet for Patients:  EntrepreneurPulse.com.au  Fact Sheet for Healthcare Providers:  IncredibleEmployment.be  This test is no t yet approved or cleared by the [place] FDA and  has been authorized for detection and/or diagnosis of SARS-CoV-2 by FDA under an Emergency Use Authorization (EUA). This EUA will remain  in effect (meaning this test can be used) for the duration of the COVID-19 declaration under Section 564(b)(1) of the Act, 21 U.S.C.section 360bbb-3(b)(1), unless the authorization is terminated  or revoked sooner.  Influenza A by PCR NEGATIVE NEGATIVE Final   Influenza B by PCR NEGATIVE NEGATIVE Final    Comment: (NOTE) The Xpert Xpress SARS-CoV-2/FLU/RSV plus assay is intended as an aid in the diagnosis of influenza from Nasopharyngeal swab specimens and should not be used as a sole basis for treatment. Nasal washings and aspirates are unacceptable for Xpert Xpress SARS-CoV-2/FLU/RSV testing.  Fact Sheet for Patients: EntrepreneurPulse.com.au  Fact Sheet for Healthcare Providers: IncredibleEmployment.be  This test is not yet approved or cleared by the [place] FDA and has been authorized for detection and/or diagnosis of SARS-CoV-2 by FDA under an Emergency Use Authorization (EUA). This EUA will remain in effect (meaning this test can be used) for the duration of the COVID-19 declaration under Section 564(b)(1) of the Act, 21 U.S.C. section 360bbb-3(b)(1), unless the authorization is terminated or revoked.  Performed at [facility], [place] [address], [place], [place] [address]   MRSA Next Gen by PCR, Nasal     Status: Abnormal   Collection Time: 01/16/22 12:47 AM   Specimen: Nasal Mucosa; Nasal Swab  Result Value Ref Range Status   MRSA by PCR Next Gen DETECTED (A) NOT DETECTED Final    Comment: (NOTE) The GeneXpert MRSA Assay (FDA approved for NASAL specimens only), is one component of a comprehensive MRSA colonization surveillance program. It is not intended to diagnose MRSA infection nor to guide or monitor treatment for MRSA infections. Test performance is not FDA approved in patients less than 33 years old. Performed at [facility], [place] [address], [place], [place] [address]   Aerobic/Anaerobic Culture w Gram Stain (surgical/deep wound)     Status: None (Preliminary result)   Collection Time: 01/17/22  2:52 PM   Specimen: Abscess  Result Value Ref Range Status   Specimen Description   Final    ABSCESS ABSCESS Performed at [place] [address], [place], [place] [address]    Special Requests   Final    LEFT Performed at [facility], [place] [address], [place], [place] [address]    Gram Stain   Final    NO SQUAMOUS EPITHELIAL CELLS SEEN FEW WBC SEEN NO ORGANISMS SEEN Performed at [facility], [place] [address], [place], [place] [address]    Culture   Final    RARE STAPHYLOCOCCUS AUREUS SUSCEPTIBILITIES TO FOLLOW NO ANAEROBES ISOLATED; CULTURE IN PROGRESS FOR 5 DAYS    Report Status PENDING  Incomplete  Aerobic/Anaerobic Culture w Gram Stain (surgical/deep wound)     Status: None (Preliminary result)   Collection Time: 01/18/22  6:41 PM   Specimen: PATH Other; Tissue  Result Value Ref Range Status   Specimen Description SHOULDER JOINT FLUID  Final   Special Requests SWAB A  Final   Gram Stain   Final    NO SQUAMOUS EPITHELIAL CELLS SEEN FEW WBC SEEN NO ORGANISMS SEEN    Culture   Final    NO GROWTH 2 DAYS NO ANAEROBES ISOLATED; CULTURE IN  PROGRESS FOR 5 DAYS Performed at [facility], [place] [address] [place], [place] [address]    Report Status PENDING  Incomplete  Aerobic/Anaerobic Culture w Gram Stain (surgical/deep wound)     Status: None (Preliminary result)   Collection Time: 01/18/22  6:46 PM   Specimen: PATH Other; Tissue  Result Value Ref Range Status   Specimen Description SHOULDER LEFT JOINT FLUID  Final   Special Requests SWAB B  Final  Gram Stain   Final    NO SQUAMOUS EPITHELIAL CELLS SEEN FEW WBC SEEN NO ORGANISMS SEEN    Culture   Final    NO GROWTH 2 DAYS NO ANAEROBES ISOLATED; CULTURE IN PROGRESS FOR 5 DAYS Performed at [facility], [place] [address], [place], [place] [address]    Report Status PENDING  Incomplete  Aerobic/Anaerobic Culture w Gram Stain (surgical/deep wound)     Status: None (Preliminary result)   Collection Time: 01/18/22  6:46 PM   Specimen: PATH Other; Tissue  Result Value Ref Range Status   Specimen Description SHOULDER LEFT JOINT FLUID  Final   Special Requests SWAB C  Final   Gram Stain   Final    NO SQUAMOUS EPITHELIAL CELLS SEEN FEW WBC SEEN NO ORGANISMS SEEN    Culture   Final    NO GROWTH 2 DAYS NO ANAEROBES ISOLATED; CULTURE IN PROGRESS FOR 5 DAYS Performed at [facility], [place] [address], [place], [place] [address]    Report Status PENDING  Incomplete     DG Shoulder Left Port  Result Date: 01/18/2022 CLINICAL DATA:  Left shoulder arthroplasty removal EXAM: LEFT SHOULDER COMPARISON:  01/15/2022 FINDINGS: Frontal view of the left shoulder demonstrates interval removal of the left shoulder arthroplasty, with methylmethacrylate placed within the left humerus. Surgical drain identified. No acute fractures. IMPRESSION: 1. Removal of left shoulder arthroplasty with methylmethacrylate placement as above. Electronically Signed   By: Randa Ngo M.D.   On: 01/18/2022 22:27   [place] EKG SITE RITE  Result Date: 01/18/2022 If [facility] image not attached,  placement could not be confirmed due to current cardiac rhythm.   Disposition: Status is: Inpatient Remains inpatient appropriate because: Culture results and need for IV abx.  Planned Discharge Destination:  TBD  - see notes.  Patrecia Pour, MD 01/20/2022 1:35 PM Page by [email or web address]

## 2022-01-20 NOTE — Plan of Care (Signed)
  Problem: Education: Goal: Ability to demonstrate management of disease process will improve Outcome: Progressing Goal: Ability to verbalize understanding of medication therapies will improve Outcome: Progressing   

## 2022-01-20 NOTE — Progress Notes (Signed)
Brief ID Progress Note:  ? ?Working on plan for treatment for Mr. Vandeusen - we have secure Dalvance for treatment and had infusion clinic perform cost analysis for infusion tx related charges, which would be covered fully.  ?Limiting factor here is transportation to get to infusion center for appointment.  ? ?I contacted [place], [place] and they are unable to supplement any PICC/medical needs. Also only day center for him and would still need to refer for transitional housing - recommend considering referral to [facility] for temporary housing for 8 weeks if possible to allow him a clean, stable area to recover from surgery and treat infection. I understand this is mostly used as a winter site for housing, but I believe there may still be a way we can refer if a room is available.  ?I reached out to CM/LCW team yesterday also who has been working on Mr. Backlund case requesting short term transportation assistance to infusion appointments, pending response.  ? ?Ideally we could load with Oritavancin here and set up for weekly infusions of dalvance x 2/3 doses prior to transitioning to oral doxycycline or bactrim.  ?Linezolid is not idea with other mental health medications d/t higher risk for serotonin syndrome.  ? ? ?Wound culture from January 2023:  ? ? ?Will continue to follow.  ? ?Intraop cultures seem to be re incubating something that is growing.  ? ? ?Janene Madeira, MSN, NP-C ?[place] [facility] ?[facility]  ?[email or web address] ?Pager: [phone] ?Office: [phone] ?[facility] Main Line: [phone] ?*Secure Chat Communication Welcome  ?

## 2022-01-21 DIAGNOSIS — I5043 Acute on chronic combined systolic (congestive) and diastolic (congestive) heart failure: Secondary | ICD-10-CM | POA: Diagnosis not present

## 2022-01-21 DIAGNOSIS — Z5900 Homelessness unspecified: Secondary | ICD-10-CM | POA: Diagnosis not present

## 2022-01-21 DIAGNOSIS — E43 Unspecified severe protein-calorie malnutrition: Secondary | ICD-10-CM

## 2022-01-21 DIAGNOSIS — S41002A Unspecified open wound of left shoulder, initial encounter: Secondary | ICD-10-CM | POA: Diagnosis not present

## 2022-01-21 DIAGNOSIS — F1010 Alcohol abuse, uncomplicated: Secondary | ICD-10-CM | POA: Diagnosis not present

## 2022-01-21 DIAGNOSIS — R7989 Other specified abnormal findings of blood chemistry: Secondary | ICD-10-CM

## 2022-01-21 DIAGNOSIS — I483 Typical atrial flutter: Secondary | ICD-10-CM | POA: Diagnosis not present

## 2022-01-21 DIAGNOSIS — F1111 Opioid abuse, in remission: Secondary | ICD-10-CM

## 2022-01-21 LAB — CBC
HCT: 26.9 % — ABNORMAL LOW (ref 39.0–52.0)
Hemoglobin: 8.3 g/dL — ABNORMAL LOW (ref 13.0–17.0)
MCH: 26.7 pg (ref 26.0–34.0)
MCHC: 30.9 g/dL (ref 30.0–36.0)
MCV: 86.5 fL (ref 80.0–100.0)
Platelets: 301 10*3/uL (ref 150–400)
RBC: 3.11 MIL/uL — ABNORMAL LOW (ref 4.22–5.81)
RDW: 17.2 % — ABNORMAL HIGH (ref 11.5–15.5)
WBC: 8.4 10*3/uL (ref 4.0–10.5)
nRBC: 0 % (ref 0.0–0.2)

## 2022-01-21 LAB — BASIC METABOLIC PANEL
Anion gap: 8 (ref 5–15)
BUN: 40 mg/dL — ABNORMAL HIGH (ref 8–23)
CO2: 23 mmol/L (ref 22–32)
Calcium: 8 mg/dL — ABNORMAL LOW (ref 8.9–10.3)
Chloride: 104 mmol/L (ref 98–111)
Creatinine, Ser: 1.1 mg/dL (ref 0.61–1.24)
GFR, Estimated: >60 mL/min (ref >60)
Glucose, Bld: 103 mg/dL — ABNORMAL HIGH (ref 70–99)
Potassium: 3.5 mmol/L (ref 3.5–5.1)
Sodium: 135 mmol/L (ref 135–145)

## 2022-01-21 LAB — COPPER, SERUM: Copper: 97 ug/dL (ref 69–132)

## 2022-01-21 LAB — ZINC: Zinc: 45 ug/dL (ref 44–115)

## 2022-01-21 LAB — MAGNESIUM: Magnesium: 1.7 mg/dL (ref 1.7–2.4)

## 2022-01-21 LAB — VANCOMYCIN, TROUGH: Vancomycin Tr: 18 ug/mL (ref 15–20)

## 2022-01-21 LAB — VANCOMYCIN, PEAK: Vancomycin Pk: 26 ug/mL — ABNORMAL LOW (ref 30–40)

## 2022-01-21 MED ORDER — POTASSIUM CHLORIDE CRYS ER 20 MEQ PO TBCR
40.0000 meq | EXTENDED_RELEASE_TABLET | Freq: Once | ORAL | Status: AC
  Administered 2022-01-21: 40 meq via ORAL
  Filled 2022-01-21: qty 2

## 2022-01-21 MED ORDER — ASCORBIC ACID 500 MG PO TABS
500.0000 mg | ORAL_TABLET | Freq: Every day | ORAL | Status: DC
  Administered 2022-01-21 – 2022-02-08 (×19): 500 mg via ORAL
  Filled 2022-01-21 (×19): qty 1

## 2022-01-21 MED ORDER — MAGNESIUM SULFATE 2 GM/50ML IV SOLN
2.0000 g | Freq: Once | INTRAVENOUS | Status: AC
Start: 2022-01-21 — End: 2022-01-21
  Administered 2022-01-21: 2 g via INTRAVENOUS
  Filled 2022-01-21: qty 50

## 2022-01-21 MED ORDER — ZINC SULFATE 220 (50 ZN) MG PO CAPS
220.0000 mg | ORAL_CAPSULE | Freq: Every day | ORAL | Status: AC
  Administered 2022-01-21 – 2022-02-03 (×14): 220 mg via ORAL
  Filled 2022-01-21 (×14): qty 1

## 2022-01-21 MED ORDER — VITAMIN D 25 MCG (1000 UNIT) PO TABS
1000.0000 [IU] | ORAL_TABLET | Freq: Every day | ORAL | Status: DC
  Administered 2022-01-21 – 2022-03-08 (×47): 1000 [IU] via ORAL
  Filled 2022-01-21 (×48): qty 1

## 2022-01-21 MED ORDER — OXYCODONE HCL 5 MG PO TABS
10.0000 mg | ORAL_TABLET | ORAL | Status: AC | PRN
  Administered 2022-01-21 – 2022-02-15 (×115): 10 mg via ORAL
  Filled 2022-01-21 (×119): qty 2

## 2022-01-21 NOTE — TOC Progression Note (Signed)
Transition of Care (TOC) - Progression Note  ? ? ?Patient Details  ?Name: Jeremiah [name] ?MRN: [id number] ?Date of Birth: 04/28/1951 ? ?Transition of Care (TOC) CM/SW Contact  ?Marilu Favre, RN ?Phone Number: ?01/21/2022, 12:56 PM ? ?Clinical Narrative:    ? ?Awaiting update regarding  Mining engineer at [facility] to see if any housing available ? ?Expected Discharge Plan: [place] ?Barriers to Discharge: Continued Medical Work up ? ?Expected Discharge Plan and Services ?Expected Discharge Plan: [place] ?  ?Discharge Planning Services: CM Consult ?  ?Living arrangements for the past 2 months: [place] ?                ?  ?  ?  ?  ?  ?  ?  ?  ?  ?  ? ? ?Social Determinants of Health (SDOH) Interventions ?  ? ?Readmission Risk Interventions ?No flowsheet data found. ? ?

## 2022-01-21 NOTE — Progress Notes (Signed)
Brief Nutrition Note ? ?Vitamin D resulted low at 13.65. However, CRP also high at this time which can cause Vitamin D to appear artificially low. However, will add supplementation of 1000 IU daily as level is still likely low despite the elevated CRP.  ? ?Will also add zinc and vitamin C to aid in wound healing. ? ?Ranell Patrick, RD, LDN ?Clinical Dietitian ?RD pager # available in [place]  ?After hours/weekend pager # available in [place] ? ?

## 2022-01-21 NOTE — Progress Notes (Signed)
Pt slept in brief intervals throughout the night. Resting soundly mid morning. Medicated x1 for reports of pain to left shoulder/arm-partial effects noted. Pt up to bedside commode with one person assist. No distress noted.  ?

## 2022-01-21 NOTE — Progress Notes (Signed)
Mobility Specialist Progress Note: ? ? 01/21/22 1632  ?Mobility  ?Activity Ambulated with assistance in hallway  ?Level of Assistance Modified independent, requires aide device or extra time  ?Assistive Device [place]  ?Distance Ambulated (ft) 100 ft  ?Activity Response Tolerated well  ?$Mobility charge 1 Mobility  ? ?Pt received in chair willing to participate in mobility. No complaints of pain. Pt left in chair with call bell in reach and all needs met. ? ?Kristel Durkee ?Mobility Specialist ?Primary Phone [phone] ? ?

## 2022-01-21 NOTE — Progress Notes (Signed)
Occupational Therapy Treatment ?Patient Details ?Name: Jeremiah [name] ?MRN: [id number] ?DOB: 01-03-1951 ?Today's Date: 01/21/2022 ? ? ?History of present illness Pt is a 71 y/o M presenting to ED on 2/25 wtih L shoulder wound with pain radiating to chest. Found to have L shoulder anteromedial dislocation after fall 3 months ago. S/p removal of reverse shoulder implant with antibiotic spacer on 2/28. PMH includes CAD s/p bypass, hx of PE and DVT on eliquis, HTN and alcohol abuse. ?  ?OT comments ? Pt readily willing to work with OT on ADLs. Complaining of stomach ache until having a BM. Pt needing moderate assistance for UB bathing and dressing and set up for pericare and grooming in sitting. Pt ambulating to bathroom with cane and min guard assist.   ? ?Recommendations for follow up therapy are one component of a multi-disciplinary discharge planning process, led by the attending physician.  Recommendations may be updated based on patient status, additional functional criteria and insurance authorization. ?   ?Follow Up Recommendations ? Follow physician's recommendations for discharge plan and follow up therapies  ?  ?Assistance Recommended at Discharge Intermittent Supervision/Assistance  ?Patient can return home with the following ? A little help with walking and/or transfers;A lot of help with bathing/dressing/bathroom;Assistance with cooking/housework ?  ?Equipment Recommendations ? None recommended by OT;Other (comment)  ?  ?Recommendations for Other Services   ? ?  ?Precautions / Restrictions Precautions ?Precautions: Fall ?Precaution Comments: L shoulder wound vac ?Required Braces or Orthoses: Sling ?Restrictions ?Weight Bearing Restrictions: Yes ?LUE Weight Bearing: Non weight bearing ?Other Position/Activity Restrictions: sling on at all times except for dressing/exercise, AROM of hand/wrist/elbow ok, no shoulder AROM/PROM per MD  ? ? ?  ? ?Mobility Bed Mobility ?  ?  ?  ?  ?  ?  ?  ?General bed mobility  comments: pt in chair ?  ? ?Transfers ?Overall transfer level: Needs assistance ?Equipment used: Straight cane ?Transfers: Sit to/from Stand ?Sit to Stand: Supervision ?  ?  ?  ?  ?  ?  ?  ?  ?Balance Overall balance assessment: Needs assistance ?  ?Sitting balance-Leahy Scale: Good ?  ?  ?Standing balance support: Single extremity supported ?Standing balance-Leahy Scale: Fair ?  ?  ?  ?  ?  ?  ?  ?  ?  ?  ?  ?  ?   ? ?ADL either performed or assessed with clinical judgement  ? ?ADL Overall ADL's : Needs assistance/impaired ?  ?  ?Grooming: Dance movement psychotherapist;Wash/dry hands;Sitting;Set up ?  ?Upper Body Bathing: Moderate assistance;Sitting ?  ?  ?  ?Upper Body Dressing : Maximal assistance;Moderate assistance;Sitting ?Upper Body Dressing Details (indicate cue type and reason): max for sling, mod to change gown ?  ?  ?Toilet Transfer: Min guard;Ambulation;Comfort height toilet;Grab bars (cane) ?  ?Toileting- Clothing Manipulation and Hygiene: Set up;Sitting/lateral lean ?  ?  ?  ?Functional mobility during ADLs: Min guard;Cane ?  ?  ? ?Extremity/Trunk Assessment   ?  ?  ?  ?  ?  ? ?Vision   ?  ?  ?Perception   ?  ?Praxis   ?  ? ?Cognition Arousal/Alertness: Awake/alert ?Behavior During Therapy: [facility] for tasks assessed/performed ?Overall Cognitive Status: Within Functional Limits for tasks assessed ?  ?  ?  ?  ?  ?  ?  ?  ?  ?  ?  ?  ?  ?  ?  ?  ?General Comments: pt with concerns about leaving  his suitcase in [place], [place] and wanting therapist to find it ?  ?  ?   ?Exercises Exercises: General Upper Extremity ?General Exercises - Upper Extremity ?Elbow Flexion: Left, 5 reps, Seated, AROM ?Elbow Extension: AROM, Left, 5 reps, Seated ?Wrist Flexion: AROM, Both, 5 reps, Seated ?Wrist Extension: AROM, Left, 5 reps, Seated ?Digit Composite Flexion: AROM, 5 reps, Left, Seated ?Composite Extension: AROM, Left, 5 reps, Seated ? ?  ?Shoulder Instructions   ? ? ?  ?General Comments    ? ? ?Pertinent Vitals/ Pain       Pain  Assessment ?Pain Assessment: Faces ?Faces Pain Scale: Hurts little more ?Pain Location: stomach ?Pain Descriptors / Indicators: Aching ?Pain Intervention(s): Other (comment) (had BM) ? ?Home Living   ?  ?  ?  ?  ?  ?  ?  ?  ?  ?  ?  ?  ?  ?  ?  ?  ?  ?  ? ?  ?Prior Functioning/Environment    ?  ?  ?  ?   ? ?Frequency ? Min 3X/week  ? ? ? ? ?  ?Progress Toward Goals ? ?OT Goals(current goals can now be found in the care plan section) ? Progress towards OT goals: Progressing toward goals ? ?Acute Rehab OT Goals ?OT Goal Formulation: With patient ?Time For Goal Achievement: 02/02/22 ?Potential to Achieve Goals: Good  ?Plan Discharge plan remains appropriate   ? ?Co-evaluation ? ? ?   ?  ?  ?  ?  ? ?  ?AM-PAC OT "6 Clicks" Daily Activity     ?Outcome Measure ? ? Help from another person eating meals?: None ?Help from another person taking care of personal grooming?: A Little ?Help from another person toileting, which includes using toliet, bedpan, or urinal?: A Little ?Help from another person bathing (including washing, rinsing, drying)?: A Lot ?Help from another person to put on and taking off regular upper body clothing?: A Lot ?Help from another person to put on and taking off regular lower body clothing?: A Lot ?6 Click Score: 16 ? ?  ?End of Session Equipment Utilized During Treatment: Other (comment) (cane) ? ?OT Visit Diagnosis: Unsteadiness on feet (R26.81);Other abnormalities of gait and mobility (R26.89);Muscle weakness (generalized) (M62.81) ?  ?Activity Tolerance Patient tolerated treatment well ?  ?Patient Left in chair;with call bell/phone within reach;with chair alarm set ?  ?Nurse Communication   ?  ? ?   ? ?Time: 4268-3419 ?OT Time Calculation (min): 35 min ? ?Charges: OT General Charges ?$OT Visit: 1 Visit ?OT Treatments ?$Self Care/Home Management : 23-37 mins ? ?Malka So ?01/21/2022, 2:53 PM ?Nestor Lewandowsky, OTR/L ?Acute Rehabilitation Services ?Pager: [phone] ?Office: [phone]  ?

## 2022-01-21 NOTE — Progress Notes (Signed)
Progress Note  Patient Name: Jeremiah [name] Date of Encounter: 01/21/2022  [facility] Cardiologist: Jenkins Rouge, MD   Subjective   Recorded as net even yesterday on p.o. Lasix 20 mg, though had not unmeasured UOP.  Net -11 L on admission.  BP 104/44 this morning.  Cr 1.1.  Denies any chest pain or dyspnea.  Inpatient Medications    Scheduled Meds:  allopurinol  100 mg Oral Daily   vitamin C  500 mg Oral Daily   aspirin EC  81 mg Oral Daily   buPROPion  300 mg Oral q morning   busPIRone  15 mg Oral TID   cholecalciferol  1,000 Units Oral Daily   docusate sodium  100 mg Oral BID   enoxaparin (LOVENOX) injection  40 mg Subcutaneous Q24H   feeding supplement  237 mL Oral BID BM   furosemide  20 mg Oral Daily   gabapentin  300 mg Oral BID   hydrOXYzine  25 mg Oral TID   melatonin  3 mg Oral QHS   multivitamin with minerals  1 tablet Oral Daily   mupirocin ointment  1 application Nasal BID   nutrition supplement (JUVEN)  1 packet Oral BID BM   potassium chloride  40 mEq Oral Once   Ensure Max Protein  11 oz Oral Daily   QUEtiapine  50 mg Oral QHS   rosuvastatin  40 mg Oral Daily   vitamin B-12  1,000 mcg Oral Daily   zinc sulfate  220 mg Oral Daily   Continuous Infusions:  sodium chloride 10 mL/hr at 01/18/22 2302   lactated ringers 10 mL/hr at 01/18/22 1456   magnesium sulfate bolus IVPB     vancomycin 750 mg (01/20/22 2245)   PRN Meds: acetaminophen, ALPRAZolam, hydrOXYzine, menthol-cetylpyridinium **OR** phenol, metoCLOPramide **OR** metoCLOPramide (REGLAN) injection, oxyCODONE   Vital Signs    Vitals:   01/20/22 1951 01/21/22 0432 01/21/22 0435 01/21/22 0728  BP: 103/69 (!) 103/57  (!) 104/44  Pulse: 87 66  69  Resp: 18 16  16   Temp: 99.2 F (37.3 C) 98.5 F (36.9 C)  97.9 F (36.6 C)  TempSrc: Oral Oral  Oral  SpO2: 98% 98%  100%  Weight:   80.8 kg   Height:        Intake/Output Summary (Last 24 hours) at 01/21/2022 0856 Last data filed at 01/20/2022  2040 Gross per 24 hour  Intake 480 ml  Output 500 ml  Net -20 ml    Last 3 Weights 01/21/2022 01/19/2022 01/18/2022  Weight (lbs) 178 lb 2.1 oz 197 lb 15.6 oz 196 lb 10.4 oz  Weight (kg) 80.8 kg 89.8 kg 89.2 kg      Telemetry    Atrial flutter, currently 19s.  personally Reviewed  ECG    Atrial flutter, rate 124, right bundle branch block, left anterior fascicular block- Personally Reviewed  Physical Exam   GEN: No acute distress.   Neck: No JVD Cardiac: RRR, no murmurs, rubs, or gallops.  Respiratory: CTAB GI: Soft, nontender, non-distended  MS: No LE edema; No deformity. Neuro:  Nonfocal  Psych: Normal affect   Labs    High Sensitivity Troponin:   Recent Labs  Lab 01/15/22 2130 01/15/22 2214  TROPONINIHS 15 17      Chemistry Recent Labs  Lab 01/15/22 2130 01/15/22 2130 01/17/22 0231 01/18/22 0431 01/18/22 0822 01/18/22 2246 01/19/22 0544 01/20/22 0433 01/21/22 0105  NA 137  --  134*  --  135  --  139 138 135  K 4.3  --  4.3  --  4.0  --  4.0 3.9 3.5  CL 108  --  107  --  106  --  104 104 104  CO2 20*  --  23  --  24  --  27 27 23   GLUCOSE 75  --  79  --  138*  --  133* 117* 103*  BUN 21  --  29*  --  27*  --  23 24* 40*  CREATININE 0.95  --  1.28*   < > 0.89   < > 0.90 0.89 1.10  CALCIUM 8.5*  --  7.9*  --  7.9*  --  7.8* 8.0* 8.0*  MG  --    < > 1.9  --  1.7  --   --  1.5* 1.7  PROT 6.1*  --   --   --   --   --   --   --   --   ALBUMIN 2.8*  --  2.3*  --   --   --   --   --   --   AST 22  --   --   --   --   --   --   --   --   ALT 16  --   --   --   --   --   --   --   --   ALKPHOS 95  --   --   --   --   --   --   --   --   BILITOT 0.9  --   --   --   --   --   --   --   --   GFRNONAA >60  --  >60   < > >60   < > >60 >60 >60  ANIONGAP 9  --  4*  --  5  --  8 7 8    < > = values in this interval not displayed.     Lipids No results for input(s): CHOL, TRIG, HDL, LABVLDL, LDLCALC, CHOLHDL in the last 168 hours.  Hematology Recent Labs  Lab  01/19/22 0328 01/20/22 0433 01/21/22 0105  WBC 9.3 6.2 8.4  RBC 3.44* 3.23* 3.11*  HGB 9.1* 8.6* 8.3*  HCT 30.0* 28.4* 26.9*  MCV 87.2 87.9 86.5  MCH 26.5 26.6 26.7  MCHC 30.3 30.3 30.9  RDW 17.7* 17.5* 17.2*  PLT 309 239 301    Thyroid  Recent Labs  Lab 01/16/22 0255  TSH 1.588     BNP Recent Labs  Lab 01/15/22 2130  BNP 498.1*     DDimer  Recent Labs  Lab 01/16/22 0255  DDIMER 1.13*      Radiology    No results found.  Cardiac Studies   Echo 01/16/22: 1. Left ventricular ejection fraction, by estimation, is 35 to 40%. The  left ventricle has moderately decreased function. The left ventricle  demonstrates global hypokinesis. The left ventricular internal cavity size  was mildly dilated. There is mild  left ventricular hypertrophy. Left ventricular diastolic parameters are  indeterminate.   2. Right ventricular systolic function is normal. The right ventricular  size is normal. There is normal pulmonary artery systolic pressure.   3. Left atrial size was moderately dilated.   4. The mitral valve is normal in structure. Mild mitral valve  regurgitation. No evidence of mitral stenosis.   5. Tricuspid valve  regurgitation is mild to moderate.   6. The aortic valve is tricuspid. Aortic valve regurgitation is mild.  Aortic valve sclerosis is present, with no evidence of aortic valve  stenosis.   7. Aortic dilatation noted. There is mild dilatation of the ascending  aorta, measuring 43 mm.   8. The inferior vena cava is dilated in size with >50% respiratory  variability, suggesting right atrial pressure of 8 mmHg.      Echo 07/02/21: 1.  Normal left ventricular size, generalized hypokinesis with reduced  ejection fraction of 30 to 35%  2.  Moderate concentric left ventricular hypertrophy  3.  Moderate to severe aortic regurgitation  4.  Enlarged aortic root, 4.27 cm  5.  Mild diastolic flow reversal demonstrated in the descending aorta     Nuclear  stress test 09/08/21: Stress Function Comments  Left ventricular function post-stress is abnormal. Global function is mildly reduced. Post-stress ejection fraction is 46 %.  Patient Profile     71 y.o. male 71 y.o. male with a hx of CAD, s/p CABG, polysubstance abuse, prior falls, GERD, HTN, HLD, chronic systolic heart failure with moderate to severe AI, and RBBB who was seen 01/16/2022 for the evaluation of atrial flutter.  Assessment & Plan    Typical atrial flutter  - rate controlled without AV nodal agents - not a candidate for [facility] and therefore not a candidate for DCCV or ablation - moderately dilated left atrium on echo - 3 sec pause Monday 0359, no further pauses noted on telemetry - no AV nodal blockers given pause and rates controlled, continue to monitor  Need for chronic anticoagulation - hx of large chest wall hematoma after a fall in 10/2021 requiring multiple I&Ds and wound vac in Jan - not a good candidate for [facility] given polysubstance abuse, falls, and homelessness - since he is not a good candidate for [facility], this limits his options for rhythm control  Acute on chronic combined heart failure Aortic insufficiency Aortic root 4.27 cm Medication nonadherence - echo this admission with LVEF 35-40%, similar to  echo 07/02/21 with LVEF 30-35% - he did not take medications for about one month prior to admission - diuresed on 40 mg IV lasix, negative 11L on admission - renal function stable - Unable to add GDMT due to soft BP - Volume status improved, appears euvolemic.  Continue lasix 20 mg daily  NSVT Suspect driven by low magnesium (1.5 3/2), repleted.  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 - nonischemic nuclear stress test 09/08/21 with EF estimated at 46% -  continue ASA and statin   Chronically dislocated left shoulder - In 10/2021 - wound cultures with MRSA - underwent surgery 2/28   Hx of PE Elevated D-dimer - CTA 02/06/21 with large burden of PE with central pulmonary artery distribution - pulmonology placed on eliquis, but now off - noted this was in the setting of lowe extremity cellulitis and minimal mobility - CTA this admission negative for PE   Thoracic aortic aneurysm - 4.7 cm on CTA yesterday - recommend repeat imaging in 6-12 months     Disposition Alcohol abuse His brother and sister-in-law have refused to help the patient given history of alcohol abuse. He was discharged to a homeless  shelter prior to leaving [place] followed last ER visit. - he is currently homeless, not living in a shelter - he states his last drink was 2 weeks ago     [place] will sign off.   Medication Recommendations:  Lasix 20 mg daily, ASA 81 mg daily, rosuvastatin 40 mg daily Other recommendations (labs, testing, etc):  BMET within 1 week Follow up as an outpatient:  Will schedule   For questions or updates, please contact [place] Please consult [email or web address] for contact info under        Signed, Donato Heinz, MD  01/21/2022, 8:56 AM

## 2022-01-21 NOTE — Progress Notes (Signed)
[facility] Infectious Diseases Follow Up Note  Patient Identification: Patient Name: Jeremiah [name] MRN: [id number] [place] Date: 01/15/2022  6:42 PM Age: 71 y.o.Today's Date: 01/21/2022   Reason for Visit: PJI   Principal Problem:   Shoulder wound, left, initial encounter Active Problems:   Acute on chronic combined systolic and diastolic CHF (congestive heart failure) (HCC)   Atrial flutter (HCC)   Shoulder dislocation, recurrent, left   Prolonged QT interval   Normocytic anemia   Alcohol abuse   Elevated d-dimer   Depression   History of pulmonary embolism   B12 deficiency   Hypotension   Left shoulder pain   Thoracic aortic aneurysm (TAA)   CAD (coronary artery disease)   Protein-calorie malnutrition, severe   Antibiotics: Vancomycin 2/25-c  Lines/Hardwares: Bilateral knee arthroplasty  Interval Events: continues to be afebrile, no leukocytosis   Assessment Left shoulder PJI  S/p left shoulder aspiration by IR on 2/27.  Or cultures staph aureus S/p left shoulder excision of sinus tract, removal of reverse shoulder replacement, antibiotic spacer placement and placement of wound VAC.  Or cultures no growth so far  2. 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 Alcohol abuse 4. H/o Opioid dependence 5. Homelessness   Recommendations Continue Vancomycin, pharmacy to dose for now while working on disposition plan   If supervised setting for IV vancomycin is not possible, will plan for weekly long acting injections. However, he also does not have housing as well as transportation for the weekly infusions. He also has h/o alcohol abuse and  twice seen in the ED in February ( [place]) for head injury and alcohol abuse and ams. It would be unsafe for him to discharge on PO antibiotics at this point given he does not have a stable housing and this is a Prosthetic joint infection.    Follow up with case management and social worker Monitor CBC, BMP, Vancomycin trough and OR cultures  Following peripherally over the weekend  Dr Tommy Medal is available this weekend with questions   Rest of the management as per the primary team. Thank you for the consult. Please page with pertinent questions or concerns.  ______________________________________________________________________ Subjective patient seen and examined at the bedside. Eating breakfast  Frustrated and pissed when attempting to talk about antibiotics options for discharge. He wants his shoulder to be put back before discharging him.   Vitals BP (!) 104/44 (BP Location: Right Arm)    Pulse 69    Temp 97.9 F (36.6 C) (Oral)    Resp 16    Ht 5\' 7"  (1.702 m)    Wt 80.8 kg    SpO2 100%    BMI 27.90 kg/m     Physical Exam Constitutional:  sitting up in the bed, appears comfortable    Comments:   Cardiovascular:     Rate and Rhythm: Normal rate and regular rhythm.     Heart sounds:   Pulmonary:     Effort: Pulmonary effort is normal on room air     Comments:   Abdominal: Non distended     Palpations:     Tenderness:   Musculoskeletal:        General: No swelling or tenderness.   Skin:    Comments:   Neurological:     General: left shoulder is wrapped an a bandage with an arm sling  Psychiatric:        Mood and Affect: Mood normal.  Pertinent Microbiology Results for orders placed or performed during the hospital encounter of 01/15/22  Resp Panel by RT-PCR (Flu A&B, Covid) Nasopharyngeal Swab     Status: None   Collection Time: 01/15/22 11:38 PM   Specimen: Nasopharyngeal Swab; Nasopharyngeal(NP) swabs in vial transport medium  Result Value Ref Range Status   SARS Coronavirus 2 by RT PCR NEGATIVE NEGATIVE Final    Comment: (NOTE) SARS-CoV-2 target nucleic acids are NOT DETECTED.  The SARS-CoV-2 RNA is generally detectable in upper respiratory specimens during the acute phase of  infection. The lowest concentration of SARS-CoV-2 viral copies this assay can detect is 138 copies/mL. A negative result does not preclude SARS-Cov-2 infection and should not be used as the sole basis for treatment or other patient management decisions. A negative result may occur with  improper specimen collection/handling, submission of specimen other than nasopharyngeal swab, presence of viral mutation(s) within the areas targeted by this assay, and inadequate number of viral copies(<138 copies/mL). A negative result must be combined with clinical observations, patient history, and epidemiological information. The expected result is Negative.  Fact Sheet for Patients:  EntrepreneurPulse.com.au  Fact Sheet for Healthcare Providers:  IncredibleEmployment.be  This test is no t yet approved or cleared by the [place] FDA and  has been authorized for detection and/or diagnosis of SARS-CoV-2 by FDA under an Emergency Use Authorization (EUA). This EUA will remain  in effect (meaning this test can be used) for the duration of the COVID-19 declaration under Section 564(b)(1) of the Act, 21 U.S.C.section 360bbb-3(b)(1), unless the authorization is terminated  or revoked sooner.       Influenza A by PCR NEGATIVE NEGATIVE Final   Influenza B by PCR NEGATIVE NEGATIVE Final    Comment: (NOTE) The Xpert Xpress SARS-CoV-2/FLU/RSV plus assay is intended as an aid in the diagnosis of influenza from Nasopharyngeal swab specimens and should not be used as a sole basis for treatment. Nasal washings and aspirates are unacceptable for Xpert Xpress SARS-CoV-2/FLU/RSV testing.  Fact Sheet for Patients: EntrepreneurPulse.com.au  Fact Sheet for Healthcare Providers: IncredibleEmployment.be  This test is not yet approved or cleared by the [place] FDA and has been authorized for detection and/or diagnosis of SARS-CoV-2  by FDA under an Emergency Use Authorization (EUA). This EUA will remain in effect (meaning this test can be used) for the duration of the COVID-19 declaration under Section 564(b)(1) of the Act, 21 U.S.C. section 360bbb-3(b)(1), unless the authorization is terminated or revoked.  Performed at [facility], [place] [name], [place], [place] [address]   MRSA Next Gen by PCR, Nasal     Status: Abnormal   Collection Time: 01/16/22 12:47 AM   Specimen: Nasal Mucosa; Nasal Swab  Result Value Ref Range Status   MRSA by PCR Next Gen DETECTED (A) NOT DETECTED Final    Comment: (NOTE) The GeneXpert MRSA Assay (FDA approved for NASAL specimens only), is one component of a comprehensive MRSA colonization surveillance program. It is not intended to diagnose MRSA infection nor to guide or monitor treatment for MRSA infections. Test performance is not FDA approved in patients less than 51 years old. Performed at [facility], [place] [address], [place], [place] [address]   Aerobic/Anaerobic Culture w Gram Stain (surgical/deep wound)     Status: None (Preliminary result)   Collection Time: 01/17/22  2:52 PM   Specimen: Abscess  Result Value Ref Range Status   Specimen Description   Final    ABSCESS ABSCESS  Performed at [facility], [place] [address] [place], [place] [address]    Special Requests   Final    LEFT Performed at [facility], [place] [address], [place], [place] [address]    Gram Stain   Final    NO SQUAMOUS EPITHELIAL CELLS SEEN FEW WBC SEEN NO ORGANISMS SEEN Performed at [facility], [place] [address] [place], [place] [address]    Culture   Final    RARE STAPHYLOCOCCUS AUREUS SUSCEPTIBILITIES TO FOLLOW NO ANAEROBES ISOLATED; CULTURE IN PROGRESS FOR 5 DAYS    Report Status PENDING  Incomplete  Aerobic/Anaerobic Culture w Gram Stain (surgical/deep wound)     Status: None (Preliminary result)    Collection Time: 01/18/22  6:41 PM   Specimen: PATH Other; Tissue  Result Value Ref Range Status   Specimen Description SHOULDER JOINT FLUID  Final   Special Requests SWAB A  Final   Gram Stain   Final    NO SQUAMOUS EPITHELIAL CELLS SEEN FEW WBC SEEN NO ORGANISMS SEEN    Culture   Final    NO GROWTH 2 DAYS NO ANAEROBES ISOLATED; CULTURE IN PROGRESS FOR 5 DAYS Performed at [facility], [place] [address], [place], [place] [address]    Report Status PENDING  Incomplete  Aerobic/Anaerobic Culture w Gram Stain (surgical/deep wound)     Status: None (Preliminary result)   Collection Time: 01/18/22  6:46 PM   Specimen: PATH Other; Tissue  Result Value Ref Range Status   Specimen Description SHOULDER LEFT JOINT FLUID  Final   Special Requests SWAB B  Final   Gram Stain   Final    NO SQUAMOUS EPITHELIAL CELLS SEEN FEW WBC SEEN NO ORGANISMS SEEN    Culture   Final    NO GROWTH 2 DAYS NO ANAEROBES ISOLATED; CULTURE IN PROGRESS FOR 5 DAYS Performed at [facility], [place] [address], [place], [place] [address]    Report Status PENDING  Incomplete  Aerobic/Anaerobic Culture w Gram Stain (surgical/deep wound)     Status: None (Preliminary result)   Collection Time: 01/18/22  6:46 PM   Specimen: PATH Other; Tissue  Result Value Ref Range Status   Specimen Description SHOULDER LEFT JOINT FLUID  Final   Special Requests SWAB C  Final   Gram Stain   Final    NO SQUAMOUS EPITHELIAL CELLS SEEN FEW WBC SEEN NO ORGANISMS SEEN    Culture   Final    NO GROWTH 2 DAYS NO ANAEROBES ISOLATED; CULTURE IN PROGRESS FOR 5 DAYS Performed at [facility], [place] [address] [place], [place] [address]    Report Status PENDING  Incomplete    Pertinent Lab. CBC Latest Ref Rng & Units 01/21/2022 01/20/2022 01/19/2022  WBC 4.0 - 10.5 K/uL 8.4 6.2 9.3  Hemoglobin 13.0 - 17.0 g/dL 8.3(L) 8.6(L) 9.1(L)  Hematocrit 39.0 - 52.0 % 26.9(L) 28.4(L) 30.0(L)  Platelets 150 - 400 K/uL 301 239 309   CMP  Latest Ref Rng & Units 01/21/2022 01/20/2022 01/19/2022  Glucose 70 - 99 mg/dL 103(H) 117(H) 133(H)  BUN 8 - 23 mg/dL 40(H) 24(H) 23  Creatinine 0.61 - 1.24 mg/dL 1.10 0.89 0.90  Sodium 135 - 145 mmol/L 135 138 139  Potassium 3.5 - 5.1 mmol/L 3.5 3.9 4.0  Chloride 98 - 111 mmol/L 104 104 104  CO2 22 - 32 mmol/L 23 27 27   Calcium 8.9 - 10.3 mg/dL 8.0(L) 8.0(L) 7.8(L)  Total Protein  6.5 - 8.1 g/dL - - -  Total Bilirubin 0.3 - 1.2 mg/dL - - -  Alkaline Phos 38 - 126 U/L - - -  AST 15 - 41 U/L - - -  ALT 0 - 44 U/L - - -     Pertinent Imaging today Plain films and CT images have been personally visualized and interpreted; radiology reports have been reviewed. Decision making incorporated into the Impression / Recommendations.  No results found.  I spent more than 35 minutes for this patient encounter including review of prior medical records, coordination of care with primary/other specialist with greater than 50% of time being face to face/counseling and discussing diagnostics/treatment plan with the patient/family.  Electronically signed by:   Rosiland Oz, MD Infectious Disease Physician [facility] for Infectious Disease Pager: [phone]

## 2022-01-21 NOTE — Progress Notes (Signed)
Progress Note  Patient: Jeremiah [name]:[id number] DOB: 13-May-1951  DOA: 01/15/2022  DOS: 01/21/2022    Brief hospital course: 71 y.o. male with medical history significant of CAD s/p bypass in 1998,Hx of PE and DVT on Eliquis, HTN, alcohol abuse with hx of withdrawal, opioid dependency previous on Suboxone presented with acute left shoulder and left neck pain.  Patient appeared to be a limited historian.Frustrated that questions are being asked.Has not been on any of his medications.Reportedly patient lost his wife last year and moved out to [place] to live with his brother. However did not get along with his sister-in-law and has recently moved back here to [place] last week and noted to be homeless and living in [place].Noted to have multiple admissions recently in [place] for alcohol withdrawal.  In 10/2021 he was admitted following a fall and was noted to have a large left-sided chest wall hematoma  had multiple I&D and wound VAC in January due to persistent refractory hematoma, found to have chronically dislocated left shoulder arthroplasty in the setting of likely infection. In Care Everywhere 02/06/2021-CTA showed large burden of thromboembolic disease with central pulmonary artery distribution.  Saw Pulm in office with Dr Janett Billow at [facility]- who noted 'it sounds like his pulmonary embolism was associated with some lower extremity cellulitis which he was hospitalized and minimally mobile. He was treated with Eliquis, now off."  11/15/21- was discharged from [facility] a fall with chest wall hematoma/blood loss anemia chronic dislocation of left shoulder-had multiple procedures back treatment- was discharged on oral doxycycline  In the ED, hemodynamically stable afebrile Labs with anemia of 9.1 with no recent prior for  comparison.stable BMP, EKG -atrial flutter and 4-1 conduction with RBBB,BNP of 498.  Troponin reassuring at 15-->17.Left shoulder x-ray  showing anterior dislocation of left reverse total shoulder arthroplasty.Chest x-ray with clear lungs./Cardiomegaly.Further labs with ESR 44 D-dimer 1.1 TSH 1.5 COVID flu negative alcohol less than 10 UDS positive for benzo. Patient was admitted with IV vancomycin after dose of Lasix.  2/26- s/pCTA-no PE, showed cardiomegaly with reflux of contrast into the IVC and hepatic vein suggesting right heart dysfunction, pulmonary artery hypertension, thoracic aortic aneurysm up to 4.7 cm advised semiannual imaging follow-up CT or MRI and referral to cardiothoracic surgery as outpatient.Seen by orthopedic surgeon in consultation had CT of left shoulder-showing postsurgical changes and dislocation, large periprosthetic fluid collection surrounding the proximal humerus and extending throughout the left axillary region up to 7.8 X4.4X 8.8-sequelae of postoperative seroma, hematoma versus particle disease. 2/26- Echo with new onset of reduced EF-cards following. 2/27-CT-guided biopsy of left anterior axillary fluid 30 mL cloudy sent for culture.  2/28- Underwent removal of shoulder implant with excision of sinus tract, antibiotic spacer placement, and wound vac by Dr. Marlou Sa.  3/1- ID consulted, recommending protracted IV abx course.   Assessment and Plan: Prosthetic left shoulder septic arthritis, history of wound culture growing MRSA Dec 2022: s/p sinus tract excision, washout, abx spacer and wound vac placement 2/28 by Dr. Marlou Sa.  - Wound cultures (neg gram stains) again growing MRSA (sensitive to tetracycline, vancomycin, bactrim. - Continue IV vancomycin per ID who is attempting to arrange weekly outpatient infusions of dalbavancin (needs transportation, housing) after oritavancin load here. Anticipate at least 6 weeks IV Tx required.  - Continue oxycodone prn severe pain - Dietitian consulted to assist with optimizing nutritional status.  Homelessness: Complicates use of protracted course of IV antibiotics.   - CSW/CM consulted.   Alcohol  abuse: No recent EtOH and no evidence of withdrawal currently.  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  - GDMT limited by hypotension. Cardiology signed off 3/3 recommending lasix 20m daily, aspirin 856mdaily, rosuvastatin 4048maily, and they will arrange post-discharge follow up.  - Appears euvolemic, metabolic panel stable.  - Monitor I/O, weights.   Atrial flutter, new diagnosis:  - Rate controlled without Tx. Avoiding AV nodal agents due to ~3 second pause noted 01/17/2022.  - Keep K > 4, Mg > 2. Supplement both today. Sinus tachycardia suggested on ECG 3/2, personally reviewed.  - Not on anticoagulation due to history of hematoma from fall, ongoing alcohol abuse, homelessness, so not a candidate for rhythm control/DCCV  CAD s/p 4v CABG 1998: Nonischemic stress test Oct 2022.  - Continue ASA, statin, not on BB due to sinus pause as above.   Anemia: Suspect multifactorial due to chronic disease and possible malnutrition.  - Dietitian consulted.  - Trending slowly down, will continue checking until stable.   Thoracic aortic aneurysm: 4.cm by CTA - Repeat imaging in 6-12 months  History of PE: Completed eliquis for PE Dx March 2022. CTA negative this admission.   AKI: Resolved.   Obesity: Estimated body mass index is 27.9 kg/m as calculated from the following:   Height as of this encounter: 5' 7"  (1.702 m).   Weight as of this encounter: 80.8 kg.  Subjective: States left shoulder pain is not controlled even though he's keeping it still. Frustrated by bilAnimators room. No fever, chills, or other complaints.   Objective: Vitals:   01/20/22 1951 01/21/22 0432 01/21/22 0435 01/21/22 0728  BP: 103/69 (!) 103/57  (!) 104/44  Pulse: 87 66  69  Resp: 18 16  16   Temp: 99.2 F (37.3 C) 98.5 F (36.9 C)  97.9 F (36.6 C)  TempSrc: Oral Oral  Oral  SpO2: 98% 98%  100%   Weight:   80.8 kg   Height:       Gen: [age] male in no distress Pulm: Nonlabored breathing room air. Clear. CV: Relatively regular. No murmur, rub, or gallop. No JVD, no dependent edema. GI: Abdomen soft, non-tender, non-distended, with normoactive bowel sounds.  Ext: Warm, no deformities Skin: No new rashes, lesions or ulcers on visualized skin. Wound vac stable on left anterior shoulder.  Neuro: Alert and oriented. No focal neurological deficits. Psych: Judgement and insight appear fair. Mood euthymic & affect congruent. Behavior is appropriate.    Data Personally reviewed: CBC: Recent Labs  Lab 01/15/22 2130 01/16/22 0530 01/18/22 2246 01/19/22 0328 01/20/22 0433 01/21/22 0105  WBC 5.1 3.9* 10.0 9.3 6.2 8.4  NEUTROABS 3.4  --   --   --   --   --   HGB 9.1* 8.9* 9.1* 9.1* 8.6* 8.3*  HCT 30.6* 29.8* 31.1* 30.0* 28.4* 26.9*  MCV 88.2 87.9 88.9 87.2 87.9 86.5  PLT 238 252 275 309 239 [id number] Metabolic Panel: Recent Labs  Lab 01/17/22 0231 01/18/22 0431 01/18/22 0822 01/18/22 2246 01/19/22 0544 01/20/22 0433 01/21/22 0105  NA 134*  --  135  --  139 138 135  K 4.3  --  4.0  --  4.0 3.9 3.5  CL 107  --  106  --  104 104 104  CO2 23  --  24  --  27 27 23   GLUCOSE 79  --  138*  --  133* 117* 103*  BUN 29*  --  27*  --  23 24* 40*  CREATININE 1.28*   < > 0.89 0.91 0.90 0.89 1.10  CALCIUM 7.9*  --  7.9*  --  7.8* 8.0* 8.0*  MG 1.9  --  1.7  --   --  1.5* 1.7   < > = values in this interval not displayed.   GFR: Estimated Creatinine Clearance: 64.5 mL/min (by C-G formula based on SCr of 1.1 mg/dL). Liver Function Tests: Recent Labs  Lab 01/15/22 2130 01/17/22 0231  AST 22  --   ALT 16  --   ALKPHOS 95  --   BILITOT 0.9  --   PROT 6.1*  --   ALBUMIN 2.8* 2.3*   No results for input(s): LIPASE, AMYLASE in the last 168 hours. No results for input(s): AMMONIA in the last 168 hours. Coagulation Profile: No results for input(s): INR, PROTIME in the last  168 hours. Cardiac Enzymes: No results for input(s): CKTOTAL, CKMB, CKMBINDEX, TROPONINI in the last 168 hours. BNP (last 3 results) No results for input(s): PROBNP in the last 8760 hours. HbA1C: No results for input(s): HGBA1C in the last 72 hours. CBG: No results for input(s): GLUCAP in the last 168 hours. Lipid Profile: No results for input(s): CHOL, HDL, LDLCALC, TRIG, CHOLHDL, LDLDIRECT in the last 72 hours. Thyroid Function Tests: No results for input(s): TSH, T4TOTAL, FREET4, T3FREE, THYROIDAB in the last 72 hours. Anemia Panel: No results for input(s): VITAMINB12, FOLATE, FERRITIN, TIBC, IRON, RETICCTPCT in the last 72 hours. Urine analysis:    Component Value Date/Time   COLORURINE YELLOW 02/20/2018 [place] 02/20/2018 1205   LABSPEC 1.019 02/20/2018 1205   PHURINE 5.0 02/20/2018 1205   GLUCOSEU NEGATIVE 02/20/2018 1205   HGBUR NEGATIVE 02/20/2018 1205   [place] 02/20/2018 1205   [place] 02/20/2018 1205   PROTEINUR NEGATIVE 02/20/2018 1205   UROBILINOGEN 1.0 07/16/2013 1100   NITRITE NEGATIVE 02/20/2018 1205   LEUKOCYTESUR NEGATIVE 02/20/2018 1205   Recent Results (from the past 240 hour(s))  Resp Panel by RT-PCR (Flu A&B, Covid) Nasopharyngeal Swab     Status: None   Collection Time: 01/15/22 11:38 PM   Specimen: Nasopharyngeal Swab; Nasopharyngeal(NP) swabs in vial transport medium  Result Value Ref Range Status   SARS Coronavirus 2 by RT PCR NEGATIVE NEGATIVE Final    Comment: (NOTE) SARS-CoV-2 target nucleic acids are NOT DETECTED.  The SARS-CoV-2 RNA is generally detectable in upper respiratory specimens during the acute phase of infection. The lowest concentration of SARS-CoV-2 viral copies this assay can detect is 138 copies/mL. A negative result does not preclude SARS-Cov-2 infection and should not be used as the sole basis for treatment or other patient management decisions. A negative result may occur with   improper specimen collection/handling, submission of specimen other than nasopharyngeal swab, presence of viral mutation(s) within the areas targeted by this assay, and inadequate number of viral copies(<138 copies/mL). A negative result must be combined with clinical observations, patient history, and epidemiological information. The expected result is Negative.  Fact Sheet for Patients:  EntrepreneurPulse.com.au  Fact Sheet for Healthcare Providers:  IncredibleEmployment.be  This test is no t yet approved or cleared by the [place] FDA and  has been authorized for detection and/or diagnosis of SARS-CoV-2 by FDA under an Emergency Use Authorization (EUA). This EUA will remain  in effect (meaning this test can be used) for the duration of the COVID-19 declaration under  Section 564(b)(1) of the Act, 21 U.S.C.section 360bbb-3(b)(1), unless the authorization is terminated  or revoked sooner.       Influenza A by PCR NEGATIVE NEGATIVE Final   Influenza B by PCR NEGATIVE NEGATIVE Final    Comment: (NOTE) The Xpert Xpress SARS-CoV-2/FLU/RSV plus assay is intended as an aid in the diagnosis of influenza from Nasopharyngeal swab specimens and should not be used as a sole basis for treatment. Nasal washings and aspirates are unacceptable for Xpert Xpress SARS-CoV-2/FLU/RSV testing.  Fact Sheet for Patients: EntrepreneurPulse.com.au  Fact Sheet for Healthcare Providers: IncredibleEmployment.be  This test is not yet approved or cleared by the [place] FDA and has been authorized for detection and/or diagnosis of SARS-CoV-2 by FDA under an Emergency Use Authorization (EUA). This EUA will remain in effect (meaning this test can be used) for the duration of the COVID-19 declaration under Section 564(b)(1) of the Act, 21 U.S.C. section 360bbb-3(b)(1), unless the authorization is terminated  or revoked.  Performed at [facility], [place] [address] [place], [place] [address]   MRSA Next Gen by PCR, Nasal     Status: Abnormal   Collection Time: 01/16/22 12:47 AM   Specimen: Nasal Mucosa; Nasal Swab  Result Value Ref Range Status   MRSA by PCR Next Gen DETECTED (A) NOT DETECTED Final    Comment: (NOTE) The GeneXpert MRSA Assay (FDA approved for NASAL specimens only), is one component of a comprehensive MRSA colonization surveillance program. It is not intended to diagnose MRSA infection nor to guide or monitor treatment for MRSA infections. Test performance is not FDA approved in patients less than 13 years old. Performed at [facility], [place] [address], [place], [place] [address]   Aerobic/Anaerobic Culture w Gram Stain (surgical/deep wound)     Status: None (Preliminary result)   Collection Time: 01/17/22  2:52 PM   Specimen: Abscess  Result Value Ref Range Status   Specimen Description   Final    ABSCESS ABSCESS Performed at [place] [address] [place], [place] [address]    Special Requests   Final    LEFT Performed at [facility], [place] [address], [place], [place] [address]    Gram Stain   Final    NO SQUAMOUS EPITHELIAL CELLS SEEN FEW WBC SEEN NO ORGANISMS SEEN Performed at [facility], [place] [address], [place], [place] [address]    Culture   Final    RARE METHICILLIN RESISTANT STAPHYLOCOCCUS AUREUS NO ANAEROBES ISOLATED; CULTURE IN PROGRESS FOR 5 DAYS    Report Status PENDING  Incomplete   Organism ID, Bacteria METHICILLIN RESISTANT STAPHYLOCOCCUS AUREUS  Final      Susceptibility   Methicillin resistant staphylococcus aureus - MIC*    CIPROFLOXACIN >=8 RESISTANT Resistant     ERYTHROMYCIN >=8 RESISTANT Resistant     GENTAMICIN <=0.5 SENSITIVE Sensitive     OXACILLIN >=4 RESISTANT Resistant     TETRACYCLINE <=1 SENSITIVE Sensitive     VANCOMYCIN 1 SENSITIVE  Sensitive     TRIMETH/SULFA <=10 SENSITIVE Sensitive     CLINDAMYCIN >=8 RESISTANT Resistant     RIFAMPIN <=0.5 SENSITIVE Sensitive     Inducible Clindamycin NEGATIVE Sensitive     * RARE METHICILLIN RESISTANT STAPHYLOCOCCUS AUREUS  Aerobic/Anaerobic Culture w Gram Stain (surgical/deep wound)     Status: None (Preliminary result)   Collection Time: 01/18/22  6:41 PM   Specimen: PATH Other; Tissue  Result Value Ref Range Status  Specimen Description SHOULDER JOINT FLUID  Final   Special Requests SWAB A  Final   Gram Stain   Final    NO SQUAMOUS EPITHELIAL CELLS SEEN FEW WBC SEEN NO ORGANISMS SEEN    Culture   Final    NO GROWTH 3 DAYS NO ANAEROBES ISOLATED; CULTURE IN PROGRESS FOR 5 DAYS Performed at [facility], [place] [address] [place], [place] [address]    Report Status PENDING  Incomplete  Aerobic/Anaerobic Culture w Gram Stain (surgical/deep wound)     Status: None (Preliminary result)   Collection Time: 01/18/22  6:46 PM   Specimen: PATH Other; Tissue  Result Value Ref Range Status   Specimen Description SHOULDER LEFT JOINT FLUID  Final   Special Requests SWAB B  Final   Gram Stain   Final    NO SQUAMOUS EPITHELIAL CELLS SEEN FEW WBC SEEN NO ORGANISMS SEEN    Culture   Final    NO GROWTH 3 DAYS NO ANAEROBES ISOLATED; CULTURE IN PROGRESS FOR 5 DAYS Performed at [facility], [place] [address] [place], [place] [address]    Report Status PENDING  Incomplete  Aerobic/Anaerobic Culture w Gram Stain (surgical/deep wound)     Status: None (Preliminary result)   Collection Time: 01/18/22  6:46 PM   Specimen: PATH Other; Tissue  Result Value Ref Range Status   Specimen Description SHOULDER LEFT JOINT FLUID  Final   Special Requests SWAB C  Final   Gram Stain   Final    NO SQUAMOUS EPITHELIAL CELLS SEEN FEW WBC SEEN NO ORGANISMS SEEN    Culture   Final    NO GROWTH 3 DAYS NO ANAEROBES ISOLATED; CULTURE IN PROGRESS FOR 5 DAYS Performed at [place], [place] [address] [place], [place] [address]    Report Status PENDING  Incomplete     No results found.  Disposition: Status is: Inpatient Remains inpatient appropriate because: Culture results and need for IV abx.  Planned Discharge Destination:  TBD  - see notes.  Patrecia Pour, MD 01/21/2022 1:50 PM Page by [email or web address]
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
## 2022-01-21 NOTE — Progress Notes (Signed)
Pharmacy Antibiotic Note ? ?Jeremiah [name] is a 71 y.o. male admitted on 01/15/2022 with medical history significant of CAD s/p bypass in 1998, Hx of PE and DVT on Eliquis, HTN, alcohol abuse with hx of withdrawal, opioid dependency previous on Suboxone, presents with acute onset left shoulder pain and left-sided neck pain. 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 On 2/28 ortho removed infected left shoulder prosthetic, reverse shoulder replacement, and abx spacer placement.  Pharmacy has been consulted for vancomycin dosing. ? ?D6 Vancomycin ?WBC normal, afebrile ?SCr up - 1.10 ? ?3/3 VT: 18    VP: 26  ?Steady state AUC 558.2, Cmax 35, Cmin 17.3 ? ?Plan: ?Continue Vancomycin to 750mg  q12 hr ?Monitor cultures, clinical status, renal function, vancomycin levels ?Narrow abx as able and f/u duration  ? ? ?Height: 5\' 7"  (170.2 cm) ?Weight: 80.8 kg (178 lb 2.1 oz) ?IBW/kg (Calculated) : 66.1 ? ?Temp (24hrs), Avg:98.3 ?F (36.8 ?C), Min:97.7 ?F (36.5 ?C), Max:99.2 ?F (37.3 ?C) ? ?Recent Labs  ?Lab 01/16/22 ?0530 01/17/22 ?0231 01/18/22 ?1165 01/18/22 ?2246 01/19/22 ?0328 01/19/22 ?7903 01/20/22 ?8333 01/21/22 ?0105 01/21/22 ?8329  ?WBC 3.9*  --   --  10.0 9.3  --  6.2 8.4  --   ?CREATININE  --    < > 0.89 0.91  --  0.90 0.89 1.10  --   ?[place]  --   --   --   --   --   --   --   --  18  ? < > = values in this interval not displayed.  ? ?  ?Estimated Creatinine Clearance: 64.5 mL/min (by C-G formula based on SCr of 1.1 mg/dL).   ? ?Allergies  ?Allergen Reactions  ? Morphine Hives, Swelling and Rash  ?  Has taken with no reaction since original reaction  ? ? ?Antimicrobials this admission: ?Vancomycin 2/26 >> ?PTA Doxy 2/14 >>2/25 (not taking?) ? ?Dose adjustments this admission: ?3/1 vancomycin 1250mg  Q12hr > 750mg  Q12 hr  ? ?Microbiology results: ?2/27 abscess ngtd ?2/28 OR joint fluid: ngtd  ?2/26 MRSA + ? ?Thank you for allowing pharmacy to be a part of this patient?s  care. ? ?Laurey Arrow, PharmD ?PGY1 Pharmacy Resident ?01/21/2022  3:20 PM ? ?Please check [email or web address] for unit-specific pharmacy phone numbers. ? ?

## 2022-01-22 DIAGNOSIS — F32A Depression, unspecified: Secondary | ICD-10-CM

## 2022-01-22 DIAGNOSIS — F1010 Alcohol abuse, uncomplicated: Secondary | ICD-10-CM | POA: Diagnosis not present

## 2022-01-22 DIAGNOSIS — M25512 Pain in left shoulder: Secondary | ICD-10-CM

## 2022-01-22 DIAGNOSIS — T8459XA Infection and inflammatory reaction due to other internal joint prosthesis, initial encounter: Secondary | ICD-10-CM

## 2022-01-22 DIAGNOSIS — S41002A Unspecified open wound of left shoulder, initial encounter: Secondary | ICD-10-CM | POA: Diagnosis not present

## 2022-01-22 DIAGNOSIS — T8459XD Infection and inflammatory reaction due to other internal joint prosthesis, subsequent encounter: Secondary | ICD-10-CM

## 2022-01-22 DIAGNOSIS — I5043 Acute on chronic combined systolic (congestive) and diastolic (congestive) heart failure: Secondary | ICD-10-CM | POA: Diagnosis not present

## 2022-01-22 DIAGNOSIS — I483 Typical atrial flutter: Secondary | ICD-10-CM | POA: Diagnosis not present

## 2022-01-22 LAB — HEMOGLOBIN AND HEMATOCRIT, BLOOD
HCT: 26.6 % — ABNORMAL LOW (ref 39.0–52.0)
Hemoglobin: 7.9 g/dL — ABNORMAL LOW (ref 13.0–17.0)

## 2022-01-22 LAB — BASIC METABOLIC PANEL
Anion gap: 11 (ref 5–15)
BUN: 46 mg/dL — ABNORMAL HIGH (ref 8–23)
CO2: 23 mmol/L (ref 22–32)
Calcium: 8.2 mg/dL — ABNORMAL LOW (ref 8.9–10.3)
Chloride: 102 mmol/L (ref 98–111)
Creatinine, Ser: 1.01 mg/dL (ref 0.61–1.24)
GFR, Estimated: >60 mL/min (ref >60)
Glucose, Bld: 133 mg/dL — ABNORMAL HIGH (ref 70–99)
Potassium: 3.7 mmol/L (ref 3.5–5.1)
Sodium: 136 mmol/L (ref 135–145)

## 2022-01-22 LAB — VITAMIN B1: Vitamin B1 (Thiamine): 108.8 nmol/L (ref 66.5–200.0)

## 2022-01-22 LAB — MAGNESIUM: Magnesium: 2 mg/dL (ref 1.7–2.4)

## 2022-01-22 MED ORDER — POTASSIUM CHLORIDE CRYS ER 20 MEQ PO TBCR
40.0000 meq | EXTENDED_RELEASE_TABLET | Freq: Once | ORAL | Status: AC
  Administered 2022-01-22: 40 meq via ORAL
  Filled 2022-01-22: qty 2

## 2022-01-22 NOTE — Progress Notes (Signed)
Patient is stable ?On IV antibiotics for MRSA infection and left shoulder ?Prevena wound VAC is functional ?Plan to discontinue the wound VAC on Monday ?Shoulder sling for 6 weeks ?Shoulder is reduced on exam ?Ready for transfer to skilled nursing or other rehab facility once bed is available ? ?

## 2022-01-22 NOTE — Progress Notes (Signed)
Mobility Specialist Progress Note: ? ? 01/22/22 1428  ?Mobility  ?Activity Transferred from bed to chair;Ambulated with assistance in room  ?Level of Assistance Standby assist, set-up cues, supervision of patient - no hands on  ?Assistive Device [place]  ?Distance Ambulated (ft) 10 ft  ?Activity Response Tolerated well  ?$Mobility charge 1 Mobility  ? ?Pt received in bed willing to participate in mobility. No complaints of pain. Pt left in chair with call bell in reach and all needs met. ? ?Jeremiah [name] ?Mobility Specialist ?Primary Phone [phone] ? ?

## 2022-01-22 NOTE — Progress Notes (Signed)
PROGRESS NOTE    Jeremiah [name]  AYO:[id number] DOB: 14-Oct-1951 DOA: 01/15/2022 PCP: Carol Ada, MD   Brief Narrative:  71 y.o. male with medical history significant of CAD s/p bypass in 1998,Hx of PE and DVT on Eliquis, HTN, alcohol abuse with hx of withdrawal, opioid dependency previous on Suboxone presented with acute left shoulder and left neck pain.  Patient appeared to be a limited historian.Frustrated that questions are being asked.Has not been on any of his medications.Reportedly patient lost his wife last year and moved out to [place] to live with his brother. However did not get along with his sister-in-law and has recently moved back here to [place] last week and noted to be homeless and living in [place].Noted to have multiple admissions recently in [place] for alcohol withdrawal.  In 10/2021 he was admitted following a fall and was noted to have a large left-sided chest wall hematoma  had multiple I&D and wound VAC in January due to persistent refractory hematoma, found to have chronically dislocated left shoulder arthroplasty in the setting of likely infection. In Care Everywhere 02/06/2021-CTA showed large burden of thromboembolic disease with central pulmonary artery distribution.  Saw Pulm in office with Dr Janett Billow at [facility]- who noted 'it sounds like his pulmonary embolism was associated with some lower extremity cellulitis which he was hospitalized and minimally mobile. He was treated with Eliquis, now off."  11/15/21- was discharged from [facility] a fall with chest wall hematoma/blood loss anemia chronic dislocation of left shoulder-had multiple procedures back treatment- was discharged on oral doxycycline   In the ED, hemodynamically stable afebrile Labs with anemia of 9.1 with no recent prior for  comparison.stable BMP, EKG -atrial flutter and 4-1 conduction with RBBB,BNP of 498.  Troponin reassuring at 15-->17.Left shoulder x-ray  showing anterior dislocation of left reverse total shoulder arthroplasty.Chest x-ray with clear lungs./Cardiomegaly.Further labs with ESR 44 D-dimer 1.1 TSH 1.5 COVID flu negative alcohol less than 10 UDS positive for benzo. Patient was admitted with IV vancomycin after dose of Lasix.   2/26- s/pCTA-no PE, showed cardiomegaly with reflux of contrast into the IVC and hepatic vein suggesting right heart dysfunction, pulmonary artery hypertension, thoracic aortic aneurysm up to 4.7 cm advised semiannual imaging follow-up CT or MRI and referral to cardiothoracic surgery as outpatient.Seen by orthopedic surgeon in consultation had CT of left shoulder-showing postsurgical changes and dislocation, large periprosthetic fluid collection surrounding the proximal humerus and extending throughout the left axillary region up to 7.8 X4.4X 8.8-sequelae of postoperative seroma, hematoma versus particle disease. 2/26- Echo with new onset of reduced EF-cards following. 2/27-CT-guided biopsy of left anterior axillary fluid 30 mL cloudy sent for culture.  2/28- Underwent removal of shoulder implant with excision of sinus tract, antibiotic spacer placement, and wound vac by Dr. Marlou Sa.  3/1- ID consulted, recommending protracted IV abx course.    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 Marlou Sa.  =Plan to dd/c VAC monday - Wound cultures (neg gram stains) again growing MRSA (sensitive to tetracycline, vancomycin, bactrim. - Continue IV vancomycin per ID who is attempting to arrange weekly outpatient infusions of dalbavancin (needs transportation, housing) after oritavancin load here. Anticipate at least 6 weeks IV Tx required.  - Continue oxycodone prn severe pain - Dietitian consulted to assist with optimizing nutritional status.   Homelessness: Complicates use of protracted course of IV antibiotics.  - CSW/CM consulted.  - complicating dispo   Alcohol abuse: No recent EtOH and no evidence of withdrawal currently.  - Cessation counseling provided   Acute on chronic combined HFrEF: LVEF 35-40%, similar to prior.  - GDMT limited by hypotension. Cardiology signed off 3/3 recommending lasix 20mg  daily, aspirin 81mg  daily, rosuvastatin 40mg  daily, and they will arrange post-discharge follow up.  - Appears euvolemic, metabolic panel stable.  - Monitor I/O, weights.    Atrial flutter, new diagnosis:  - Rate controlled without Tx. Avoiding AV nodal agents due to ~3 second pause noted 01/17/2022.  - Keep K > 4, Mg > 2. Replete prn.  - Not on anticoagulation due to history of hematoma from fall, ongoing alcohol abuse, homelessness, so not a candidate for rhythm control/DCCV   CAD s/p 4v CABG 1998: Nonischemic stress test Oct 2022.  - Continue ASA, statin, not on BB due to sinus pause as above.    Anemia: Suspect multifactorial due to chronic disease and possible malnutrition.  - Dietitian consulted.  - Trending slowly down, will continue checking until stable.    Thoracic aortic aneurysm: 4.cm by CTA - Repeat imaging in 6-12 months   History of PE: Completed eliquis for PE Dx March 2022. CTA negative this admission.    AKI: Resolved.    Obesity: Estimated body mass index is 27.9 kg/m as  calculated from the following:   Height as of this encounter: 5\' 7"  (1.702 m).   Weight as of this encounter: 80.8 kg.  DVT prophylaxis: SCD/Compression stockings  Code Status: full    Code Status Orders  (From admission, onward)           Start     Ordered   01/15/22 2347  Full code  Continuous        01/15/22 2347           Code Status History     Date Active Date Inactive Code Status Order ID Comments User Context   02/27/2018 1951 02/28/2018 1459 Full Code [id number]  Meredith Pel, MD Inpatient   07/23/2013 1215 07/24/2013 1824 Full Code [id number]  Babish, Lucille Passy, PA-C Inpatient      Family Communication: patient today Disposition Plan:   pending abx per ID, orhto clearance, and SNF acceptance, no safe d/c plan currently Consults called: None Admission status: Inpatient   Consultants:  Orhto cards  Procedures:  DG Chest 2 View  Result Date: 01/15/2022 CLINICAL DATA:  Fall, chest pain EXAM: CHEST - 2 VIEW COMPARISON:  03/29/2021 FINDINGS: Lungs are clear. No pneumothorax or pleural effusion. Coronary artery bypass grafting has been performed. Cardiomegaly is stable. Pulmonary vascularity is normal. Left reversed total shoulder arthroplasty has been performed with anteromedial dislocation of the humeral component. IMPRESSION: Left reversed total shoulder arthroplasty anteromedial dislocation. Stable cardiomegaly. Electronically Signed   By: Linwood Dibbles.D.  On: 01/15/2022 19:36   CT Angio Chest Pulmonary Embolism (PE) W or WO Contrast  Result Date: 01/16/2022 CLINICAL DATA:  Fall, chest pain. Pulmonary embolism (PE) suspected, positive D-dimer. History of PE EXAM: CT ANGIOGRAPHY CHEST WITH CONTRAST TECHNIQUE: Multidetector CT imaging of the chest was performed using the standard protocol during bolus administration of intravenous contrast. Multiplanar CT image reconstructions and MIPs were obtained to evaluate the vascular anatomy. RADIATION DOSE REDUCTION:  This exam was performed according to the departmental dose-optimization program which includes automated exposure control, adjustment of the mA and/or kV according to patient size and/or use of iterative reconstruction technique. CONTRAST:  35mL OMNIPAQUE IOHEXOL 350 MG/ML SOLN COMPARISON:  04/06/2021 FINDINGS: Cardiovascular: Satisfactory opacification of the pulmonary arteries to the segmental level. Evaluation of the more distal arterial branches is degraded by respiratory motion artifact. Within this limitation, there is no evidence of pulmonary embolism. Main pulmonary trunk measures 3.8 cm in diameter. Mid ascending thoracic aorta measures 4.7 cm in diameter. Scattered atherosclerotic calcifications of the aorta and coronary arteries. Cardiomegaly. Previous CABG. No pericardial effusion. Mediastinum/Nodes: No enlarged mediastinal, hilar, or axillary lymph nodes. Thyroid gland, trachea, and esophagus demonstrate no significant findings. Lungs/Pleura: Trace bilateral pleural effusions with associated bibasilar subsegmental atelectasis. Previously seen right upper lobe pulmonary nodule has resolved. No pneumothorax. Upper Abdomen: Reflux of contrast into the IVC and hepatic veins. Postsurgical changes at the gastroesophageal junction. No acute findings are evident within the included upper abdomen. Musculoskeletal: Dislocated left shoulder arthroplasty. Additional findings involving the left shoulder will be dictated on dedicated left shoulder CT. Severe osteoarthritis of the right shoulder with associated joint effusion. Degenerative changes of the thoracic spine without fracture. Review of the MIP images confirms the above findings. IMPRESSION: 1. Negative for pulmonary embolism. 2. Trace bilateral pleural effusions with associated bibasilar subsegmental atelectasis. 3. Cardiomegaly with reflux of contrast into the IVC and hepatic veins, suggesting right heart dysfunction. 4. Dilated main pulmonary trunk  suggesting pulmonary arterial hypertension. 5. Dislocated left shoulder arthroplasty with additional findings involving the left shoulder will be dictated on dedicated left shoulder CT. 6. Thoracic aortic aneurysm measuring up to 4.7 cm. Recommend semi-annual imaging followup by CTA or MRA and referral to cardiothoracic surgery if not already obtained. This recommendation follows 2010 ACCF/AHA/AATS/ACR/ASA/SCA/SCAI/SIR/STS/SVM Guidelines for the Diagnosis and Management of Patients With Thoracic Aortic Disease. Circulation. 2010; 121: X793-J030. Aortic aneurysm NOS (ICD10-I71.9) 7. Aortic and coronary artery atherosclerosis (ICD10-I70.0). Electronically Signed   By: Davina Poke D.O.   On: 01/16/2022 14:43   CT SHOULDER LEFT W CONTRAST  Result Date: 01/16/2022 CLINICAL DATA:  Dislocated left shoulder arthroplasty EXAM: CT OF THE UPPER LEFT EXTREMITY WITH CONTRAST TECHNIQUE: Multidetector CT imaging of the left shoulder was performed according to the standard protocol following intravenous contrast administration. RADIATION DOSE REDUCTION: This exam was performed according to the departmental dose-optimization program which includes automated exposure control, adjustment of the mA and/or kV according to patient size and/or use of iterative reconstruction technique. CONTRAST:  48mL OMNIPAQUE IOHEXOL 350 MG/ML SOLN COMPARISON:  X-ray 01/15/2022, 01/25/2021.  CT 02/04/2021 FINDINGS: Bones/Joint/Cartilage Postsurgical changes from previous reverse left shoulder arthroplasty. The humeral component is anteromedially displaced relative to the glenoid component. Humeral component is perched along the anteroinferior aspect of the mid clavicle. There is a component of cortical thickening and callus formation along the left clavicular diaphysis, new from 02/04/2021 (series 8, image 19) raising the possibility of subacute chronicity of dislocation. Components of the hardware appear intact. No periprosthetic lucency or  fracture is evident within the limitations of the exam which is slightly degraded by metallic streak artifact. Ligaments Suboptimally assessed by CT. Muscles and Tendons Rotator cuff muscle atrophy. Soft tissues Large periprosthetic fluid collection surrounding the proximal humerus and extending throughout the left axillary region with maximal transverse measurements of approximately 7.8 x 4.4 cm (series 11, image 41) and extending approximately 8.8 cm in craniocaudal dimension (series 13, image 55). Visualized portion of the collection has a well-defined, slightly thickened wall. IMPRESSION: 1. Postsurgical changes from previous reverse left shoulder arthroplasty. The humeral component is anteromedially displaced relative to the glenoid component. Humeral component is perched along the anteroinferior aspect of the mid clavicle. 2. There is a component of cortical thickening and callus formation along the left clavicular diaphysis, new from 02/04/2021 raising the possibility of subacute chronicity of the dislocation. 3. Large periprosthetic fluid collection surrounding the proximal humerus and extending throughout the left axillary region measuring approximately 7.8 x 4.4 x 8.8 cm. Visualized portion of the collection has a well-defined, slightly thickened wall. Findings could represent sequela of postoperative seroma, hematoma, versus particle disease. Electronically Signed   By: Davina Poke D.O.   On: 01/16/2022 14:55   DG Shoulder Left  Result Date: 01/15/2022 CLINICAL DATA:  Fall, left shoulder pain EXAM: LEFT SHOULDER - 2+ VIEW COMPARISON:  None. FINDINGS: Surgical changes of left reverse total shoulder arthroplasty are identified. There is anterior dislocation of the humeral component in relation to the glenoid component. Moderate acromioclavicular degenerative arthritis noted. No acute fracture. Limited evaluation of the left hemithorax is unremarkable. IMPRESSION: Anterior dislocation of the left  reversed total shoulder arthroplasty. Electronically Signed   By: Fidela Salisbury M.D.   On: 01/15/2022 19:35   DG Shoulder Left Port  Result Date: 01/18/2022 CLINICAL DATA:  Left shoulder arthroplasty removal EXAM: LEFT SHOULDER COMPARISON:  01/15/2022 FINDINGS: Frontal view of the left shoulder demonstrates interval removal of the left shoulder arthroplasty, with methylmethacrylate placed within the left humerus. Surgical drain identified. No acute fractures. IMPRESSION: 1. Removal of left shoulder arthroplasty with methylmethacrylate placement as above. Electronically Signed   By: Randa Ngo M.D.   On: 01/18/2022 22:27   ECHOCARDIOGRAM COMPLETE  Result Date: 01/16/2022    ECHOCARDIOGRAM REPORT   Patient Name:   ABDULMALIK DARCO Date of Exam: 01/16/2022 Medical Rec #:  [id number]       Height:       67.0 in Accession #:    [id number]      Weight:       190.0 lb Date of Birth:  1950/12/10       BSA:          1.979 m Patient Age:    70 years        BP:           110/73 mmHg Patient Gender: M               HR:           66 bpm. Exam Location:  Inpatient Procedure: 2D Echo, Cardiac Doppler, Color Doppler and Intracardiac            Opacification Agent Indications:    CHF-Acute Systolic F16.38  History:        Patient has prior history of Echocardiogram examinations, most                 recent 07/02/2021. CAD, Prior CABG, Arrythmias:RBBB,  Signs/Symptoms:Murmur; Risk Factors:Hypertension and                 Dyslipidemia. Past history of DVt and pulmonary embolism.  Sonographer:    Darlina Sicilian RDCS Referring Phys: [id number] [place] T TU  Sonographer Comments: Left arm resting across the patient chest in a sling. Unable to move left arm due to shoulder hematoma. IMPRESSIONS  1. Left ventricular ejection fraction, by estimation, is 35 to 40%. The left ventricle has moderately decreased function. The left ventricle demonstrates global hypokinesis. The left ventricular internal cavity size was mildly  dilated. There is mild left ventricular hypertrophy. Left ventricular diastolic parameters are indeterminate.  2. Right ventricular systolic function is normal. The right ventricular size is normal. There is normal pulmonary artery systolic pressure.  3. Left atrial size was moderately dilated.  4. The mitral valve is normal in structure. Mild mitral valve regurgitation. No evidence of mitral stenosis.  5. Tricuspid valve regurgitation is mild to moderate.  6. The aortic valve is tricuspid. Aortic valve regurgitation is mild. Aortic valve sclerosis is present, with no evidence of aortic valve stenosis.  7. Aortic dilatation noted. There is mild dilatation of the ascending aorta, measuring 43 mm.  8. The inferior vena cava is dilated in size with >50% respiratory variability, suggesting right atrial pressure of 8 mmHg. FINDINGS  Left Ventricle: Left ventricular ejection fraction, by estimation, is 35 to 40%. The left ventricle has moderately decreased function. The left ventricle demonstrates global hypokinesis. Definity contrast agent was given, it did not delineate the left ventricular endocardial borders. The left ventricular internal cavity size was mildly dilated. There is mild left ventricular hypertrophy. Left ventricular diastolic parameters are indeterminate. Right Ventricle: The right ventricular size is normal. Right ventricular systolic function is normal. There is normal pulmonary artery systolic pressure. The tricuspid regurgitant velocity is 2.50 m/s, and with an assumed right atrial pressure of 8 mmHg,  the estimated right ventricular systolic pressure is 85.2 mmHg. Left Atrium: Left atrial size was moderately dilated. Right Atrium: Right atrial size was normal in size. Pericardium: There is no evidence of pericardial effusion. Mitral Valve: The mitral valve is normal in structure. Mild mitral valve regurgitation. No evidence of mitral valve stenosis. Tricuspid Valve: The tricuspid valve is normal in  structure. Tricuspid valve regurgitation is mild to moderate. No evidence of tricuspid stenosis. Aortic Valve: The aortic valve is tricuspid. Aortic valve regurgitation is mild. Aortic regurgitation PHT measures 379 msec. Aortic valve sclerosis is present, with no evidence of aortic valve stenosis. Pulmonic Valve: The pulmonic valve was normal in structure. Pulmonic valve regurgitation is mild. No evidence of pulmonic stenosis. Aorta: Aortic dilatation noted. There is mild dilatation of the ascending aorta, measuring 43 mm. Venous: The inferior vena cava is dilated in size with greater than 50% respiratory variability, suggesting right atrial pressure of 8 mmHg. IAS/Shunts: No atrial level shunt detected by color flow Doppler.  LEFT VENTRICLE PLAX 2D LVIDd:         5.40 cm      Diastology LVIDs:         4.50 cm      LV e' medial:    7.09 cm/s LV PW:         1.20 cm      LV E/e' medial:  16.6 LV IVS:        1.20 cm      LV e' lateral:   9.64 cm/s LVOT diam:     2.30 cm  LV E/e' lateral: 12.2 LV SV:         65 LV SV Index:   33 LVOT Area:     4.15 cm  LV Volumes (MOD) LV vol d, MOD A2C: 221.0 ml LV vol d, MOD A4C: 167.0 ml LV vol s, MOD A2C: 122.0 ml LV vol s, MOD A4C: 111.0 ml LV SV MOD A2C:     99.0 ml LV SV MOD A4C:     167.0 ml LV SV MOD BP:      80.1 ml RIGHT VENTRICLE RV S prime:     6.83 cm/s TAPSE (M-mode): 1.3 cm LEFT ATRIUM             Index        RIGHT ATRIUM           Index LA diam:        4.70 cm 2.38 cm/m   RA Area:     20.40 cm LA Vol (A2C):   82.3 ml 41.59 ml/m  RA Volume:   60.70 ml  30.68 ml/m LA Vol (A4C):   87.6 ml 44.27 ml/m LA Biplane Vol: 86.4 ml 43.66 ml/m  AORTIC VALVE LVOT Vmax:   79.60 cm/s LVOT Vmean:  47.300 cm/s LVOT VTI:    0.156 m AI PHT:      379 msec  AORTA Ao Root diam: 3.65 cm Ao Asc diam:  4.30 cm MITRAL VALVE                TRICUSPID VALVE MV Area (PHT): 5.56 cm     TR Peak grad:   25.0 mmHg MV Decel Time: 137 msec     TR Vmax:        250.00 cm/s MV E velocity: 118.00  cm/s                             SHUNTS                             Systemic VTI:  0.16 m                             Systemic Diam: 2.30 cm Kirk Ruths MD Electronically signed by Kirk Ruths MD Signature Date/Time: 01/16/2022/11:04:44 AM    Final    [place] EKG SITE RITE  Result Date: 01/18/2022 If Site Rite image not attached, placement could not be confirmed due to current cardiac rhythm.  CT US GUIDED BIOPSY  Result Date: 01/17/2022 INDICATION: 71 year old male with history of left total shoulder arthroplasty complicated by dislocation and left axillary fluid collection. Aspiration of fluid collection is requested as operative plan is formulated. EXAM: Ultrasound-guided left axillary fluid collection aspiration MEDICATIONS: The patient is currently admitted to the hospital and receiving intravenous antibiotics. The antibiotics were administered within an appropriate time frame prior to the initiation of the procedure. ANESTHESIA/SEDATION: Moderate (conscious) sedation was employed during this procedure. A total of Versed 0 mg and Fentanyl 50 mcg was administered intravenously by the radiology nurse. Total intra-service moderate Sedation Time: 0 minutes. The patient's level of consciousness and vital signs were monitored continuously by radiology nursing throughout the procedure under my direct supervision. COMPLICATIONS: None immediate. PROCEDURE: Informed written consent was obtained from the patient after a thorough discussion of the procedural risks, benefits and alternatives. All questions were addressed. Maximal Sterile Barrier  Technique was utilized including caps, mask, sterile gowns, sterile gloves, sterile drape, hand hygiene and skin antiseptic. A timeout was performed prior to the initiation of the procedure. Preprocedure CT evaluation demonstrated a left actually fluid collection similar to comparison study which measured up to approximately 7.3 x 3.6 cm in maximum axial dimensions. CT  guidance was used to target ultrasound-guided evaluation. Preprocedure ultrasound demonstrated phlegmonous changes about the left anterior axilla with central anechoic portions which in hole abutted the indwelling left shoulder arthroplasty hardware. The procedure was planned. The left anterior shoulder was prepped and draped in standard fashion. Subdermal Local anesthesia was administered at the planned needle entry site. Under direct ultrasound visualization, a 10 cm, 5 [place] Yueh catheter was directed into the deepest aspect of the left axillary fluid collection. The needle was then removed from the outer cannula and aspiration was performed which yielded approximately 30 mL of cloudy, yellow aspirate. After the Yueh catheter was removed, ultrasound evaluation demonstrated trace residual fluid within the persistent phlegmonous structure. Hemostasis was achieved with brief manual compression and a bandage was applied. The patient tolerated procedure well was transferred back to the floor in stable condition. IMPRESSION: Technically successful CT and ultrasound guided left anterior axillary fluid collection aspiration yielding approximately 30 mL of cloudy, yellow aspirate. Sample was sent to the lab for culture. Ruthann Cancer, MD [facility] Radiology Electronically Signed   By: Ruthann Cancer M.D.   On: 01/17/2022 16:10    Antimicrobials:  vanc    Subjective: Reports pain well controlled, no acute changes overnight  Objective: Vitals:   01/21/22 1550 01/21/22 2229 01/22/22 0441 01/22/22 0841  BP: (!) 129/48 (!) 98/50 (!) 109/52 (!) 117/59  Pulse: (!) 43 85 78 77  Resp: $Remo'16 16 17 17  'UQpjV$ Temp: 97.8 F (36.6 C) 98.4 F (36.9 C) 98.3 F (36.8 C) 98.4 F (36.9 C)  TempSrc: Oral Oral Oral Oral  SpO2: 100% 98% 100% 100%  Weight:      Height:        Intake/Output Summary (Last 24 hours) at 01/22/2022 1430 Last data filed at 01/22/2022 1100 Gross per 24  hour  Intake 640 ml  Output 2035 ml  Net -1395 ml   Filed Weights   01/18/22 1453 01/19/22 0500 01/21/22 0435  Weight: 89.2 kg 89.8 kg 80.8 kg    Examination:  General exam: Appears calm and comfortable  Respiratory system: Clear to auscultation. Respiratory effort normal. Cardiovascular system: S1 & S2 heard, RRR. No JVD, murmurs, rubs, gallops or clicks. No pedal edema. Gastrointestinal system: Abdomen is nondistended, soft and nontender. No organomegaly or masses felt. Normal bowel sounds heard. Central nervous system: Alert and oriented. No focal neurological deficits. Extremities: wound vac left shoulder, no evidence of infection Skin: No rashes, lesions or ulcers Psychiatry: Judgement and insight appear normal. Mood & affect appropriate.     Data Reviewed: I have personally reviewed following labs and imaging studies  CBC: Recent Labs  Lab 01/15/22 2130 01/16/22 0530 01/18/22 2246 01/19/22 0328 01/20/22 0433 01/21/22 0105 01/22/22 0047  WBC 5.1 3.9* 10.0 9.3 6.2 8.4  --   NEUTROABS 3.4  --   --   --   --   --   --   HGB 9.1* 8.9* 9.1* 9.1* 8.6* 8.3* 7.9*  HCT 30.6* 29.8* 31.1* 30.0* 28.4* 26.9* 26.6*  MCV 88.2 87.9 88.9 87.2 87.9 86.5  --   PLT 238 252 275 309 239 301  --  Basic Metabolic Panel: Recent Labs  Lab 01/17/22 0231 01/18/22 0431 01/18/22 0822 01/18/22 2246 01/19/22 0544 01/20/22 0433 01/21/22 0105 01/22/22 0047  NA 134*  --  135  --  139 138 135 136  K 4.3  --  4.0  --  4.0 3.9 3.5 3.7  CL 107  --  106  --  104 104 104 102  CO2 23  --  24  --  $R'27 27 23 23  'ks$ GLUCOSE 79  --  138*  --  133* 117* 103* 133*  BUN 29*  --  27*  --  23 24* 40* 46*  CREATININE 1.28*   < > 0.89 0.91 0.90 0.89 1.10 1.01  CALCIUM 7.9*  --  7.9*  --  7.8* 8.0* 8.0* 8.2*  MG 1.9  --  1.7  --   --  1.5* 1.7 2.0   < > = values in this interval not displayed.   GFR: Estimated Creatinine Clearance: 70.3 mL/min (by C-G formula based on SCr of 1.01 mg/dL). Liver  Function Tests: Recent Labs  Lab 01/15/22 2130 01/17/22 0231  AST 22  --   ALT 16  --   ALKPHOS 95  --   BILITOT 0.9  --   PROT 6.1*  --   ALBUMIN 2.8* 2.3*   No results for input(s): LIPASE, AMYLASE in the last 168 hours. No results for input(s): AMMONIA in the last 168 hours. Coagulation Profile: No results for input(s): INR, PROTIME in the last 168 hours. Cardiac Enzymes: No results for input(s): CKTOTAL, CKMB, CKMBINDEX, TROPONINI in the last 168 hours. BNP (last 3 results) No results for input(s): PROBNP in the last 8760 hours. HbA1C: No results for input(s): HGBA1C in the last 72 hours. CBG: No results for input(s): GLUCAP in the last 168 hours. Lipid Profile: No results for input(s): CHOL, HDL, LDLCALC, TRIG, CHOLHDL, LDLDIRECT in the last 72 hours. Thyroid Function Tests: No results for input(s): TSH, T4TOTAL, FREET4, T3FREE, THYROIDAB in the last 72 hours. Anemia Panel: No results for input(s): VITAMINB12, FOLATE, FERRITIN, TIBC, IRON, RETICCTPCT in the last 72 hours. Sepsis Labs: No results for input(s): PROCALCITON, LATICACIDVEN in the last 168 hours.  Recent Results (from the past 240 hour(s))  Resp Panel by RT-PCR (Flu A&B, Covid) Nasopharyngeal Swab     Status: None   Collection Time: 01/15/22 11:38 PM   Specimen: Nasopharyngeal Swab; Nasopharyngeal(NP) swabs in vial transport medium  Result Value Ref Range Status   SARS Coronavirus 2 by RT PCR NEGATIVE NEGATIVE Final    Comment: (NOTE) SARS-CoV-2 target nucleic acids are NOT DETECTED.  The SARS-CoV-2 RNA is generally detectable in upper respiratory specimens during the acute phase of infection. The lowest concentration of SARS-CoV-2 viral copies this assay can detect is 138 copies/mL. A negative result does not preclude SARS-Cov-2 infection and should not be used as the sole basis for treatment or other patient management decisions. A negative result may occur with  improper specimen collection/handling,  submission of specimen other than nasopharyngeal swab, presence of viral mutation(s) within the areas targeted by this assay, and inadequate number of viral copies(<138 copies/mL). A negative result must be combined with clinical observations, patient history, and epidemiological information. The expected result is Negative.  Fact Sheet for Patients:  EntrepreneurPulse.com.au  Fact Sheet for Healthcare Providers:  IncredibleEmployment.be  This test is no t yet approved or cleared by the [place] FDA and  has been authorized for detection and/or diagnosis of SARS-CoV-2 by FDA under  an Emergency Use Authorization (EUA). This EUA will remain  in effect (meaning this test can be used) for the duration of the COVID-19 declaration under Section 564(b)(1) of the Act, 21 U.S.C.section 360bbb-3(b)(1), unless the authorization is terminated  or revoked sooner.       Influenza A by PCR NEGATIVE NEGATIVE Final   Influenza B by PCR NEGATIVE NEGATIVE Final    Comment: (NOTE) The Xpert Xpress SARS-CoV-2/FLU/RSV plus assay is intended as an aid in the diagnosis of influenza from Nasopharyngeal swab specimens and should not be used as a sole basis for treatment. Nasal washings and aspirates are unacceptable for Xpert Xpress SARS-CoV-2/FLU/RSV testing.  Fact Sheet for Patients: EntrepreneurPulse.com.au  Fact Sheet for Healthcare Providers: IncredibleEmployment.be  This test is not yet approved or cleared by the [place] FDA and has been authorized for detection and/or diagnosis of SARS-CoV-2 by FDA under an Emergency Use Authorization (EUA). This EUA will remain in effect (meaning this test can be used) for the duration of the COVID-19 declaration under Section 564(b)(1) of the Act, 21 U.S.C. section 360bbb-3(b)(1), unless the authorization is terminated or revoked.  Performed at [facility], [place] [address], [place], [place] [address]   MRSA Next Gen by PCR, Nasal     Status: Abnormal   Collection Time: 01/16/22 12:47 AM   Specimen: Nasal Mucosa; Nasal Swab  Result Value Ref Range Status   MRSA by PCR Next Gen DETECTED (A) NOT DETECTED Final    Comment: (NOTE) The GeneXpert MRSA Assay (FDA approved for NASAL specimens only), is one component of a comprehensive MRSA colonization surveillance program. It is not intended to diagnose MRSA infection nor to guide or monitor treatment for MRSA infections. Test performance is not FDA approved in patients less than 47 years old. Performed at [facility], [place] [address] [place], [place] [address]   Aerobic/Anaerobic Culture w Gram Stain (surgical/deep wound)     Status: None (Preliminary result)   Collection Time: 01/17/22  2:52 PM   Specimen: Abscess  Result Value Ref Range Status   Specimen Description   Final    ABSCESS ABSCESS Performed at [place] [address], [place], [place] [address]    Special Requests   Final    LEFT Performed at [facility], [place] [address] [place], [place] [address]    Gram Stain   Final    NO SQUAMOUS EPITHELIAL CELLS SEEN FEW WBC SEEN NO ORGANISMS SEEN Performed at [facility], [place] [address] [place], [place] [address]    Culture   Final    RARE METHICILLIN RESISTANT STAPHYLOCOCCUS AUREUS NO ANAEROBES ISOLATED; CULTURE IN PROGRESS FOR 5 DAYS    Report Status PENDING  Incomplete   Organism ID, Bacteria METHICILLIN RESISTANT STAPHYLOCOCCUS AUREUS  Final      Susceptibility   Methicillin resistant staphylococcus aureus - MIC*    CIPROFLOXACIN >=8 RESISTANT Resistant     ERYTHROMYCIN >=8 RESISTANT Resistant     GENTAMICIN <=0.5 SENSITIVE Sensitive     OXACILLIN >=4 RESISTANT Resistant     TETRACYCLINE <=1 SENSITIVE Sensitive     VANCOMYCIN 1 SENSITIVE Sensitive     TRIMETH/SULFA <=10 SENSITIVE Sensitive      CLINDAMYCIN >=8 RESISTANT Resistant     RIFAMPIN <=0.5 SENSITIVE Sensitive     Inducible Clindamycin NEGATIVE Sensitive     * RARE METHICILLIN RESISTANT STAPHYLOCOCCUS AUREUS  Aerobic/Anaerobic Culture w Gram Stain (surgical/deep wound)  Status: None (Preliminary result)   Collection Time: 01/18/22  6:41 PM   Specimen: PATH Other; Tissue  Result Value Ref Range Status   Specimen Description SHOULDER JOINT FLUID  Final   Special Requests SWAB A  Final   Gram Stain   Final    NO SQUAMOUS EPITHELIAL CELLS SEEN FEW WBC SEEN NO ORGANISMS SEEN    Culture   Final    NO GROWTH 4 DAYS NO ANAEROBES ISOLATED; CULTURE IN PROGRESS FOR 5 DAYS Performed at [facility], [place] [address] [place], [place] [address]    Report Status PENDING  Incomplete  Aerobic/Anaerobic Culture w Gram Stain (surgical/deep wound)     Status: None (Preliminary result)   Collection Time: 01/18/22  6:46 PM   Specimen: PATH Other; Tissue  Result Value Ref Range Status   Specimen Description SHOULDER LEFT JOINT FLUID  Final   Special Requests SWAB B  Final   Gram Stain   Final    NO SQUAMOUS EPITHELIAL CELLS SEEN FEW WBC SEEN NO ORGANISMS SEEN    Culture   Final    NO GROWTH 4 DAYS NO ANAEROBES ISOLATED; CULTURE IN PROGRESS FOR 5 DAYS Performed at [facility], [place] [address], [place], [place] [address]    Report Status PENDING  Incomplete  Aerobic/Anaerobic Culture w Gram Stain (surgical/deep wound)     Status: None (Preliminary result)   Collection Time: 01/18/22  6:46 PM   Specimen: PATH Other; Tissue  Result Value Ref Range Status   Specimen Description SHOULDER LEFT JOINT FLUID  Final   Special Requests SWAB C  Final   Gram Stain   Final    NO SQUAMOUS EPITHELIAL CELLS SEEN FEW WBC SEEN NO ORGANISMS SEEN    Culture   Final    NO GROWTH 4 DAYS NO ANAEROBES ISOLATED; CULTURE IN PROGRESS FOR 5 DAYS Performed at [facility], [place] [address], [place], [place] [address]    Report  Status PENDING  Incomplete         Radiology Studies: No results found.      Scheduled Meds:  allopurinol  100 mg Oral Daily   vitamin C  500 mg Oral Daily   aspirin EC  81 mg Oral Daily   buPROPion  300 mg Oral q morning   busPIRone  15 mg Oral TID   cholecalciferol  1,000 Units Oral Daily   docusate sodium  100 mg Oral BID   enoxaparin (LOVENOX) injection  40 mg Subcutaneous Q24H   feeding supplement  237 mL Oral BID BM   furosemide  20 mg Oral Daily   gabapentin  300 mg Oral BID   hydrOXYzine  25 mg Oral TID   melatonin  3 mg Oral QHS   multivitamin with minerals  1 tablet Oral Daily   nutrition supplement (JUVEN)  1 packet Oral BID BM   Ensure Max Protein  11 oz Oral Daily   QUEtiapine  50 mg Oral QHS   rosuvastatin  40 mg Oral Daily   vitamin B-12  1,000 mcg Oral Daily   zinc sulfate  220 mg Oral Daily   Continuous Infusions:  sodium chloride 10 mL/hr at 01/18/22 2302   lactated ringers 10 mL/hr at 01/18/22 1456   vancomycin 750 mg (01/22/22 1202)     LOS: 7 days    Time spent: 42 min    Nicolette Bang, MD [facility]  If 7PM-7AM, please contact night-coverage  01/22/2022, 2:30  PM

## 2022-01-23 DIAGNOSIS — F1010 Alcohol abuse, uncomplicated: Secondary | ICD-10-CM | POA: Diagnosis not present

## 2022-01-23 DIAGNOSIS — I5043 Acute on chronic combined systolic (congestive) and diastolic (congestive) heart failure: Secondary | ICD-10-CM | POA: Diagnosis not present

## 2022-01-23 DIAGNOSIS — I483 Typical atrial flutter: Secondary | ICD-10-CM | POA: Diagnosis not present

## 2022-01-23 DIAGNOSIS — S41002A Unspecified open wound of left shoulder, initial encounter: Secondary | ICD-10-CM | POA: Diagnosis not present

## 2022-01-23 LAB — AEROBIC/ANAEROBIC CULTURE W GRAM STAIN (SURGICAL/DEEP WOUND): Gram Stain: NONE SEEN

## 2022-01-23 LAB — BASIC METABOLIC PANEL
Anion gap: 8 (ref 5–15)
BUN: 42 mg/dL — ABNORMAL HIGH (ref 8–23)
CO2: 23 mmol/L (ref 22–32)
Calcium: 8.4 mg/dL — ABNORMAL LOW (ref 8.9–10.3)
Chloride: 103 mmol/L (ref 98–111)
Creatinine, Ser: 0.99 mg/dL (ref 0.61–1.24)
GFR, Estimated: >60 mL/min (ref >60)
Glucose, Bld: 145 mg/dL — ABNORMAL HIGH (ref 70–99)
Potassium: 4.3 mmol/L (ref 3.5–5.1)
Sodium: 134 mmol/L — ABNORMAL LOW (ref 135–145)

## 2022-01-23 LAB — VITAMIN C: Vitamin C: 0.6 mg/dL (ref 0.4–2.0)

## 2022-01-23 LAB — MAGNESIUM: Magnesium: 1.9 mg/dL (ref 1.7–2.4)

## 2022-01-23 LAB — VITAMIN B6: Vitamin B6: 2 ug/L — ABNORMAL LOW (ref 3.4–65.2)

## 2022-01-23 MED ORDER — MAGNESIUM SULFATE IN D5W 1-5 GM/100ML-% IV SOLN
1.0000 g | Freq: Once | INTRAVENOUS | Status: AC
  Administered 2022-01-23: 1 g via INTRAVENOUS
  Filled 2022-01-23: qty 100

## 2022-01-23 NOTE — Progress Notes (Signed)
PROGRESS NOTE    LADARIEN [name]  JQZ:[id number] DOB: Nov 14, 1951 DOA: 01/15/2022 PCP: Carol Ada, MD   Brief Narrative:  71 y.o. male with medical history significant of CAD s/p bypass in 1998,Hx of PE and DVT on Eliquis, HTN, alcohol abuse with hx of withdrawal, opioid dependency previous on Suboxone presented with acute left shoulder and left neck pain.  Patient appeared to be a limited historian.Frustrated that questions are being asked.Has not been on any of his medications.Reportedly patient lost his wife last year and moved out to [place] to live with his brother. However did not get along with his sister-in-law and has recently moved back here to [place] last week and noted to be homeless and living in [place].Noted to have multiple admissions recently in [place] for alcohol withdrawal.  In 10/2021 he was admitted following a fall and was noted to have a large left-sided chest wall hematoma  had multiple I&D and wound VAC in January due to persistent refractory hematoma, found to have chronically dislocated left shoulder arthroplasty in the setting of likely infection. In Care Everywhere 02/06/2021-CTA showed large burden of thromboembolic disease with central pulmonary artery distribution.  Saw Pulm in office with Dr Janett Billow at [facility]- who noted 'it sounds like his pulmonary embolism was associated with some lower extremity cellulitis which he was hospitalized and minimally mobile. He was treated with Eliquis, now off."  11/15/21- was discharged from [facility] a fall with chest wall hematoma/blood loss anemia chronic dislocation of left shoulder-had multiple procedures back treatment- was discharged on oral doxycycline   In the ED, hemodynamically stable afebrile Labs with anemia of 9.1 with no recent prior for  comparison.stable BMP, EKG -atrial flutter and 4-1 conduction with RBBB,BNP of 498.  Troponin reassuring at 15-->17.Left shoulder x-ray  showing anterior dislocation of left reverse total shoulder arthroplasty.Chest x-ray with clear lungs./Cardiomegaly.Further labs with ESR 44 D-dimer 1.1 TSH 1.5 COVID flu negative alcohol less than 10 UDS positive for benzo. Patient was admitted with IV vancomycin after dose of Lasix.   2/26- s/pCTA-no PE, showed cardiomegaly with reflux of contrast into the IVC and hepatic vein suggesting right heart dysfunction, pulmonary artery hypertension, thoracic aortic aneurysm up to 4.7 cm advised semiannual imaging follow-up CT or MRI and referral to cardiothoracic surgery as outpatient.Seen by orthopedic surgeon in consultation had CT of left shoulder-showing postsurgical changes and dislocation, large periprosthetic fluid collection surrounding the proximal humerus and extending throughout the left axillary region up to 7.8 X4.4X 8.8-sequelae of postoperative seroma, hematoma versus particle disease. 2/26- Echo with new onset of reduced EF-cards following. 2/27-CT-guided biopsy of left anterior axillary fluid 30 mL cloudy sent for culture.  2/28- Underwent removal of shoulder implant with excision of sinus tract, antibiotic spacer placement, and wound vac by Dr. Marlou Sa.  3/1- ID consulted, recommending protracted IV abx course  3/3-PT IS A CHALLENGING DISPO 2/2 HOMELESS STATUS AND NEED FOR LT ABX. ID EVALUATING FOR OUTPT DALBAVANCIN WEEKLY INFUSIONS   Assessment & Plan:   Principal Problem:   Shoulder wound, left, initial encounter Active Problems:   Acute on chronic combined systolic and diastolic CHF (congestive heart failure) (HCC)   Atrial flutter (HCC)   Shoulder dislocation, recurrent, left   Prolonged QT interval   Normocytic anemia   Alcohol abuse   Elevated d-dimer   Depression   History of pulmonary embolism   B12 deficiency   Hypotension   Left shoulder pain   Thoracic aortic aneurysm (TAA)  CAD (coronary artery disease)   Protein-calorie malnutrition, severe   Infection of  prosthetic shoulder joint ([place])   Prosthetic left shoulder septic arthritis, history of wound culture growing MRSA Dec 2022: s/p sinus tract excision, washout, abx spacer and wound vac placement 2/28 by Dr. Marlou Sa.  -Wound cultures (neg gram stains) again growing MRSA (sensitive to tetracycline, vancomycin, bactrim. - Continue IV vancomycin per ID who is attempting to arrange weekly outpatient infusions of dalbavancin (needs transportation, housing) after oritavancin load here. Anticipate at least 6 weeks IV Tx required.  - Continue oxycodone prn severe pain - Dietitian consulted to assist with optimizing nutritional status.   Homelessness: Complicates use of protracted course of IV antibiotics.  - CSW/CM consulted.  - complicating dispo   Alcohol abuse: No recent EtOH and no evidence of withdrawal currently.  - Cessation counseling provided   Acute on chronic combined HFrEF: LVEF 35-40%, similar to prior.  - GDMT limited by hypotension. Cardiology signed off 3/3 recommending lasix 39m daily, aspirin 84mdaily, rosuvastatin 4040maily, and they will arrange post-discharge follow up.  - Appears euvolemic, metabolic panel stable.  - Monitor I/O, weights.    Atrial flutter, new diagnosis:  - Rate controlled without Tx. Avoiding AV nodal agents due to ~3 second pause noted 01/17/2022.  - Keep K > 4, Mg > 2. Replete prn.  - Not on anticoagulation due to history of hematoma from fall, ongoing alcohol abuse, homelessness, so not a candidate for rhythm control/DCCV   CAD s/p 4v CABG 1998: Nonischemic stress test Oct 2022.  - Continue ASA, statin, not on BB due to sinus pause as above.    Anemia: Suspect multifactorial due to chronic disease and possible malnutrition.  - Dietitian consulted.  - Trending slowly down, will continue checking until stable.    Thoracic aortic aneurysm: 4.cm by CTA - Repeat imaging in 6-12 months   History of PE: Completed eliquis for PE Dx March 2022. CTA  negative this admission.    AKI: Resolved.    Obesity: Estimated body mass index is 27.9 kg/m as calculated from the following:   Height as of this encounter: 5' 7"  (1.702 m).   Weight as of this encounter: 80.8 kg.  DVT prophylaxis: SCD/Compression stockings  Code Status: FULL    Code Status Orders  (From admission, onward)           Start     Ordered   01/15/22 2347  Full code  Continuous        01/15/22 2347           Code Status History     Date Active Date Inactive Code Status Order ID Comments User Context   02/27/2018 1951 02/28/2018 1459 Full Code [id number] PelD Inpatient   07/23/2013 1215 07/24/2013 1824 Full Code [id number], MatLucille PassyA-C Inpatient      Family Communication: PATIENT TODAY  Disposition Plan:   PENDING ARRANGEMENT FOR ABX AND HOSUING, PT IS CURRENTLY HOMELESS AND THERE IS NO SAFE D/C PLAN Consults called: None Admission status: Inpatient   Consultants:  ORTHO, CARDS  Procedures:  DG Chest 2 View  Result Date: 01/15/2022 CLINICAL DATA:  Fall, chest pain EXAM: CHEST - 2 VIEW COMPARISON:  03/29/2021 FINDINGS: Lungs are clear. No pneumothorax or pleural effusion. Coronary artery bypass grafting has been performed. Cardiomegaly is stable. Pulmonary vascularity is normal. Left reversed total shoulder arthroplasty has been performed with anteromedial dislocation of the humeral component. IMPRESSION: Left  reversed total shoulder arthroplasty anteromedial dislocation. Stable cardiomegaly. Electronically Signed   By: Fidela Salisbury M.D.   On: 01/15/2022 19:36   CT Angio Chest Pulmonary Embolism (PE) W or WO Contrast  Result Date: 01/16/2022 CLINICAL DATA:  Fall, chest pain. Pulmonary embolism (PE) suspected, positive D-dimer. History of PE EXAM: CT ANGIOGRAPHY CHEST WITH CONTRAST TECHNIQUE: Multidetector CT imaging of the chest was performed using the standard protocol during bolus administration of intravenous contrast.  Multiplanar CT image reconstructions and MIPs were obtained to evaluate the vascular anatomy. RADIATION DOSE REDUCTION: This exam was performed according to the departmental dose-optimization program which includes automated exposure control, adjustment of the mA and/or kV according to patient size and/or use of iterative reconstruction technique. CONTRAST:  14m OMNIPAQUE IOHEXOL 350 MG/ML SOLN COMPARISON:  04/06/2021 FINDINGS: Cardiovascular: Satisfactory opacification of the pulmonary arteries to the segmental level. Evaluation of the more distal arterial branches is degraded by respiratory motion artifact. Within this limitation, there is no evidence of pulmonary embolism. Main pulmonary trunk measures 3.8 cm in diameter. Mid ascending thoracic aorta measures 4.7 cm in diameter. Scattered atherosclerotic calcifications of the aorta and coronary arteries. Cardiomegaly. Previous CABG. No pericardial effusion. Mediastinum/Nodes: No enlarged mediastinal, hilar, or axillary lymph nodes. Thyroid gland, trachea, and esophagus demonstrate no significant findings. Lungs/Pleura: Trace bilateral pleural effusions with associated bibasilar subsegmental atelectasis. Previously seen right upper lobe pulmonary nodule has resolved. No pneumothorax. Upper Abdomen: Reflux of contrast into the IVC and hepatic veins. Postsurgical changes at the gastroesophageal junction. No acute findings are evident within the included upper abdomen. Musculoskeletal: Dislocated left shoulder arthroplasty. Additional findings involving the left shoulder will be dictated on dedicated left shoulder CT. Severe osteoarthritis of the right shoulder with associated joint effusion. Degenerative changes of the thoracic spine without fracture. Review of the MIP images confirms the above findings. IMPRESSION: 1. Negative for pulmonary embolism. 2. Trace bilateral pleural effusions with associated bibasilar subsegmental atelectasis. 3. Cardiomegaly with  reflux of contrast into the IVC and hepatic veins, suggesting right heart dysfunction. 4. Dilated main pulmonary trunk suggesting pulmonary arterial hypertension. 5. Dislocated left shoulder arthroplasty with additional findings involving the left shoulder will be dictated on dedicated left shoulder CT. 6. Thoracic aortic aneurysm measuring up to 4.7 cm. Recommend semi-annual imaging followup by CTA or MRA and referral to cardiothoracic surgery if not already obtained. This recommendation follows 2010 ACCF/AHA/AATS/ACR/ASA/SCA/SCAI/SIR/STS/SVM Guidelines for the Diagnosis and Management of Patients With Thoracic Aortic Disease. Circulation. 2010; 121:: F621-H086 Aortic aneurysm NOS (ICD10-I71.9) 7. Aortic and coronary artery atherosclerosis (ICD10-I70.0). Electronically Signed   By: NDavina PokeD.O.   On: 01/16/2022 14:43   CT SHOULDER LEFT W CONTRAST  Result Date: 01/16/2022 CLINICAL DATA:  Dislocated left shoulder arthroplasty EXAM: CT OF THE UPPER LEFT EXTREMITY WITH CONTRAST TECHNIQUE: Multidetector CT imaging of the left shoulder was performed according to the standard protocol following intravenous contrast administration. RADIATION DOSE REDUCTION: This exam was performed according to the departmental dose-optimization program which includes automated exposure control, adjustment of the mA and/or kV according to patient size and/or use of iterative reconstruction technique. CONTRAST:  76mOMNIPAQUE IOHEXOL 350 MG/ML SOLN COMPARISON:  X-ray 01/15/2022, 01/25/2021.  CT 02/04/2021 FINDINGS: Bones/Joint/Cartilage Postsurgical changes from previous reverse left shoulder arthroplasty. The humeral component is anteromedially displaced relative to the glenoid component. Humeral component is perched along the anteroinferior aspect of the mid clavicle. There is a component of cortical thickening and callus formation along the left clavicular diaphysis, new from 02/04/2021 (series 8, image  19) raising the  possibility of subacute chronicity of dislocation. Components of the hardware appear intact. No periprosthetic lucency or fracture is evident within the limitations of the exam which is slightly degraded by metallic streak artifact. Ligaments Suboptimally assessed by CT. Muscles and Tendons Rotator cuff muscle atrophy. Soft tissues Large periprosthetic fluid collection surrounding the proximal humerus and extending throughout the left axillary region with maximal transverse measurements of approximately 7.8 x 4.4 cm (series 11, image 41) and extending approximately 8.8 cm in craniocaudal dimension (series 13, image 55). Visualized portion of the collection has a well-defined, slightly thickened wall. IMPRESSION: 1. Postsurgical changes from previous reverse left shoulder arthroplasty. The humeral component is anteromedially displaced relative to the glenoid component. Humeral component is perched along the anteroinferior aspect of the mid clavicle. 2. There is a component of cortical thickening and callus formation along the left clavicular diaphysis, new from 02/04/2021 raising the possibility of subacute chronicity of the dislocation. 3. Large periprosthetic fluid collection surrounding the proximal humerus and extending throughout the left axillary region measuring approximately 7.8 x 4.4 x 8.8 cm. Visualized portion of the collection has a well-defined, slightly thickened wall. Findings could represent sequela of postoperative seroma, hematoma, versus particle disease. Electronically Signed   By: Davina Poke D.O.   On: 01/16/2022 14:55   DG Shoulder Left  Result Date: 01/15/2022 CLINICAL DATA:  Fall, left shoulder pain EXAM: LEFT SHOULDER - 2+ VIEW COMPARISON:  None. FINDINGS: Surgical changes of left reverse total shoulder arthroplasty are identified. There is anterior dislocation of the humeral component in relation to the glenoid component. Moderate acromioclavicular degenerative arthritis noted. No  acute fracture. Limited evaluation of the left hemithorax is unremarkable. IMPRESSION: Anterior dislocation of the left reversed total shoulder arthroplasty. Electronically Signed   By: Fidela Salisbury M.D.   On: 01/15/2022 19:35   DG Shoulder Left Port  Result Date: 01/18/2022 CLINICAL DATA:  Left shoulder arthroplasty removal EXAM: LEFT SHOULDER COMPARISON:  01/15/2022 FINDINGS: Frontal view of the left shoulder demonstrates interval removal of the left shoulder arthroplasty, with methylmethacrylate placed within the left humerus. Surgical drain identified. No acute fractures. IMPRESSION: 1. Removal of left shoulder arthroplasty with methylmethacrylate placement as above. Electronically Signed   By: Randa Ngo M.D.   On: 01/18/2022 22:27   ECHOCARDIOGRAM COMPLETE  Result Date: 01/16/2022    ECHOCARDIOGRAM REPORT   Patient Name:   PAULANTHONY GLEAVES Date of Exam: 01/16/2022 Medical Rec #:  [id number]       Height:       67.0 in Accession #:    [id number]      Weight:       190.0 lb Date of Birth:  [birth year]       BSA:          1.979 m Patient Age:    56 years        BP:           110/73 mmHg Patient Gender: M               HR:           66 bpm. Exam Location:  Inpatient Procedure: 2D Echo, Cardiac Doppler, Color Doppler and Intracardiac            Opacification Agent Indications:    CHF-Acute Systolic W29.56  History:        Patient has prior history of Echocardiogram examinations, most  recent 07/02/2021. CAD, Prior CABG, Arrythmias:RBBB,                 Signs/Symptoms:Murmur; Risk Factors:Hypertension and                 Dyslipidemia. Past history of DVt and pulmonary embolism.  Sonographer:    Darlina Sicilian RDCS Referring Phys: [id number] Elizabeth T TU  Sonographer Comments: Left arm resting across the patient chest in a sling. Unable to move left arm due to shoulder hematoma. IMPRESSIONS  1. Left ventricular ejection fraction, by estimation, is 35 to 40%. The left ventricle has moderately  decreased function. The left ventricle demonstrates global hypokinesis. The left ventricular internal cavity size was mildly dilated. There is mild left ventricular hypertrophy. Left ventricular diastolic parameters are indeterminate.  2. Right ventricular systolic function is normal. The right ventricular size is normal. There is normal pulmonary artery systolic pressure.  3. Left atrial size was moderately dilated.  4. The mitral valve is normal in structure. Mild mitral valve regurgitation. No evidence of mitral stenosis.  5. Tricuspid valve regurgitation is mild to moderate.  6. The aortic valve is tricuspid. Aortic valve regurgitation is mild. Aortic valve sclerosis is present, with no evidence of aortic valve stenosis.  7. Aortic dilatation noted. There is mild dilatation of the ascending aorta, measuring 43 mm.  8. The inferior vena cava is dilated in size with >50% respiratory variability, suggesting right atrial pressure of 8 mmHg. FINDINGS  Left Ventricle: Left ventricular ejection fraction, by estimation, is 35 to 40%. The left ventricle has moderately decreased function. The left ventricle demonstrates global hypokinesis. Definity contrast agent was given, it did not delineate the left ventricular endocardial borders. The left ventricular internal cavity size was mildly dilated. There is mild left ventricular hypertrophy. Left ventricular diastolic parameters are indeterminate. Right Ventricle: The right ventricular size is normal. Right ventricular systolic function is normal. There is normal pulmonary artery systolic pressure. The tricuspid regurgitant velocity is 2.50 m/s, and with an assumed right atrial pressure of 8 mmHg,  the estimated right ventricular systolic pressure is 00.9 mmHg. Left Atrium: Left atrial size was moderately dilated. Right Atrium: Right atrial size was normal in size. Pericardium: There is no evidence of pericardial effusion. Mitral Valve: The mitral valve is normal in  structure. Mild mitral valve regurgitation. No evidence of mitral valve stenosis. Tricuspid Valve: The tricuspid valve is normal in structure. Tricuspid valve regurgitation is mild to moderate. No evidence of tricuspid stenosis. Aortic Valve: The aortic valve is tricuspid. Aortic valve regurgitation is mild. Aortic regurgitation PHT measures 379 msec. Aortic valve sclerosis is present, with no evidence of aortic valve stenosis. Pulmonic Valve: The pulmonic valve was normal in structure. Pulmonic valve regurgitation is mild. No evidence of pulmonic stenosis. Aorta: Aortic dilatation noted. There is mild dilatation of the ascending aorta, measuring 43 mm. Venous: The inferior vena cava is dilated in size with greater than 50% respiratory variability, suggesting right atrial pressure of 8 mmHg. IAS/Shunts: No atrial level shunt detected by color flow Doppler.  LEFT VENTRICLE PLAX 2D LVIDd:         5.40 cm      Diastology LVIDs:         4.50 cm      LV e' medial:    7.09 cm/s LV PW:         1.20 cm      LV E/e' medial:  16.6 LV IVS:  1.20 cm      LV e' lateral:   9.64 cm/s LVOT diam:     2.30 cm      LV E/e' lateral: 12.2 LV SV:         65 LV SV Index:   33 LVOT Area:     4.15 cm  LV Volumes (MOD) LV vol d, MOD A2C: 221.0 ml LV vol d, MOD A4C: 167.0 ml LV vol s, MOD A2C: 122.0 ml LV vol s, MOD A4C: 111.0 ml LV SV MOD A2C:     99.0 ml LV SV MOD A4C:     167.0 ml LV SV MOD BP:      80.1 ml RIGHT VENTRICLE RV S prime:     6.83 cm/s TAPSE (M-mode): 1.3 cm LEFT ATRIUM             Index        RIGHT ATRIUM           Index LA diam:        4.70 cm 2.38 cm/m   RA Area:     20.40 cm LA Vol (A2C):   82.3 ml 41.59 ml/m  RA Volume:   60.70 ml  30.68 ml/m LA Vol (A4C):   87.6 ml 44.27 ml/m LA Biplane Vol: 86.4 ml 43.66 ml/m  AORTIC VALVE LVOT Vmax:   79.60 cm/s LVOT Vmean:  47.300 cm/s LVOT VTI:    0.156 m AI PHT:      379 msec  AORTA Ao Root diam: 3.65 cm Ao Asc diam:  4.30 cm MITRAL VALVE                TRICUSPID VALVE  MV Area (PHT): 5.56 cm     TR Peak grad:   25.0 mmHg MV Decel Time: 137 msec     TR Vmax:        250.00 cm/s MV E velocity: 118.00 cm/s                             SHUNTS                             Systemic VTI:  0.16 m                             Systemic Diam: 2.30 cm Kirk Ruths MD Electronically signed by Kirk Ruths MD Signature Date/Time: 01/16/2022/11:04:44 AM    Final    [place] EKG SITE RITE  Result Date: 01/18/2022 If Site Rite image not attached, placement could not be confirmed due to current cardiac rhythm.  CT US GUIDED BIOPSY  Result Date: 01/17/2022 INDICATION: 71 year old male with history of left total shoulder arthroplasty complicated by dislocation and left axillary fluid collection. Aspiration of fluid collection is requested as operative plan is formulated. EXAM: Ultrasound-guided left axillary fluid collection aspiration MEDICATIONS: The patient is currently admitted to the hospital and receiving intravenous antibiotics. The antibiotics were administered within an appropriate time frame prior to the initiation of the procedure. ANESTHESIA/SEDATION: Moderate (conscious) sedation was employed during this procedure. A total of Versed 0 mg and Fentanyl 50 mcg was administered intravenously by the radiology nurse. Total intra-service moderate Sedation Time: 0 minutes. The patient's level of consciousness and vital signs were monitored continuously by radiology nursing throughout the procedure under my direct supervision. COMPLICATIONS: None immediate.  PROCEDURE: Informed written consent was obtained from the patient after a thorough discussion of the procedural risks, benefits and alternatives. All questions were addressed. Maximal Sterile Barrier Technique was utilized including caps, mask, sterile gowns, sterile gloves, sterile drape, hand hygiene and skin antiseptic. A timeout was performed prior to the initiation of the procedure. Preprocedure CT evaluation demonstrated a left  actually fluid collection similar to comparison study which measured up to approximately 7.3 x 3.6 cm in maximum axial dimensions. CT guidance was used to target ultrasound-guided evaluation. Preprocedure ultrasound demonstrated phlegmonous changes about the left anterior axilla with central anechoic portions which in hole abutted the indwelling left shoulder arthroplasty hardware. The procedure was planned. The left anterior shoulder was prepped and draped in standard fashion. Subdermal Local anesthesia was administered at the planned needle entry site. Under direct ultrasound visualization, a 10 cm, 5 [place] Yueh catheter was directed into the deepest aspect of the left axillary fluid collection. The needle was then removed from the outer cannula and aspiration was performed which yielded approximately 30 mL of cloudy, yellow aspirate. After the Yueh catheter was removed, ultrasound evaluation demonstrated trace residual fluid within the persistent phlegmonous structure. Hemostasis was achieved with brief manual compression and a bandage was applied. The patient tolerated procedure well was transferred back to the floor in stable condition. IMPRESSION: Technically successful CT and ultrasound guided left anterior axillary fluid collection aspiration yielding approximately 30 mL of cloudy, yellow aspirate. Sample was sent to the lab for culture. Ruthann Cancer, MD [facility] Electronically Signed   By: Ruthann Cancer M.D.   On: 01/17/2022 16:10    Antimicrobials:  VANC    Subjective: Reports shoulder pain Otherwise resting comfortably in bed, appreciative of care being provided  Objective: Vitals:   01/22/22 2052 01/23/22 0500 01/23/22 0510 01/23/22 0800  BP: 140/78  109/63 103/75  Pulse: 78  61 66  Resp: 20  18 18   Temp: 98.3 F (36.8 C)  98.1 F (36.7 C) 98.1 F (36.7 C)  TempSrc: Oral  Oral Oral  SpO2: 100%  99% 99%  Weight:  86.4 kg     Height:        Intake/Output Summary (Last 24 hours) at 01/23/2022 1425 Last data filed at 01/23/2022 0655 Gross per 24 hour  Intake --  Output 420 ml  Net -420 ml   Filed Weights   01/19/22 0500 01/21/22 0435 01/23/22 0500  Weight: 89.8 kg 80.8 kg 86.4 kg    Examination:  General exam: Appears calm and comfortable  Respiratory system: Clear to auscultation. Respiratory effort normal. Cardiovascular system: S1 & S2 heard, RRR. No JVD, murmurs, rubs, gallops or clicks. No pedal edema. Gastrointestinal system: Abdomen is nondistended, soft and nontender. No organomegaly or masses felt. Normal bowel sounds heard. Central nervous system: Alert and oriented. No focal neurological deficits. Extremities: wound vac left shoulder, no evidence of infection, sling in place, wwp Skin: No rashes, lesions or ulcers Psychiatry: Judgement and insight appear normal. Mood & affect appropriate    Data Reviewed: I have personally reviewed following labs and imaging studies  CBC: Recent Labs  Lab 01/18/22 2246 01/19/22 0328 01/20/22 0433 01/21/22 0105 01/22/22 0047  WBC 10.0 9.3 6.2 8.4  --   HGB 9.1* 9.1* 8.6* 8.3* 7.9*  HCT 31.1* 30.0* 28.4* 26.9* 26.6*  MCV 88.9 87.2 87.9 86.5  --   PLT 275 309 239 301  --    Basic Metabolic Panel: Recent Labs  Lab 01/18/22 2244 01/18/22 2246 01/19/22 0544 01/20/22 0433 01/21/22 0105 01/22/22 0047 01/23/22 0032  NA 135  --  139 138 135 136 134*  K 4.0  --  4.0 3.9 3.5 3.7 4.3  CL 106  --  104 104 104 102 103  CO2 24  --  27 27 23 23 23   GLUCOSE 138*  --  133* 117* 103* 133* 145*  BUN 27*  --  23 24* 40* 46* 42*  CREATININE 0.89   < > 0.90 0.89 1.10 1.01 0.99  CALCIUM 7.9*  --  7.8* 8.0* 8.0* 8.2* 8.4*  MG 1.7  --   --  1.5* 1.7 2.0 1.9   < > = values in this interval not displayed.   GFR: Estimated Creatinine Clearance: 73.9 mL/min (by C-G formula based on SCr of 0.99 mg/dL). Liver Function Tests: Recent Labs  Lab 01/17/22 0231   ALBUMIN 2.3*   No results for input(s): LIPASE, AMYLASE in the last 168 hours. No results for input(s): AMMONIA in the last 168 hours. Coagulation Profile: No results for input(s): INR, PROTIME in the last 168 hours. Cardiac Enzymes: No results for input(s): CKTOTAL, CKMB, CKMBINDEX, TROPONINI in the last 168 hours. BNP (last 3 results) No results for input(s): PROBNP in the last 8760 hours. HbA1C: No results for input(s): HGBA1C in the last 72 hours. CBG: No results for input(s): GLUCAP in the last 168 hours. Lipid Profile: No results for input(s): CHOL, HDL, LDLCALC, TRIG, CHOLHDL, LDLDIRECT in the last 72 hours. Thyroid Function Tests: No results for input(s): TSH, T4TOTAL, FREET4, T3FREE, THYROIDAB in the last 72 hours. Anemia Panel: No results for input(s): VITAMINB12, FOLATE, FERRITIN, TIBC, IRON, RETICCTPCT in the last 72 hours. Sepsis Labs: No results for input(s): PROCALCITON, LATICACIDVEN in the last 168 hours.  Recent Results (from the past 240 hour(s))  Resp Panel by RT-PCR (Flu A&B, Covid) Nasopharyngeal Swab     Status: None   Collection Time: 01/15/22 11:38 PM   Specimen: Nasopharyngeal Swab; Nasopharyngeal(NP) swabs in vial transport medium  Result Value Ref Range Status   SARS Coronavirus 2 by RT PCR NEGATIVE NEGATIVE Final    Comment: (NOTE) SARS-CoV-2 target nucleic acids are NOT DETECTED.  The SARS-CoV-2 RNA is generally detectable in upper respiratory specimens during the acute phase of infection. The lowest concentration of SARS-CoV-2 viral copies this assay can detect is 138 copies/mL. A negative result does not preclude SARS-Cov-2 infection and should not be used as the sole basis for treatment or other patient management decisions. A negative result may occur with  improper specimen collection/handling, submission of specimen other than nasopharyngeal swab, presence of viral mutation(s) within the areas targeted by this assay, and inadequate number  of viral copies(<138 copies/mL). A negative result must be combined with clinical observations, patient history, and epidemiological information. The expected result is Negative.  Fact Sheet for Patients:  EntrepreneurPulse.com.au  Fact Sheet for Healthcare Providers:  IncredibleEmployment.be  This test is no t yet approved or cleared by the [place] FDA and  has been authorized for detection and/or diagnosis of SARS-CoV-2 by FDA under an Emergency Use Authorization (EUA). This EUA will remain  in effect (meaning this test can be used) for the duration of the COVID-19 declaration under Section 564(b)(1) of the Act, 21 U.S.C.section 360bbb-3(b)(1), unless the authorization is terminated  or revoked sooner.       Influenza A by PCR NEGATIVE NEGATIVE Final   Influenza B by PCR NEGATIVE NEGATIVE Final  Comment: (NOTE) The Xpert Xpress SARS-CoV-2/FLU/RSV plus assay is intended as an aid in the diagnosis of influenza from Nasopharyngeal swab specimens and should not be used as a sole basis for treatment. Nasal washings and aspirates are unacceptable for Xpert Xpress SARS-CoV-2/FLU/RSV testing.  Fact Sheet for Patients: EntrepreneurPulse.com.au  Fact Sheet for Healthcare Providers: IncredibleEmployment.be  This test is not yet approved or cleared by the [place] FDA and has been authorized for detection and/or diagnosis of SARS-CoV-2 by FDA under an Emergency Use Authorization (EUA). This EUA will remain in effect (meaning this test can be used) for the duration of the COVID-19 declaration under Section 564(b)(1) of the Act, 21 U.S.C. section 360bbb-3(b)(1), unless the authorization is terminated or revoked.  Performed at [facility], [place] [address] [place], [place] [address]   MRSA Next Gen by PCR, Nasal     Status: Abnormal   Collection Time: 01/16/22 12:47 AM   Specimen:  Nasal Mucosa; Nasal Swab  Result Value Ref Range Status   MRSA by PCR Next Gen DETECTED (A) NOT DETECTED Final    Comment: (NOTE) The GeneXpert MRSA Assay (FDA approved for NASAL specimens only), is one component of a comprehensive MRSA colonization surveillance program. It is not intended to diagnose MRSA infection nor to guide or monitor treatment for MRSA infections. Test performance is not FDA approved in patients less than 78 years old. Performed at [facility], [place] [address] [place], [place] [address]   Aerobic/Anaerobic Culture w Gram Stain (surgical/deep wound)     Status: None   Collection Time: 01/17/22  2:52 PM   Specimen: Abscess  Result Value Ref Range Status   Specimen Description   Final    ABSCESS ABSCESS Performed at [place] [address] [place], [place] [address]    Special Requests   Final    LEFT Performed at [facility], [place] [address] [place], [place] [address]    Gram Stain   Final    NO SQUAMOUS EPITHELIAL CELLS SEEN FEW WBC SEEN NO ORGANISMS SEEN    Culture   Final    RARE METHICILLIN RESISTANT STAPHYLOCOCCUS AUREUS NO ANAEROBES ISOLATED Performed at [facility], [address], [place], [place] [address]    Report Status 01/23/2022 FINAL  Final   Organism ID, Bacteria METHICILLIN RESISTANT STAPHYLOCOCCUS AUREUS  Final      Susceptibility   Methicillin resistant staphylococcus aureus - MIC*    CIPROFLOXACIN >=8 RESISTANT Resistant     ERYTHROMYCIN >=8 RESISTANT Resistant     GENTAMICIN <=0.5 SENSITIVE Sensitive     OXACILLIN >=4 RESISTANT Resistant     TETRACYCLINE <=1 SENSITIVE Sensitive     VANCOMYCIN 1 SENSITIVE Sensitive     TRIMETH/SULFA <=10 SENSITIVE Sensitive     CLINDAMYCIN >=8 RESISTANT Resistant     RIFAMPIN <=0.5 SENSITIVE Sensitive     Inducible Clindamycin NEGATIVE Sensitive     * RARE METHICILLIN RESISTANT STAPHYLOCOCCUS AUREUS  Aerobic/Anaerobic  Culture w Gram Stain (surgical/deep wound)     Status: None (Preliminary result)   Collection Time: 01/18/22  6:41 PM   Specimen: PATH Other; Tissue  Result Value Ref Range Status   Specimen Description SHOULDER JOINT FLUID  Final   Special Requests SWAB A  Final   Gram Stain   Final    NO SQUAMOUS EPITHELIAL CELLS SEEN FEW WBC SEEN NO ORGANISMS SEEN    Culture   Final    NO GROWTH  4 DAYS NO ANAEROBES ISOLATED; CULTURE IN PROGRESS FOR 5 DAYS Performed at [facility], [place] [address], [place], [place] [address]    Report Status PENDING  Incomplete  Aerobic/Anaerobic Culture w Gram Stain (surgical/deep wound)     Status: None (Preliminary result)   Collection Time: 01/18/22  6:46 PM   Specimen: PATH Other; Tissue  Result Value Ref Range Status   Specimen Description SHOULDER LEFT JOINT FLUID  Final   Special Requests SWAB B  Final   Gram Stain   Final    NO SQUAMOUS EPITHELIAL CELLS SEEN FEW WBC SEEN NO ORGANISMS SEEN    Culture   Final    NO GROWTH 4 DAYS NO ANAEROBES ISOLATED; CULTURE IN PROGRESS FOR 5 DAYS Performed at [facility], [place] [address] [place], [place] [address]    Report Status PENDING  Incomplete  Aerobic/Anaerobic Culture w Gram Stain (surgical/deep wound)     Status: None (Preliminary result)   Collection Time: 01/18/22  6:46 PM   Specimen: PATH Other; Tissue  Result Value Ref Range Status   Specimen Description SHOULDER LEFT JOINT FLUID  Final   Special Requests SWAB C  Final   Gram Stain   Final    NO SQUAMOUS EPITHELIAL CELLS SEEN FEW WBC SEEN NO ORGANISMS SEEN    Culture   Final    NO GROWTH 4 DAYS NO ANAEROBES ISOLATED; CULTURE IN PROGRESS FOR 5 DAYS Performed at [facility], [place] [address] [place], [place] [address]    Report Status PENDING  Incomplete         Radiology Studies: No results found.      Scheduled Meds:  allopurinol  100 mg Oral Daily   vitamin C  500 mg Oral Daily   aspirin EC  81 mg Oral Daily    buPROPion  300 mg Oral q morning   busPIRone  15 mg Oral TID   cholecalciferol  1,000 Units Oral Daily   docusate sodium  100 mg Oral BID   enoxaparin (LOVENOX) injection  40 mg Subcutaneous Q24H   feeding supplement  237 mL Oral BID BM   furosemide  20 mg Oral Daily   gabapentin  300 mg Oral BID   hydrOXYzine  25 mg Oral TID   melatonin  3 mg Oral QHS   multivitamin with minerals  1 tablet Oral Daily   nutrition supplement (JUVEN)  1 packet Oral BID BM   Ensure Max Protein  11 oz Oral Daily   QUEtiapine  50 mg Oral QHS   rosuvastatin  40 mg Oral Daily   vitamin B-12  1,000 mcg Oral Daily   zinc sulfate  220 mg Oral Daily   Continuous Infusions:  sodium chloride 10 mL/hr at 01/18/22 2302   lactated ringers 10 mL/hr at 01/18/22 1456   vancomycin 750 mg (01/23/22 1159)     LOS: 8 days    Time spent: 62 min    Nicolette Bang, MD [facility]  If 7PM-7AM, please contact night-coverage  01/23/2022, 2:25 PM

## 2022-01-23 NOTE — Progress Notes (Signed)
Patient evaluated earlier this morning.  Reports that shoulder pain is improving but still causes him some discomfort.  Continues to deny any fevers, chills, night sweats.  No drainage in the wound VAC canister.  Shoulder still reduced.  Axillary nerve is firing.  2+ radial pulse of the operative extremity.  He is remaining in the sling as directed.  Continue with IV antibiotics and cleared for disposition from orthopedic standpoint.  Need to switch to Prevena wound VAC portable canister upon discharge.   ?

## 2022-01-23 NOTE — Progress Notes (Signed)
Mobility Specialist Progress Note: ? ? 01/23/22 1345  ?Mobility  ?Activity Ambulated with assistance in room  ?Level of Assistance Standby assist, set-up cues, supervision of patient - no hands on  ?Assistive Device [place]  ?Distance Ambulated (ft) 110 ft  ?Activity Response Tolerated well  ?$Mobility charge 1 Mobility  ? ?Pt received in bed. Complaints of shoulder pain. Left in chair with call bell in reach and all needs met.  ? ?Jillian Pianka ?Mobility Specialist ?Primary Phone [phone] ? ?

## 2022-01-24 DIAGNOSIS — I959 Hypotension, unspecified: Secondary | ICD-10-CM

## 2022-01-24 DIAGNOSIS — F1010 Alcohol abuse, uncomplicated: Secondary | ICD-10-CM | POA: Diagnosis not present

## 2022-01-24 DIAGNOSIS — I483 Typical atrial flutter: Secondary | ICD-10-CM | POA: Diagnosis not present

## 2022-01-24 DIAGNOSIS — S41002A Unspecified open wound of left shoulder, initial encounter: Secondary | ICD-10-CM | POA: Diagnosis not present

## 2022-01-24 DIAGNOSIS — I712 Thoracic aortic aneurysm, without rupture, unspecified: Secondary | ICD-10-CM

## 2022-01-24 DIAGNOSIS — I5043 Acute on chronic combined systolic (congestive) and diastolic (congestive) heart failure: Secondary | ICD-10-CM | POA: Diagnosis not present

## 2022-01-24 LAB — AEROBIC/ANAEROBIC CULTURE W GRAM STAIN (SURGICAL/DEEP WOUND)
Culture: NO GROWTH
Culture: NO GROWTH
Culture: NO GROWTH
Gram Stain: NONE SEEN
Gram Stain: NONE SEEN
Gram Stain: NONE SEEN

## 2022-01-24 LAB — BASIC METABOLIC PANEL
Anion gap: 7 (ref 5–15)
BUN: 40 mg/dL — ABNORMAL HIGH (ref 8–23)
CO2: 25 mmol/L (ref 22–32)
Calcium: 8.3 mg/dL — ABNORMAL LOW (ref 8.9–10.3)
Chloride: 106 mmol/L (ref 98–111)
Creatinine, Ser: 1.05 mg/dL (ref 0.61–1.24)
GFR, Estimated: >60 mL/min (ref >60)
Glucose, Bld: 99 mg/dL (ref 70–99)
Potassium: 3.5 mmol/L (ref 3.5–5.1)
Sodium: 138 mmol/L (ref 135–145)

## 2022-01-24 LAB — MAGNESIUM: Magnesium: 1.9 mg/dL (ref 1.7–2.4)

## 2022-01-24 MED ORDER — VITAMIN B-6 50 MG PO TABS
50.0000 mg | ORAL_TABLET | Freq: Every day | ORAL | Status: AC
Start: 2022-01-24 — End: 2022-02-06
  Administered 2022-01-24 – 2022-02-06 (×14): 50 mg via ORAL
  Filled 2022-01-24 (×15): qty 1

## 2022-01-24 NOTE — TOC Progression Note (Addendum)
Transition of Care (TOC) - Progression Note  ? ? ?Patient Details  ?Name: Jeremiah [name] ?MRN: [id number] ?Date of Birth: Oct 13, 1951 ? ?Transition of Care (TOC) CM/SW Contact  ?Marilu Favre, RN ?Phone Number: ?01/24/2022, 12:38 PM ? ?Clinical Narrative:    ?Dietitian came to [facility] and reported patient may be able to stay with church family [facility]) . NCM went and spoke to patient who stated that he does not have a big church family , he has no one to call.  ? ? ? ?Patient's brother is listed as only contact. Patient will not consent to NCM calling brother. Patient states he left there on "bad terms".  ? ?NCM has secure chatted [place] who was looking into housing for patient awaiting call back  ? ? ?Expected Discharge Plan: [place] ?Barriers to Discharge: Continued Medical Work up ? ?Expected Discharge Plan and Services ?Expected Discharge Plan: [place] ?  ?Discharge Planning Services: CM Consult ?  ?Living arrangements for the past 2 months: [place] ?                ?  ?  ?  ?  ?  ?  ?  ?  ?  ?  ? ? ?Social Determinants of Health (SDOH) Interventions ?  ? ?Readmission Risk Interventions ?No flowsheet data found. ? ?

## 2022-01-24 NOTE — Progress Notes (Signed)
Pharmacy Antibiotic Note ? ?Jeremiah [name] is a 71 y.o. male admitted on 01/15/2022 with medical history significant of CAD s/p bypass in 1998, Hx of PE and DVT on Eliquis, HTN, alcohol abuse with hx of withdrawal, opioid dependency previous on Suboxone, presents with acute onset left shoulder pain and left-sided neck pain. 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 On 2/28 ortho removed infected left shoulder prosthetic, reverse shoulder replacement, and abx spacer placement.  Pharmacy has been consulted for vancomycin dosing. ? ?D9 Vancomycin ?WBC normal, afebrile ?SCr wnl and stable  ? ?3/3 VT: 18    VP: 26  ?Steady state AUC 558.2, Cmax 35, Cmin 17.3 ? ?Plan: ?Continue Vancomycin to '750mg'$  q12 hr ?Monitor weekly vancomycin levels. 6 week LOT  ?Given challenging social circumstances (homeless), team is looking to set up patient for weekly outpatient dalbavancin therapy  ? ? ?Height: '5\' 7"'$  (170.2 cm) ?Weight: 85.8 kg (189 lb 1.6 oz) ?IBW/kg (Calculated) : 66.1 ? ?Temp (24hrs), Avg:98.1 ?F (36.7 ?C), Min:97.9 ?F (36.6 ?C), Max:98.2 ?F (36.8 ?C) ? ?Recent Labs  ?Lab 01/18/22 ?2246 01/19/22 ?0328 01/19/22 ?3009 01/20/22 ?2330 01/21/22 ?0105 01/21/22 ?0762 01/21/22 ?1421 01/22/22 ?2633 01/23/22 ?3545 01/24/22 ?0117  ?WBC 10.0 9.3  --  6.2 8.4  --   --   --   --   --   ?CREATININE 0.91  --    < > 0.89 1.10  --   --  1.01 0.99 1.05  ?[place]  --   --   --   --   --  18  --   --   --   --   ?VANCOPEAK  --   --   --   --   --   --  26*  --   --   --   ? < > = values in this interval not displayed.  ? ?  ?Estimated Creatinine Clearance: 69.5 mL/min (by C-G formula based on SCr of 1.05 mg/dL).   ? ?Allergies  ?Allergen Reactions  ? Morphine Hives, Swelling and Rash  ?  Has taken with no reaction since original reaction  ? ? ?Antimicrobials this admission: ?Vancomycin 2/26 >> ?PTA Doxy 2/14 >>2/25 (not taking?) ? ?Dose adjustments this admission: ?3/1 vancomycin '1250mg'$  Q12hr >  '750mg'$  Q12 hr  ? ?Microbiology results: ?2/27 abscess: rare MRSA  ?2/28 OR joint fluid: negF ?2/26 MRSA PCR + ? ?Thank you for allowing pharmacy to be a part of this patient?s care. ? ?Albertina Parr, PharmD., BCCCP ?Clinical Pharmacist ?Please refer to [facility] for unit-specific pharmacist  ? ? ?Please check [email or web address] for unit-specific pharmacy phone numbers. ? ?

## 2022-01-24 NOTE — Progress Notes (Signed)
PT Cancellation Note ? ?Patient Details ?Name: Jeremiah [name] ?MRN: [id number] ?DOB: [birth year] ? ? ?Cancelled Treatment:    Reason Eval/Treat Not Completed: Other (comment).  Pt refused therapy x 2 for pain meds and to rest, and will retry as time and pt allow. ? ? ?Ramond Dial ?01/24/2022, 2:09 PM ? ?Mee Hives, PT PhD ?Acute Rehab Dept. Number: [facility] [phone] and [place] [phone] ? ?

## 2022-01-24 NOTE — Progress Notes (Signed)
Mobility Specialist Progress Note: ? ? 01/24/22 1056  ?Mobility  ?Activity Ambulated with assistance in hallway  ?Level of Assistance Standby assist, set-up cues, supervision of patient - no hands on  ?Assistive Device [place]  ?Distance Ambulated (ft) 300 ft  ?Activity Response Tolerated well  ?$Mobility charge 1 Mobility  ? ?Pt received in chair willing to participate in mobility. Complaints of shoulder pain. Left in chair with cal bell in reach and all needs met.  ? ?Jeremiah [name] ?Mobility Specialist ?Primary Phone [phone] ? ?

## 2022-01-24 NOTE — Progress Notes (Signed)
Mobility Specialist Progress Note: ? ? 01/24/22 1350  ?Mobility  ?Activity Ambulated with assistance in hallway  ?Level of Assistance Standby assist, set-up cues, supervision of patient - no hands on  ?Assistive Device [place]  ?Distance Ambulated (ft) 300 ft  ?Activity Response Tolerated well  ?$Mobility charge 1 Mobility  ? ?Pt received in bed asking to ambulate. Complaints of 7/10 pain in his L Shoulder pain. Left in bed with call bell in reach and all needs met.  ? ?Jeremiah [name] ?Mobility Specialist ?Primary Phone [phone] ? ?

## 2022-01-24 NOTE — Progress Notes (Signed)
Patient stable on evaluation earlier this morning.  Wound VAC still functional with suction intact.  No drainage in the wound VAC canister.  Patient does complain of some increased elbow pain so some foam padding was provided to cushion his elbow.  No fevers, chills, night sweats.  Okay for discharge from orthopedic standpoint.  Plan to discontinue wound VAC tomorrow and replaced with Aquacel bandage overlying the incision. ?

## 2022-01-24 NOTE — Progress Notes (Signed)
Nutrition Follow-up ? ?DOCUMENTATION CODES:  ?Severe malnutrition in context of social or environmental circumstances ? ?INTERVENTION:  ?Continue current diet as ordered, encourage PO intake ?Will add snacks between meals to encourage intake ?Ensure Enlive po BID, each supplement provides 350 kcal and 20 grams of protein. ?Ensure Max po 1x/d, each supplement provides 150 kcal and 30 grams of protein.  ?1 packet Juven BID, each packet provides 95 calories, 2.5 grams of protein (collagen), and 9.8 grams of carbohydrate (3 grams sugar) + micronutrients for wound healing ?MVI with minerals daily ?Vitamin C '500mg'$  and zinc '220mg'$  daily x 14 days ?Assess vitamin labs to screen for deficiencies (Vitamin A, E, K, still pending) ?Replete vitamin D and b6 for serum deficiencies ? ?NUTRITION DIAGNOSIS:  ?Severe Malnutrition related to social / environmental circumstances (etoh and drug use, hx of gastric bypass) as evidenced by severe fat depletion, severe muscle depletion. ?- remains applicable ? ?GOAL:  ?Patient will meet greater than or equal to 90% of their needs ?- nutrition supplements in place ? ?MONITOR:  ?PO intake, Labs, Supplement acceptance, Weight trends ? ?REASON FOR ASSESSMENT:  ?Consult ?Assessment of nutrition requirement/status, Poor PO, Wound healing ? ?ASSESSMENT:  ?71 y.o. male with history of CAD s/p CABGx4, HTN, HLD, CHF (EF 35-40%), GERD, gout, alcohol abuse, opioid dependency, hx of gastric bypass surgery, and CKD2, presented to ED with worsening left shoulder pain and left-sided neck pain from an injury which occurred ~a year ago. Noted pt is currently homeless and has been for about 6 months. ? ?2/28 - Op, left RSA implant removal with I&D of shoulder joint, placement of antibiotic spacer and wound vac. Hemovac drain placed ? ?Pt resting in bedside chair at the time of assessment. Lunch tray at bedside minimally consumed. Also noted a decrease in oral intake this week. Pt reports feeling stressed this  week which is causing a decrease in appetite. His phone is locked and he is having a hard time getting in touch with his bank due to not being able to access the internet on his phone. Also frustrated at not having a place to go.  ? ?Passed along name of church to case management team - pt mentioned at last assessment he had good support in [place] due to his church.  ? ?Discussed nutrition supplements, pt reports liking both the ensure and juven. Consuming both each time they are given. Weight appears stable. Since admission. Pt agreeable to a snack between meals to augment intake on days he is eating poorly. ? ?Average Meal Intake: ?2/26-3/1: 91% intake x 7 recorded meal ?3/2-3/6: 69% intake x 5 recorded meals ? ?Nutritionally Relevant Medications: ?Scheduled Meds: ? vitamin C  500 mg Oral Daily  ? cholecalciferol  1,000 Units Oral Daily  ? docusate sodium  100 mg Oral BID  ? feeding supplement  237 mL Oral BID BM  ? furosemide  20 mg Oral Daily  ? multivitamin with minerals  1 tablet Oral Daily  ? JUVEN  1 packet Oral BID BM  ? Ensure Max Protein  11 oz Oral Daily  ? rosuvastatin  40 mg Oral Daily  ? vitamin B-12  1,000 mcg Oral Daily  ? zinc sulfate  220 mg Oral Daily  ? ?Continuous Infusions: ? vancomycin 750 mg (01/24/22 1005)  ? ?PRN Meds: phenol, metoCLOPramide ? ?Labs Reviewed: ?BUN 40  ? ?Vitamin Labs ?Vitamin A (pending) ?Vitamin B1 108.8 (normal, 3/2) ?Vitamin B6 2.0 (low, 3/2) ?Vitamin B12 176 (low, 2/26)  ?Vitamin C .  6 (normal, 3/2) ?Vitamin D 13.65 (low, 3/2) ?Vitamin E (pending) ?Vitamin K (pending) ?Folate 20.8 (normal, 2/26) ?Copper 97 (normal 3/2) ?Zinc 45 (normal, 3/2) ? ?NUTRITION - FOCUSED PHYSICAL EXAM: ?Flowsheet Row Most Recent Value  ?Orbital Region Severe depletion  ?Upper Arm Region Severe depletion  ?Thoracic and Lumbar Region Severe depletion  ?Buccal Region Severe depletion  ?Temple Region Severe depletion  ?Clavicle Bone Region Mild depletion  ?Clavicle and Acromion Bone Region Mild  depletion  ?Scapular Bone Region Mild depletion  ?Dorsal Hand Severe depletion  ?Patellar Region Moderate depletion  ?Anterior Thigh Region Moderate depletion  ?Posterior Calf Region Severe depletion  ?Edema (RD Assessment) Mild  ?Hair Reviewed  ?Eyes Reviewed  ?Mouth Reviewed  ?Skin Reviewed  [scattered brusing]  ?Nails Reviewed  ? ?Diet Order:   ?Diet Order   ? ?       ?  Diet regular Room service appropriate? Yes; Fluid consistency: Thin  Diet effective now       ?  ? ?  ?  ? ?  ? ? ?EDUCATION NEEDS:  ?Education needs have been addressed ? ?Skin:  Skin Assessment: Skin Integrity Issues: ?Skin Integrity Issues:: Wound VAC, Other (Comment) ?Wound Vac: left shoulder ?Other: abrasions to the head, legs, and shoulders; ecchymosis to the bilateral arms and legs ? ?Last BM:  3/4 - type 3 ? ?Height:  ?Ht Readings from Last 1 Encounters:  ?01/18/22 '5\' 7"'$  (1.702 m)  ? ? ?Weight:  ?Wt Readings from Last 1 Encounters:  ?01/24/22 85.8 kg  ? ? ?Ideal Body Weight:  67.3 kg ? ?BMI:  Body mass index is 29.62 kg/m?. ? ?Estimated Nutritional Needs:  ?Kcal:  2200-2500 kcal/d ?Protein:  120-135g/d ?Fluid:  2.3-2.5 L/d ? ? ?Ranell Patrick, RD, LDN ?Clinical Dietitian ?RD pager # available in [place]  ?After hours/weekend pager # available in [place] ?

## 2022-01-24 NOTE — Progress Notes (Signed)
PROGRESS NOTE    Jeremiah [name]  MCN:[id number] DOB: 11/10/1951 DOA: 01/15/2022 PCP: Carol Ada, MD    Brief Narrative:  Patient is a 71 y.o. male with medical history significant of CAD s/p bypass in 1998, Hx of PE and DVT on Eliquis, HTN, alcohol abuse with hx of withdrawal, opioid dependency previous on Suboxone presented to the hospital with left shoulder and neck pain.  Reportedly patient lost his wife last year and moved out to [place] to live with his brother. However did not get along with his sister-in-law and has recently moved back here to [place] l a week prior to presentation and noted to be homeless and living in [place]. Noted to have multiple admissions recently in [place] for alcohol withdrawal.  On 10/2021 he was admitted following a fall and was noted to have a large left-sided chest wall hematoma  had multiple I&D and wound VAC in January due to persistent refractory hematoma, found to have chronically dislocated left shoulder arthroplasty in the setting of likely infection.  On 02/06/2021-CTA showed large burden of thromboembolic disease with central pulmonary artery distribution.  Saw Pulm in office with Dr Janett Billow at [facility]- who noted 'it sounds like his pulmonary embolism was associated with some lower extremity cellulitis which he was hospitalized and minimally mobile. He was treated with Eliquis, now off.  In the ED on this admission patient was hemodynamically stable.  Labs showed some mild anemia of hemoglobin 9.1.  EKG showed atrial flutter.  Troponin was borderline.  Left shoulder x-ray showed anterior dislocation of left reverse total shoulder arthroplasty.  Patient had CTA chest on 01/16/2022 which showed no evidence of pulmonary embolism but pulmonary hypertension and cardiomegaly.  There was note of 4.7 cm thoracic aortic aneurysm.  Radiology recommended dysemia [place] CT or MRI follow-up and referral to cardiothoracic surgery as outpatient.   Patient was also seen by orthopedics during hospitalization  and the  CT of left shoulder-showed postsurgical changes and dislocation, large periprosthetic fluid collection surrounding the proximal humerus and extending throughout the left axillary region up to 7.8 X4.4X 8.8-sequelae of postoperative seroma, hematoma versus particle disease.  Patient had 2D echocardiogram on 01/16/2022 which showed reduced LV function and cardiology was consulted.  On 01/17/2022 CT-guided biopsy of the left anterior axillary fluid was done with removal of 30 mils of cloudy fluid with.  Patient underwent removal of shoulder implant with excision of sinus tract antibiotic spacer placement and wound VAC placement by Dr. Marlou Sa on 01/18/2022.  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  ID on board as well.  Acute on chronic combined systolic and diastolic CHF (congestive heart failure) (HCC) Acute on chronic systolic CHF with EF 35 to 40% Moderate to severe aortic regurgitation on prior echo now mild Aortic root 4.27 cm Medical nonadherence Lower leg edema: Patient was supposed to be on Bumex which he ran out for several months.  Continue CHF protocol.  Seen by cardiology during this hospitalization.  Net IO Since Admission: -5,855  mL [01/18/22 0955]   Alcohol abuse No active signs of withdrawal at this time.  Patient is homeless.  CAD (coronary artery disease) CAD  s/p cabg x4 1998 Chronic trifascicular block Hyperlipidemia Continue aspirin and statins.  Hold AV nodal blockers.   Thoracic aortic aneurysm (TAA) CTA incidentally showed a 4.7 cm thoracic aortic aneurysm, will need semiannual CT or MRI imaging/cardiothoracic surgery follow-up as outpatient  Hypotension Improved at this time.  Not on nodal blockers.   B12 deficiency Continue replacement.  History of pulmonary embolism Completed treatment with Eliquis.  Not currently on anticoagulation due to risk of falls homelessness, history of hematoma, ongoing alcohol abuse.  Depression Continue hydroxyzine, Wellbutrin, buspirone.  Normocytic anemia Latest hemoglobin was 7.9.  We will continue to monitor.  Shoulder dislocation, recurrent, left See above.   Atrial flutter ([place]) New onset a flutter in the ED Asymptomatic bradycardia during the night 2/26: TSH was within normal limit.  Patient did have pause for 2.90 seconds.  Cardiology was consulted.  Avoid nodal blockers.  Continue anticoagulation     DVT prophylaxis: enoxaparin (LOVENOX) injection 40 mg Start: 01/19/22 1400 SCD's Start: 01/18/22 2238 Place TED hose Start: 01/18/22 2238 SCDs Start: 01/15/22 2347   Code Status:     Code Status: Full Code  Disposition: Patient is currently homeless, pending arrangement for antibiotic and housing.  Status is: Inpatient Remains inpatient appropriate because: Pending clinical improvement,   Family Communication: Spoke with the patient at bedside.  Consultants:  Orthopedics Cardiology  Procedures:  CT-guided aspiration on 01/17/2022  Antimicrobials:  Vancomycin IV  Anti-infectives (From admission, onward)    Start     Dose/Rate Route Frequency Ordered Stop   01/19/22 1030  vancomycin (VANCOREADY) IVPB 750 mg/150 mL        750 mg 150 mL/hr over 60 Minutes Intravenous Every 12 hours 01/19/22 1027     01/18/22 1925  vancomycin (VANCOCIN) powder  Status:  Discontinued           As needed 01/18/22 1925 01/18/22 2103   01/16/22 2200  vancomycin (VANCOREADY) IVPB 1250 mg/250 mL  Status:  Discontinued        1,250 mg 166.7 mL/hr over 90 Minutes Intravenous Every 24 hours 01/16/22 0107 01/19/22 1027   01/16/22 0030  vancomycin (VANCOREADY) IVPB 1500 mg/300 mL        1,500 mg 150 mL/hr over 120 Minutes Intravenous  Once 01/16/22 0018 01/16/22 0240   01/16/22 0015  vancomycin (VANCOREADY) IVPB 2000 mg/400 mL  Status:  Discontinued        2,000 mg 200 mL/hr over 120 Minutes Intravenous  Once 01/16/22 0001 01/16/22 0018       Subjective: Today, patient was seen and examined at bedside.  Patient still complains of left shoulder pain states that his pain medication does not last long.  Denies any fever chills  Objective: Vitals:   01/23/22 0800 01/23/22 1643 01/24/22 0453 01/24/22 0745  BP: 103/75 120/64 (!) 114/57 115/63  Pulse: 66 95 77 68  Resp: '18 17 18 18  '$ Temp: 98.1 F (36.7 C) 97.9 F (36.6 C)  98.2 F (36.8 C)  TempSrc: Oral Oral  Oral  SpO2: 99% 100% 100% 100%  Weight:   85.8 kg   Height:        Intake/Output Summary (Last 24 hours) at 01/24/2022 1440 Last data filed at 01/24/2022 0912 Gross per 24 hour  Intake 240 ml  Output 2060 ml  Net -1820 ml   [employer]  01/21/22 0435 01/23/22 0500 01/24/22 0453  Weight: 80.8 kg 86.4 kg 85.8 kg   Body mass index is 29.62 kg/m.   Physical Examination:  General:  Average built, not in obvious distress HENT:   No scleral pallor or icterus noted. Oral mucosa is moist.  Chest:  Clear breath sounds.  Diminished breath sounds bilaterally. No crackles or wheezes.  CVS: S1 &S2 heard. No murmur.  Regular rate and rhythm. Abdomen: Soft, nontender, nondistended.  Bowel sounds are heard.   Extremities: No cyanosis, clubbing or edema.  Peripheral pulses are palpable.  Left shoulder in sling.  Wound VAC left shoulder. Psych: Alert, awake and oriented, normal mood CNS:  No cranial nerve deficits.  Power  equal in all extremities.   Skin: Warm and dry.  Left shoulder wound VAC.  Data Reviewed:   CBC: Recent Labs  Lab 01/18/22 2246 01/19/22 0328 01/20/22 0433 01/21/22 0105 01/22/22 0047  WBC 10.0 9.3 6.2 8.4  --   HGB 9.1* 9.1* 8.6* 8.3* 7.9*  HCT 31.1* 30.0* 28.4* 26.9* 26.6*  MCV 88.9 87.2 87.9 86.5  --   PLT 275 309 239 301  --     Basic Metabolic Panel: Recent Labs  Lab 01/20/22 0433 01/21/22 0105 01/22/22 0047 01/23/22 0032 01/24/22 0117  NA 138 135 136 134* 138  K 3.9 3.5 3.7 4.3 3.5  CL 104 104 102 103 106  CO2 '27 23 23 23 25  '$ GLUCOSE 117* 103* 133* 145* 99  BUN 24* 40* 46* 42* 40*  CREATININE 0.89 1.10 1.01 0.99 1.05  CALCIUM 8.0* 8.0* 8.2* 8.4* 8.3*  MG 1.5* 1.7 2.0 1.9 1.9    Liver Function Tests: No results for input(s): AST, ALT, ALKPHOS, BILITOT, PROT, ALBUMIN in the last 168 hours.   Radiology Studies: No results found.    LOS: 9 days    Flora Lipps, MD [facility] 01/24/2022, 2:40 PM

## 2022-01-25 DIAGNOSIS — I483 Typical atrial flutter: Secondary | ICD-10-CM | POA: Diagnosis not present

## 2022-01-25 DIAGNOSIS — S41002A Unspecified open wound of left shoulder, initial encounter: Secondary | ICD-10-CM | POA: Diagnosis not present

## 2022-01-25 DIAGNOSIS — F1010 Alcohol abuse, uncomplicated: Secondary | ICD-10-CM | POA: Diagnosis not present

## 2022-01-25 DIAGNOSIS — E876 Hypokalemia: Secondary | ICD-10-CM

## 2022-01-25 DIAGNOSIS — I5043 Acute on chronic combined systolic (congestive) and diastolic (congestive) heart failure: Secondary | ICD-10-CM | POA: Diagnosis not present

## 2022-01-25 LAB — CBC
HCT: 25.4 % — ABNORMAL LOW (ref 39.0–52.0)
Hemoglobin: 7.8 g/dL — ABNORMAL LOW (ref 13.0–17.0)
MCH: 26.3 pg (ref 26.0–34.0)
MCHC: 30.7 g/dL (ref 30.0–36.0)
MCV: 85.5 fL (ref 80.0–100.0)
Platelets: 350 10*3/uL (ref 150–400)
RBC: 2.97 MIL/uL — ABNORMAL LOW (ref 4.22–5.81)
RDW: 17.1 % — ABNORMAL HIGH (ref 11.5–15.5)
WBC: 5.3 10*3/uL (ref 4.0–10.5)
nRBC: 0 % (ref 0.0–0.2)

## 2022-01-25 LAB — BASIC METABOLIC PANEL
Anion gap: 8 (ref 5–15)
BUN: 39 mg/dL — ABNORMAL HIGH (ref 8–23)
CO2: 25 mmol/L (ref 22–32)
Calcium: 8.5 mg/dL — ABNORMAL LOW (ref 8.9–10.3)
Chloride: 105 mmol/L (ref 98–111)
Creatinine, Ser: 0.84 mg/dL (ref 0.61–1.24)
GFR, Estimated: >60 mL/min (ref >60)
Glucose, Bld: 126 mg/dL — ABNORMAL HIGH (ref 70–99)
Potassium: 3.4 mmol/L — ABNORMAL LOW (ref 3.5–5.1)
Sodium: 138 mmol/L (ref 135–145)

## 2022-01-25 LAB — MAGNESIUM: Magnesium: 1.8 mg/dL (ref 1.7–2.4)

## 2022-01-25 MED ORDER — POTASSIUM CHLORIDE CRYS ER 20 MEQ PO TBCR
40.0000 meq | EXTENDED_RELEASE_TABLET | Freq: Once | ORAL | Status: DC
Start: 2022-01-25 — End: 2022-01-25
  Filled 2022-01-25: qty 2

## 2022-01-25 MED ORDER — POTASSIUM CHLORIDE CRYS ER 20 MEQ PO TBCR
40.0000 meq | EXTENDED_RELEASE_TABLET | Freq: Once | ORAL | Status: AC
  Administered 2022-01-25: 40 meq via ORAL
  Filled 2022-01-25: qty 2

## 2022-01-25 NOTE — Plan of Care (Signed)
?  Problem: Activity: ?Goal: Capacity to carry out activities will improve ?Outcome: Progressing ?  ?Problem: Education: ?Goal: Knowledge of General Education information will improve ?Description: Including pain rating scale, medication(s)/side effects and non-pharmacologic comfort measures ?Outcome: Progressing ?  ?Problem: Nutrition: ?Goal: Adequate nutrition will be maintained ?Outcome: Progressing ?  ?Problem: Coping: ?Goal: Level of anxiety will decrease ?Outcome: Progressing ?  ?Problem: Elimination: ?Goal: Will not experience complications related to bowel motility ?Outcome: Progressing ?Goal: Will not experience complications related to urinary retention ?Outcome: Progressing ?  ?Problem: Pain Managment: ?Goal: General experience of comfort will improve ?Outcome: Progressing ?  ?Problem: Safety: ?Goal: Ability to remain free from injury will improve ?Outcome: Progressing ?  ?Problem: Skin Integrity: ?Goal: Risk for impaired skin integrity will decrease ?Outcome: Progressing ?  ?

## 2022-01-25 NOTE — [place] FL2 (Signed)
?[place] MEDICAID FL2 LEVEL OF CARE SCREENING TOOL  ?  ? ?IDENTIFICATION  ?Patient Name: ?Jeremiah [name] Birthdate: 12-30-1950 Sex: male Admission Date (Current Location): ?01/15/2022  ?[place] and [place] Number: ? [place] ?  Facility and Address:  ?[facility], [place] [address] [place], [place] [address] ?     Provider Number: ?[id number]  ?Attending Physician Name and Address:  ?Pokhrel, Corrie Mckusick, MD ? Relative Name and Phone Number:  ?  ?   ?Current Level of Care: ?Hospital Recommended Level of Care: ?[place] Prior Approval Number: ?  ? ?Date Approved/Denied: ?  PASRR Number: ?pending ? ?Discharge Plan: ?SNF ?  ? ?Current Diagnoses: ?Patient Active Problem List  ? Diagnosis Date Noted  ? Hypokalemia 01/25/2022  ? Protein-calorie malnutrition, severe 01/20/2022  ? CAD (coronary artery disease) 01/18/2022  ? Thoracic aortic aneurysm (TAA) 01/17/2022  ? Acute on chronic combined systolic and diastolic CHF (congestive heart failure) ([place]) 01/16/2022  ? Atrial flutter ([place]) 01/16/2022  ? Traumatic hematoma of left shoulder 01/16/2022  ? Shoulder dislocation, recurrent, left 01/16/2022  ? Normocytic anemia 01/16/2022  ? Alcohol abuse 01/16/2022  ? Lower leg edema 01/16/2022  ? B12 deficiency 01/16/2022  ? Hypotension 01/16/2022  ? Shoulder wound, left, initial encounter 01/16/2022  ? Depression   ? Chest pain 01/15/2022  ? History of pulmonary embolism 03/29/2021  ? Shoulder arthritis 02/27/2018  ? Morbid obesity ([place]) 07/24/2013  ? Expected blood loss anemia 07/24/2013  ? S/P right TKA 07/23/2013  ? AAA (abdominal aortic aneurysm) without rupture 03/18/2011  ? EDEMA 03/09/2009  ? HYPERCHOLESTEROLEMIA 03/06/2009  ? Essential hypertension 03/06/2009  ? Coronary atherosclerosis 03/06/2009  ? BUNDLE BRANCH BLOCK, RIGHT 03/06/2009  ? BRADYCARDIA 03/06/2009  ? ? ?Orientation RESPIRATION BLADDER Height & Weight   ?  ?Self, Time, Situation ? Normal Continent Weight: 189 lb 1.6 oz  (85.8 kg) ?Height:  '5\' 7"'$  (170.2 cm)  ?BEHAVIORAL SYMPTOMS/MOOD NEUROLOGICAL BOWEL NUTRITION STATUS  ?    Continent Diet  ?AMBULATORY STATUS COMMUNICATION OF NEEDS Skin   ?Independent Verbally Normal ?  ?  ?  ?    ?     ?     ? ? ?Personal Care Assistance Level of Assistance  ?Bathing, Feeding, Dressing Bathing Assistance: Independent ?Feeding assistance: Independent ?Dressing Assistance: Independent ?   ? ?Functional Limitations Info  ?Sight, Hearing, Speech Sight Info: Adequate ?Hearing Info: Adequate ?Speech Info: Adequate  ? ? ?SPECIAL CARE FACTORS FREQUENCY  ?    ?  ?  ?  ?  ?  ?  ?   ? ? ?Contractures Contractures Info: Not present  ? ? ?Additional Factors Info  ?Code Status, Allergies Code Status Info: Full ?Allergies Info: Morphine ?  ?  ?  ?   ? ?Current Medications (01/25/2022):  This is the current hospital active medication list ?Current Facility-Administered Medications  ?Medication Dose Route Frequency Provider Last Rate Last Admin  ? 0.9 %  sodium chloride infusion   Intravenous Continuous Magnant, Charles L, PA-C 10 mL/hr at 01/18/22 2302 New Bag at 01/18/22 2302  ? acetaminophen (TYLENOL) tablet 650 mg  650 mg Oral Q6H PRN Tu, Ching T, DO   650 mg at 01/20/22 1653  ? allopurinol (ZYLOPRIM) tablet 100 mg  100 mg Oral Daily Kc, Maren [place], MD   100 mg at 01/25/22 3244  ? ALPRAZolam (XANAX) tablet 1 mg  1 mg Oral Q8H PRN Antonieta Pert, MD   1 mg at 01/25/22 1251  ?  ascorbic acid (VITAMIN C) tablet 500 mg  500 mg Oral Daily [place] Callas, NP   500 mg at 01/25/22 0786  ? aspirin EC tablet 81 mg  81 mg Oral Daily Antonieta Pert, MD   81 mg at 01/25/22 7544  ? buPROPion (WELLBUTRIN XL) 24 hr tablet 300 mg  300 mg Oral q morning Kc, Ramesh, MD   300 mg at 01/25/22 1055  ? busPIRone (BUSPAR) tablet 15 mg  15 mg Oral TID Antonieta Pert, MD   15 mg at 01/25/22 9201  ? cholecalciferol (VITAMIN D3) tablet 1,000 Units  1,000 Units Oral Daily [place] Callas, NP   1,000 Units at 01/25/22 0071  ? docusate sodium (COLACE)  capsule 100 mg  100 mg Oral BID Magnant, Charles L, PA-C   100 mg at 01/25/22 2197  ? enoxaparin (LOVENOX) injection 40 mg  40 mg Subcutaneous Q24H Magnant, Charles L, PA-C   40 mg at 01/25/22 1254  ? feeding supplement (ENSURE ENLIVE / ENSURE PLUS) liquid 237 mL  237 mL Oral BID BM Patrecia Pour, MD   237 mL at 01/25/22 5883  ? furosemide (LASIX) tablet 20 mg  20 mg Oral Daily Donato Heinz, MD   20 mg at 01/25/22 2549  ? gabapentin (NEURONTIN) capsule 300 mg  300 mg Oral BID Kc, Ramesh, MD   300 mg at 01/25/22 0920  ? hydrOXYzine (ATARAX) tablet 10 mg  10 mg Oral Q4H PRN Antonieta Pert, MD   10 mg at 01/18/22 8264  ? hydrOXYzine (ATARAX) tablet 25 mg  25 mg Oral TID Antonieta Pert, MD   25 mg at 01/25/22 1583  ? lactated ringers infusion   Intravenous Continuous Janeece Riggers, MD 10 mL/hr at 01/18/22 1456 New Bag at 01/18/22 1456  ? melatonin tablet 3 mg  3 mg Oral QHS Kc, Ramesh, MD   3 mg at 01/24/22 2241  ? menthol-cetylpyridinium (CEPACOL) lozenge 3 mg  1 lozenge Oral PRN Magnant, Charles L, PA-C      ? Or  ? phenol (CHLORASEPTIC) mouth spray 1 spray  1 spray Mouth/Throat PRN Magnant, Charles L, PA-C      ? metoCLOPramide (REGLAN) tablet 5-10 mg  5-10 mg Oral Q8H PRN Magnant, Charles L, PA-C      ? Or  ? metoCLOPramide (REGLAN) injection 5-10 mg  5-10 mg Intravenous Q8H PRN Magnant, Charles L, PA-C   10 mg at 01/22/22 0940  ? multivitamin with minerals tablet 1 tablet  1 tablet Oral Daily [place] Callas, NP   1 tablet at 01/25/22 7680  ? nutrition supplement (JUVEN) (JUVEN) powder packet 1 packet  1 packet Oral BID BM Patrecia Pour, MD   1 packet at 01/25/22 [phone]  ? oxyCODONE (Oxy IR/ROXICODONE) immediate release tablet 10 mg  10 mg Oral Q4H PRN Patrecia Pour, MD   10 mg at 01/25/22 1300  ? potassium chloride SA (KLOR-CON M) CR tablet 40 mEq  40 mEq Oral Once Pokhrel, [place], MD      ? protein supplement (ENSURE MAX) liquid  11 oz Oral Daily Patrecia Pour, MD   11 oz at 01/24/22 1738  ? pyridOXINE  (VITAMIN B-6) tablet 50 mg  50 mg Oral Daily Pokhrel, Laxman, MD   50 mg at 01/25/22 0315  ? QUEtiapine (SEROQUEL) tablet 50 mg  50 mg Oral QHS Antonieta Pert, MD   50 mg at 01/24/22 2241  ? rosuvastatin (CRESTOR) tablet 40 mg  40 mg Oral  Daily Antonieta Pert, MD   40 mg at 01/25/22 0920  ? vancomycin (VANCOREADY) IVPB 750 mg/150 mL  750 mg Intravenous Q12H Donnamae Jude, RPH 150 mL/hr at 01/25/22 1055 750 mg at 01/25/22 1055  ? vitamin B-12 (CYANOCOBALAMIN) tablet 1,000 mcg  1,000 mcg Oral Daily Antonieta Pert, MD   1,000 mcg at 01/25/22 2763  ? zinc sulfate capsule 220 mg  220 mg Oral Daily [place] Callas, NP   220 mg at 01/25/22 9432  ? ? ? ?Discharge Medications: ?Please see discharge summary for a list of discharge medications. ? ?Relevant Imaging Results: ? ?Relevant Lab Results: ? ? ?Additional Information ?Needs 6 weeks of Vancomycin, SSN [id number] ? ?Emeterio Reeve, LCSW ? ? ? ? ?

## 2022-01-25 NOTE — TOC Progression Note (Addendum)
Transition of Care (TOC) - Progression Note  ? ? ?Patient Details  ?Name: Jeremiah [name] ?MRN: [id number] ?Date of Birth: 12-Jun-1951 ? ?Transition of Care (TOC) CM/SW Contact  ?Marilu Favre, RN ?Phone Number: ?01/25/2022, 12:58 PM ? ?Clinical Narrative:    ? ?Left message for Doroteo Bradford the [facility] team member supervising [facility] [phone] . Await call back  ? ?Will discuss with [facility] supervisor and director in [employer] this afternoon. ? ?Expected Discharge Plan: [place] ?Barriers to Discharge: Continued Medical Work up ? ?Expected Discharge Plan and Services ?Expected Discharge Plan: [place] ?  ?Discharge Planning Services: CM Consult ?  ?Living arrangements for the past 2 months: [place] ?                ?  ?  ?  ?  ?  ?  ?  ?  ?  ?  ? ? ?Social Determinants of Health (SDOH) Interventions ?  ? ?Readmission Risk Interventions ?No flowsheet data found. ? ?

## 2022-01-25 NOTE — Assessment & Plan Note (Addendum)
Renal function has been stable.  Follow up renal function and electrolytes as needed.

## 2022-01-25 NOTE — Progress Notes (Signed)
[facility] Infectious Diseases Follow Up Note  Patient Identification: Patient Name: Jeremiah [name] MRN: [id number] [place] Date: 01/15/2022  6:42 PM Age: 71 y.o.Today's Date: 01/25/2022   Reason for Visit: PJI   Principal Problem:   Shoulder wound, left, initial encounter Active Problems:   Acute on chronic combined systolic and diastolic CHF (congestive heart failure) (HCC)   Atrial flutter (HCC)   Shoulder dislocation, recurrent, left   Normocytic anemia   Alcohol abuse   Depression   History of pulmonary embolism   B12 deficiency   Hypotension   Thoracic aortic aneurysm (TAA)   CAD (coronary artery disease)   Protein-calorie malnutrition, severe   Antibiotics: Vancomycin 2/25-c  Lines/Hardwares: Bilateral knee arthroplasty  Interval Events: continues to be afebrile, no leukocytosis   Assessment Left shoulder PJI  S/p left shoulder aspiration by IR on 2/27.  Or cultures MRSA S/p left shoulder excision of sinus tract, removal of reverse shoulder replacement, antibiotic spacer placement and placement of wound VAC.  Or cultures no growth with no organisms in gram stain ( final ).   2. Left sided chest wall hematoma s/p multiple I and Ds ( MRSA) at [place] - stable  3. Alcohol abuse 4. H/o Opioid dependence 5. Homelessness   Recommendations Continue Vancomycin, pharmacy to dose for now while working on disposition plan   SW and CM working on weekly long acting injections ( dalbavancin) given he does not have stable housing, transportation and social support.    He has h/o alcohol abuse and  twice seen in the ED in February ( [place]) for head injury and alcohol abuse and ams. It would be unsafe for him to discharge on PO antibiotics at this point given he does not have a stable housing and this is a Prosthetic joint infection.   Total duration of antibiotics is 6 weeks from 2/28.   Follow up with case  management and social worker Monitor CBC, BMP, Vancomycin trough ID available as needed. Please call with questions   Rest of the management as per the primary team. Thank you for the consult. Please page with pertinent questions or concerns.  ______________________________________________________________________ Subjective patient seen and examined at the bedside. Walking with PT Pain at the left shoulder and arm Good appetite  Vitals BP (!) 109/51 (BP Location: Right Wrist)    Pulse 82    Temp 98.2 F (36.8 C)    Resp 18    Ht '5\' 7"'$  (1.702 m)    Wt 85.8 kg    SpO2 100%    BMI 29.62 kg/m     Physical Exam Constitutional:  sitting up in the bed, appears comfortable    Comments:   Cardiovascular:     Rate and Rhythm: Normal rate and regular rhythm.     Heart sounds:   Pulmonary:     Effort: Pulmonary effort is normal on room air     Comments:   Abdominal: Non distended     Palpations:     Tenderness:   Musculoskeletal:        General: No swelling or tenderness.   Skin:    Comments:   Neurological:     General: left shoulder is wrapped an a bandage with an arm sling. Able to squeeze with left hand   Psychiatric:        Mood and Affect: Mood normal.   Pertinent Microbiology Results for orders placed or performed during the hospital encounter of 01/15/22  Resp Panel by RT-PCR (Flu  A&B, Covid) Nasopharyngeal Swab     Status: None   Collection Time: 01/15/22 11:38 PM   Specimen: Nasopharyngeal Swab; Nasopharyngeal(NP) swabs in vial transport medium  Result Value Ref Range Status   SARS Coronavirus 2 by RT PCR NEGATIVE NEGATIVE Final    Comment: (NOTE) SARS-CoV-2 target nucleic acids are NOT DETECTED.  The SARS-CoV-2 RNA is generally detectable in upper respiratory specimens during the acute phase of infection. The lowest concentration of SARS-CoV-2 viral copies this assay can detect is 138 copies/mL. A negative result does not preclude SARS-Cov-2 infection and  should not be used as the sole basis for treatment or other patient management decisions. A negative result may occur with  improper specimen collection/handling, submission of specimen other than nasopharyngeal swab, presence of viral mutation(s) within the areas targeted by this assay, and inadequate number of viral copies(<138 copies/mL). A negative result must be combined with clinical observations, patient history, and epidemiological information. The expected result is Negative.  Fact Sheet for Patients:  EntrepreneurPulse.com.au  Fact Sheet for Healthcare Providers:  IncredibleEmployment.be  This test is no t yet approved or cleared by the [place] FDA and  has been authorized for detection and/or diagnosis of SARS-CoV-2 by FDA under an Emergency Use Authorization (EUA). This EUA will remain  in effect (meaning this test can be used) for the duration of the COVID-19 declaration under Section 564(b)(1) of the Act, 21 U.S.C.section 360bbb-3(b)(1), unless the authorization is terminated  or revoked sooner.       Influenza A by PCR NEGATIVE NEGATIVE Final   Influenza B by PCR NEGATIVE NEGATIVE Final    Comment: (NOTE) The Xpert Xpress SARS-CoV-2/FLU/RSV plus assay is intended as an aid in the diagnosis of influenza from Nasopharyngeal swab specimens and should not be used as a sole basis for treatment. Nasal washings and aspirates are unacceptable for Xpert Xpress SARS-CoV-2/FLU/RSV testing.  Fact Sheet for Patients: EntrepreneurPulse.com.au  Fact Sheet for Healthcare Providers: IncredibleEmployment.be  This test is not yet approved or cleared by the [place] FDA and has been authorized for detection and/or diagnosis of SARS-CoV-2 by FDA under an Emergency Use Authorization (EUA). This EUA will remain in effect (meaning this test can be used) for the duration of the COVID-19 declaration  under Section 564(b)(1) of the Act, 21 U.S.C. section 360bbb-3(b)(1), unless the authorization is terminated or revoked.  Performed at [facility], [place] [address] [place], [place] [address]   MRSA Next Gen by PCR, Nasal     Status: Abnormal   Collection Time: 01/16/22 12:47 AM   Specimen: Nasal Mucosa; Nasal Swab  Result Value Ref Range Status   MRSA by PCR Next Gen DETECTED (A) NOT DETECTED Final    Comment: (NOTE) The GeneXpert MRSA Assay (FDA approved for NASAL specimens only), is one component of a comprehensive MRSA colonization surveillance program. It is not intended to diagnose MRSA infection nor to guide or monitor treatment for MRSA infections. Test performance is not FDA approved in patients less than 68 years old. Performed at [facility], [place] [address], [place], [place] [address]   Aerobic/Anaerobic Culture w Gram Stain (surgical/deep wound)     Status: None   Collection Time: 01/17/22  2:52 PM   Specimen: Abscess  Result Value Ref Range Status   Specimen Description   Final    ABSCESS ABSCESS Performed at [facility] [address], [place], [place] [address]    Special Requests   Final  LEFT Performed at [facility], [place] [address] [place], [place] [address]    Gram Stain   Final    NO SQUAMOUS EPITHELIAL CELLS SEEN FEW WBC SEEN NO ORGANISMS SEEN    Culture   Final    RARE METHICILLIN RESISTANT STAPHYLOCOCCUS AUREUS NO ANAEROBES ISOLATED Performed at [facility], [address], [place], [place] [address]    Report Status 01/23/2022 FINAL  Final   Organism ID, Bacteria METHICILLIN RESISTANT STAPHYLOCOCCUS AUREUS  Final      Susceptibility   Methicillin resistant staphylococcus aureus - MIC*    CIPROFLOXACIN >=8 RESISTANT Resistant     ERYTHROMYCIN >=8 RESISTANT Resistant     GENTAMICIN <=0.5 SENSITIVE Sensitive     OXACILLIN >=4 RESISTANT Resistant      TETRACYCLINE <=1 SENSITIVE Sensitive     VANCOMYCIN 1 SENSITIVE Sensitive     TRIMETH/SULFA <=10 SENSITIVE Sensitive     CLINDAMYCIN >=8 RESISTANT Resistant     RIFAMPIN <=0.5 SENSITIVE Sensitive     Inducible Clindamycin NEGATIVE Sensitive     * RARE METHICILLIN RESISTANT STAPHYLOCOCCUS AUREUS  Aerobic/Anaerobic Culture w Gram Stain (surgical/deep wound)     Status: None   Collection Time: 01/18/22  6:41 PM   Specimen: PATH Other; Tissue  Result Value Ref Range Status   Specimen Description SHOULDER JOINT FLUID  Final   Special Requests SWAB A  Final   Gram Stain   Final    NO SQUAMOUS EPITHELIAL CELLS SEEN FEW WBC SEEN NO ORGANISMS SEEN    Culture   Final    No growth aerobically or anaerobically. Performed at [facility], [place] [address] [place], [place] [address]    Report Status 01/24/2022 FINAL  Final  Aerobic/Anaerobic Culture w Gram Stain (surgical/deep wound)     Status: None   Collection Time: 01/18/22  6:46 PM   Specimen: PATH Other; Tissue  Result Value Ref Range Status   Specimen Description SHOULDER LEFT JOINT FLUID  Final   Special Requests SWAB B  Final   Gram Stain   Final    NO SQUAMOUS EPITHELIAL CELLS SEEN FEW WBC SEEN NO ORGANISMS SEEN    Culture   Final    No growth aerobically or anaerobically. Performed at [facility], [place] [name], [place], [place] [address]    Report Status 01/24/2022 FINAL  Final  Aerobic/Anaerobic Culture w Gram Stain (surgical/deep wound)     Status: None   Collection Time: 01/18/22  6:46 PM   Specimen: PATH Other; Tissue  Result Value Ref Range Status   Specimen Description SHOULDER LEFT JOINT FLUID  Final   Special Requests SWAB C  Final   Gram Stain   Final    NO SQUAMOUS EPITHELIAL CELLS SEEN FEW WBC SEEN NO ORGANISMS SEEN    Culture   Final    No growth aerobically or anaerobically. Performed at [facility], [place] [address], [place],  [address]    Report Status 01/24/2022 FINAL  Final     Pertinent Lab. CBC Latest Ref Rng & Units 01/25/2022 01/22/2022 01/21/2022  WBC 4.0 - 10.5 K/uL 5.3 - 8.4  Hemoglobin 13.0 - 17.0 g/dL 7.8(L) 7.9(L) 8.3(L)  Hematocrit 39.0 - 52.0 % 25.4(L) 26.6(L) 26.9(L)  Platelets 150 - 400 K/uL 350 - 301   CMP Latest Ref Rng & Units 01/25/2022 01/24/2022 01/23/2022  Glucose 70 - 99 mg/dL 126(H) 99 145(H)  BUN 8 - 23 mg/dL 39(H) 40(H) 42(H)  Creatinine 0.61 - 1.24 mg/dL 0.84 1.05 0.99  Sodium 135 - 145 mmol/L 138 138 134(L)  Potassium 3.5 - 5.1 mmol/L 3.4(L) 3.5 4.3  Chloride 98 - 111 mmol/L 105 106 103  CO2 22 - 32 mmol/L '25 25 23  '$ Calcium 8.9 - 10.3 mg/dL 8.5(L) 8.3(L) 8.4(L)  Total Protein 6.5 - 8.1 g/dL - - -  Total Bilirubin 0.3 - 1.2 mg/dL - - -  Alkaline Phos 38 - 126 U/L - - -  AST 15 - 41 U/L - - -  ALT 0 - 44 U/L - - -     Pertinent Imaging today Plain films and CT images have been personally visualized and interpreted; radiology reports have been reviewed. Decision making incorporated into the Impression / Recommendations.  No results found.  I spent more than 35 minutes for this patient encounter including review of prior medical records, coordination of care with primary/other specialist with greater than 50% of time being face to face/counseling and discussing diagnostics/treatment plan with the patient/family.  Electronically signed by:   Rosiland Oz, MD Infectious Disease Physician [facility] for Infectious Disease Pager: [phone]

## 2022-01-25 NOTE — Care Management Important Message (Signed)
Important Message ? ?Patient Details  ?Name: Jeremiah [name] ?MRN: [id number] ?Date of Birth: Jan 17, 1951 ? ? ?Medicare Important Message Given:  Other (see comment) ? ? ? ? ?[name]  [name] ?01/25/2022, 3:45 PM ?

## 2022-01-25 NOTE — Progress Notes (Signed)
Occupational Therapy Treatment ?Patient Details ?Name: Jeremiah [name] ?MRN: [id number] ?DOB: April 27, 1951 ?Today's Date: 01/25/2022 ? ? ?History of present illness Pt is a 71 y/o M presenting to ED on 2/25 wtih L shoulder wound with pain radiating to chest. Found to have L shoulder anteromedial dislocation after fall 3 months ago. S/p removal of reverse shoulder implant with antibiotic spacer on 2/28. PMH includes CAD s/p bypass, hx of PE and DVT on eliquis, HTN and alcohol abuse. ?  ?OT comments ? Pt progressing towards goals this session, completing toileting/pericare with set up - min guard, max A for UB dressing. Min guard for bed mobility, and supervision for transfers with straight cane. Reiterated education on WB status and movement precautions for pt. Pt able to complete AROM/AAROM/PROM of hand, wrist, and elbow during session, reporting pain with elbow flexion, however says it is a "good pain". Pt verbalizes understanding, but will benefit from reinforcement of exercises. Pt presenting with impairments listed below, will continue to follow acutely.   ? ?Recommendations for follow up therapy are one component of a multi-disciplinary discharge planning process, led by the attending physician.  Recommendations may be updated based on patient status, additional functional criteria and insurance authorization. ?   ?Follow Up Recommendations ? Follow physician's recommendations for discharge plan and follow up therapies  ?  ?Assistance Recommended at Discharge Intermittent Supervision/Assistance  ?Patient can return home with the following ? A little help with walking and/or transfers;A lot of help with bathing/dressing/bathroom;Assistance with cooking/housework ?  ?Equipment Recommendations ? None recommended by OT;Other (comment)  ?  ?Recommendations for Other Services   ? ?  ?Precautions / Restrictions Precautions ?Precautions: Fall ?Precaution Comments: L shoulder wound vac ?Required Braces or Orthoses:  Sling ?Restrictions ?Weight Bearing Restrictions: Yes ?LUE Weight Bearing: Non weight bearing ?Other Position/Activity Restrictions: sling on at all times except for dressing/exercise, AROM of hand/wrist/elbow ok, no shoulder AROM/PROM per MD  ? ? ?  ? ?Mobility Bed Mobility ?Overal bed mobility: Modified Independent ?Bed Mobility: Supine to Sit ?  ?Sidelying to sit: Min guard ?  ?  ?  ?  ?  ? ?Transfers ?Overall transfer level: Needs assistance ?Equipment used: Straight cane ?Transfers: Sit to/from Stand ?Sit to Stand: Supervision ?  ?  ?  ?  ?  ?  ?  ?  ?Balance Overall balance assessment: Needs assistance ?Sitting-balance support: Single extremity supported, Feet supported ?Sitting balance-Leahy Scale: Good ?  ?  ?Standing balance support: Single extremity supported ?Standing balance-Leahy Scale: Fair ?Standing balance comment: pt required min guard for dynamic standing balance ?  ?  ?  ?  ?  ?  ?  ?  ?  ?  ?  ?   ? ?ADL either performed or assessed with clinical judgement  ? ?ADL Overall ADL's : Needs assistance/impaired ?  ?  ?Grooming: Wash/dry hands;Standing;Min guard ?Grooming Details (indicate cue type and reason): completed standing at sink ?  ?  ?  ?  ?Upper Body Dressing : Maximal assistance;Sitting ?Upper Body Dressing Details (indicate cue type and reason): cues needed to keep shoulder immobilized during gown change ?  ?  ?Toilet Transfer: Min guard;Ambulation;Comfort height toilet;Grab bars ?Toilet Transfer Details (indicate cue type and reason): uses grab bars ?Toileting- Clothing Manipulation and Hygiene: Set up;Sitting/lateral lean ?Toileting - Clothing Manipulation Details (indicate cue type and reason): pericare on commode ?  ?  ?Functional mobility during ADLs: Min guard;Cane ?  ?  ? ?Extremity/Trunk Assessment Upper Extremity Assessment ?Upper Extremity  Assessment: LUE deficits/detail ?LUE Deficits / Details: AROM of hand/wrist WFL, decreased elbow flexion due to pain, unable to assess  shoulder, immobilized ?LUE: Unable to fully assess due to immobilization ?  ?Lower Extremity Assessment ?Lower Extremity Assessment: Defer to PT evaluation ?  ?  ?  ? ?Vision   ?Vision Assessment?: No apparent visual deficits ?  ?Perception Perception ?Perception: Not tested ?  ?Praxis Praxis ?Praxis: Not tested ?  ? ?Cognition Arousal/Alertness: Awake/alert ?Behavior During Therapy: [facility] for tasks assessed/performed ?Overall Cognitive Status: Within Functional Limits for tasks assessed ?  ?  ?  ?  ?  ?  ?  ?  ?  ?  ?  ?  ?  ?  ?  ?  ?General Comments: pt concerned about where his money is going, perseverative on this throughout session ?  ?  ?   ?Exercises Exercises: General Upper Extremity ?General Exercises - Upper Extremity ?Elbow Flexion: Left, 5 reps, Seated, AROM ?Elbow Extension: AROM, Left, 5 reps, Seated ?Wrist Flexion: AROM, Both, 5 reps, Seated ?Wrist Extension: AROM, Left, 5 reps, Seated ?Digit Composite Flexion: AROM, 5 reps, Left, Seated ?Composite Extension: AROM, Left, 5 reps, Seated ? ?  ?Shoulder Instructions   ? ? ?  ?General Comments Reviewed precautions and exercises with pt, pt reporting mild pain with elbow flex/ext, wanting to straighten elbow out  ? ? ?Pertinent Vitals/ Pain       Pain Assessment ?Pain Assessment: Faces ?Pain Score: 7  ?Faces Pain Scale: Hurts even more ?Pain Location: L shoulder ?Pain Descriptors / Indicators: Aching ?Pain Intervention(s): Limited activity within patient's tolerance, Monitored during session, Repositioned, Patient requesting pain meds-RN notified ? ?Home Living   ?  ?  ?  ?  ?  ?  ?  ?  ?  ?  ?  ?  ?  ?  ?  ?  ?  ?  ? ?  ?Prior Functioning/Environment    ?  ?  ?  ?   ? ?Frequency ? Min 3X/week  ? ? ? ? ?  ?Progress Toward Goals ? ?OT Goals(current goals can now be found in the care plan section) ? Progress towards OT goals: Progressing toward goals ? ?Acute Rehab OT Goals ?Patient Stated Goal: to get better ?OT Goal Formulation: With patient ?Time For Goal  Achievement: 02/02/22 ?Potential to Achieve Goals: Good ?ADL Goals ?Pt Will Perform Grooming: with set-up;standing ?Pt Will Perform Upper Body Dressing: with min assist;sitting;standing ?Pt Will Perform Lower Body Dressing: with min assist;sitting/lateral leans;sit to/from stand  ?Plan Discharge plan remains appropriate   ? ?Co-evaluation ? ? ?   ?  ?  ?  ?  ? ?  ?AM-PAC OT "6 Clicks" Daily Activity     ?Outcome Measure ? ? Help from another person eating meals?: None ?Help from another person taking care of personal grooming?: A Little ?Help from another person toileting, which includes using toliet, bedpan, or urinal?: A Little ?Help from another person bathing (including washing, rinsing, drying)?: A Lot ?Help from another person to put on and taking off regular upper body clothing?: A Lot ?Help from another person to put on and taking off regular lower body clothing?: A Lot ?6 Click Score: 16 ? ?  ?End of Session Equipment Utilized During Treatment: Other (comment) (cane) ? ?OT Visit Diagnosis: Unsteadiness on feet (R26.81);Other abnormalities of gait and mobility (R26.89);Muscle weakness (generalized) (M62.81) ?  ?Activity Tolerance Patient tolerated treatment well ?  ?Patient Left in chair;with call bell/phone  within reach;with chair alarm set ?  ?Nurse Communication Mobility status ?  ? ?   ? ?Time: 0071-2197 ?OT Time Calculation (min): 47 min ? ?Charges: OT General Charges ?$OT Visit: 1 Visit ?OT Treatments ?$Self Care/Home Management : 23-37 mins ?$Therapeutic Activity: 8-22 mins ? ?Lynnda Child, OTD, OTR/L ?Acute Rehab ?[phone] ? ? ?Kaylyn Lim ?01/25/2022, 9:51 AM ?

## 2022-01-25 NOTE — TOC Progression Note (Signed)
Transition of Care (TOC) - Progression Note  ? ? ?Patient Details  ?Name: Jeremiah [name] ?MRN: [id number] ?Date of Birth: 12/29/1950 ? ?Transition of Care (TOC) CM/SW Contact  ?Emeterio Reeve, LCSW ?Phone Number: ?01/25/2022, 4:33 PM ? ?Clinical Narrative:    ? ?During [place] meeting with leadership it was suggested that pt be faxed out to [facility] for IV ABX. Csw faxed referral and is waiting on reply. ? ?Expected Discharge Plan: [place] ?Barriers to Discharge: Continued Medical Work up ? ?Expected Discharge Plan and Services ?Expected Discharge Plan: [place] ?  ?Discharge Planning Services: CM Consult ?  ?Living arrangements for the past 2 months: [place] ?                ?  ?  ?  ?  ?  ?  ?  ?  ?  ?  ? ? ?Social Determinants of Health (SDOH) Interventions ?  ? ?Readmission Risk Interventions ?No flowsheet data found. ? ?

## 2022-01-25 NOTE — Progress Notes (Signed)
Physical Therapy Treatment ?Patient Details ?Name: Jeremiah [name] ?MRN: [id number] ?DOB: [birth year] ?Today's Date: 01/25/2022 ? ? ?History of Present Illness Pt is a 71 y/o M presenting to ED on 2/25 wtih L shoulder wound with pain radiating to chest. Found to have L shoulder anteromedial dislocation after fall 3 months ago. S/p removal of reverse shoulder implant with antibiotic spacer on 2/28. PMH includes CAD s/p bypass, hx of PE and DVT on eliquis, HTN and alcohol abuse. ? ?  ?PT Comments  ? ? Pt continues to make good progress toward goals with much increased tolerance for gait training. Pt continues to require min guard throughout ambulation for general unsteadiness, most noted when turning. Noted crepitus throughout LLE during ambulation. Pt wit good adherence of WB precautions. Pt continues to benefit from skilled PT services to progress toward functional mobility goals.  ?  ?Recommendations for follow up therapy are one component of a multi-disciplinary discharge planning process, led by the attending physician.  Recommendations may be updated based on patient status, additional functional criteria and insurance authorization. ? ?Follow Up Recommendations ? Home health PT ?  ?  ?Assistance Recommended at Discharge Intermittent Supervision/Assistance  ?Patient can return home with the following A little help with walking and/or transfers;A little help with bathing/dressing/bathroom;Assist for transportation;Help with stairs or ramp for entrance ?  ?Equipment Recommendations ? Cane  ?  ?Recommendations for Other Services   ? ? ?  ?Precautions / Restrictions Precautions ?Precautions: Fall ?Precaution Comments: L shoulder wound vac ?Required Braces or Orthoses: Sling ?Restrictions ?Weight Bearing Restrictions: Yes ?LUE Weight Bearing: Non weight bearing ?Other Position/Activity Restrictions: sling on at all times except for dressing/exercise, AROM of hand/wrist/elbow ok, no shoulder AROM/PROM per MD  ?   ? ?Mobility ? Bed Mobility ?  ?  ?  ?  ?  ?  ?  ?General bed mobility comments: OOB in recliner upon arrival ?  ? ?Transfers ?Overall transfer level: Needs assistance ?Equipment used: Straight cane ?Transfers: Sit to/from Stand ?Sit to Stand: Supervision ?  ?  ?  ?  ?  ?General transfer comment: good compliance with WB to power up to standing with no AD and no cues needed ?  ? ?Ambulation/Gait ?Ambulation/Gait assistance: Min guard ?Gait Distance (Feet): 300 Feet ?Assistive device: Straight cane ?Gait Pattern/deviations: Step-through pattern, Decreased step length - right, Decreased step length - left, Decreased stride length, Wide base of support ?Gait velocity: decreased ?  ?  ?General Gait Details: generalized unsteadiness upon standing, specifially when turning, wide base of support, can correct but states it feels unsteady ? ? ?Stairs ?  ?  ?  ?  ?  ? ? ?Wheelchair Mobility ?  ? ?Modified Rankin (Stroke Patients Only) ?  ? ? ?  ?Balance Overall balance assessment: Needs assistance ?Sitting-balance support: Single extremity supported, Feet supported ?Sitting balance-Leahy Scale: Good ?  ?  ?Standing balance support: Single extremity supported ?Standing balance-Leahy Scale: Fair ?Standing balance comment: pt required min guard for dynamic standing balance ?  ?  ?  ?  ?  ?  ?  ?  ?  ?  ?  ?  ? ?  ?Cognition Arousal/Alertness: Awake/alert ?Behavior During Therapy: [facility] for tasks assessed/performed ?Overall Cognitive Status: Within Functional Limits for tasks assessed ?  ?  ?  ?  ?  ?  ?  ?  ?  ?  ?  ?  ?  ?  ?  ?  ?General Comments: pt concerned about where  his money is going, perseverative on this throughout session ?  ?  ? ?  ?Exercises General Exercises - Lower Extremity ?Toe Raises: AROM, Both, 10 reps, Standing ?Heel Raises: AROM, Both, 10 reps, Standing ?Other Exercises ?Other Exercises: sustained PF stretch of LLE x30 secs x2 ? ?  ?General Comments   ?  ?  ? ?Pertinent Vitals/Pain Pain Assessment ?Faces Pain  Scale: Hurts even more ?Pain Location: L shoulder ?Pain Descriptors / Indicators: Aching  ? ? ?Home Living   ?  ?  ?  ?  ?  ?  ?  ?  ?  ?   ?  ?Prior Function    ?  ?  ?   ? ?PT Goals (current goals can now be found in the care plan section) Acute Rehab PT Goals ?PT Goal Formulation: With patient ?Time For Goal Achievement: 02/02/22 ?Potential to Achieve Goals: Fair ? ?  ?Frequency ? ? ? Min 5X/week ? ? ? ?  ?PT Plan    ? ? ?Co-evaluation   ?  ?  ?  ?  ? ?  ?AM-PAC PT "6 Clicks" Mobility   ?Outcome Measure ? Help needed turning from your back to your side while in a flat bed without using bedrails?: None ?Help needed moving from lying on your back to sitting on the side of a flat bed without using bedrails?: A Little ?Help needed moving to and from a bed to a chair (including a wheelchair)?: A Little ?Help needed standing up from a chair using your arms (e.g., wheelchair or bedside chair)?: A Little ?Help needed to walk in hospital room?: A Little ?Help needed climbing 3-5 steps with a railing? : A Little ?6 Click Score: 19 ? ?  ?End of Session Equipment Utilized During Treatment: Gait belt ?Activity Tolerance: Patient tolerated treatment well ?Patient left: in chair;with call bell/phone within reach;with chair alarm set ?Nurse Communication: Mobility status ?PT Visit Diagnosis: Unsteadiness on feet (R26.81);Muscle weakness (generalized) (M62.81);Pain ?Pain - Right/Left: Left ?Pain - part of body: Arm;Shoulder ?  ? ? ?Time: 2563-8937 ?PT Time Calculation (min) (ACUTE ONLY): 21 min ? ?Charges:  $Gait Training: 8-22 mins          ?          ?[name]. PTA ?Acute Rehabilitation Services ?Office: [phone] ? ? ? ?Betsey Holiday Travaris Kosh ?01/25/2022, 4:38 PM ? ?

## 2022-01-25 NOTE — Progress Notes (Signed)
Mobility Specialist Progress Note: ? ? 01/25/22 1150  ?Mobility  ?Activity Ambulated with assistance in hallway  ?Level of Assistance Standby assist, set-up cues, supervision of patient - no hands on  ?Assistive Device [place]  ?Distance Ambulated (ft) 300 ft  ?Activity Response Tolerated well  ?$Mobility charge 1 Mobility  ? ?Pt received in chair willing to participate in mobility. Complaints of 8/10 shoulder pain. Left in chair with call bell in reach and all needs met.  ? ?Jeremiah [name] ?Mobility Specialist ?Primary Phone [phone] ? ?

## 2022-01-25 NOTE — Progress Notes (Addendum)
PROGRESS NOTE    Jeremiah [name]  WPY:[id number] DOB: 1951/07/16 DOA: 01/15/2022 PCP: Carol Ada, MD    Brief Narrative:  Patient is a 71 y.o. male with medical history significant of CAD s/p bypass in 1998, Hx of PE and DVT on Eliquis, HTN, alcohol abuse with hx of withdrawal, opioid dependency previous on Suboxone presented to the hospital with left shoulder and neck pain.  Reportedly patient lost his wife last year and moved out to [place] to live with his brother. However did not get along with his sister-in-law and has recently moved back here to [place] l a week prior to presentation and noted to be homeless and living in [place]. Noted to have multiple admissions recently in [place] for alcohol withdrawal.  On 10/2021 he was admitted following a fall and was noted to have a large left-sided chest wall hematoma  had multiple I&D and wound VAC in January due to persistent refractory hematoma, found to have chronically dislocated left shoulder arthroplasty in the setting of likely infection.  On 02/06/2021-CTA showed large burden of thromboembolic disease with central pulmonary artery distribution.  Saw Pulm in office with Dr Janett Billow at [facility]- who noted 'it sounds like his pulmonary embolism was associated with some lower extremity cellulitis which he was hospitalized and minimally mobile. He was treated with Eliquis, now off.  In the ED on this admission patient was hemodynamically stable.  Labs showed some mild anemia of hemoglobin 9.1.  EKG showed atrial flutter.  Troponin was borderline.  Left shoulder x-ray showed anterior dislocation of left reverse total shoulder arthroplasty.  Patient had CTA chest on 01/16/2022 which showed no evidence of pulmonary embolism but pulmonary hypertension and cardiomegaly.  There was note of 4.7 cm thoracic aortic aneurysm.  Radiology recommended dysemia [place] CT or MRI follow-up and referral to cardiothoracic surgery as outpatient.   Patient was also seen by orthopedics during hospitalization  and the  CT of left shoulder-showed postsurgical changes and dislocation, large periprosthetic fluid collection surrounding the proximal humerus and extending throughout the left axillary region up to 7.8 X4.4X 8.8-sequelae of postoperative seroma, hematoma versus particle disease.  Patient had 2D echocardiogram on 01/16/2022 which showed reduced LV function and cardiology was consulted.  On 01/17/2022 CT-guided biopsy of the left anterior axillary fluid was done with removal of 30 mils of cloudy fluid with.  Patient underwent removal of shoulder implant with excision of sinus tract antibiotic spacer placement and wound VAC placement by Dr. Marlou Sa on 01/18/2022.  On 01/19/2022 infectious disease was consulted who recommended protected IV antibiotic course. Patient has homelessness issues which has been a barrier for his disposition as well.      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 CT of the left shoulder with fluid collection and underwent CT-guided biopsy on 01/17/2022 with removal of 30 mill of fluid status post removal of hardware, irrigation/debridement and placement of antibiotic spacer with 6-8 weeks of IV antibiotic.  ID on board.  Orthopedics planning to remove wound VAC 01/25/2022.  Acute on chronic combined systolic and diastolic CHF (congestive heart failure) (HCC) Acute on chronic systolic CHF with EF 35 to 40% Moderate to severe aortic regurgitation on prior echo now mild Aortic root 4.27 cm Medical nonadherence  Lower leg edema: Patient was supposed to be on Bumex which he ran out for several months.  Continue CHF protocol.  Seen by  cardiology during this hospitalization.  Net IO Since Admission: -5,855 mL [01/18/22 0955]  On oral lasix daily low dose.  Alcohol abuse No active signs of withdrawal at this time.  Patient is homeless.  CAD (coronary artery disease) CAD s/p cabg x4 1998 Chronic trifascicular block Hyperlipidemia Continue aspirin and statins.  Hold AV nodal blockers.   Hypokalemia Mild.  We will replenish orally.  Check levels in a.m.  Thoracic aortic aneurysm (TAA) CTA incidentally showed a 4.7 cm thoracic aortic aneurysm, will need semiannual CT or MRI imaging/cardiothoracic surgery follow-up as outpatient  Hypotension Improved at this time.  Not on nodal blockers.   B12 deficiency Continue replacement.  History of pulmonary embolism Completed treatment with Eliquis.  Not currently on anticoagulation due to risk of falls homelessness, history of hematoma, ongoing alcohol abuse.  Depression Continue hydroxyzine, Wellbutrin, buspirone.  Normocytic anemia Latest hemoglobin was 7.9.  We will continue to monitor.  Shoulder dislocation, recurrent, left See above.   Atrial flutter ([place]) New onset a flutter in the ED Asymptomatic bradycardia during the night 2/26: TSH was within normal limit.  Patient did have pause for 2.90 seconds.  Cardiology was consulted.  Avoid nodal blockers.  Continue anticoagulation     DVT prophylaxis: enoxaparin (LOVENOX) injection 40 mg Start: 01/19/22 1400 SCD's Start: 01/18/22 2238 Place TED hose Start: 01/18/22 2238 SCDs Start: 01/15/22 2347   Code Status:     Code Status: Full Code  Disposition: Patient is currently homeless, pending arrangement for antibiotic and housing.  Status is: Inpatient  Remains inpatient appropriate because: Pending clinical improvement, need for IV antibiotics, pending disposition plan   Family Communication: Spoke with the patient at bedside.  Consultants:  Orthopedics Cardiology  Procedures:  CT-guided aspiration  on 01/17/2022  Antimicrobials:  Vancomycin IV  Anti-infectives (From admission, onward)    Start     Dose/Rate Route Frequency Ordered Stop   01/19/22 1030  vancomycin (VANCOREADY) IVPB 750 mg/150 mL        750 mg 150 mL/hr over 60 Minutes Intravenous Every 12 hours 01/19/22 1027     01/18/22 1925  vancomycin (VANCOCIN) powder  Status:  Discontinued          As needed 01/18/22 1925 01/18/22 2103   01/16/22 2200  vancomycin (VANCOREADY) IVPB 1250 mg/250 mL  Status:  Discontinued        1,250 mg 166.7 mL/hr over 90 Minutes Intravenous Every 24 hours 01/16/22 0107 01/19/22 1027   01/16/22 0030  vancomycin (VANCOREADY) IVPB 1500 mg/300 mL        1,500 mg 150 mL/hr over 120 Minutes Intravenous  Once 01/16/22 0018 01/16/22 0240   01/16/22 0015  vancomycin (VANCOREADY) IVPB 2000 mg/400 mL  Status:  Discontinued        2,000 mg 200 mL/hr over 120 Minutes Intravenous  Once 01/16/22 0001 01/16/22 0018       Subjective: Today, patient was seen and examined at bedside.  Complains of pain in his shoulder not really improved with the Percocet every 6 hourly.  Denies any nausea vomiting fever chills or rigor.    Objective: Vitals:   01/24/22 1851 01/24/22 2124 01/25/22 0319 01/25/22 0754  BP: (!) 99/52 111/62 109/62 (!) 109/51  Pulse: 92 88 (!) 40 82  Resp: '18 18 18 18  '$ Temp: 98.5 F (36.9 C) 98.4 F (36.9  C) 98.1 F (36.7 C) 98.2 F (36.8 C)  TempSrc: Oral Oral Oral   SpO2: 100% 98% 97% 100%  Weight:      Height:        Intake/Output Summary (Last 24 hours) at 01/25/2022 1110 Last data filed at 01/25/2022 0924 Gross per 24 hour  Intake 1500.13 ml  Output 2225 ml  Net -724.87 ml   Filed Weights   01/21/22 0435 01/23/22 0500 01/24/22 0453  Weight: 80.8 kg 86.4 kg 85.8 kg   Body mass index is 29.62 kg/m.   Physical Examination:  General:  Average built, not in obvious distress HENT:   No scleral pallor or icterus noted. Oral mucosa is moist.  Chest:  Clear breath sounds.   Diminished breath sounds bilaterally. No crackles or wheezes.  CVS: S1 &S2 heard. No murmur.  Regular rate and rhythm. Abdomen: Soft, nontender, nondistended.  Bowel sounds are heard.   Extremities: No cyanosis, clubbing or edema.  Peripheral pulses are palpable.  Left shoulder in the sling, wound VAC left shoulder. Psych: Alert, awake and oriented, normal mood CNS:  No cranial nerve deficits.  Power equal in all extremities.   Skin: Warm and dry.  Left shoulder wound VAC.  Data Reviewed:   CBC: Recent Labs  Lab 01/18/22 2246 01/19/22 0328 01/20/22 0433 01/21/22 0105 01/22/22 0047 01/25/22 0546  WBC 10.0 9.3 6.2 8.4  --  5.3  HGB 9.1* 9.1* 8.6* 8.3* 7.9* 7.8*  HCT 31.1* 30.0* 28.4* 26.9* 26.6* 25.4*  MCV 88.9 87.2 87.9 86.5  --  85.5  PLT 275 309 239 301  --  641    Basic Metabolic Panel: Recent Labs  Lab 01/21/22 0105 01/22/22 0047 01/23/22 0032 01/24/22 0117 01/25/22 0546  NA 135 136 134* 138 138  K 3.5 3.7 4.3 3.5 3.4*  CL 104 102 103 106 105  CO2 '23 23 23 25 25  '$ GLUCOSE 103* 133* 145* 99 126*  BUN 40* 46* 42* 40* 39*  CREATININE 1.10 1.01 0.99 1.05 0.84  CALCIUM 8.0* 8.2* 8.4* 8.3* 8.5*  MG 1.7 2.0 1.9 1.9 1.8    Liver Function Tests: No results for input(s): AST, ALT, ALKPHOS, BILITOT, PROT, ALBUMIN in the last 168 hours.   Radiology Studies: No results found.    LOS: 10 days    Flora Lipps, MD [facility] 01/25/2022, 11:10 AM

## 2022-01-25 NOTE — Progress Notes (Signed)
3/6 IM Letter mailed to patient's address due to isolation status. ?

## 2022-01-25 NOTE — Progress Notes (Signed)
Patient evaluated.  He is stable from the standpoint of his left shoulder.  Wound VAC was removed from the incision site and the incision looks to be healing well.  Skin edges are well approximated with no surrounding erythema or any expressible drainage.  The incision was recovered with an Aquacel dressing which will be removed in about 1 week's time.  He continues to deny any fevers, chills, night sweats, drainage, chest pain, shortness of breath.  Plan is continue with IV antibiotics and continue working out disposition per case management.  He is concerned about where he will end up.  He does state that he should be able to afford an apartment eventually but cannot do so right now.  He also has no means of transportation, having left his truck several states away. ?

## 2022-01-26 ENCOUNTER — Inpatient Hospital Stay: Payer: Self-pay

## 2022-01-26 ENCOUNTER — Inpatient Hospital Stay (HOSPITAL_COMMUNITY): Payer: Medicare Other

## 2022-01-26 DIAGNOSIS — M549 Dorsalgia, unspecified: Secondary | ICD-10-CM | POA: Diagnosis present

## 2022-01-26 DIAGNOSIS — F1010 Alcohol abuse, uncomplicated: Secondary | ICD-10-CM | POA: Diagnosis not present

## 2022-01-26 DIAGNOSIS — S41002A Unspecified open wound of left shoulder, initial encounter: Secondary | ICD-10-CM | POA: Diagnosis not present

## 2022-01-26 DIAGNOSIS — I5043 Acute on chronic combined systolic (congestive) and diastolic (congestive) heart failure: Secondary | ICD-10-CM | POA: Diagnosis not present

## 2022-01-26 DIAGNOSIS — I483 Typical atrial flutter: Secondary | ICD-10-CM | POA: Diagnosis not present

## 2022-01-26 LAB — BASIC METABOLIC PANEL
Anion gap: 7 (ref 5–15)
BUN: 46 mg/dL — ABNORMAL HIGH (ref 8–23)
CO2: 23 mmol/L (ref 22–32)
Calcium: 8 mg/dL — ABNORMAL LOW (ref 8.9–10.3)
Chloride: 103 mmol/L (ref 98–111)
Creatinine, Ser: 0.88 mg/dL (ref 0.61–1.24)
GFR, Estimated: >60 mL/min (ref >60)
Glucose, Bld: 109 mg/dL — ABNORMAL HIGH (ref 70–99)
Potassium: 4.1 mmol/L (ref 3.5–5.1)
Sodium: 133 mmol/L — ABNORMAL LOW (ref 135–145)

## 2022-01-26 LAB — CBC
HCT: 22.5 % — ABNORMAL LOW (ref 39.0–52.0)
Hemoglobin: 6.8 g/dL — CL (ref 13.0–17.0)
MCH: 26 pg (ref 26.0–34.0)
MCHC: 30.2 g/dL (ref 30.0–36.0)
MCV: 85.9 fL (ref 80.0–100.0)
Platelets: 341 10*3/uL (ref 150–400)
RBC: 2.62 MIL/uL — ABNORMAL LOW (ref 4.22–5.81)
RDW: 17.1 % — ABNORMAL HIGH (ref 11.5–15.5)
WBC: 5.3 10*3/uL (ref 4.0–10.5)
nRBC: 0 % (ref 0.0–0.2)

## 2022-01-26 LAB — HEMOGLOBIN AND HEMATOCRIT, BLOOD
HCT: 25.1 % — ABNORMAL LOW (ref 39.0–52.0)
Hemoglobin: 7.9 g/dL — ABNORMAL LOW (ref 13.0–17.0)

## 2022-01-26 LAB — PREPARE RBC (CROSSMATCH)

## 2022-01-26 LAB — MAGNESIUM: Magnesium: 1.9 mg/dL (ref 1.7–2.4)

## 2022-01-26 MED ORDER — SODIUM CHLORIDE 0.9% IV SOLUTION: Freq: Once | INTRAVENOUS | Status: DC

## 2022-01-26 MED ORDER — LIDOCAINE 5 % EX PTCH
1.0000 | MEDICATED_PATCH | CUTANEOUS | Status: DC
Start: 2022-01-26 — End: 2022-03-08
  Administered 2022-01-26 – 2022-03-08 (×42): 1 via TRANSDERMAL
  Filled 2022-01-26 (×43): qty 1

## 2022-01-26 MED ORDER — CHLORHEXIDINE GLUCONATE CLOTH 2 % EX PADS
6.0000 | MEDICATED_PAD | Freq: Every day | CUTANEOUS | Status: DC
  Administered 2022-01-27 – 2022-03-04 (×37): 6 via TOPICAL

## 2022-01-26 MED ORDER — SODIUM CHLORIDE 0.9% FLUSH
10.0000 mL | INTRAVENOUS | Status: DC | PRN
  Administered 2022-01-30 – 2022-02-12 (×2): 10 mL

## 2022-01-26 NOTE — Progress Notes (Signed)
Date and time results received: 01/26/22 at [facility] ? ? ?Test: Hemoglobin  ?Critical Value: 6.8 ? ?Name of Provider Notified: Flora Lipps MD ? ?No orders at this time, MD aware  ? ?

## 2022-01-26 NOTE — Plan of Care (Signed)
?  Problem: Activity: ?Goal: Capacity to carry out activities will improve ?Outcome: Progressing ?  ?Problem: Education: ?Goal: Knowledge of General Education information will improve ?Description: Including pain rating scale, medication(s)/side effects and non-pharmacologic comfort measures ?Outcome: Progressing ?  ?Problem: Activity: ?Goal: Risk for activity intolerance will decrease ?Outcome: Progressing ?  ?Problem: Nutrition: ?Goal: Adequate nutrition will be maintained ?Outcome: Progressing ?  ?Problem: Coping: ?Goal: Level of anxiety will decrease ?Outcome: Progressing ?  ?Problem: Elimination: ?Goal: Will not experience complications related to bowel motility ?Outcome: Progressing ?  ?Problem: Elimination: ?Goal: Will not experience complications related to urinary retention ?Outcome: Progressing ?  ?Problem: Pain Managment: ?Goal: General experience of comfort will improve ?Outcome: Progressing ?  ?Problem: Safety: ?Goal: Ability to remain free from injury will improve ?Outcome: Progressing ?  ?Problem: Skin Integrity: ?Goal: Risk for impaired skin integrity will decrease ?Outcome: Progressing ?  ?

## 2022-01-26 NOTE — TOC Progression Note (Signed)
Transition of Care (TOC) - Progression Note  ? ? ?Patient Details  ?Name: Jeremiah [name] ?MRN: [id number] ?Date of Birth: 1951-10-25 ? ?Transition of Care (TOC) CM/SW Contact  ?Marilu Favre, RN ?Phone Number: ?01/26/2022, 1:39 PM ? ?Clinical Narrative:    ?Left message for Doroteo Bradford the [facility] team member supervising [facility] [phone] . Await call back  ? Discussed  with [facility] supervisor and director in [employer] 3/7/2.  ? ? ?Expected Discharge Plan: [place] ?Barriers to Discharge: Continued Medical Work up ? ?Expected Discharge Plan and Services ?Expected Discharge Plan: [place] ?  ?Discharge Planning Services: CM Consult ?  ?Living arrangements for the past 2 months: [place] ?                ?  ?  ?  ?  ?  ?  ?  ?  ?  ?  ? ? ?Social Determinants of Health (SDOH) Interventions ?  ? ?Readmission Risk Interventions ?No flowsheet data found. ? ?

## 2022-01-26 NOTE — Progress Notes (Signed)
Physical Therapy Treatment ?Patient Details ?Name: Jeremiah [name] ?MRN: [id number] ?DOB: Oct 17, 1951 ?Today's Date: 01/26/2022 ? ? ?History of Present Illness Pt is a 71 y/o M presenting to ED on 2/25 wtih L shoulder wound with pain radiating to chest. Found to have L shoulder anteromedial dislocation after fall 3 months ago. S/p removal of reverse shoulder implant with antibiotic spacer on 2/28. PMH includes CAD s/p bypass, hx of PE and DVT on eliquis, HTN and alcohol abuse. ? ?  ?PT Comments  ? ? Pt with continued progress towards goals this session with increased tolerance for gait training with pt demonstrating increased stability, especially s/p cane adjustment to proper height needing mingaurd to supervision for safety. Cues for cane proximity with pt able to correct and maintain. Pt with c/o increased L wrist pain, gentle A/AAROM with fair tolerance performed with no increase or decrease in pain, RN notified. Pt continues to benefit from skilled PT services to progress toward functional mobility goals.  ?  ?Recommendations for follow up therapy are one component of a multi-disciplinary discharge planning process, led by the attending physician.  Recommendations may be updated based on patient status, additional functional criteria and insurance authorization. ? ?Follow Up Recommendations ? Home health PT ?  ?  ?Assistance Recommended at Discharge Intermittent Supervision/Assistance  ?Patient can return home with the following A little help with walking and/or transfers;A little help with bathing/dressing/bathroom;Assist for transportation;Help with stairs or ramp for entrance ?  ?Equipment Recommendations ? Cane  ?  ?Recommendations for Other Services   ? ? ?  ?Precautions / Restrictions Precautions ?Precautions: Fall ?Precaution Comments: L shoulder wound vac ?Required Braces or Orthoses: Sling ?Restrictions ?Weight Bearing Restrictions: Yes ?LUE Weight Bearing: Non weight bearing ?Other Position/Activity  Restrictions: sling on at all times except for dressing/exercise, AROM of hand/wrist/elbow ok, no shoulder AROM/PROM per MD  ?  ? ?Mobility ? Bed Mobility ?Overal bed mobility: Modified Independent ?Bed Mobility: Supine to Sit ?  ?  ?Supine to sit: Modified independent (Device/Increase time), Supervision ?  ?  ?  ?  ? ?Transfers ?Overall transfer level: Needs assistance ?Equipment used: Straight cane ?Transfers: Sit to/from Stand ?Sit to Stand: Supervision ?  ?  ?  ?  ?  ?General transfer comment: good compliance with WB to power up to standing with no AD and no cues needed ?  ? ?Ambulation/Gait ?Ambulation/Gait assistance: Min guard, Supervision ?Gait Distance (Feet): 300 Feet (with x1 seated recovery) ?Assistive device: Straight cane ?Gait Pattern/deviations: Step-through pattern, Decreased step length - right, Decreased step length - left, Decreased stride length, Wide base of support ?Gait velocity: decreased ?  ?  ?General Gait Details: generalized unsteadiness upon standing, specifially when turning, wide base of support, can correct but states it feels unsteady ? ? ?Stairs ?  ?  ?  ?  ?  ? ? ?Wheelchair Mobility ?  ? ?Modified Rankin (Stroke Patients Only) ?  ? ? ?  ?Balance Overall balance assessment: Needs assistance ?Sitting-balance support: Single extremity supported, Feet supported ?Sitting balance-Leahy Scale: Good ?  ?  ?Standing balance support: Single extremity supported ?Standing balance-Leahy Scale: Fair ?Standing balance comment: pt required min guard for dynamic standing balance ?  ?  ?  ?  ?  ?  ?  ?  ?  ?  ?  ?  ? ?  ?Cognition Arousal/Alertness: Awake/alert ?Behavior During Therapy: [facility] for tasks assessed/performed ?Overall Cognitive Status: Within Functional Limits for tasks assessed ?  ?  ?  ?  ?  ?  ?  ?  ?  ?  ?  ?  ?  ?  ?  ?  ?  General Comments: pt concerned about getting pain meds, perseverative on this throughout session ?  ?  ? ?  ?Exercises General Exercises - Upper Extremity ?Elbow  Flexion: Left, 5 reps, Seated, AROM, AAROM ?Elbow Extension: AROM, Left, 5 reps, Seated, AAROM ?Wrist Flexion: AROM, Both, 5 reps, Seated ?Wrist Extension: AROM, Left, 5 reps, Seated ?Other Exercises ?Other Exercises: supination/pronation AROM L x10 ? ?  ?General Comments   ?  ?  ? ?Pertinent Vitals/Pain Pain Assessment ?Pain Assessment: Faces ?Faces Pain Scale: Hurts whole lot ?Pain Location: L wrist ?Pain Descriptors / Indicators: Aching ?Pain Intervention(s): Limited activity within patient's tolerance, Monitored during session, Repositioned, Patient requesting pain meds-RN notified  ? ? ?Home Living   ?  ?  ?  ?  ?  ?  ?  ?  ?  ?   ?  ?Prior Function    ?  ?  ?   ? ?PT Goals (current goals can now be found in the care plan section) Acute Rehab PT Goals ?PT Goal Formulation: With patient ?Time For Goal Achievement: 02/02/22 ? ?  ?Frequency ? ? ? Min 5X/week ? ? ? ?  ?PT Plan    ? ? ?Co-evaluation   ?  ?  ?  ?  ? ?  ?AM-PAC PT "6 Clicks" Mobility   ?Outcome Measure ? Help needed turning from your back to your side while in a flat bed without using bedrails?: None ?Help needed moving from lying on your back to sitting on the side of a flat bed without using bedrails?: A Little ?Help needed moving to and from a bed to a chair (including a wheelchair)?: A Little ?Help needed standing up from a chair using your arms (e.g., wheelchair or bedside chair)?: A Little ?Help needed to walk in hospital room?: A Little ?Help needed climbing 3-5 steps with a railing? : A Little ?6 Click Score: 19 ? ?  ?End of Session Equipment Utilized During Treatment: Gait belt ?Activity Tolerance: Patient tolerated treatment well ?Patient left: in bed;with call bell/phone within reach;with nursing/sitter in room ?Nurse Communication: Mobility status;Patient requests pain meds (increased pain in L wrist) ?PT Visit Diagnosis: Unsteadiness on feet (R26.81);Muscle weakness (generalized) (M62.81);Pain ?Pain - Right/Left: Left ?Pain - part of  body: Arm;Shoulder ?  ? ? ?Time: 0947-0962 ?PT Time Calculation (min) (ACUTE ONLY): 21 min ? ?Charges:  $Gait Training: 8-22 mins          ?          ? ?[name]. PTA ?Acute Rehabilitation Services ?Office: [phone] ? ? ? ?Jeremiah [name] ?01/26/2022, 4:19 PM ? ?

## 2022-01-26 NOTE — Progress Notes (Signed)
Mobility Specialist Progress Note: ? ? 01/26/22 1140  ?Mobility  ?Activity Ambulated with assistance in hallway  ?Level of Assistance Standby assist, set-up cues, supervision of patient - no hands on  ?Assistive Device [place]  ?Distance Ambulated (ft) 570 ft  ?Activity Response Tolerated well  ?$Mobility charge 1 Mobility  ? ?Pt received in chair wiling to participate in mobility. Complaints of 7/10 shoulder pain. Left inc hair with call bell in reach and all needs met.  ? ?Jeremiah [name] ?Mobility Specialist ?Primary Phone [phone] ? ?

## 2022-01-26 NOTE — Progress Notes (Signed)
PROGRESS NOTE    Jeremiah [name]  YIF:[id number] DOB: 1951/09/28 DOA: 01/15/2022 PCP: Carol Ada, MD    Brief Narrative:  Patient is a 71 y.o. male with medical history significant of CAD s/p bypass in 1998, Hx of PE and DVT on Eliquis, HTN, alcohol abuse with hx of withdrawal, opioid dependency previous on Suboxone presented to the hospital with left shoulder and neck pain.  Reportedly patient lost his wife last year and moved out to [place] to live with his brother. However did not get along with his sister-in-law and has recently moved back here to [place] l a week prior to presentation and noted to be homeless and living in [place]. Noted to have multiple admissions recently in [place] for alcohol withdrawal.  On 10/2021 he was admitted following a fall and was noted to have a large left-sided chest wall hematoma  had multiple I&D and wound VAC in January due to persistent refractory hematoma, found to have chronically dislocated left shoulder arthroplasty in the setting of likely infection.  On 02/06/2021-CTA showed large burden of thromboembolic disease with central pulmonary artery distribution.  Saw Pulm in office with Dr Janett Billow at [facility]- who noted 'it sounds like his pulmonary embolism was associated with some lower extremity cellulitis which he was hospitalized and minimally mobile. He was treated with Eliquis, now off.  In the ED on this admission patient was hemodynamically stable.  Labs showed some mild anemia of hemoglobin 9.1.  EKG showed atrial flutter.  Troponin was borderline.  Left shoulder x-ray showed anterior dislocation of left reverse total shoulder arthroplasty.  Patient had CTA chest on 01/16/2022 which showed no evidence of pulmonary embolism but pulmonary hypertension and cardiomegaly.  There was note of 4.7 cm thoracic aortic aneurysm.  Radiology recommended dysemia [place] CT or MRI follow-up and referral to cardiothoracic surgery as outpatient.   Patient was also seen by orthopedics during hospitalization  and the  CT of left shoulder-showed postsurgical changes and dislocation, large periprosthetic fluid collection surrounding the proximal humerus and extending throughout the left axillary region up to 7.8 X4.4X 8.8-sequelae of postoperative seroma, hematoma versus particle disease.  Patient had 2D echocardiogram on 01/16/2022 which showed reduced LV function and cardiology was consulted.  On 01/17/2022 CT-guided biopsy of the left anterior axillary fluid was done with removal of 30 mils of cloudy fluid with.  Patient underwent removal of shoulder implant with excision of sinus tract antibiotic spacer placement and wound VAC placement by Dr. Marlou Sa on 01/18/2022.  On 01/19/2022 infectious disease was consulted who recommended prolonged IV antibiotic course. Patient has homelessness issues which has been a barrier for his disposition as well.  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  We will also add a Lidoderm patch.  Patient was advised to continue pain medication regimen that has been prescribed. CT of the left shoulder with fluid collection and underwent CT-guided biopsy on 01/17/2022 with removal of 30 mill of fluid status post removal of hardware, irrigation/debridement and placement of antibiotic spacer with 6-8 weeks of IV antibiotic.  Orthopedics and infectious disease on board.  Further management as per orthopedics/ID.  Acute on chronic combined  systolic and diastolic CHF (congestive heart failure) (HCC) Acute on chronic systolic CHF with EF 35 to 40% Moderate to severe aortic  regurgitation on prior echo now mild Aortic root 4.27 cm Medical nonadherence Lower leg edema: Patient was supposed to be on Bumex which he ran out for several months.  Continue CHF protocol.  Seen by cardiology during this hospitalization.  Net IO Since Admission: -5,855 mL [01/18/22 0955]  On oral lasix daily low dose.  Alcohol abuse No active signs of withdrawal at this time.  Patient is homeless.  CAD (coronary artery disease) CAD s/p cabg x4 1998 Chronic trifascicular block Hyperlipidemia Continue aspirin and statins.  Hold AV nodal blockers.   Hypokalemia Latest potassium of 4.1.  Improved after replacement.  Thoracic aortic aneurysm (TAA) CTA incidentally showed a 4.7 cm thoracic aortic aneurysm, will need semiannual CT or MRI imaging/cardiothoracic surgery follow-up as outpatient  Hypotension Improved at this time.  Not on nodal blockers.   B12 deficiency Continue replacement.  History of pulmonary embolism Completed treatment with Eliquis.  Not currently on anticoagulation due to risk of falls homelessness, history of hematoma, ongoing alcohol abuse.  Depression Continue hydroxyzine, Wellbutrin, buspirone.  Normocytic anemia Latest hemoglobin was 6.8.  We will transfuse 1 unit of packed RBC.  Patient stated that he did have history of blood transfusion in the past.  Consent obtained for transfusion.  Shoulder dislocation, recurrent, left See above.   Atrial flutter ([place]) New onset a flutter in the ED Asymptomatic bradycardia during the night 2/26: TSH was within normal limit.  Patient did have pause for 2.90 seconds.  Cardiology was consulted.  Avoid nodal blockers.  Continue anticoagulation     DVT prophylaxis: enoxaparin (LOVENOX) injection 40 mg Start: 01/19/22 1400 SCD's Start: 01/18/22 2238 Place TED hose Start: 01/18/22 2238 SCDs Start: 01/15/22 2347   Code Status:     Code Status: Full Code  Disposition: Patient is currently homeless, pending  arrangement for antibiotic and housing  Status is: Inpatient  Remains inpatient appropriate because: PRBC transfusion,, need for IV antibiotics, pending disposition plan   Family Communication:  Spoke with the patient at bedside.  Consultants:  Orthopedics Cardiology  Procedures:  CT-guided aspiration on 01/17/2022 Total shoulder arthroplasty with antibiotic spacer placement on 01/18/2022 by Dr. Marlou Sa orthopedics   Antimicrobials:  Vancomycin IV  Anti-infectives (From admission, onward)    Start     Dose/Rate Route Frequency Ordered Stop   01/19/22 1030  vancomycin (VANCOREADY) IVPB 750 mg/150 mL        750 mg 150 mL/hr over 60 Minutes Intravenous Every 12 hours 01/19/22 1027     01/18/22 1925  vancomycin (VANCOCIN) powder  Status:  Discontinued          As needed 01/18/22 1925 01/18/22 2103   01/16/22 2200  vancomycin (VANCOREADY) IVPB 1250 mg/250 mL  Status:  Discontinued        1,250 mg 166.7 mL/hr over 90 Minutes Intravenous Every 24 hours 01/16/22 0107 01/19/22 1027   01/16/22 0030  vancomycin (VANCOREADY) IVPB 1500 mg/300 mL        1,500 mg 150 mL/hr over 120 Minutes Intravenous  Once 01/16/22 0018 01/16/22 0240   01/16/22 0015  vancomycin (VANCOREADY) IVPB 2000 mg/400 mL  Status:  Discontinued        2,000 mg 200 mL/hr over 120 Minutes Intravenous  Once 01/16/22 0001 01/16/22 0018       Subjective: Today, patient was seen and examined at bedside.  Patient is  still complains of pain in the shoulder not adequately controlled.  Denies overt bleeding.  Denies any shortness of breath chest pain palpitation.  Complains of back pain.   Objective: Vitals:   01/25/22 0754 01/25/22 1939 01/26/22 0449 01/26/22 0727  BP: (!) 109/51 106/84 100/65 (!) 124/54  Pulse: 82 (!) 54 74 78  Resp: '18 20 16 16  '$ Temp: 98.2 F (36.8 C) 98.2 F (36.8 C) 98.5 F (36.9 C) 98.6 F (37 C)  TempSrc:  Oral Oral Oral  SpO2: 100% 90% 99% 99%  Weight:      Height:        Intake/Output  Summary (Last 24 hours) at 01/26/2022 1120 Last data filed at 01/26/2022 1045 Gross per 24 hour  Intake 637.77 ml  Output 2200 ml  Net -1562.23 ml   Filed Weights   01/21/22 0435 01/23/22 0500 01/24/22 0453  Weight: 80.8 kg 86.4 kg 85.8 kg   Body mass index is 29.62 kg/m.   Physical Examination:  General:  Average built, not in obvious distress HENT:   Mild pallor noted.  Oral mucosa is moist.  Chest:  Clear breath sounds.  Diminished breath sounds bilaterally. No crackles or wheezes.  CVS: S1 &S2 heard. No murmur.  Regular rate and rhythm. Abdomen: Soft, nontender, nondistended.  Bowel sounds are heard.   Extremities: No cyanosis, clubbing or edema.  Peripheral pulses are palpable.  Left shoulder in sling. Psych: Alert, awake and oriented, normal mood CNS:  No cranial nerve deficits.  Power equal in all extremities.   Skin: Warm and dry.  No rashes noted.  Left shoulder on sling.  Data Reviewed:   CBC: Recent Labs  Lab 01/20/22 0433 01/21/22 0105 01/22/22 0047 01/25/22 0546 01/26/22 0607  WBC 6.2 8.4  --  5.3 5.3  HGB 8.6* 8.3* 7.9* 7.8* 6.8*  HCT 28.4* 26.9* 26.6* 25.4* 22.5*  MCV 87.9 86.5  --  85.5 85.9  PLT 239 301  --  350 093    Basic Metabolic Panel: Recent Labs  Lab 01/22/22 0047 01/23/22 0032 01/24/22 0117 01/25/22 0546 01/26/22 0607  NA 136 134* 138 138 133*  K 3.7 4.3 3.5 3.4* 4.1  CL 102 103 106 105 103  CO2 '23 23 25 25 23  '$ GLUCOSE 133* 145* 99 126* 109*  BUN 46* 42* 40* 39* 46*  CREATININE 1.01 0.99 1.05 0.84 0.88  CALCIUM 8.2* 8.4* 8.3* 8.5* 8.0*  MG 2.0 1.9 1.9 1.8 1.9    Liver Function Tests: No results for input(s): AST, ALT, ALKPHOS, BILITOT, PROT, ALBUMIN in the last 168 hours.   Radiology Studies: No results found.    LOS: 11 days    Flora Lipps, MD [facility] 01/26/2022, 11:20 AM

## 2022-01-26 NOTE — Progress Notes (Signed)
Peripherally Inserted Central Catheter Placement ? ?The IV Nurse has discussed with the patient and/or persons authorized to consent for the patient, the purpose of this procedure and the potential benefits and risks involved with this procedure.  The benefits include less needle sticks, lab draws from the catheter, and the patient may be discharged home with the catheter. Risks include, but not limited to, infection, bleeding, blood clot (thrombus formation), and puncture of an artery; nerve damage and irregular heartbeat and possibility to perform a PICC exchange if needed/ordered by physician.  Alternatives to this procedure were also discussed.  Bard Power PICC patient education guide, fact sheet on infection prevention and patient information card has been provided to patient /or left at bedside.   ? ?PICC Placement Documentation  ?PICC Single Lumen 48/88/91 Right Basilic 37 cm 0 cm (Active)  ?Indication for Insertion or Continuance of Line Poor Vasculature-patient has had multiple peripheral attempts or PIVs lasting less than 24 hours 01/26/22 1459  ?Exposed Catheter (cm) 0 cm 01/26/22 1459  ?Site Assessment Clean, Dry, Intact 01/26/22 1459  ?Line Status Flushed;Blood return noted 01/26/22 1459  ?Dressing Type Transparent 01/26/22 1459  ?Dressing Status Antimicrobial disc in place 01/26/22 1459  ?Dressing Change Due 02/02/22 01/26/22 1459  ? ? ? ? ? ?Valentina Shaggy Ramos ?01/26/2022, 3:01 PM ? ?

## 2022-01-26 NOTE — TOC Progression Note (Signed)
Transition of Care (TOC) - Progression Note  ? ? ?Patient Details  ?Name: Jeremiah [name] ?MRN: [id number] ?Date of Birth: 1951/05/01 ? ?Transition of Care (TOC) CM/SW Contact  ?Emeterio Reeve, LCSW ?Phone Number: ?01/26/2022, 12:25 PM ? ?Clinical Narrative:    ? ?[facility] due to not having stable housing.  ? ?Expected Discharge Plan: [place] ?Barriers to Discharge: Continued Medical Work up ? ?Expected Discharge Plan and Services ?Expected Discharge Plan: [place] ?  ?Discharge Planning Services: CM Consult ?  ?Living arrangements for the past 2 months: [place] ?                ?  ?  ?  ?  ?  ?  ?  ?  ?  ?  ? ? ?Social Determinants of Health (SDOH) Interventions ?  ? ?Readmission Risk Interventions ?No flowsheet data found. ? ?Emeterio Reeve, LCSW ?Clinical Social Worker ? ?

## 2022-01-27 ENCOUNTER — Inpatient Hospital Stay (HOSPITAL_COMMUNITY): Payer: Medicare Other

## 2022-01-27 ENCOUNTER — Inpatient Hospital Stay: Payer: Self-pay

## 2022-01-27 DIAGNOSIS — I5043 Acute on chronic combined systolic (congestive) and diastolic (congestive) heart failure: Secondary | ICD-10-CM | POA: Diagnosis not present

## 2022-01-27 DIAGNOSIS — F1010 Alcohol abuse, uncomplicated: Secondary | ICD-10-CM | POA: Diagnosis not present

## 2022-01-27 DIAGNOSIS — M545 Low back pain, unspecified: Secondary | ICD-10-CM

## 2022-01-27 DIAGNOSIS — G8929 Other chronic pain: Secondary | ICD-10-CM

## 2022-01-27 DIAGNOSIS — I483 Typical atrial flutter: Secondary | ICD-10-CM | POA: Diagnosis not present

## 2022-01-27 DIAGNOSIS — S41002A Unspecified open wound of left shoulder, initial encounter: Secondary | ICD-10-CM | POA: Diagnosis not present

## 2022-01-27 LAB — CBC
HCT: 24.3 % — ABNORMAL LOW (ref 39.0–52.0)
Hemoglobin: 7.4 g/dL — ABNORMAL LOW (ref 13.0–17.0)
MCH: 25.3 pg — ABNORMAL LOW (ref 26.0–34.0)
MCHC: 30.5 g/dL (ref 30.0–36.0)
MCV: 83.2 fL (ref 80.0–100.0)
Platelets: 330 10*3/uL (ref 150–400)
RBC: 2.92 MIL/uL — ABNORMAL LOW (ref 4.22–5.81)
RDW: 17.4 % — ABNORMAL HIGH (ref 11.5–15.5)
WBC: 7.7 10*3/uL (ref 4.0–10.5)
nRBC: 0 % (ref 0.0–0.2)

## 2022-01-27 LAB — BPAM RBC
Blood Product Expiration Date: 202303102359
ISSUE DATE / TIME: 202303081807
Unit Type and Rh: 6200

## 2022-01-27 LAB — TYPE AND SCREEN
ABO/RH(D): A POS
Antibody Screen: NEGATIVE
Unit division: 0

## 2022-01-27 LAB — VANCOMYCIN, PEAK: Vancomycin Pk: 31 ug/mL (ref 30–40)

## 2022-01-27 LAB — URIC ACID: Uric Acid, Serum: 4.4 mg/dL (ref 3.7–8.6)

## 2022-01-27 LAB — VANCOMYCIN, TROUGH: Vancomycin Tr: 20 ug/mL (ref 15–20)

## 2022-01-27 MED ORDER — COLCHICINE 0.6 MG PO TABS
0.6000 mg | ORAL_TABLET | Freq: Two times a day (BID) | ORAL | Status: DC
  Administered 2022-01-27 – 2022-02-01 (×10): 0.6 mg via ORAL
  Filled 2022-01-27 (×10): qty 1

## 2022-01-27 NOTE — Progress Notes (Addendum)
Pharmacy Antibiotic Note ? ?Jeremiah [name] is a 71 y.o. male admitted on 01/15/2022 with MRSA infection of prosthetic shoulder joint.  Pharmacy has been consulted for vancomycin dosing. ? ?D12 vancomycin ?WBC normal, afebrile ?2/27: joint aspiration ?2/28: shoulder implant removal and placement of antibiotic spacer ? ?3/3 VT: 18   VP: 26 ?3/9 VT: 31   VT: 20 on 750 mg IV Q12H >> calculated AUC at steady state = 618 ? ?Plan: ?Adjust to Vancomycin 1250 mg IV every 24 hours ?   > eAUC 515.4, SCr 0.88, Cmax 33.5 ?Monitor renal function, CBC ?Weekly vancomycin levels ? ?Temp (24hrs), Avg:98.5 ?F (36.9 ?C), Min:98 ?F (36.7 ?C), Max:99 ?F (37.2 ?C) ? ?Recent Labs  ?Lab 01/21/22 ?0105 01/21/22 ?2800 01/21/22 ?1421 01/22/22 ?3491 01/23/22 ?0032 01/24/22 ?0117 01/25/22 ?7915 01/26/22 ?0569 01/27/22 ?0216  ?WBC 8.4  --   --   --   --   --  5.3 5.3 7.7  ?CREATININE 1.10  --   --  1.01 0.99 1.05 0.84 0.88  --   ?[place]  --  18  --   --   --   --   --   --   --   ?VANCOPEAK  --   --  26*  --   --   --   --   --  31  ?  ?Estimated Creatinine Clearance: 83.9 mL/min (by C-G formula based on SCr of 0.88 mg/dL).   ? ?Allergies  ?Allergen Reactions  ? Morphine Hives, Swelling and Rash  ?  Has taken with no reaction since original reaction  ? ? ?Antimicrobials this admission: ?Vancomycin 2/26 >> ? - 3/3 VT: 18   VP: 26 - AUC 558, Cm 35 ? - 3/9 VT: 31   VP: 20 ?PTA Doxy 2/14 >>2/25 (not taking?) ?  ? ?Dose adjustments this admission: ?3/10 Vancomycin 750 mg IV Q12H >> 1250 mg IV Q24H for AUC 618 ? ?Microbiology results: ?2/26 MRSA +  ?2/27 abscess: MRSA ?2/28 OR Tissue: NGF ? ?Thank you for allowing pharmacy to be a part of this patient?s care. ?Laurey Arrow, PharmD ?PGY1 Pharmacy Resident ?01/28/2022  10:10 AM ? ?Please check [email or web address] for unit-specific pharmacy phone numbers. ? ?

## 2022-01-27 NOTE — Progress Notes (Signed)
Called to the room by mobility Tech patient had ripped PICC line out taken tele off as well as gown and sling.  ? ?Patient seemed to be confused but was able to re-orient patient.  ?

## 2022-01-27 NOTE — Progress Notes (Signed)
PROGRESS NOTE    Jeremiah [name]  KCL:[id number] DOB: 1950/12/09 DOA: 01/15/2022 PCP: Carol Ada, MD    Brief Narrative:  Patient is a 71 y.o. male with medical history significant of CAD s/p bypass in 1998, Hx of PE and DVT on Eliquis, HTN, alcohol abuse with hx of withdrawal, opioid dependency previous on Suboxone presented to the hospital with left shoulder and neck pain.  Reportedly patient lost his wife last year and moved out to [place] to live with his brother. However did not get along with his sister-in-law and has recently moved back here to [place] l a week prior to presentation and noted to be homeless and living in [place]. Noted to have multiple admissions recently in [place] for alcohol withdrawal.  On 10/2021 he was admitted following a fall and was noted to have a large left-sided chest wall hematoma  had multiple I&D and wound VAC in January due to persistent refractory hematoma, found to have chronically dislocated left shoulder arthroplasty in the setting of likely infection.  On 02/06/2021-CTA showed large burden of thromboembolic disease with central pulmonary artery distribution.  Saw Pulm in office with Dr Janett Billow at [facility]- who noted 'it sounds like his pulmonary embolism was associated with some lower extremity cellulitis which he was hospitalized and minimally mobile. He was treated with Eliquis, now off.  In the ED on this admission patient was hemodynamically stable.  Labs showed some mild anemia of hemoglobin 9.1.  EKG showed atrial flutter.  Troponin was borderline.  Left shoulder x-ray showed anterior dislocation of left reverse total shoulder arthroplasty.  Patient had CTA chest on 01/16/2022 which showed no evidence of pulmonary embolism but pulmonary hypertension and cardiomegaly.  There was note of 4.7 cm thoracic aortic aneurysm.  Radiology recommended dysemia [place] CT or MRI follow-up and referral to cardiothoracic surgery as outpatient.   Patient was also seen by orthopedics during hospitalization  and the  CT of left shoulder-showed postsurgical changes and dislocation, large periprosthetic fluid collection surrounding the proximal humerus and extending throughout the left axillary region up to 7.8 X4.4X 8.8-sequelae of postoperative seroma, hematoma versus particle disease.  Patient had 2D echocardiogram on 01/16/2022 which showed reduced LV function and cardiology was consulted.  On 01/17/2022 CT-guided biopsy of the left anterior axillary fluid was done with removal of 30 mils of cloudy fluid with.  Patient underwent removal of shoulder implant with excision of sinus tract antibiotic spacer placement and wound VAC placement by Dr. Marlou Sa on 01/18/2022.  On 01/19/2022 infectious disease was consulted who recommended prolonged IV antibiotic course. Patient has homelessness issues which has been a barrier for his disposition as well.  On 02/05/2022, patient was noted to be anemic and will be transfused 1 unit of PRBC.     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 CT of the left shoulder with fluid collection and underwent CT-guided biopsy on 01/17/2022 with removal of 30 mill of fluid status post removal of hardware, irrigation/debridement and placement of antibiotic spacer with 6-8 weeks of IV antibiotic.  Orthopedics and infectious disease on board.  Further management as per orthopedics/ID.  Continue Lidoderm patch for the lower back.  Acute on chronic combined systolic and diastolic CHF (congestive heart failure) (HCC) Acute on chronic systolic CHF with EF 35 to 40% Moderate to  severe aortic regurgitation on prior echo now mild Aortic root 4.27 cm Medical nonadherence Lower leg edema: Patient was supposed  to be on Bumex which he ran out for several months.  Continue CHF protocol.  Seen by cardiology during this hospitalization.  Net IO Since Admission: -17,063.89 mL [01/27/22 0848]  On oral lasix daily low dose.  Alcohol abuse No active signs of withdrawal at this time.  Patient is homeless.  Normocytic anemia Latest hemoglobin was 7.4 after 1 unit of packed RBC transfusion.  Will monitor.  Weekly.  No evidence of external bleeding.  CAD (coronary artery disease) CAD s/p cabg x4 1998 Chronic trifascicular block Hyperlipidemia Continue aspirin and statins.  Hold AV nodal blockers.   Hypokalemia Latest potassium of 4.1.  Improved after replacement.  Back pain Chronic back pain, spinal stenosis.   On pain medication regimen.  Added Lidoderm.  Protein-calorie malnutrition, severe Severe protein calorie malnutrition.   Present on admission.  Nutrition on board.  Continue supplements.  Encourage oral nutrition.  Thoracic aortic aneurysm (TAA) CTA incidentally showed a 4.7 cm thoracic aortic aneurysm, will need semiannual CT or MRI imaging/cardiothoracic surgery follow-up as outpatient  Hypotension Improved at this time.  Not on nodal blockers.   B12 deficiency Continue vitamin B12 replacement.  History of pulmonary embolism Completed treatment with Eliquis.  Not currently on anticoagulation due to risk of falls homelessness, history of hematoma, ongoing alcohol abuse.  Depression Continue hydroxyzine, Wellbutrin, buspirone.  Atrial flutter ([place]) New onset a flutter in the ED Asymptomatic bradycardia during the night 2/26: TSH was within normal limit.  Patient did have pause for 2.90 seconds.  Cardiology was consulted.  Avoid nodal blockers.  Continue anticoagulation.  Rate controlled at this time.    DVT prophylaxis: enoxaparin (LOVENOX) injection 40 mg Start: 01/19/22 1400 SCD's Start: 01/18/22 2238 Place TED hose Start: 01/18/22 2238 SCDs Start: 01/15/22 2347   Code  Status:     Code Status: Full Code  Disposition: Patient is currently homeless, pending arrangement for antibiotic and housing  Status is: Inpatient  Remains inpatient appropriate because:  need for IV antibiotics, pending disposition plan   Family Communication:  Spoke with the patient at bedside.  Consultants:  Orthopedics Cardiology  Procedures:  CT-guided aspiration on 01/17/2022 Total shoulder arthroplasty with antibiotic spacer placement on 01/18/2022 by Dr. Marlou Sa orthopedics   Antimicrobials:  Vancomycin IV  Anti-infectives (From admission, onward)    Start     Dose/Rate Route Frequency Ordered Stop   01/19/22 1030  vancomycin (VANCOREADY) IVPB 750 mg/150 mL        750 mg 150 mL/hr over 60 Minutes Intravenous Every 12 hours 01/19/22 1027     01/18/22 1925  vancomycin (VANCOCIN) powder  Status:  Discontinued          As needed 01/18/22 1925 01/18/22 2103   01/16/22 2200  vancomycin (VANCOREADY) IVPB 1250 mg/250 mL  Status:  Discontinued        1,250 mg 166.7 mL/hr over 90 Minutes Intravenous Every 24 hours 01/16/22 0107 01/19/22 1027   01/16/22 0030  vancomycin (VANCOREADY) IVPB 1500 mg/300 mL        1,500 mg 150 mL/hr over 120 Minutes Intravenous  Once 01/16/22 0018 01/16/22 0240   01/16/22 0015  vancomycin (VANCOREADY) IVPB 2000 mg/400 mL  Status:  Discontinued        2,000 mg 200 mL/hr over 120 Minutes Intravenous  Once 01/16/22 0001 01/16/22 0018       Subjective: Today, patient  was seen and examined at bedside.  Complains of mild shoulder pain.  Denies any fevers chills nausea vomiting  Objective: Vitals:   01/26/22 2126 01/27/22 0500 01/27/22 0640 01/27/22 0740  BP: 104/61  106/70 140/60  Pulse: 98  71 76  Resp: '17  17 18  '$ Temp: 98 F (36.7 C)  98.1 F (36.7 C) 98.9 F (37.2 C)  TempSrc: Oral  Oral Oral  SpO2: 100%  100% 100%  Weight:  88 kg    Height:        Intake/Output Summary (Last 24 hours) at 01/27/2022 1336 Last data filed at 01/27/2022  1218 Gross per 24 hour  Intake 2113.17 ml  Output 1852 ml  Net 261.17 ml    Filed Weights   01/23/22 0500 01/24/22 0453 01/27/22 0500  Weight: 86.4 kg 85.8 kg 88 kg   Body mass index is 30.39 kg/m.   Physical Examination:  General: Mildly obese built, not in obvious distress HENT:   No scleral pallor or icterus noted. Oral mucosa is moist.  Chest:  Clear breath sounds.  Diminished breath sounds bilaterally. No crackles or wheezes.  CVS: S1 &S2 heard. No murmur.  Regular rate and rhythm. Abdomen: Soft, nontender, nondistended.  Bowel sounds are heard.   Extremities: No cyanosis, clubbing or edema.  Peripheral pulses are palpable.  Left shoulder in a sling Psych: Alert, awake and oriented, normal mood CNS:  No cranial nerve deficits.  Moves all extremities. Skin: Warm and dry.  Left shoulder in a sling.   Data Reviewed:   CBC: Recent Labs  Lab 01/21/22 0105 01/22/22 0047 01/25/22 0546 01/26/22 0607 01/26/22 2259 01/27/22 0216  WBC 8.4  --  5.3 5.3  --  7.7  HGB 8.3* 7.9* 7.8* 6.8* 7.9* 7.4*  HCT 26.9* 26.6* 25.4* 22.5* 25.1* 24.3*  MCV 86.5  --  85.5 85.9  --  83.2  PLT 301  --  350 341  --  330     Basic Metabolic Panel: Recent Labs  Lab 01/22/22 0047 01/23/22 0032 01/24/22 0117 01/25/22 0546 01/26/22 0607  NA 136 134* 138 138 133*  K 3.7 4.3 3.5 3.4* 4.1  CL 102 103 106 105 103  CO2 '23 23 25 25 23  '$ GLUCOSE 133* 145* 99 126* 109*  BUN 46* 42* 40* 39* 46*  CREATININE 1.01 0.99 1.05 0.84 0.88  CALCIUM 8.2* 8.4* 8.3* 8.5* 8.0*  MG 2.0 1.9 1.9 1.8 1.9     Liver Function Tests: No results for input(s): AST, ALT, ALKPHOS, BILITOT, PROT, ALBUMIN in the last 168 hours.   Radiology Studies: DG Chest Port 1 View  Result Date: 01/26/2022 CLINICAL DATA:  PICC line placement EXAM: PORTABLE CHEST 1 VIEW COMPARISON:  Portable exam 1606 hours compared to 01/15/2022 FINDINGS: RIGHT arm PICC line tip projects over SVC. Normal heart size post CABG. Mediastinal  contours and pulmonary vascularity normal. Lungs clear. No pulmonary infiltrate, pleural effusion, or pneumothorax. Bones demineralized with cement prosthesis LEFT humeral head and chronic RIGHT rotator cuff tear. IMPRESSION: Tip of RIGHT arm PICC line projects over SVC. Post CABG without acute pulmonary abnormality. Electronically Signed   By: Lavonia Dana M.D.   On: 01/26/2022 16:18   [place] EKG SITE RITE  Result Date: 01/26/2022 If Site Rite image not attached, placement could not be confirmed due to current cardiac rhythm.     LOS: 12 days    Flora Lipps, MD [facility] 01/27/2022, 1:36 PM

## 2022-01-27 NOTE — Progress Notes (Signed)
Patient evaluated. Pain is moderate but controlled. No fevers or chills.  Does complain of increased left wrist pain with swelling and warmth.  He had acute onset 1-2 days ago.  Not severe, only moderate pain. Has history of gout and states it feels similar to previous gout attacks. Plan to order Left wrist radiographs and uric acid level. Ordered colchicine which has provided good relief for him in the past with gout attacks. Wrist does appear swollen with mildly increased warmth compared with contralateral wrist. No significant erythema. Pain with passive motion and palpation diffusely thoughout the wrist. If no improvement with colchicine overnight, consider aspiration of wrist tomorrow with synovial fluid analysis, culture, gram stain, and crystal analysis ?

## 2022-01-27 NOTE — Progress Notes (Signed)
Physical Therapy Treatment ?Patient Details ?Name: Jeremiah [name] ?MRN: [id number] ?DOB: Aug 11, 1951 ?Today's Date: 01/27/2022 ? ? ?History of Present Illness Pt is a 71 y/o M presenting to ED on 2/25 wtih L shoulder wound with pain radiating to chest. Found to have L shoulder anteromedial dislocation after fall 3 months ago. S/p removal of reverse shoulder implant with antibiotic spacer on 2/28. PMH includes CAD s/p bypass, hx of PE and DVT on eliquis, HTN and alcohol abuse. ? ?  ?PT Comments  ? ? Pt with continued progress towards goals this session, requiring up to min guard for gait for safety. Pt with continued c/o L wrist pain, with fair tolerance A/AAROM for increased ROM. Cues for can proximity with pt able to correct and maintain intermittently. Pt continues to perseverate throughout session on all problems (no phone, no address, MD/RN complaints), max attempts to divert conversation but pt returns. Pt continues to benefit from skilled PT services to progress toward functional mobility goals.  ?  ?Recommendations for follow up therapy are one component of a multi-disciplinary discharge planning process, led by the attending physician.  Recommendations may be updated based on patient status, additional functional criteria and insurance authorization. ? ?Follow Up Recommendations ? Home health PT ?  ?  ?Assistance Recommended at Discharge Intermittent Supervision/Assistance  ?Patient can return home with the following A little help with walking and/or transfers;A little help with bathing/dressing/bathroom;Assist for transportation;Help with stairs or ramp for entrance ?  ?Equipment Recommendations ? Cane  ?  ?Recommendations for Other Services   ? ? ?  ?Precautions / Restrictions Precautions ?Precautions: Fall ?Required Braces or Orthoses: Sling ?Restrictions ?Weight Bearing Restrictions: Yes ?LUE Weight Bearing: Non weight bearing ?Other Position/Activity Restrictions: sling on at all times except for  dressing/exercise, AROM of hand/wrist/elbow ok, no shoulder AROM/PROM per MD  ?  ? ?Mobility ? Bed Mobility ?Overal bed mobility: Modified Independent ?  ?  ?  ?  ?  ?  ?  ?  ? ?Transfers ?Overall transfer level: Needs assistance ?Equipment used: Straight cane ?Transfers: Sit to/from Stand ?Sit to Stand: Supervision ?  ?  ?  ?  ?  ?General transfer comment: good technique, min gaurd to steady on rise ?  ? ?Ambulation/Gait ?Ambulation/Gait assistance: Min guard, Supervision ?Gait Distance (Feet): 300 Feet ?Assistive device: Straight cane ?Gait Pattern/deviations: Step-through pattern, Decreased step length - right, Decreased step length - left, Decreased stride length, Wide base of support ?Gait velocity: decreased ?  ?  ?General Gait Details: generalized unsteadiness upon standing, specifially when turning, wide base of support, can correct but states it feels unsteady ? ? ?Stairs ?  ?  ?  ?  ?  ? ? ?Wheelchair Mobility ?  ? ?Modified Rankin (Stroke Patients Only) ?  ? ? ?  ?Balance Overall balance assessment: Needs assistance ?  ?Sitting balance-Leahy Scale: Good ?  ?  ?Standing balance support: Single extremity supported ?Standing balance-Leahy Scale: Fair ?  ?  ?  ?  ?  ?  ?  ?  ?  ?  ?  ?  ?  ? ?  ?Cognition Arousal/Alertness: Awake/alert ?Behavior During Therapy: Flat affect ?Overall Cognitive Status: Within Functional Limits for tasks assessed ?  ?  ?  ?  ?  ?  ?  ?  ?  ?  ?  ?  ?  ?  ?  ?  ?General Comments: pt with concerns about where he is discharging, not having cell phone service, not  having permanent address, perseverating on this throughout session ?  ?  ? ?  ?Exercises General Exercises - Upper Extremity ?Elbow Flexion: Left, 5 reps, AROM, Standing ?Elbow Extension: AROM, Left, 5 reps, Standing ?Wrist Flexion: AROM, Both, 5 reps, Seated ?Wrist Extension: AROM, Left, 5 reps, Seated ?Digit Composite Flexion: AROM, 5 reps, Left, Seated ?Composite Extension: AROM, Left, 5 reps, Seated ?General Exercises -  Lower Extremity ?Toe Raises: AROM, Both, 10 reps, Limitations ?Mini-Sqauts: AROM, Both, 10 reps, Standing ?Other Exercises ?Other Exercises: supination/pronation AROM L x10 ? ?  ?General Comments   ?  ?  ? ?Pertinent Vitals/Pain Pain Assessment ?Pain Assessment: Faces ?Faces Pain Scale: Hurts little more ?Pain Location: L shoulder ?Pain Descriptors / Indicators: Aching ?Pain Intervention(s): Limited activity within patient's tolerance, Monitored during session, Repositioned  ? ? ?Home Living   ?  ?  ?  ?  ?  ?  ?  ?  ?  ?   ?  ?Prior Function    ?  ?  ?   ? ?PT Goals (current goals can now be found in the care plan section) Acute Rehab PT Goals ?PT Goal Formulation: With patient ?Time For Goal Achievement: 02/02/22 ? ?  ?Frequency ? ? ? Min 5X/week ? ? ? ?  ?PT Plan    ? ? ?Co-evaluation   ?  ?  ?  ?  ? ?  ?AM-PAC PT "6 Clicks" Mobility   ?Outcome Measure ? Help needed turning from your back to your side while in a flat bed without using bedrails?: None ?Help needed moving from lying on your back to sitting on the side of a flat bed without using bedrails?: A Little ?Help needed moving to and from a bed to a chair (including a wheelchair)?: A Little ?Help needed standing up from a chair using your arms (e.g., wheelchair or bedside chair)?: A Little ?Help needed to walk in hospital room?: A Little ?Help needed climbing 3-5 steps with a railing? : A Little ?6 Click Score: 19 ? ?  ?End of Session Equipment Utilized During Treatment: Gait belt ?Activity Tolerance: Patient tolerated treatment well ?Patient left: in chair;with call bell/phone within reach;with chair alarm set ?Nurse Communication: Mobility status ?PT Visit Diagnosis: Unsteadiness on feet (R26.81);Muscle weakness (generalized) (M62.81);Pain ?Pain - Right/Left: Left ?Pain - part of body: Arm;Shoulder ?  ? ? ?Time: 0623-7628 ?PT Time Calculation (min) (ACUTE ONLY): 26 min ? ?Charges:  $Gait Training: 8-22 mins ?$Therapeutic Exercise: 8-22 mins           ?          ? ?[name]. PTA ?Acute Rehabilitation Services ?Office: [phone] ? ? ? ?Betsey Holiday Mahati Vajda ?01/27/2022, 3:51 PM ? ?

## 2022-01-27 NOTE — Assessment & Plan Note (Signed)
Severe protein calorie malnutrition.   ?Present on admission.  Nutrition on board.  Continue supplements.  Encourage oral nutrition. ?

## 2022-01-27 NOTE — Progress Notes (Signed)
Mobility Specialist Progress Note  ? ? 01/27/22 1503  ?Mobility  ?Activity Ambulated with assistance in hallway  ?Level of Assistance Contact guard assist, steadying assist  ?Assistive Device Cane  ?LUE Weight Bearing NWB  ?Distance Ambulated (ft) 550 ft  ?Activity Response Tolerated fair  ?$Mobility charge 1 Mobility  ? ?Pt received standing in doorway with gown falling off. Upon entry, saw drop of blood on floor. Pt stated "I pulled it [picc line] out" while pointing to his arm. RN notified. Redressed pt and agreeable to walk. No complaints during walk. Pt said he forgot it was the picc line that just got put in. Took x1 seated rest break. Returned to sitting in chair with call bell in reach.  ? ?Hildred Alamin ?Mobility Specialist  ?M.S. 5N: [id number]  ?

## 2022-01-27 NOTE — Progress Notes (Signed)
Occupational Therapy Treatment ?Patient Details ?Name: Jeremiah [name] ?MRN: [id number] ?DOB: 11-11-1951 ?Today's Date: 01/27/2022 ? ? ?History of present illness Pt is a 71 y/o M presenting to ED on 2/25 wtih L shoulder wound with pain radiating to chest. Found to have L shoulder anteromedial dislocation after fall 3 months ago. S/p removal of reverse shoulder implant with antibiotic spacer on 2/28. PMH includes CAD s/p bypass, hx of PE and DVT on eliquis, HTN and alcohol abuse. ?  ?OT comments ? Focus of session on ambulation to bathroom for toileting and standing grooming. Pt using SPC with min guard assist. 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 Pt overwhelmed and tearful stating he needs help to sort through all of his concerns, unable to identify any support person.   ? ?Recommendations for follow up therapy are one component of a multi-disciplinary discharge planning process, led by the attending physician.  Recommendations may be updated based on patient status, additional functional criteria and insurance authorization. ?   ?Follow Up Recommendations ? Follow physician's recommendations for discharge plan and follow up therapies  ?  ?Assistance Recommended at Discharge Intermittent Supervision/Assistance  ?Patient can return home with the following ? A little help with walking and/or transfers;Assistance with cooking/housework;Direct supervision/assist for financial management;Assist for transportation;Help with stairs or ramp for entrance;A lot of help with bathing/dressing/bathroom ?  ?Equipment Recommendations ? None recommended by OT  ?  ?Recommendations for Other Services   ? ?  ?Precautions / Restrictions Precautions ?Precautions: Fall ?Required Braces or Orthoses: Sling ?Restrictions ?Weight Bearing Restrictions: Yes ?LUE Weight Bearing: Non weight bearing ?Other  Position/Activity Restrictions: sling on at all times except for dressing/exercise, AROM of hand/wrist/elbow ok, no shoulder AROM/PROM per MD  ? ? ?  ? ?Mobility Bed Mobility ?Overal bed mobility: Modified Independent ?  ?  ?  ?  ?  ?  ?  ?  ? ?Transfers ?Overall transfer level: Needs assistance ?Equipment used: Straight cane ?Transfers: Sit to/from Stand ?Sit to Stand: Supervision ?  ?  ?  ?  ?  ?General transfer comment: good technique ?  ?  ?Balance Overall balance assessment: Needs assistance ?  ?Sitting balance-Leahy Scale: Good ?  ?  ?Standing balance support: Single extremity supported ?Standing balance-Leahy Scale: Fair ?  ?  ?  ?  ?  ?  ?  ?  ?  ?  ?  ?  ?   ? ?ADL either performed or assessed with clinical judgement  ? ?ADL Overall ADL's : Needs assistance/impaired ?  ?  ?Grooming: Wash/dry hands;Standing;Min guard ?  ?  ?  ?  ?  ?  ?  ?  ?  ?Toilet Transfer: Min guard;Ambulation;Comfort height toilet;Grab bars ?Toilet Transfer Details (indicate cue type and reason): uses grab bars ?Toileting- Clothing Manipulation and Hygiene: Set up;Sitting/lateral lean ?  ?  ?  ?Functional mobility during ADLs: Min guard;Cane ?  ?  ? ?Extremity/Trunk Assessment   ?  ?  ?  ?  ?  ? ?Vision   ?  ?  ?Perception   ?  ?Praxis   ?  ? ?Cognition Arousal/Alertness: Awake/alert ?Behavior During Therapy: Flat affect ?Overall Cognitive Status: Within Functional Limits for tasks assessed ?  ?  ?  ?  ?  ?  ?  ?  ?  ?  ?  ?  ?  ?  ?  ?  ?  General Comments: pt with concerns about where he is discharging, not having cell phone service, having lost his wallet ?  ?  ?   ?Exercises General Exercises - Upper Extremity ?Elbow Flexion: Left, 5 reps, AROM, Standing ?Elbow Extension: AROM, Left, 5 reps, Standing ?Wrist Flexion: AROM, Both, 5 reps, Seated ?Wrist Extension: AROM, Left, 5 reps, Seated ?Digit Composite Flexion: AROM, 5 reps, Left, Seated ?Composite Extension: AROM, Left, 5 reps, Seated ? ?  ?Shoulder Instructions   ? ? ?  ?General  Comments    ? ? ?Pertinent Vitals/ Pain       Pain Assessment ?Pain Assessment: Faces ?Faces Pain Scale: Hurts even more ?Pain Location: L shoulder ?Pain Descriptors / Indicators: Aching ?Pain Intervention(s): Monitored during session, Premedicated before session ? ?Home Living   ?  ?  ?  ?  ?  ?  ?  ?  ?  ?  ?  ?  ?  ?  ?  ?  ?  ?  ? ?  ?Prior Functioning/Environment    ?  ?  ?  ?   ? ?Frequency ? Min 2X/week  ? ? ? ? ?  ?Progress Toward Goals ? ?OT Goals(current goals can now be found in the care plan section) ? Progress towards OT goals: Progressing toward goals ? ?Acute Rehab OT Goals ?OT Goal Formulation: With patient ?Time For Goal Achievement: 02/02/22 ?Potential to Achieve Goals: Good  ?Plan Discharge plan remains appropriate;Frequency needs to be updated   ? ?Co-evaluation ? ? ?   ?  ?  ?  ?  ? ?  ?AM-PAC OT "6 Clicks" Daily Activity     ?Outcome Measure ? ? Help from another person eating meals?: None ?Help from another person taking care of personal grooming?: A Little ?Help from another person toileting, which includes using toliet, bedpan, or urinal?: A Little ?Help from another person bathing (including washing, rinsing, drying)?: A Lot ?Help from another person to put on and taking off regular upper body clothing?: A Little ?Help from another person to put on and taking off regular lower body clothing?: A Lot ?6 Click Score: 17 ? ?  ?End of Session Equipment Utilized During Treatment: Other (comment) (cane) ? ?OT Visit Diagnosis: Unsteadiness on feet (R26.81);Other abnormalities of gait and mobility (R26.89);Muscle weakness (generalized) (M62.81);Pain ?  ?Activity Tolerance Patient tolerated treatment well ?  ?Patient Left in bed;with call bell/phone within reach;with bed alarm set ?  ?Nurse Communication   ?  ? ?   ? ?Time: 0086-7619 ?OT Time Calculation (min): 26 min ? ?Charges: OT General Charges ?$OT Visit: 1 Visit ?OT Treatments ?$Self Care/Home Management : 23-37 mins ? ?Nestor Lewandowsky,  OTR/L ?Acute Rehabilitation Services ?Pager: [phone] ?Office: [phone]  ? ?Malka So ?01/27/2022, 10:29 AM ?

## 2022-01-27 NOTE — Progress Notes (Signed)
Peripherally Inserted Central Catheter Placement ? ?The IV Nurse has discussed with the patient and/or persons authorized to consent for the patient, the purpose of this procedure and the potential benefits and risks involved with this procedure.  The benefits include less needle sticks, lab draws from the catheter, and the patient may be discharged home with the catheter. Risks include, but not limited to, infection, bleeding, blood clot (thrombus formation), and puncture of an artery; nerve damage and irregular heartbeat and possibility to perform a PICC exchange if needed/ordered by physician.  Alternatives to this procedure were also discussed.  Bard Power PICC patient education guide, fact sheet on infection prevention and patient information card has been provided to patient /or left at bedside.   ? ?PICC Placement Documentation  ?PICC Single Lumen 67/54/49 Right Cephalic 35 cm 0 cm (Active)  ?Indication for Insertion or Continuance of Line Prolonged intravenous therapies 01/27/22 2137  ?Exposed Catheter (cm) 0 cm 01/27/22 2137  ?Site Assessment Clean, Dry, Intact 01/27/22 2137  ?Line Status Saline locked;Blood return noted 01/27/22 2137  ?Dressing Type Securing device;Transparent 01/27/22 2137  ?Dressing Status Antimicrobial disc in place;Clean, Dry, Intact 01/27/22 2137  ?Safety Lock Not Applicable 20/10/07 1219  ?Line Care Connections checked and tightened 01/27/22 2137  ?Line Adjustment (NICU/IV Team Only) No 01/27/22 2137  ?Dressing Intervention New dressing 01/27/22 2137  ?Dressing Change Due 02/03/22 01/27/22 2137  ? ? ? ? ? ?Jeremiah [name] ?01/27/2022, 9:38 PM ? ?

## 2022-01-27 NOTE — Assessment & Plan Note (Addendum)
Chronic back pain, spinal stenosis.   Patient does have high tolerance to pain.  On pain medication regimen oxycodone every 4 hourly.  Continue Lidoderm patch, gabapentin 3 times daily.  resumed tizanidine.

## 2022-01-27 NOTE — Progress Notes (Signed)
Had a talk with pt and told him he needed to have a PICC line for antibiotics but that he could not pull it out. He said he would promise not to pull one out if another one was placed. Dr. Louanne Belton informed. ?

## 2022-01-28 DIAGNOSIS — M25539 Pain in unspecified wrist: Secondary | ICD-10-CM

## 2022-01-28 LAB — BASIC METABOLIC PANEL
Anion gap: 7 (ref 5–15)
BUN: 40 mg/dL — ABNORMAL HIGH (ref 8–23)
CO2: 24 mmol/L (ref 22–32)
Calcium: 8.3 mg/dL — ABNORMAL LOW (ref 8.9–10.3)
Chloride: 108 mmol/L (ref 98–111)
Creatinine, Ser: 0.88 mg/dL (ref 0.61–1.24)
GFR, Estimated: >60 mL/min (ref >60)
Glucose, Bld: 94 mg/dL (ref 70–99)
Potassium: 3.4 mmol/L — ABNORMAL LOW (ref 3.5–5.1)
Sodium: 139 mmol/L (ref 135–145)

## 2022-01-28 LAB — CBC
HCT: 24.1 % — ABNORMAL LOW (ref 39.0–52.0)
Hemoglobin: 7.4 g/dL — ABNORMAL LOW (ref 13.0–17.0)
MCH: 26.1 pg (ref 26.0–34.0)
MCHC: 30.7 g/dL (ref 30.0–36.0)
MCV: 85.2 fL (ref 80.0–100.0)
Platelets: 348 10*3/uL (ref 150–400)
RBC: 2.83 MIL/uL — ABNORMAL LOW (ref 4.22–5.81)
RDW: 17.6 % — ABNORMAL HIGH (ref 11.5–15.5)
WBC: 5.5 10*3/uL (ref 4.0–10.5)
nRBC: 0 % (ref 0.0–0.2)

## 2022-01-28 LAB — VITAMIN A: Vitamin A (Retinoic Acid): 25.7 ug/dL (ref 22.0–69.5)

## 2022-01-28 LAB — VITAMIN K1, SERUM: VITAMIN K1: 0.46 ng/mL (ref 0.10–2.20)

## 2022-01-28 LAB — VITAMIN E
Vitamin E (Alpha Tocopherol): 9 mg/L (ref 9.0–29.0)
Vitamin E(Gamma Tocopherol): 1.1 mg/L (ref 0.5–4.9)

## 2022-01-28 MED ORDER — POTASSIUM CHLORIDE CRYS ER 20 MEQ PO TBCR
40.0000 meq | EXTENDED_RELEASE_TABLET | Freq: Every day | ORAL | Status: DC
  Administered 2022-01-29 – 2022-02-21 (×24): 40 meq via ORAL
  Filled 2022-01-28 (×24): qty 2

## 2022-01-28 MED ORDER — PANTOPRAZOLE SODIUM 40 MG PO TBEC
40.0000 mg | DELAYED_RELEASE_TABLET | Freq: Two times a day (BID) | ORAL | Status: DC
Start: 2022-01-28 — End: 2022-03-08
  Administered 2022-01-28 – 2022-03-08 (×79): 40 mg via ORAL
  Filled 2022-01-28 (×84): qty 1

## 2022-01-28 MED ORDER — NAPROXEN 250 MG PO TABS
500.0000 mg | ORAL_TABLET | Freq: Two times a day (BID) | ORAL | Status: AC
  Administered 2022-01-28 – 2022-01-29 (×3): 500 mg via ORAL
  Filled 2022-01-28 (×3): qty 2

## 2022-01-28 MED ORDER — VANCOMYCIN HCL 1250 MG/250ML IV SOLN
1250.0000 mg | INTRAVENOUS | Status: DC
  Administered 2022-01-28: 1250 mg via INTRAVENOUS
  Filled 2022-01-28 (×2): qty 250

## 2022-01-28 MED ORDER — POTASSIUM CHLORIDE CRYS ER 20 MEQ PO TBCR
40.0000 meq | EXTENDED_RELEASE_TABLET | Freq: Once | ORAL | Status: AC
  Administered 2022-01-28: 40 meq via ORAL
  Filled 2022-01-28: qty 2

## 2022-01-28 MED ORDER — GABAPENTIN 300 MG PO CAPS
300.0000 mg | ORAL_CAPSULE | Freq: Three times a day (TID) | ORAL | Status: DC
  Administered 2022-01-28 – 2022-03-08 (×118): 300 mg via ORAL
  Filled 2022-01-28 (×118): qty 1

## 2022-01-28 NOTE — Assessment & Plan Note (Addendum)
Left wrist pain.  Seen by orthopedics.   Uric acid of 4.4.  X-ray of the left wrist shows severe osteoarthritis.  Orthopedic recommended  3-day course of colchicine.  Has improved at this time.

## 2022-01-28 NOTE — Progress Notes (Signed)
PROGRESS NOTE    Jeremiah [name]  FOY:[id number] DOB: 1951-03-30 DOA: 01/15/2022 PCP: Carol Ada, MD    Brief Narrative:  Patient is a 71 y.o. male with medical history significant of CAD s/p bypass in 1998, Hx of PE and DVT on Eliquis, HTN, alcohol abuse with hx of withdrawal, opioid dependency previous on Suboxone presented to the hospital with left shoulder and neck pain.  Reportedly patient lost his wife last year and moved out to [place] to live with his brother. However did not get along with his sister-in-law and has recently moved back here to [place] l a week prior to presentation and noted to be homeless and living in [place]. Noted to have multiple admissions recently in [place] for alcohol withdrawal.  On 10/2021 he was admitted following a fall and was noted to have a large left-sided chest wall hematoma  had multiple I&D and wound VAC in January due to persistent refractory hematoma, found to have chronically dislocated left shoulder arthroplasty in the setting of likely infection.  On 02/06/2021-CTA showed large burden of thromboembolic disease with central pulmonary artery distribution.  Saw Pulm in office with Dr Janett Billow at [facility]- who noted 'it sounds like his pulmonary embolism was associated with some lower extremity cellulitis which he was hospitalized and minimally mobile. He was treated with Eliquis, now off.  In the ED on this admission patient was hemodynamically stable.  Labs showed some mild anemia of hemoglobin 9.1.  EKG showed atrial flutter.  Troponin was borderline.  Left shoulder x-ray showed anterior dislocation of left reverse total shoulder arthroplasty.  Patient had CTA chest on 01/16/2022 which showed no evidence of pulmonary embolism but pulmonary hypertension and cardiomegaly.  There was note of 4.7 cm thoracic aortic aneurysm.  Radiology recommended dysemia [place] CT or MRI follow-up and referral to cardiothoracic surgery as outpatient.   Patient was also seen by orthopedics during hospitalization  and the  CT of left shoulder-showed postsurgical changes and dislocation, large periprosthetic fluid collection surrounding the proximal humerus and extending throughout the left axillary region up to 7.8 X4.4X 8.8-sequelae of postoperative seroma, hematoma versus particle disease.  Patient had 2D echocardiogram on 01/16/2022 which showed reduced LV function and cardiology was consulted.  On 01/17/2022 CT-guided biopsy of the left anterior axillary fluid was done with removal of 30 mils of cloudy fluid with.  Patient underwent removal of shoulder implant with excision of sinus tract antibiotic spacer placement and wound VAC placement by Dr. Marlou Sa on 01/18/2022.  On 01/19/2022 infectious disease was consulted who recommended prolonged IV antibiotic course. Patient has homelessness issues which has been a barrier for his disposition as well.  On 01/26/2022, patient was noted to be anemic and received 1 unit of packed RBC.      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 CT of the left shoulder with fluid collection and underwent CT-guided biopsy on 01/17/2022 with removal of 30 mill of fluid status post removal of hardware, irrigation/debridement and placement of antibiotic spacer with 6-8 weeks of IV antibiotic.  Orthopedics and infectious disease on board.  Further management as per orthopedics/ID.  Continue Lidoderm patch for the lower back.  Acute on chronic combined systolic and diastolic CHF (congestive heart failure) (HCC) Acute on chronic systolic CHF with EF 35 to 40% Moderate to  severe aortic regurgitation on prior echo now mild Aortic root 4.27 cm Medical nonadherence Lower leg edema: Patient was supposed to  be on Bumex which he ran out for several months.  Continue CHF protocol.  Seen by cardiology during this hospitalization.  Net IO Since Admission: -18,971.89 mL [01/28/22 1126]  On oral lasix daily low dose.  Alcohol abuse No active signs of withdrawal at this time.  Continue supportive care with thiamine folic acid.  Normocytic anemia Latest hemoglobin was 7.4 after 1 unit of packed RBC transfusion.  Will monitor. No evidence of external bleeding.  CAD (coronary artery disease) CAD s/p cabg x4 1998 Chronic trifascicular block Hyperlipidemia Continue aspirin and statins.  Hold AV nodal blockers.   Wrist pain Left wrist pain.  Seen by orthopedics.  Has been started on colchicine for possible gout.  Uric acid of 4.4.  X-ray of the left wrist shows severe osteoarthritis.  Hypokalemia We will give 1 dose of 40 mEq of potassium again today.  Check levels in a.m.  Back pain Chronic back pain, spinal stenosis.   Patient does have high tolerance to pain.  On pain medication regimen oxycodone every 4 hourly..  Added Lidoderm patch for the back.  Takes gabapentin 9 3 times daily.  Continue Tylenol.  Protein-calorie malnutrition, severe Severe protein calorie malnutrition.   Present on admission.  Nutrition on board.  Continue supplements.  Encourage oral nutrition.  Thoracic aortic aneurysm (TAA) CTA incidentally showed a 4.7 cm thoracic aortic aneurysm, will need semiannual CT or MRI imaging/cardiothoracic surgery follow-up as outpatient  Hypotension Improved at this time.  Mildly elevated today.  We will continue to monitor.  Not on nodal blockers.  Might need antihypertensive if continues to remain high.   B12 deficiency Continue vitamin B12 replacement.  History of pulmonary embolism Completed treatment with Eliquis.  Not currently on anticoagulation due to risk of falls homelessness, history of hematoma, ongoing alcohol abuse.  Depression Continue hydroxyzine, Wellbutrin,  buspirone.  Atrial flutter ([place]) New onset a flutter in the ED Asymptomatic bradycardia during the night 2/26: TSH was within normal limit.  Patient did have pause for 2.90 seconds.  Cardiology was consulted.  Avoid nodal blockers.  Continue anticoagulation.  Rate controlled at this time.    DVT prophylaxis: enoxaparin (LOVENOX) injection 40 mg Start: 01/19/22 1400 SCD's Start: 01/18/22 2238 Place TED hose Start: 01/18/22 2238 SCDs Start: 01/15/22 2347   Code Status:     Code Status: Full Code  Disposition: Patient is currently homeless, pending arrangement for antibiotic and housing  Status is: Inpatient  Remains inpatient appropriate because:  need for IV antibiotics, pending disposition plan   Family Communication:  Spoke with the patient at bedside.  Consultants:  Orthopedics Cardiology  Procedures:  CT-guided aspiration on 01/17/2022 Total shoulder arthroplasty with antibiotic spacer placement on 01/18/2022 by Dr. Marlou Sa orthopedics   Antimicrobials:  Vancomycin IV  Anti-infectives (From admission, onward)    Start     Dose/Rate Route Frequency Ordered Stop   01/28/22 1500  vancomycin (VANCOREADY) IVPB 1250 mg/250 mL        1,250 mg 166.7 mL/hr over 90 Minutes Intravenous Every 24 hours 01/28/22 1010     01/19/22 1030  vancomycin (VANCOREADY) IVPB 750 mg/150 mL  Status:  Discontinued        750 mg 150 mL/hr over 60 Minutes Intravenous Every 12 hours 01/19/22 1027 01/28/22 1004   01/18/22 1925  vancomycin (VANCOCIN) powder  Status:  Discontinued  As needed 01/18/22 1925 01/18/22 2103   01/16/22 2200  vancomycin (VANCOREADY) IVPB 1250 mg/250 mL  Status:  Discontinued        1,250 mg 166.7 mL/hr over 90 Minutes Intravenous Every 24 hours 01/16/22 0107 01/19/22 1027   01/16/22 0030  vancomycin (VANCOREADY) IVPB 1500 mg/300 mL        1,500 mg 150 mL/hr over 120 Minutes Intravenous  Once 01/16/22 0018 01/16/22 0240   01/16/22 0015  vancomycin (VANCOREADY) IVPB  2000 mg/400 mL  Status:  Discontinued        2,000 mg 200 mL/hr over 120 Minutes Intravenous  Once 01/16/22 0001 01/16/22 0018       Subjective: Today, patient was seen and examined at bedside.  Patient complains of mild shoulder pain and wrist pain.  States that that he needs more pain medication.  Denies any fever, nausea, vomiting, chills or rigor.    Objective: Vitals:   01/27/22 0740 01/27/22 2041 01/28/22 0514 01/28/22 0834  BP: 140/60 (!) 127/57 (!) 128/48 (!) 161/71  Pulse: 76 79 (!) 58 81  Resp: '18 18 18 17  '$ Temp: 98.9 F (37.2 C) 98.2 F (36.8 C) 97.6 F (36.4 C) 97.6 F (36.4 C)  TempSrc: Oral Oral Oral Oral  SpO2: 100% 100% 98% 100%  Weight:      Height:        Intake/Output Summary (Last 24 hours) at 01/28/2022 1132 Last data filed at 01/28/2022 0900 Gross per 24 hour  Intake 719 ml  Output 2626 ml  Net -1907 ml   Filed Weights   01/23/22 0500 01/24/22 0453 01/27/22 0500  Weight: 86.4 kg 85.8 kg 88 kg   Body mass index is 30.39 kg/m.   Physical Examination:  General: Mildly obese built, not in obvious distress HENT:   No scleral pallor or icterus noted. Oral mucosa is moist.  Chest:  Clear breath sounds.  Diminished breath sounds bilaterally. No crackles or wheezes.  CVS: S1 &S2 heard. No murmur.  Regular rate and rhythm. Abdomen: Soft, nontender, nondistended.  Bowel sounds are heard.   Extremities: No cyanosis, clubbing or edema.  Peripheral pulses are palpable.  Left shoulder in a sling.  Left wrist with mild swelling and pain. Psych: Alert, awake and oriented, normal mood CNS:  No cranial nerve deficits.  Moves all extremities. Skin: Warm and dry.  Left shoulder in a sling.   Data Reviewed:   CBC: Recent Labs  Lab 01/25/22 0546 01/26/22 0607 01/26/22 2259 01/27/22 0216 01/28/22 0320  WBC 5.3 5.3  --  7.7 5.5  HGB 7.8* 6.8* 7.9* 7.4* 7.4*  HCT 25.4* 22.5* 25.1* 24.3* 24.1*  MCV 85.5 85.9  --  83.2 85.2  PLT 350 341  --  330 348     Basic Metabolic Panel: Recent Labs  Lab 01/22/22 0047 01/23/22 0032 01/24/22 0117 01/25/22 0546 01/26/22 0607 01/28/22 0320  NA 136 134* 138 138 133* 139  K 3.7 4.3 3.5 3.4* 4.1 3.4*  CL 102 103 106 105 103 108  CO2 '23 23 25 25 23 24  '$ GLUCOSE 133* 145* 99 126* 109* 94  BUN 46* 42* 40* 39* 46* 40*  CREATININE 1.01 0.99 1.05 0.84 0.88 0.88  CALCIUM 8.2* 8.4* 8.3* 8.5* 8.0* 8.3*  MG 2.0 1.9 1.9 1.8 1.9  --     Liver Function Tests: No results for input(s): AST, ALT, ALKPHOS, BILITOT, PROT, ALBUMIN in the last 168 hours.   Radiology Studies: DG Wrist Complete Left  Result Date: 01/27/2022 CLINICAL DATA:  Acute left wrist pain. EXAM: LEFT WRIST - COMPLETE 3+ VIEW COMPARISON:  None. FINDINGS: There is no evidence of fracture or dislocation. Severe degenerative changes seen involving the first carpometacarpal joint. Mild narrowing of radiocarpal joint is noted. Calcification of triangular fibrocartilage is noted. Soft tissues are unremarkable. 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 No acute abnormality is noted. Electronically Signed   By: Marijo Conception M.D.   On: 01/27/2022 18:26   DG CHEST PORT 1 VIEW  Result Date: 01/27/2022 CLINICAL DATA:  New PICC placed today. EXAM: PORTABLE CHEST 1 VIEW COMPARISON:  Radiograph yesterday. FINDINGS: Right upper extremity PICC tip overlies the mid SVC. No pneumothorax. Post median sternotomy and CABG. Mild cardiomegaly. No focal airspace disease or pleural effusion. No pulmonary edema. Postsurgical change of the left proximal humerus. Chronic right shoulder arthropathy IMPRESSION: 1. Right upper extremity PICC tip overlies the mid SVC. 2. Mild cardiomegaly post CABG. No acute pulmonary process. Electronically Signed   By: Keith Rake M.D.   On: 01/27/2022 22:01   DG Chest Port 1 View  Result Date: 01/26/2022 CLINICAL DATA:  PICC line placement EXAM: PORTABLE CHEST 1 VIEW COMPARISON:  Portable exam 1606  hours compared to 01/15/2022 FINDINGS: RIGHT arm PICC line tip projects over SVC. Normal heart size post CABG. Mediastinal contours and pulmonary vascularity normal. Lungs clear. No pulmonary infiltrate, pleural effusion, or pneumothorax. Bones demineralized with cement prosthesis LEFT humeral head and chronic RIGHT rotator cuff tear. IMPRESSION: Tip of RIGHT arm PICC line projects over SVC. Post CABG without acute pulmonary abnormality. Electronically Signed   By: Lavonia Dana M.D.   On: 01/26/2022 16:18   [place] EKG SITE RITE  Result Date: 01/27/2022 If Site Rite image not attached, placement could not be confirmed due to current cardiac rhythm.  [place] EKG SITE RITE  Result Date: 01/26/2022 If Site Rite image not attached, placement could not be confirmed due to current cardiac rhythm.     LOS: 13 days    Flora Lipps, MD [facility] 01/28/2022, 11:32 AM

## 2022-01-28 NOTE — Progress Notes (Signed)
Patient seen earlier today.  Wrist pain is definitely improved compared with yesterday though not resolved.  He has continued swelling diffusely throughout the wrist.  Less tender throughout the wrist and not as much pain with passive motion.  There is no significant erythema overlying the wrist.  Feels the colchicine is helping.  Radiographs of the left wrist taken yesterday demonstrate severe degenerative changes of the radiocarpal, STT, first CMC joints.  No intervention needed regarding the wrist at this time.  Plan to continue with colchicine for 3 days total and then discontinue.  No aspiration needed at this time.   ? ?Left shoulder is doing well overall.  Axillary nerve is still intact with deltoid firing today.  2+ radial pulse of the operative extremity.  Dressing is dry and intact.  Shoulder is anteriorly subluxed.  Did discuss with Archibald that increasing his pain medicine dosage is not recommended at this time; he is receiving oxycodone 10 mg every 4 hours.  Continues to deny any fevers or chills. ?

## 2022-01-28 NOTE — Progress Notes (Signed)
Mobility Specialist Progress Note  ? ? 01/28/22 1135  ?Mobility  ?Activity Ambulated with assistance in hallway  ?Level of Assistance Contact guard assist, steadying assist  ?Assistive Device Cane  ?Distance Ambulated (ft) 550 ft  ?Activity Response Tolerated fair  ?$Mobility charge 1 Mobility  ? ?Pt received in bed and agreeable. C/o shoulder and back pain but no rating given. Took x1 seated rest break. Returned to chair with call bell in reach, alarm on and PA present.  ? ?Jeremiah [name] ?Mobility Specialist  ?M.S. 5N: [phone]  ?

## 2022-01-28 NOTE — Progress Notes (Signed)
Physical Therapy Treatment ?Patient Details ?Name: Jeremiah [name] ?MRN: [id number] ?DOB: [birth year] ?Today's Date: 01/28/2022 ? ? ?History of Present Illness Pt is a 71 y/o M presenting to ED on 2/25 wtih L shoulder wound with pain radiating to chest. Found to have L shoulder anteromedial dislocation after fall 3 months ago. S/p removal of reverse shoulder implant with antibiotic spacer on 2/28. PMH includes CAD s/p bypass, hx of PE and DVT on eliquis, HTN and alcohol abuse. ? ?  ?PT Comments  ? ? Pt with good progress towards goals this session requiring up to min guard for ambulation for safety and management of IV pole. Pt with continued general unsteadiness and poor insight into deficits and safety. Pt with increased tolerance for ambulation this session with no c/o DOE or pain, with increase gait speed. Pt continues to benefit from skilled PT services to progress toward functional mobility goals.  ?  ?Recommendations for follow up therapy are one component of a multi-disciplinary discharge planning process, led by the attending physician.  Recommendations may be updated based on patient status, additional functional criteria and insurance authorization. ? ?Follow Up Recommendations ? Home health PT ?  ?  ?Assistance Recommended at Discharge Intermittent Supervision/Assistance  ?Patient can return home with the following A little help with walking and/or transfers;A little help with bathing/dressing/bathroom;Assist for transportation;Help with stairs or ramp for entrance ?  ?Equipment Recommendations ? Cane  ?  ?Recommendations for Other Services   ? ? ?  ?Precautions / Restrictions Precautions ?Precautions: Fall ?Required Braces or Orthoses: Sling ?Restrictions ?Weight Bearing Restrictions: Yes ?LUE Weight Bearing: Non weight bearing ?Other Position/Activity Restrictions: sling on at all times except for dressing/exercise, AROM of hand/wrist/elbow ok, no shoulder AROM/PROM per MD  ?  ? ?Mobility ? Bed Mobility ?   ?  ?  ?  ?  ?  ?  ?General bed mobility comments: pt in chair on arrival ?  ? ?Transfers ?Overall transfer level: Needs assistance ?Equipment used: Straight cane ?Transfers: Sit to/from Stand ?Sit to Stand: Supervision ?  ?  ?  ?  ?  ?General transfer comment: good technique, min gaurd to steady on rise ?  ? ?Ambulation/Gait ?Ambulation/Gait assistance: Min guard, Supervision ?Gait Distance (Feet): 510 Feet ?Assistive device: Straight cane ?Gait Pattern/deviations: Step-through pattern, Decreased step length - right, Decreased step length - left, Decreased stride length, Wide base of support ?Gait velocity: decreased ?  ?  ?General Gait Details: generalized unsteadiness upon standing, specifially when turning, wide base of support, can correct but states it feels unsteady ? ? ?Stairs ?  ?  ?  ?  ?  ? ? ?Wheelchair Mobility ?  ? ?Modified Rankin (Stroke Patients Only) ?  ? ? ?  ?Balance Overall balance assessment: Needs assistance ?  ?Sitting balance-Leahy Scale: Good ?  ?  ?Standing balance support: Single extremity supported ?Standing balance-Leahy Scale: Fair ?  ?  ?  ?  ?  ?  ?  ?  ?  ?  ?  ?  ?  ? ?  ?Cognition Arousal/Alertness: Awake/alert ?Behavior During Therapy: Flat affect ?Overall Cognitive Status: Within Functional Limits for tasks assessed ?  ?  ?  ?  ?  ?  ?  ?  ?  ?  ?  ?  ?  ?  ?  ?  ?  ?  ?  ? ?  ?Exercises   ? ?  ?General Comments   ?  ?  ? ?Pertinent Vitals/Pain  Pain Assessment ?Pain Assessment: Faces ?Faces Pain Scale: Hurts a little bit ?Pain Location: L shoulder ?Pain Descriptors / Indicators: Aching  ? ? ?Home Living   ?  ?  ?  ?  ?  ?  ?  ?  ?  ?   ?  ?Prior Function    ?  ?  ?   ? ?PT Goals (current goals can now be found in the care plan section) Acute Rehab PT Goals ?PT Goal Formulation: With patient ?Time For Goal Achievement: 02/02/22 ? ?  ?Frequency ? ? ? Min 5X/week ? ? ? ?  ?PT Plan    ? ? ?Co-evaluation   ?  ?  ?  ?  ? ?  ?AM-PAC PT "6 Clicks" Mobility   ?Outcome Measure ? Help  needed turning from your back to your side while in a flat bed without using bedrails?: None ?Help needed moving from lying on your back to sitting on the side of a flat bed without using bedrails?: A Little ?Help needed moving to and from a bed to a chair (including a wheelchair)?: A Little ?Help needed standing up from a chair using your arms (e.g., wheelchair or bedside chair)?: A Little ?Help needed to walk in hospital room?: A Little ?Help needed climbing 3-5 steps with a railing? : A Little ?6 Click Score: 19 ? ?  ?End of Session Equipment Utilized During Treatment: Gait belt ?Activity Tolerance: Patient tolerated treatment well ?Patient left: in chair;with call bell/phone within reach;with chair alarm set ?Nurse Communication: Mobility status ?PT Visit Diagnosis: Unsteadiness on feet (R26.81);Muscle weakness (generalized) (M62.81);Pain ?Pain - Right/Left: Left ?Pain - part of body: Arm;Shoulder ?  ? ? ?Time: 7564-3329 ?PT Time Calculation (min) (ACUTE ONLY): 21 min ? ?Charges:  $Gait Training: 8-22 mins          ?          ? ?[name]. PTA ?Acute Rehabilitation Services ?Office: [phone] ? ? ? ?Betsey Holiday Abelardo Seidner ?01/28/2022, 2:33 PM ? ?

## 2022-01-28 NOTE — TOC Progression Note (Addendum)
Transition of Care (TOC) - Progression Note  ? ? ?Patient Details  ?Name: Jeremiah [name] ?MRN: [id number] ?Date of Birth: February 28, 1951 ? ?Transition of Care (TOC) CM/SW Contact  ?Marilu Favre, RN ?Phone Number: ?01/28/2022, 9:57 AM ? ?Clinical Narrative:    ? ?Discussed in [place] meeting 01/27/22 with [facility] supervisor.  ? ?Patient homeless needing 6 weeks of IV ABX start date 01/18/22.  No housing found, SNF declined. ? ?1230 Discussed with patient. Again patient has no where to stay. NCM left message for supervisor. Unsure of a plan other than patient staying in hospital for IV ABX . Patient aware  ? ?Patient will need to make arrangements for after PICC is removed and IV ABX are completed. Patient aware  ? ? ?TOC Team notes patient is homeless and unfortunately at this time there is significant barriers to securing housing in the medical setting. TOC  has provided patient with information on resources to follow-up with in the community once he  discharges. NCM encouraged patient to looking at resources.  ? ?Expected Discharge Plan: [place] ?Barriers to Discharge: Continued Medical Work up ? ?Expected Discharge Plan and Services ?Expected Discharge Plan: [place] ?  ?Discharge Planning Services: CM Consult ?  ?Living arrangements for the past 2 months: [place] ?                ?  ?  ?  ?  ?  ?  ?  ?  ?  ?  ? ? ?Social Determinants of Health (SDOH) Interventions ?  ? ?Readmission Risk Interventions ?No flowsheet data found. ? ?

## 2022-01-29 DIAGNOSIS — F1010 Alcohol abuse, uncomplicated: Secondary | ICD-10-CM | POA: Diagnosis not present

## 2022-01-29 DIAGNOSIS — I5043 Acute on chronic combined systolic (congestive) and diastolic (congestive) heart failure: Secondary | ICD-10-CM | POA: Diagnosis not present

## 2022-01-29 DIAGNOSIS — I483 Typical atrial flutter: Secondary | ICD-10-CM | POA: Diagnosis not present

## 2022-01-29 DIAGNOSIS — S41002A Unspecified open wound of left shoulder, initial encounter: Secondary | ICD-10-CM | POA: Diagnosis not present

## 2022-01-29 LAB — CBC
HCT: 25.6 % — ABNORMAL LOW (ref 39.0–52.0)
Hemoglobin: 7.7 g/dL — ABNORMAL LOW (ref 13.0–17.0)
MCH: 25.8 pg — ABNORMAL LOW (ref 26.0–34.0)
MCHC: 30.1 g/dL (ref 30.0–36.0)
MCV: 85.9 fL (ref 80.0–100.0)
Platelets: 360 10*3/uL (ref 150–400)
RBC: 2.98 MIL/uL — ABNORMAL LOW (ref 4.22–5.81)
RDW: 17.6 % — ABNORMAL HIGH (ref 11.5–15.5)
WBC: 5.6 10*3/uL (ref 4.0–10.5)
nRBC: 0 % (ref 0.0–0.2)

## 2022-01-29 LAB — BASIC METABOLIC PANEL
Anion gap: 10 (ref 5–15)
BUN: 50 mg/dL — ABNORMAL HIGH (ref 8–23)
CO2: 19 mmol/L — ABNORMAL LOW (ref 22–32)
Calcium: 8.4 mg/dL — ABNORMAL LOW (ref 8.9–10.3)
Chloride: 107 mmol/L (ref 98–111)
Creatinine, Ser: 1.24 mg/dL (ref 0.61–1.24)
GFR, Estimated: >60 mL/min (ref >60)
Glucose, Bld: 120 mg/dL — ABNORMAL HIGH (ref 70–99)
Potassium: 4 mmol/L (ref 3.5–5.1)
Sodium: 136 mmol/L (ref 135–145)

## 2022-01-29 MED ORDER — VANCOMYCIN HCL IN DEXTROSE 1-5 GM/200ML-% IV SOLN
1000.0000 mg | INTRAVENOUS | Status: DC
  Administered 2022-01-29 – 2022-01-31 (×3): 1000 mg via INTRAVENOUS
  Filled 2022-01-29 (×4): qty 200
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
## 2022-01-29 NOTE — Progress Notes (Signed)
PHARMACY NOTE:  ANTIMICROBIAL RENAL DOSAGE ADJUSTMENT ? ?Current antimicrobial regimen includes a mismatch between antimicrobial dosage and estimated renal function.  As per policy approved by the Pharmacy & Therapeutics and Medical Executive Committees, the antimicrobial dosage will be adjusted accordingly. ? ?Current antimicrobial dosage:  Vancomycin 1250 MG IV Q24H (eAUC 515, based on Scr 0.88 and levels on 3/10) ? 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 ?Renal Function: ? ?Estimated Creatinine Clearance: 59.6 mL/min (by C-G formula based on SCr of 1.24 mg/dL). ?'[]'$      On intermittent HD, scheduled: ?'[]'$      On CRRT ?   ?Antimicrobial dosage has been changed to:  1000 MG IV Q24H (eAUC 473, Scr 1.24) ? ?Additional comments: ?Levels as indicated.  ? ?Thank you for allowing pharmacy to be a part of this patient's care. ? ?Cephus Slater, PharmD, MBA ?Pharmacy Resident ?([phone] ?01/29/2022 9:03 AM ? ?

## 2022-01-29 NOTE — Progress Notes (Signed)
PROGRESS NOTE    Jeremiah [name]  TIW:[id number] DOB: [birth year] DOA: 01/15/2022 PCP: Carol Ada, MD    Brief Narrative:  Patient is a 71 y.o. male with medical history significant of CAD s/p bypass in 1998, Hx of PE and DVT on Eliquis, HTN, alcohol abuse with hx of withdrawal, opioid dependency previous on Suboxone presented to the hospital with left shoulder and neck pain.  Reportedly patient lost his wife last year and moved out to [place] to live with his brother. However did not get along with his sister-in-law and has recently moved back here to [place] l a week prior to presentation and noted to be homeless and living in [place]. Noted to have multiple admissions recently in [place] for alcohol withdrawal.  On 10/2021 he was admitted following a fall and was noted to have a large left-sided chest wall hematoma  had multiple I&D and wound VAC in January due to persistent refractory hematoma, found to have chronically dislocated left shoulder arthroplasty in the setting of likely infection.  On 02/06/2021-CTA showed large burden of thromboembolic disease with central pulmonary artery distribution.  Saw Pulm in office with Dr Janett Billow at [facility]- who noted 'it sounds like his pulmonary embolism was associated with some lower extremity cellulitis which he was hospitalized and minimally mobile. He was treated with Eliquis, now off.  In the ED on this admission patient was hemodynamically stable.  Labs showed some mild anemia of hemoglobin 9.1.  EKG showed atrial flutter.  Troponin was borderline.  Left shoulder x-ray showed anterior dislocation of left reverse total shoulder arthroplasty.  Patient had CTA chest on 01/16/2022 which showed no evidence of pulmonary embolism but pulmonary hypertension and cardiomegaly.  There was note of 4.7 cm thoracic aortic aneurysm.  Radiology recommended dysemia [place] CT or MRI follow-up and referral to cardiothoracic surgery as outpatient.   Patient was also seen by orthopedics during hospitalization  and the  CT of left shoulder-showed postsurgical changes and dislocation, large periprosthetic fluid collection surrounding the proximal humerus and extending throughout the left axillary region up to 7.8 X4.4X 8.8-sequelae of postoperative seroma, hematoma versus particle disease.  Patient had 2D echocardiogram on 01/16/2022 which showed reduced LV function and cardiology was consulted.  On 01/17/2022 CT-guided biopsy of the left anterior axillary fluid was done with removal of 30 mils of cloudy fluid with.  Patient underwent removal of shoulder implant with excision of sinus tract antibiotic spacer placement and wound VAC placement by Dr. Marlou Sa on 01/18/2022.  On 01/19/2022 infectious disease was consulted who recommended prolonged IV antibiotic course. Patient has homelessness issues which has been a barrier for his disposition as well.  On 01/26/2022, patient was noted to be anemic and received 1 unit of packed RBC.      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 CT of the left shoulder with fluid collection and underwent CT-guided biopsy on 01/17/2022 with removal of 30 mill of fluid status post removal of hardware, irrigation/debridement and placement of antibiotic spacer with 6-8 weeks of IV antibiotic.  Orthopedics and infectious disease on board.  Further management as per orthopedics/ID.  Continue Lidoderm patch for the lower back.  Acute on chronic combined systolic and diastolic CHF (congestive heart failure) (HCC) Acute on chronic systolic CHF with EF 35 to 40% Moderate to  severe aortic regurgitation on prior echo now mild Aortic root 4.27 cm Medical nonadherence Lower leg edema: Patient was supposed to  be on Bumex which he ran out for several months.  Continue CHF protocol.  Seen by cardiology during this hospitalization.  Net IO Since Admission: -19,051.89 mL [01/29/22 0909]  On oral lasix daily low dose.  Alcohol abuse No active signs of withdrawal at this time.  Continue supportive care with thiamine folic acid.  Normocytic anemia Latest hemoglobin was 7.4 after 1 unit of packed RBC transfusion.  Will monitor. No evidence of external bleeding.  CAD (coronary artery disease) CAD s/p cabg x4 1998 Chronic trifascicular block Hyperlipidemia Continue aspirin and statins.  Hold AV nodal blockers.   Wrist pain Left wrist pain.  Seen by orthopedics.  Has been started on colchicine for possible gout.  Uric acid of 4.4.  X-ray of the left wrist shows severe osteoarthritis.  Recommend start 3-day course of colchicine.  Hypokalemia We will give 1 dose of 40 mEq of potassium again today.  Check levels in a.m.  Back pain Chronic back pain, spinal stenosis.   Patient does have high tolerance to pain.  On pain medication regimen oxycodone every 4 hourly..  Added Lidoderm patch for the back.  Takes gabapentin 9 3 times daily.  Continue Tylenol.  Protein-calorie malnutrition, severe Severe protein calorie malnutrition.   Present on admission.  Nutrition on board.  Continue supplements.  Encourage oral nutrition.  Thoracic aortic aneurysm (TAA) CTA incidentally showed a 4.7 cm thoracic aortic aneurysm, will need semiannual CT or MRI imaging/cardiothoracic surgery follow-up as outpatient  Hypotension Improved at this time.  Mildly elevated today.  We will continue to monitor.  Not on nodal blockers.  Might need antihypertensive if continues to remain high.   B12 deficiency Continue vitamin B12 replacement.  History of pulmonary embolism Completed treatment with Eliquis.  Not currently on anticoagulation due to risk of falls homelessness, history of hematoma, ongoing alcohol  abuse.  Depression Continue hydroxyzine, Wellbutrin, buspirone.  Atrial flutter ([place]) New onset a flutter in the ED Asymptomatic bradycardia during the night 2/26: TSH was within normal limit.  Patient did have pause for 2.90 seconds.  Cardiology was consulted.  Avoid nodal blockers.  Continue anticoagulation.  Rate controlled at this time.    DVT prophylaxis: enoxaparin (LOVENOX) injection 40 mg Start: 01/19/22 1400 SCD's Start: 01/18/22 2238 Place TED hose Start: 01/18/22 2238 SCDs Start: 01/15/22 2347   Code Status:     Code Status: Full Code  Disposition: Patient is currently homeless, pending arrangement for antibiotic and housing  Status is: Inpatient  Remains inpatient appropriate because:  need for IV antibiotics, pending disposition plan   Family Communication:  Spoke with the patient at bedside.  Consultants:  Orthopedics Cardiology  Procedures:  CT-guided aspiration on 01/17/2022 Total shoulder arthroplasty with antibiotic spacer placement on 01/18/2022 by Dr. Marlou Sa orthopedics   Antimicrobials:  Vancomycin IV  Anti-infectives (From admission, onward)    Start     Dose/Rate Route Frequency Ordered Stop   01/29/22 1000  vancomycin (VANCOCIN) IVPB 1000 mg/200 mL premix        1,000 mg 200 mL/hr over 60 Minutes Intravenous Every 24 hours 01/29/22 0904     01/28/22 1500  vancomycin (VANCOREADY) IVPB 1250 mg/250 mL  Status:  Discontinued        1,250 mg 166.7 mL/hr over 90 Minutes Intravenous Every 24 hours 01/28/22 1010 01/29/22 0904   01/19/22 1030  vancomycin (VANCOREADY) IVPB  750 mg/150 mL  Status:  Discontinued        750 mg 150 mL/hr over 60 Minutes Intravenous Every 12 hours 01/19/22 1027 01/28/22 1004   01/18/22 1925  vancomycin (VANCOCIN) powder  Status:  Discontinued          As needed 01/18/22 1925 01/18/22 2103   01/16/22 2200  vancomycin (VANCOREADY) IVPB 1250 mg/250 mL  Status:  Discontinued        1,250 mg 166.7 mL/hr over 90 Minutes  Intravenous Every 24 hours 01/16/22 0107 01/19/22 1027   01/16/22 0030  vancomycin (VANCOREADY) IVPB 1500 mg/300 mL        1,500 mg 150 mL/hr over 120 Minutes Intravenous  Once 01/16/22 0018 01/16/22 0240   01/16/22 0015  vancomycin (VANCOREADY) IVPB 2000 mg/400 mL  Status:  Discontinued        2,000 mg 200 mL/hr over 120 Minutes Intravenous  Once 01/16/22 0001 01/16/22 0018       Subjective:  Today, patient was seen and examined at bedside.  Complains of mild wrist and shoulder pain.  Wrist pain has slightly improved.  Denies any nausea vomiting fever chills or rigor.    Objective: Vitals:   01/28/22 0834 01/28/22 1519 01/28/22 2158 01/29/22 0605  BP: (!) 161/71 102/60 (!) 97/47 (!) 125/50  Pulse: 81 63 75 74  Resp: '17 17 19 19  '$ Temp: 97.6 F (36.4 C) 97.9 F (36.6 C) 98 F (36.7 C) 98.7 F (37.1 C)  TempSrc: Oral Oral Oral   SpO2: 100% 100% 100% 100%  Weight:      Height:        Intake/Output Summary (Last 24 hours) at 01/29/2022 0909 Last data filed at 01/28/2022 1522 Gross per 24 hour  Intake 220 ml  Output 300 ml  Net -80 ml   Filed Weights   01/23/22 0500 01/24/22 0453 01/27/22 0500  Weight: 86.4 kg 85.8 kg 88 kg   Body mass index is 30.39 kg/m.   Physical Examination:  General: Obese, not in obvious distress, looks older than stated age HENT:   No scleral pallor or icterus noted. Oral mucosa is moist.  Chest:  Clear breath sounds.  Diminished breath sounds bilaterally. No crackles or wheezes.  CVS: S1 &S2 heard. No murmur.  Regular rate and rhythm. Abdomen: Soft, nontender, nondistended.  Bowel sounds are heard.   Extremities: No cyanosis, clubbing or edema.  Peripheral pulses are palpable.  Left shoulder in a sling.  Left wrist with mild pain and swelling. Psych: Alert, awake and oriented, normal mood CNS:  No cranial nerve deficits.  Moves all extremities. Skin: Warm and dry.  No rashes noted.  Left shoulder in sling.   Data Reviewed:   CBC: Recent  Labs  Lab 01/25/22 0546 01/26/22 0607 01/26/22 2259 01/27/22 0216 01/28/22 0320 01/29/22 0111  WBC 5.3 5.3  --  7.7 5.5 5.6  HGB 7.8* 6.8* 7.9* 7.4* 7.4* 7.7*  HCT 25.4* 22.5* 25.1* 24.3* 24.1* 25.6*  MCV 85.5 85.9  --  83.2 85.2 85.9  PLT 350 341  --  330 348 016    Basic Metabolic Panel: Recent Labs  Lab 01/23/22 0032 01/24/22 0117 01/25/22 0546 01/26/22 0607 01/28/22 0320 01/29/22 0111  NA 134* 138 138 133* 139 136  K 4.3 3.5 3.4* 4.1 3.4* 4.0  CL 103 106 105 103 108 107  CO2 '23 25 25 23 24 '$ 19*  GLUCOSE 145* 99 126* 109* 94 120*  BUN 42* 40* 39* 46*  40* 50*  CREATININE 0.99 1.05 0.84 0.88 0.88 1.24  CALCIUM 8.4* 8.3* 8.5* 8.0* 8.3* 8.4*  MG 1.9 1.9 1.8 1.9  --   --     Liver Function Tests: No results for input(s): AST, ALT, ALKPHOS, BILITOT, PROT, ALBUMIN in the last 168 hours.   Radiology Studies: DG Wrist Complete Left  Result Date: 01/27/2022 CLINICAL DATA:  Acute left wrist pain. EXAM: LEFT WRIST - COMPLETE 3+ VIEW COMPARISON:  None. FINDINGS: There is no evidence of fracture or dislocation. Severe degenerative changes seen involving the first carpometacarpal joint. Mild narrowing of radiocarpal joint is noted. Calcification of triangular fibrocartilage is noted. Soft tissues are unremarkable. 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 No acute abnormality is noted. Electronically Signed   By: Marijo Conception M.D.   On: 01/27/2022 18:26   DG CHEST PORT 1 VIEW  Result Date: 01/27/2022 CLINICAL DATA:  New PICC placed today. EXAM: PORTABLE CHEST 1 VIEW COMPARISON:  Radiograph yesterday. FINDINGS: Right upper extremity PICC tip overlies the mid SVC. No pneumothorax. Post median sternotomy and CABG. Mild cardiomegaly. No focal airspace disease or pleural effusion. No pulmonary edema. Postsurgical change of the left proximal humerus. Chronic right shoulder arthropathy IMPRESSION: 1. Right upper extremity PICC tip overlies the mid SVC. 2.  Mild cardiomegaly post CABG. No acute pulmonary process. Electronically Signed   By: Keith Rake M.D.   On: 01/27/2022 22:01   [place] EKG SITE RITE  Result Date: 01/27/2022 If Site Rite image not attached, placement could not be confirmed due to current cardiac rhythm.     LOS: 14 days    Flora Lipps, MD [facility] 01/29/2022, 9:09 AM

## 2022-01-30 DIAGNOSIS — J301 Allergic rhinitis due to pollen: Secondary | ICD-10-CM | POA: Insufficient documentation

## 2022-01-30 DIAGNOSIS — D509 Iron deficiency anemia, unspecified: Secondary | ICD-10-CM | POA: Insufficient documentation

## 2022-01-30 DIAGNOSIS — R413 Other amnesia: Secondary | ICD-10-CM | POA: Insufficient documentation

## 2022-01-30 DIAGNOSIS — E559 Vitamin D deficiency, unspecified: Secondary | ICD-10-CM | POA: Insufficient documentation

## 2022-01-30 DIAGNOSIS — R296 Repeated falls: Secondary | ICD-10-CM | POA: Insufficient documentation

## 2022-01-30 DIAGNOSIS — F1010 Alcohol abuse, uncomplicated: Secondary | ICD-10-CM | POA: Diagnosis not present

## 2022-01-30 DIAGNOSIS — I5043 Acute on chronic combined systolic (congestive) and diastolic (congestive) heart failure: Secondary | ICD-10-CM | POA: Diagnosis not present

## 2022-01-30 DIAGNOSIS — I453 Trifascicular block: Secondary | ICD-10-CM | POA: Insufficient documentation

## 2022-01-30 DIAGNOSIS — F325 Major depressive disorder, single episode, in full remission: Secondary | ICD-10-CM | POA: Insufficient documentation

## 2022-01-30 DIAGNOSIS — G4700 Insomnia, unspecified: Secondary | ICD-10-CM | POA: Insufficient documentation

## 2022-01-30 DIAGNOSIS — M109 Gout, unspecified: Secondary | ICD-10-CM | POA: Insufficient documentation

## 2022-01-30 DIAGNOSIS — I483 Typical atrial flutter: Secondary | ICD-10-CM | POA: Diagnosis not present

## 2022-01-30 DIAGNOSIS — S41002A Unspecified open wound of left shoulder, initial encounter: Secondary | ICD-10-CM | POA: Diagnosis not present

## 2022-01-30 DIAGNOSIS — M129 Arthropathy, unspecified: Secondary | ICD-10-CM | POA: Insufficient documentation

## 2022-01-30 DIAGNOSIS — M4807 Spinal stenosis, lumbosacral region: Secondary | ICD-10-CM | POA: Insufficient documentation

## 2022-01-30 DIAGNOSIS — Z6834 Body mass index (BMI) 34.0-34.9, adult: Secondary | ICD-10-CM | POA: Insufficient documentation

## 2022-01-30 DIAGNOSIS — N5201 Erectile dysfunction due to arterial insufficiency: Secondary | ICD-10-CM | POA: Insufficient documentation

## 2022-01-30 DIAGNOSIS — I119 Hypertensive heart disease without heart failure: Secondary | ICD-10-CM | POA: Insufficient documentation

## 2022-01-30 MED ORDER — TIZANIDINE HCL 2 MG PO TABS
2.0000 mg | ORAL_TABLET | Freq: Three times a day (TID) | ORAL | Status: DC | PRN
  Administered 2022-01-30 – 2022-02-28 (×32): 2 mg via ORAL
  Filled 2022-01-30 (×32): qty 1

## 2022-01-30 NOTE — Progress Notes (Signed)
PROGRESS NOTE    ARLANDO LEISINGER  TIR:[id number] DOB: Sep 22, 1951 DOA: 01/15/2022 PCP: Carol Ada, MD    Brief Narrative:  Patient is a 71 y.o. male with medical history significant of CAD s/p bypass in 1998, Hx of PE and DVT on Eliquis, HTN, alcohol abuse with hx of withdrawal, opioid dependency previous on Suboxone presented to the hospital with left shoulder and neck pain.  Reportedly patient lost his wife last year and moved out to [place] to live with his brother. However did not get along with his sister-in-law and has recently moved back here to [place] l a week prior to presentation and noted to be homeless and living in [place]. Noted to have multiple admissions recently in [place] for alcohol withdrawal.  On 10/2021 he was admitted following a fall and was noted to have a large left-sided chest wall hematoma  had multiple I&D and wound VAC in January due to persistent refractory hematoma, found to have chronically dislocated left shoulder arthroplasty in the setting of likely infection.  On 02/06/2021-CTA showed large burden of thromboembolic disease with central pulmonary artery distribution.  Saw Pulm in office with Dr Janett Billow at [facility]- who noted 'it sounds like his pulmonary embolism was associated with some lower extremity cellulitis which he was hospitalized and minimally mobile. He was treated with Eliquis, now off.  In the ED on this admission patient was hemodynamically stable.  Labs showed some mild anemia of hemoglobin 9.1.  EKG showed atrial flutter.  Troponin was borderline.  Left shoulder x-ray showed anterior dislocation of left reverse total shoulder arthroplasty.  Patient had CTA chest on 01/16/2022 which showed no evidence of pulmonary embolism but pulmonary hypertension and cardiomegaly.  There was note of 4.7 cm thoracic aortic aneurysm.  Radiology recommended dysemia [place] CT or MRI follow-up and referral to cardiothoracic surgery as outpatient.   Patient was also seen by orthopedics during hospitalization  and the  CT of left shoulder-showed postsurgical changes and dislocation, large periprosthetic fluid collection surrounding the proximal humerus and extending throughout the left axillary region up to 7.8 X4.4X 8.8-sequelae of postoperative seroma, hematoma versus particle disease.  Patient had 2D echocardiogram on 01/16/2022 which showed reduced LV function and cardiology was consulted.  On 01/17/2022 CT-guided biopsy of the left anterior axillary fluid was done with removal of 30 mils of cloudy fluid with.  Patient underwent removal of shoulder implant with excision of sinus tract antibiotic spacer placement and wound VAC placement by Dr. Marlou Sa on 01/18/2022.  On 01/19/2022 infectious disease was consulted who recommended prolonged IV antibiotic course. Patient has homelessness issues which has been a barrier for his disposition as well.  On 01/26/2022, patient was noted to be anemic and received 1 unit of packed RBC.  Otherwise he has remained stable except for ongoing pain issues.     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 CT of the left shoulder showed fluid collection and underwent CT-guided biopsy on 01/17/2022 with removal of 30 mil of fluid status post removal of hardware, irrigation/debridement and placement of antibiotic spacer with 6-8 weeks of IV antibiotic.  Orthopedics and infectious disease followed the patient during hospitalization.  Further management as per orthopedics/ID.  Continue Lidoderm patch for the lower back.  Acute on chronic combined systolic and diastolic CHF (congestive heart failure) (  [place]) Acute on chronic systolic CHF with EF 35 to 40% Moderate to severe aortic regurgitation on prior  echo now mild Aortic root 4.27 cm Medical nonadherence Lower leg edema: Patient was supposed to be on Bumex which he ran out for several months.  Continue CHF protocol.  Seen by cardiology during this hospitalization.  Net IO Since Admission: -20,891.89 mL [01/30/22 0858]  On oral lasix daily low dose.  Alcohol abuse No withdrawal symptoms at this time..  Continue thiamine folic acid.  Normocytic anemia Latest hemoglobin was 7.7 after 1 unit of packed RBC transfusion.  Will monitor CBC in AM.  Continue vitamin B12.  CAD (coronary artery disease) CAD s/p cabg x4 1998 Chronic trifascicular block Hyperlipidemia Continue aspirin and statins.  Hold AV nodal blockers.   Wrist pain Left wrist pain.  Seen by orthopedics.  Has been started on colchicine for possible gout.  Uric acid of 4.4.  X-ray of the left wrist shows severe osteoarthritis.  Orthopedic recommended  3-day course of colchicine.  Hypokalemia Potassium at 4.0.  Improved after oral supplement.  Back pain Chronic back pain, spinal stenosis.   Patient does have high tolerance to pain.  On pain medication regimen oxycodone every 4 hourly.  Continue Lidoderm patch, gabapentin 3 times daily.  Add tizanidine.  Protein-calorie malnutrition, severe Severe protein calorie malnutrition.   Present on admission.  Nutrition on board.  Continue supplements.  Encourage oral nutrition.  Thoracic aortic aneurysm (TAA) CTA incidentally showed a 4.7 cm thoracic aortic aneurysm, will need semiannual CT or MRI imaging/cardiothoracic surgery follow-up as outpatient  Hypotension Improved at this time.  Mildly elevated today.  We will continue to monitor.  Not on nodal blockers.  Might need antihypertensive if continues to remain high.   B12 deficiency Continue vitamin B12 replacement.  History of pulmonary embolism Completed treatment with Eliquis.  Not currently on anticoagulation due to risk of falls homelessness, history of hematoma,  ongoing alcohol abuse.  Depression Continue hydroxyzine, Wellbutrin, buspirone.  Atrial flutter ([place]) New onset a flutter in the ED Asymptomatic bradycardia during the night 2/26: TSH was within normal limit.  Patient did have pause for 2.90 seconds.  Cardiology was consulted.  Avoid nodal blockers.  Continue anticoagulation.  Rate controlled at this time.    DVT prophylaxis: enoxaparin (LOVENOX) injection 40 mg Start: 01/19/22 1400 SCD's Start: 01/18/22 2238 Place TED hose Start: 01/18/22 2238 SCDs Start: 01/15/22 2347   Code Status:     Code Status: Full Code  Disposition: Patient is currently homeless, pending arrangement for antibiotic and housing  Status is: Inpatient  Remains inpatient appropriate because:  need for IV antibiotics, pending disposition plan   Family Communication:  Spoke with the patient at bedside.  Consultants:  Orthopedics Cardiology  Procedures:  CT-guided aspiration on 01/17/2022 Total shoulder arthroplasty with antibiotic spacer placement on 01/18/2022 by Dr. Marlou Sa orthopedics   Antimicrobials:  Vancomycin IV  Anti-infectives (From admission, onward)    Start     Dose/Rate Route Frequency Ordered Stop   01/29/22 1000  vancomycin (VANCOCIN) IVPB 1000 mg/200 mL premix        1,000 mg 200 mL/hr over 60 Minutes Intravenous Every 24 hours 01/29/22 0904     01/28/22 1500  vancomycin (VANCOREADY) IVPB 1250 mg/250 mL  Status:  Discontinued        1,250 mg 166.7 mL/hr over 90 Minutes Intravenous Every 24 hours 01/28/22 1010 01/29/22 0904   01/19/22 1030  vancomycin (VANCOREADY) IVPB 750 mg/150 mL  Status:  Discontinued        750 mg 150 mL/hr over 60 Minutes Intravenous Every 12 hours 01/19/22 1027 01/28/22 1004   01/18/22 1925  vancomycin (VANCOCIN) powder  Status:  Discontinued          As needed 01/18/22 1925 01/18/22 2103   01/16/22 2200  vancomycin (VANCOREADY) IVPB 1250 mg/250 mL  Status:  Discontinued        1,250 mg 166.7 mL/hr over 90  Minutes Intravenous Every 24 hours 01/16/22 0107 01/19/22 1027   01/16/22 0030  vancomycin (VANCOREADY) IVPB 1500 mg/300 mL        1,500 mg 150 mL/hr over 120 Minutes Intravenous  Once 01/16/22 0018 01/16/22 0240   01/16/22 0015  vancomycin (VANCOREADY) IVPB 2000 mg/400 mL  Status:  Discontinued        2,000 mg 200 mL/hr over 120 Minutes Intravenous  Once 01/16/22 0001 01/16/22 0018       Subjective:  Today, patient was seen and examined at bedside.  Complains of anxiety.  Complains of mild pain.  Wrist swelling and pain has improved.  Objective: Vitals:   01/29/22 1844 01/29/22 2043 01/30/22 0632 01/30/22 1057  BP: 136/74 128/85 135/78 138/64  Pulse: 60 89 75 61  Resp: '18 19 19 19  '$ Temp: 97.8 F (36.6 C) 98.2 F (36.8 C) 98 F (36.7 C) 98.7 F (37.1 C)  TempSrc: Oral Oral Oral Oral  SpO2: 100% 100% 100% 100%  Weight:      Height:        Intake/Output Summary (Last 24 hours) at 01/30/2022 1202 Last data filed at 01/30/2022 3419 Gross per 24 hour  Intake 240 ml  Output 1700 ml  Net -1460 ml   Filed Weights   01/23/22 0500 01/24/22 0453 01/27/22 0500  Weight: 86.4 kg 85.8 kg 88 kg   Body mass index is 30.39 kg/m.   Physical Examination:  General: Obese built, not in obvious distress, looks older than his stated age. HENT:   No scleral pallor or icterus noted. Oral mucosa is moist.  Chest:  Clear breath sounds.  Diminished breath sounds bilaterally. No crackles or wheezes.  CVS: S1 &S2 heard. No murmur.  Regular rate and rhythm. Abdomen: Soft, nontender, nondistended.  Bowel sounds are heard.   Extremities: No cyanosis, clubbing or edema.  Peripheral pulses are palpable.  Left shoulder in sling, left wrist with mild edema and tenderness. Psych: Alert, awake and oriented, normal mood CNS:  No cranial nerve deficits.  Moving extremities. Skin: Warm and dry.  Left shoulder in a sling.  Data Reviewed:   CBC: Recent Labs  Lab 01/25/22 0546 01/26/22 0607  01/26/22 2259 01/27/22 0216 01/28/22 0320 01/29/22 0111  WBC 5.3 5.3  --  7.7 5.5 5.6  HGB 7.8* 6.8* 7.9* 7.4* 7.4* 7.7*  HCT 25.4* 22.5* 25.1* 24.3* 24.1* 25.6*  MCV 85.5 85.9  --  83.2 85.2 85.9  PLT 350 341  --  330 348 379    Basic Metabolic Panel: Recent Labs  Lab 01/24/22 0117 01/25/22 0546 01/26/22 0607 01/28/22 0320 01/29/22 0111  NA 138 138 133* 139 136  K 3.5 3.4* 4.1 3.4* 4.0  CL 106 105 103 108 107  CO2 '25 25 23 24 '$ 19*  GLUCOSE 99 126* 109* 94 120*  BUN 40* 39* 46* 40* 50*  CREATININE 1.05 0.84 0.88 0.88 1.24  CALCIUM 8.3* 8.5* 8.0* 8.3* 8.4*  MG 1.9 1.8 1.9  --   --  Liver Function Tests: No results for input(s): AST, ALT, ALKPHOS, BILITOT, PROT, ALBUMIN in the last 168 hours.   Radiology Studies: No results found.    LOS: 15 days    Flora Lipps, MD [facility] 01/30/2022, 12:02 PM

## 2022-01-31 ENCOUNTER — Ambulatory Visit: Payer: Medicare Other | Admitting: Critical Care Medicine

## 2022-01-31 DIAGNOSIS — F1010 Alcohol abuse, uncomplicated: Secondary | ICD-10-CM | POA: Diagnosis not present

## 2022-01-31 DIAGNOSIS — I5043 Acute on chronic combined systolic (congestive) and diastolic (congestive) heart failure: Secondary | ICD-10-CM | POA: Diagnosis not present

## 2022-01-31 DIAGNOSIS — S41002A Unspecified open wound of left shoulder, initial encounter: Secondary | ICD-10-CM | POA: Diagnosis not present

## 2022-01-31 DIAGNOSIS — I483 Typical atrial flutter: Secondary | ICD-10-CM | POA: Diagnosis not present

## 2022-01-31 MED ORDER — ENSURE MAX PROTEIN PO LIQD
11.0000 [oz_av] | Freq: Two times a day (BID) | ORAL | Status: DC
  Administered 2022-01-31 – 2022-03-08 (×70): 11 [oz_av] via ORAL
  Filled 2022-01-31 (×77): qty 330

## 2022-01-31 NOTE — Progress Notes (Signed)
PROGRESS NOTE    Jeremiah [name]  EVO:[id number] DOB: 1951/07/10 DOA: 01/15/2022 PCP: Carol Ada, MD    Brief Narrative:  Patient is a 71 y.o. male with medical history significant of CAD s/p bypass in 1998, Hx of PE and DVT on Eliquis, HTN, alcohol abuse with hx of withdrawal, opioid dependency previous on Suboxone presented to the hospital with left shoulder and neck pain.  Reportedly patient lost his wife last year and moved out to [place] to live with his brother. However did not get along with his sister-in-law and has recently moved back here to [place] l a week prior to presentation and noted to be homeless and living in [place]. Noted to have multiple admissions recently in [place] for alcohol withdrawal.  On 10/2021 he was admitted following a fall and was noted to have a large left-sided chest wall hematoma  had multiple I&D and wound VAC in January due to persistent refractory hematoma, found to have chronically dislocated left shoulder arthroplasty in the setting of likely infection.  On 02/06/2021-CTA showed large burden of thromboembolic disease with central pulmonary artery distribution.  Saw Pulm in office with Dr Janett Billow at [facility]- who noted 'it sounds like his pulmonary embolism was associated with some lower extremity cellulitis which he was hospitalized and minimally mobile. He was treated with Eliquis, now off.  In the ED on this admission patient was hemodynamically stable.  Labs showed some mild anemia of hemoglobin 9.1.  EKG showed atrial flutter.  Troponin was borderline.  Left shoulder x-ray showed anterior dislocation of left reverse total shoulder arthroplasty.  Patient had CTA chest on 01/16/2022 which showed no evidence of pulmonary embolism but pulmonary hypertension and cardiomegaly.  There was note of 4.7 cm thoracic aortic aneurysm.  Radiology recommended dysemia [place] CT or MRI follow-up and referral to cardiothoracic surgery as outpatient.   Patient was also seen by orthopedics during hospitalization  and the  CT of left shoulder-showed postsurgical changes and dislocation, large periprosthetic fluid collection surrounding the proximal humerus and extending throughout the left axillary region up to 7.8 X4.4X 8.8-sequelae of postoperative seroma, hematoma versus particle disease.  Patient had 2D echocardiogram on 01/16/2022 which showed reduced LV function and cardiology was consulted.  On 01/17/2022 CT-guided biopsy of the left anterior axillary fluid was done with removal of 30 mils of cloudy fluid with.  Patient underwent removal of shoulder implant with excision of sinus tract antibiotic spacer placement and wound VAC placement by Dr. Marlou Sa on 01/18/2022.  On 01/19/2022 infectious disease was consulted who recommended prolonged IV antibiotic course. Patient has homelessness issues which has been a barrier for his disposition as well.  On 01/26/2022, patient was noted to be anemic and received 1 unit of packed RBC.  Otherwise he has remained stable except for ongoing pain issues.     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 CT of the left shoulder showed fluid collection and underwent CT-guided biopsy on 01/17/2022 with removal of 30 mil of fluid status post removal of hardware, irrigation/debridement and placement of antibiotic spacer with 6-8 weeks of IV antibiotic.  Orthopedics and infectious disease followed the patient during hospitalization.  Acute on chronic combined systolic and diastolic CHF (congestive heart failure) (HCC) Acute on chronic systolic CHF with EF 35 to 40% Moderate to severe  aortic regurgitation on prior echo now mild Aortic root 4.27 cm Medical nonadherence Lower leg edema: Patient was  supposed to be on Bumex which he ran out for several months.  Continue CHF protocol.  Seen by cardiology during this hospitalization.  Net IO Since Admission: -23,121.89 mL [01/31/22 1131]  On oral lasix daily low dose.  Alcohol abuse No withdrawal symptoms at this time..  Continue thiamine, folic acid.  Normocytic anemia Latest hemoglobin was 7.7 after 1 unit of packed RBC transfusion.   Continue vitamin B12.  CAD (coronary artery disease) CAD s/p cabg x4 1998 Chronic trifascicular block Hyperlipidemia Continue aspirin and statins.  Hold AV nodal blockers.   Wrist pain Left wrist pain.  Seen by orthopedics.   Uric acid of 4.4.  X-ray of the left wrist shows severe osteoarthritis.  Orthopedic recommended  3-day course of colchicine.  Has improved at this time.  Hypokalemia Potassium at 4.0.  Improved after oral supplement.  Back pain Chronic back pain, spinal stenosis.   Patient does have high tolerance to pain.  On pain medication regimen oxycodone every 4 hourly.  Continue Lidoderm patch, gabapentin 3 times daily.  Add tizanidine.  Protein-calorie malnutrition, severe Severe protein calorie malnutrition.   Present on admission.  Nutrition on board.  Continue supplements.  Encourage oral nutrition.  Thoracic aortic aneurysm (TAA) CTA incidentally showed a 4.7 cm thoracic aortic aneurysm, will need semiannual CT or MRI imaging/cardiothoracic surgery follow-up as outpatient  Hypotension Improved at this time.  Mildly elevated today.  We will continue to monitor.  Not on nodal blockers.  Might need antihypertensive if continues to remain high.   B12 deficiency Continue vitamin B12 replacement.  History of pulmonary embolism Completed treatment with Eliquis.  Not currently on anticoagulation due to risk of falls homelessness, history of hematoma, ongoing alcohol abuse.  Depression Continue hydroxyzine, Wellbutrin, buspirone.  Atrial flutter ([place]) New onset a flutter in the  ED Asymptomatic bradycardia during the night 2/26: TSH was within normal limit.  Patient did have pause for 2.90 seconds.  Cardiology was consulted.  Avoid nodal blockers.  Continue anticoagulation.  Rate controlled at this time.    DVT prophylaxis: enoxaparin (LOVENOX) injection 40 mg Start: 01/19/22 1400 SCD's Start: 01/18/22 2238 Place TED hose Start: 01/18/22 2238 SCDs Start: 01/15/22 2347   Code Status:     Code Status: Full Code  Disposition: Patient is currently homeless, pending arrangement for antibiotic and housing  Status is: Inpatient  Remains inpatient appropriate because:  need for IV antibiotics, pending disposition plan   Family Communication:  Spoke with the patient at bedside.  Consultants:  Orthopedics Cardiology  Procedures:  CT-guided aspiration on 01/17/2022 Total shoulder arthroplasty with antibiotic spacer placement on 01/18/2022 by Dr. Marlou Sa orthopedics   Antimicrobials:  Vancomycin IV  Anti-infectives (From admission, onward)    Start     Dose/Rate Route Frequency Ordered Stop   01/29/22 1000  vancomycin (VANCOCIN) IVPB 1000 mg/200 mL premix        1,000 mg 200 mL/hr over 60 Minutes Intravenous Every 24 hours 01/29/22 0904     01/28/22 1500  vancomycin (VANCOREADY) IVPB 1250 mg/250 mL  Status:  Discontinued        1,250 mg 166.7 mL/hr over 90 Minutes Intravenous Every 24 hours 01/28/22 1010 01/29/22 0904   01/19/22 1030  vancomycin (VANCOREADY) IVPB 750 mg/150 mL  Status:  Discontinued        750 mg 150 mL/hr over 60 Minutes Intravenous Every 12 hours  01/19/22 1027 01/28/22 1004   01/18/22 1925  vancomycin (VANCOCIN) powder  Status:  Discontinued          As needed 01/18/22 1925 01/18/22 2103   01/16/22 2200  vancomycin (VANCOREADY) IVPB 1250 mg/250 mL  Status:  Discontinued        1,250 mg 166.7 mL/hr over 90 Minutes Intravenous Every 24 hours 01/16/22 0107 01/19/22 1027   01/16/22 0030  vancomycin (VANCOREADY) IVPB 1500 mg/300 mL         1,500 mg 150 mL/hr over 120 Minutes Intravenous  Once 01/16/22 0018 01/16/22 0240   01/16/22 0015  vancomycin (VANCOREADY) IVPB 2000 mg/400 mL  Status:  Discontinued        2,000 mg 200 mL/hr over 120 Minutes Intravenous  Once 01/16/22 0001 01/16/22 0018       Subjective:  Physical patient was seen and examined at bedside.  Complains of mild anxiety. Improved wrist swelling.  Objective: Vitals:   01/30/22 1057 01/30/22 1958 01/31/22 0450 01/31/22 0756  BP: 138/64 (!) 106/58 (!) 116/49 (!) 155/87  Pulse: 61 64 79 65  Resp: '19 18 17 18  '$ Temp: 98.7 F (37.1 C) 98 F (36.7 C) 98.1 F (36.7 C) 98 F (36.7 C)  TempSrc: Oral Oral Oral Oral  SpO2: 100% 100% 99% 99%  Weight:      Height:        Intake/Output Summary (Last 24 hours) at 01/31/2022 1132 Last data filed at 01/31/2022 0813 Gross per 24 hour  Intake 980 ml  Output 2100 ml  Net -1120 ml   Filed Weights   01/23/22 0500 01/24/22 0453 01/27/22 0500  Weight: 86.4 kg 85.8 kg 88 kg   Body mass index is 30.39 kg/m.   Physical Examination:  General: Obese built, not in obvious distress, appears older than stated age HENT:   No scleral pallor or icterus noted. Oral mucosa is moist.  Chest:  Clear breath sounds.  Diminished breath sounds bilaterally. No crackles or wheezes.  CVS: S1 &S2 heard. No murmur.  Regular rate and rhythm. Abdomen: Soft, nontender, nondistended.  Bowel sounds are heard.   Extremities: No cyanosis, clubbing or edema.  Peripheral pulses are palpable.  Left shoulder in a sling.  Left wrist with mild edema. Psych: Alert, awake and oriented, normal mood CNS:  No cranial nerve deficits.  Moves all extremities. Skin: Warm and dry.  Left shoulder in a sling.   Data Reviewed:   CBC: Recent Labs  Lab 01/25/22 0546 01/26/22 0607 01/26/22 2259 01/27/22 0216 01/28/22 0320 01/29/22 0111  WBC 5.3 5.3  --  7.7 5.5 5.6  HGB 7.8* 6.8* 7.9* 7.4* 7.4* 7.7*  HCT 25.4* 22.5* 25.1* 24.3* 24.1* 25.6*  MCV  85.5 85.9  --  83.2 85.2 85.9  PLT 350 341  --  330 348 779    Basic Metabolic Panel: Recent Labs  Lab 01/25/22 0546 01/26/22 0607 01/28/22 0320 01/29/22 0111  NA 138 133* 139 136  K 3.4* 4.1 3.4* 4.0  CL 105 103 108 107  CO2 '25 23 24 '$ 19*  GLUCOSE 126* 109* 94 120*  BUN 39* 46* 40* 50*  CREATININE 0.84 0.88 0.88 1.24  CALCIUM 8.5* 8.0* 8.3* 8.4*  MG 1.8 1.9  --   --     Liver Function Tests: No results for input(s): AST, ALT, ALKPHOS, BILITOT, PROT, ALBUMIN in the last 168 hours.   Radiology Studies: No results found.    LOS: 16 days    Damarco Keysor  Lorella Gomez, MD [facility] 01/31/2022, 11:32 AM

## 2022-01-31 NOTE — Plan of Care (Signed)
  Problem: Education: Goal: Ability to demonstrate management of disease process will improve Outcome: Progressing Goal: Ability to verbalize understanding of medication therapies will improve Outcome: Progressing Goal: Individualized Educational Video(s) Outcome: Progressing   Problem: Activity: Goal: Capacity to carry out activities will improve Outcome: Progressing   Problem: Cardiac: Goal: Ability to achieve and maintain adequate cardiopulmonary perfusion will improve Outcome: Progressing   Problem: Education: Goal: Knowledge of General Education information will improve Description: Including pain rating scale, medication(s)/side effects and non-pharmacologic comfort measures Outcome: Progressing   Problem: Health Behavior/Discharge Planning: Goal: Ability to manage health-related needs will improve Outcome: Progressing   Problem: Clinical Measurements: Goal: Ability to maintain clinical measurements within normal limits will improve Outcome: Progressing Goal: Will remain free from infection Outcome: Progressing Goal: Diagnostic test results will improve Outcome: Progressing Goal: Respiratory complications will improve Outcome: Progressing Goal: Cardiovascular complication will be avoided Outcome: Progressing   Problem: Activity: Goal: Risk for activity intolerance will decrease Outcome: Progressing   Problem: Nutrition: Goal: Adequate nutrition will be maintained Outcome: Progressing   Problem: Coping: Goal: Level of anxiety will decrease Outcome: Progressing   

## 2022-01-31 NOTE — Progress Notes (Signed)
Physical Therapy Treatment ?Patient Details ?Name: Jeremiah [name] ?MRN: [id number] ?DOB: 1951/05/26 ?Today's Date: 01/31/2022 ? ? ?History of Present Illness Pt is a 71 y/o M presenting to ED on 2/25 wtih L shoulder wound with pain radiating to chest. Found to have L shoulder anteromedial dislocation after fall 3 months ago. S/p removal of reverse shoulder implant with antibiotic spacer on 2/28. PMH includes CAD s/p bypass, hx of PE and DVT on eliquis, HTN and alcohol abuse. ? ?  ?PT Comments  ? ? Pt continues to progress towards goals. Treatment limited d/t pt agitation and perseverance on problems. Pt stating "I just want to die" during ambulation, pt returned to room and RN and LCSW notified. Current plan remains appropriate to address deficits and maximize functional independence. Pt continues to benefit from skilled PT services to progress toward functional mobility goals.  ?  ?Recommendations for follow up therapy are one component of a multi-disciplinary discharge planning process, led by the attending physician.  Recommendations may be updated based on patient status, additional functional criteria and insurance authorization. ? ?Follow Up Recommendations ? Home health PT ?  ?  ?Assistance Recommended at Discharge Intermittent Supervision/Assistance  ?Patient can return home with the following A little help with walking and/or transfers;A little help with bathing/dressing/bathroom;Assist for transportation;Help with stairs or ramp for entrance ?  ?Equipment Recommendations ? Cane  ?  ?Recommendations for Other Services   ? ? ?  ?Precautions / Restrictions Precautions ?Precautions: Fall ?Required Braces or Orthoses: Sling ?Restrictions ?Weight Bearing Restrictions: Yes ?LUE Weight Bearing: Non weight bearing ?Other Position/Activity Restrictions: sling on at all times except for dressing/exercise, AROM of hand/wrist/elbow ok, no shoulder AROM/PROM per MD  ?  ? ?Mobility ? Bed Mobility ?  ?  ?  ?  ?  ?  ?   ?General bed mobility comments: pt in chair on arrival ?  ? ?Transfers ?Overall transfer level: Needs assistance ?Equipment used: Straight cane ?Transfers: Sit to/from Stand ?Sit to Stand: Supervision ?  ?  ?  ?  ?  ?General transfer comment: good technique, min gaurd to steady on rise ?  ? ?Ambulation/Gait ?Ambulation/Gait assistance: Min guard, Supervision ?Gait Distance (Feet): 500 Feet (no rest break) ?Assistive device: Straight cane ?Gait Pattern/deviations: Step-through pattern, Decreased step length - right, Decreased step length - left, Decreased stride length, Wide base of support ?Gait velocity: decreased ?  ?  ?General Gait Details: generalized unsteadiness upon standing, specifially when turning, wide base of support, can correct but states it feels unsteady ? ? ?Stairs ?  ?  ?  ?  ?  ? ? ?Wheelchair Mobility ?  ? ?Modified Rankin (Stroke Patients Only) ?  ? ? ?  ?Balance Overall balance assessment: Needs assistance ?  ?Sitting balance-Leahy Scale: Good ?  ?  ?Standing balance support: Single extremity supported ?Standing balance-Leahy Scale: Fair ?  ?  ?  ?  ?  ?  ?  ?  ?  ?  ?  ?  ?  ? ?  ?Cognition Arousal/Alertness: Awake/alert ?Behavior During Therapy: Agitated, Impulsive ?Overall Cognitive Status: Within Functional Limits for tasks assessed ?  ?  ?  ?  ?  ?  ?  ?  ?  ?  ?  ?  ?  ?  ?  ?  ?General Comments: pt with concerns about where he is discharging, not having an ID, pants not fitting, VERY aggitated. stating "i just want to die" RN and LCSW notified. ?  ?  ? ?  ?  Exercises   ? ?  ?General Comments   ?  ?  ? ?Pertinent Vitals/Pain Pain Assessment ?Pain Assessment: No/denies pain  ? ? ?Home Living   ?  ?  ?  ?  ?  ?  ?  ?  ?  ?   ?  ?Prior Function    ?  ?  ?   ? ?PT Goals (current goals can now be found in the care plan section) Acute Rehab PT Goals ?PT Goal Formulation: With patient ?Time For Goal Achievement: 02/02/22 ? ?  ?Frequency ? ? ? Min 5X/week ? ? ? ?  ?PT Plan    ? ? ?Co-evaluation    ?  ?  ?  ?  ? ?  ?AM-PAC PT "6 Clicks" Mobility   ?Outcome Measure ? Help needed turning from your back to your side while in a flat bed without using bedrails?: None ?Help needed moving from lying on your back to sitting on the side of a flat bed without using bedrails?: A Little ?Help needed moving to and from a bed to a chair (including a wheelchair)?: A Little ?Help needed standing up from a chair using your arms (e.g., wheelchair or bedside chair)?: A Little ?Help needed to walk in hospital room?: A Little ?Help needed climbing 3-5 steps with a railing? : A Little ?6 Click Score: 19 ? ?  ?End of Session Equipment Utilized During Treatment: Gait belt ?Activity Tolerance: Treatment limited secondary to agitation ?Patient left: in chair;with call bell/phone within reach;with chair alarm set ?Nurse Communication: Mobility status;Other (comment) (pt satement of "I just want to die") ?PT Visit Diagnosis: Unsteadiness on feet (R26.81);Muscle weakness (generalized) (M62.81);Pain ?Pain - Right/Left: Left ?Pain - part of body: Arm;Shoulder ?  ? ? ?Time: 1208-1218 ?PT Time Calculation (min) (ACUTE ONLY): 10 min ? ?Charges:  $Gait Training: 8-22 mins          ?          ? ?Jeremiah [name]. PTA ?Acute Rehabilitation Services ?Office: [phone] ? ? ? ?Betsey Holiday Brittin Janik ?01/31/2022, 12:29 PM ? ?

## 2022-01-31 NOTE — Progress Notes (Signed)
Nutrition Follow-up ? ?DOCUMENTATION CODES:  ?Severe malnutrition in context of social or environmental circumstances ? ?INTERVENTION:  ?Continue current diet as ordered, encourage PO intake ?Continue snacks between meals ?Ensure Max po 2x/d, each supplement provides 150 kcal and 30 grams of protein.  ?1 packet Juven BID, each packet provides 95 calories, 2.5 grams of protein (collagen), and 9.8 grams of carbohydrate (3 grams sugar) + micronutrients for wound healing ?MVI with minerals daily ?Vitamin C '500mg'$  and zinc '220mg'$  daily x 14 days ?Replete vitamin D and b6 for serum deficiencies ? ?NUTRITION DIAGNOSIS:  ?Severe Malnutrition related to social / environmental circumstances (etoh and drug use, hx of gastric bypass) as evidenced by severe fat depletion, severe muscle depletion. ?- remains applicable ? ?GOAL:  ?Patient will meet greater than or equal to 90% of their needs ?- nutrition supplements in place ? ?MONITOR:  ?PO intake, Labs, Supplement acceptance, Weight trends ? ?REASON FOR ASSESSMENT:  ?Consult ?Assessment of nutrition requirement/status, Poor PO, Wound healing ? ?ASSESSMENT:  ?71 y.o. male with history of CAD s/p CABGx4, HTN, HLD, CHF (EF 35-40%), GERD, gout, alcohol abuse, opioid dependency, hx of gastric bypass surgery, and CKD2, presented to ED with worsening left shoulder pain and left-sided neck pain from an injury which occurred ~a year ago. Noted pt is currently homeless and has been for about 6 months. ? ?2/28 - Op, left RSA implant removal with I&D of shoulder joint, placement of antibiotic spacer and wound vac. Hemovac drain placed ? ?Pt resting in bed at the time of assessment. Discussed intake since last visit. Pt concerned as he has been gaining weight this admission, states he has gained 20 lbs and he is concerned about his clothes fitting. Reviewed chart. Pt appears to have a slight gain of ~4 lbs from his admit weight of 190lb, but some fluctuations have been seen from day to day.   ? ?Pt reports he has skipped some meals, does not want to gain weight. Discussed the importance of adequate nutrition in wound healing. Will adjust nutrition supplements to removed ensure enlive and increase ensure max BID. Pt in agreement with the plan. Encouraged adequate protein on meal trays and to skip desserts. Pt also concerned with eating gravy and he has had a gout flare up -  will add preferences to dinning software to eliminate this from tray ? ?Average Meal Intake: ?2/26-3/1: 91% intake x 7 recorded meal ?3/2-3/6: 69% intake x 5 recorded meals ?3/7-3/13: 100% intake x 10 recorded meals ? ?Nutritionally Relevant Medications: ?Scheduled Meds: ? vitamin C  500 mg Oral Daily  ? cholecalciferol  1,000 Units Oral Daily  ? colchicine  0.6 mg Oral BID  ? docusate sodium  100 mg Oral BID  ? Ensure Enlive  237 mL Oral BID BM  ? furosemide  20 mg Oral Daily  ? multivitamin with minerals  1 tablet Oral Daily  ? JUVEN  1 packet Oral BID BM  ? pantoprazole  40 mg Oral BID  ? potassium chloride  40 mEq Oral Daily  ? Ensure Max Protein  11 oz Oral Daily  ? vitamin B-6  50 mg Oral Daily  ? rosuvastatin  40 mg Oral Daily  ? vitamin B-12  1,000 mcg Oral Daily  ? zinc sulfate  220 mg Oral Daily  ? ?Continuous Infusions: ? vancomycin 1,000 mg (01/31/22 1002)  ? ?PRN Meds: metoCLOPramide ? ?Labs Reviewed: ?BUN 50  ? ?Vitamin Labs ?Vitamin A 25.7 (normal, 3/2) ?Vitamin B1 108.8 (normal, 3/2) ?  Vitamin B6 2.0 (low, 3/2) ?Vitamin B12 176 (low, 2/26)  ?Vitamin C .6 (normal, 3/2) ?Vitamin D 13.65 (low, 3/2) ?Vitamin E 9.0 (alpha), 1.1 (gamma) (normal, 3/2) ?Vitamin K .46 (normal, 3/2) ?Folate 20.8 (normal, 2/26) ?Copper 97 (normal 3/2) ?Zinc 45 (normal, 3/2) ? ?NUTRITION - FOCUSED PHYSICAL EXAM: ?Flowsheet Row Most Recent Value  ?Orbital Region Severe depletion  ?Upper Arm Region Severe depletion  ?Thoracic and Lumbar Region Severe depletion  ?Buccal Region Severe depletion  ?Temple Region Severe depletion  ?Clavicle Bone Region  Mild depletion  ?Clavicle and Acromion Bone Region Mild depletion  ?Scapular Bone Region Mild depletion  ?Dorsal Hand Severe depletion  ?Patellar Region Moderate depletion  ?Anterior Thigh Region Moderate depletion  ?Posterior Calf Region Severe depletion  ?Edema (RD Assessment) Mild  ?Hair Reviewed  ?Eyes Reviewed  ?Mouth Reviewed  ?Skin Reviewed  [scattered brusing]  ?Nails Reviewed  ? ?Diet Order:   ?Diet Order   ? ?       ?  Diet regular Room service appropriate? Yes; Fluid consistency: Thin  Diet effective now       ?  ? ?  ?  ? ?  ? ? ?EDUCATION NEEDS:  ?Education needs have been addressed ? ?Skin:  Skin Assessment: Skin Integrity Issues: ?Skin Integrity Issues:: Other (Comment), Incisions ?Incisions: left shoulder ?Other: abrasions to the head, legs, and shoulders; ecchymosis to the bilateral arms and legs ? ?Last BM:  3/8 - type 4 ? ?Height:  ?Ht Readings from Last 1 Encounters:  ?01/18/22 '5\' 7"'$  (1.702 m)  ? ? ?Weight:  ?Wt Readings from Last 1 Encounters:  ?01/27/22 88 kg  ? ? ?Ideal Body Weight:  67.3 kg ? ?BMI:  Body mass index is 30.39 kg/m?. ? ?Estimated Nutritional Needs:  ?Kcal:  2200-2500 kcal/d ?Protein:  120-135g/d ?Fluid:  2.3-2.5 L/d ? ? ?Ranell Patrick, RD, LDN ?Clinical Dietitian ?RD pager # available in [place]  ?After hours/weekend pager # available in [place] ?

## 2022-02-01 DIAGNOSIS — F1010 Alcohol abuse, uncomplicated: Secondary | ICD-10-CM | POA: Diagnosis not present

## 2022-02-01 DIAGNOSIS — I5043 Acute on chronic combined systolic (congestive) and diastolic (congestive) heart failure: Secondary | ICD-10-CM | POA: Diagnosis not present

## 2022-02-01 DIAGNOSIS — S41002A Unspecified open wound of left shoulder, initial encounter: Secondary | ICD-10-CM | POA: Diagnosis not present

## 2022-02-01 DIAGNOSIS — I483 Typical atrial flutter: Secondary | ICD-10-CM | POA: Diagnosis not present

## 2022-02-01 LAB — CBC
HCT: 24.9 % — ABNORMAL LOW (ref 39.0–52.0)
Hemoglobin: 7.6 g/dL — ABNORMAL LOW (ref 13.0–17.0)
MCH: 25.9 pg — ABNORMAL LOW (ref 26.0–34.0)
MCHC: 30.5 g/dL (ref 30.0–36.0)
MCV: 85 fL (ref 80.0–100.0)
Platelets: 373 10*3/uL (ref 150–400)
RBC: 2.93 MIL/uL — ABNORMAL LOW (ref 4.22–5.81)
RDW: 17.2 % — ABNORMAL HIGH (ref 11.5–15.5)
WBC: 6.3 10*3/uL (ref 4.0–10.5)
nRBC: 0 % (ref 0.0–0.2)

## 2022-02-01 LAB — MAGNESIUM: Magnesium: 1.9 mg/dL (ref 1.7–2.4)

## 2022-02-01 LAB — BASIC METABOLIC PANEL
Anion gap: 4 — ABNORMAL LOW (ref 5–15)
BUN: 40 mg/dL — ABNORMAL HIGH (ref 8–23)
CO2: 27 mmol/L (ref 22–32)
Calcium: 8.4 mg/dL — ABNORMAL LOW (ref 8.9–10.3)
Chloride: 107 mmol/L (ref 98–111)
Creatinine, Ser: 0.85 mg/dL (ref 0.61–1.24)
GFR, Estimated: >60 mL/min (ref >60)
Glucose, Bld: 90 mg/dL (ref 70–99)
Potassium: 4.3 mmol/L (ref 3.5–5.1)
Sodium: 138 mmol/L (ref 135–145)

## 2022-02-01 MED ORDER — VANCOMYCIN HCL 1250 MG/250ML IV SOLN
1250.0000 mg | INTRAVENOUS | Status: DC
  Administered 2022-02-01 – 2022-02-18 (×18): 1250 mg via INTRAVENOUS
  Filled 2022-02-01 (×20): qty 250

## 2022-02-01 MED ORDER — ALPRAZOLAM 0.25 MG PO TABS
1.0000 mg | ORAL_TABLET | Freq: Four times a day (QID) | ORAL | Status: DC | PRN
  Administered 2022-02-01 – 2022-02-12 (×36): 1 mg via ORAL
  Filled 2022-02-01 (×36): qty 4

## 2022-02-01 NOTE — Progress Notes (Signed)
Mobility Specialist Progress Note: ? ? 02/01/22 1056  ?Mobility  ?Activity Ambulated with assistance in hallway  ?Level of Assistance Modified independent, requires aide device or extra time  ?Assistive Device [place]  ?Distance Ambulated (ft) 570 ft  ?Activity Response Tolerated well  ?$Mobility charge 1 Mobility  ? ?Pt received in chair willing to participate in mobility. Complaints of 7/10 L shoulder pain and 8/10 L ankle pain. Required 1 seated rest break. Left in chair with call bell in reach and all needs met.  ? ?Aaryanna Hyden ?Mobility Specialist ?Primary Phone [phone] ? ?

## 2022-02-01 NOTE — Progress Notes (Signed)
Physical Therapy Treatment ?Patient Details ?Name: Jeremiah [name] ?MRN: [id number] ?DOB: 1951/08/04 ?Today's Date: 02/01/2022 ? ? ?History of Present Illness Pt is a 71 y/o M presenting to ED on 2/25 wtih L shoulder wound with pain radiating to chest. Found to have L shoulder anteromedial dislocation after fall 3 months ago. S/p removal of reverse shoulder implant with antibiotic spacer on 2/28. PMH includes CAD s/p bypass, hx of PE and DVT on eliquis, HTN and alcohol abuse. ? ?  ?PT Comments  ? ? Pt continues to make good progress towards goals with increased steadiness during ambulation and increased distance tolerated without need for seated rest break. Pt continues to perseverate on problems but able to redirect during session. Pt continues to benefit from skilled PT services to progress toward functional mobility goals.  ?  ?Recommendations for follow up therapy are one component of a multi-disciplinary discharge planning process, led by the attending physician.  Recommendations may be updated based on patient status, additional functional criteria and insurance authorization. ? ?Follow Up Recommendations ? Home health PT ?  ?  ?Assistance Recommended at Discharge Intermittent Supervision/Assistance  ?Patient can return home with the following A little help with walking and/or transfers;A little help with bathing/dressing/bathroom;Assist for transportation;Help with stairs or ramp for entrance ?  ?Equipment Recommendations ? Cane  ?  ?Recommendations for Other Services   ? ? ?  ?Precautions / Restrictions Precautions ?Precautions: Fall ?Required Braces or Orthoses: Sling ?Restrictions ?Weight Bearing Restrictions: Yes ?LUE Weight Bearing: Non weight bearing ?Other Position/Activity Restrictions: sling on at all times except for dressing/exercise, AROM of hand/wrist/elbow ok, no shoulder AROM/PROM per MD  ?  ? ?Mobility ? Bed Mobility ?  ?  ?  ?  ?  ?  ?  ?General bed mobility comments: pt in chair on arrival ?   ? ?Transfers ?Overall transfer level: Needs assistance ?Equipment used: Straight cane ?Transfers: Sit to/from Stand ?Sit to Stand: Supervision ?  ?  ?  ?  ?  ?General transfer comment: good technique, min gaurd to steady on rise ?  ? ?Ambulation/Gait ?Ambulation/Gait assistance: Min guard, Supervision ?Gait Distance (Feet): 540 Feet ?Assistive device: Straight cane ?Gait Pattern/deviations: Step-through pattern, Decreased step length - right, Decreased step length - left, Decreased stride length, Wide base of support ?Gait velocity: decreased ?  ?  ?General Gait Details: generalized unsteadiness upon standing, specifially when turning, wide base of support, can correct but states it feels unsteady ? ? ?Stairs ?  ?  ?  ?  ?  ? ? ?Wheelchair Mobility ?  ? ?Modified Rankin (Stroke Patients Only) ?  ? ? ?  ?Balance Overall balance assessment: Needs assistance ?  ?Sitting balance-Leahy Scale: Good ?  ?  ?Standing balance support: Single extremity supported ?Standing balance-Leahy Scale: Fair ?  ?  ?  ?  ?  ?  ?  ?  ?  ?  ?  ?  ?  ? ?  ?Cognition Arousal/Alertness: Awake/alert ?Behavior During Therapy: Agitated, Impulsive ?Overall Cognitive Status: Within Functional Limits for tasks assessed ?  ?  ?  ?  ?  ?  ?  ?  ?  ?  ?  ?  ?  ?  ?  ?  ?General Comments: pt with concerns about where he is discharging, not having an ID, pants not fitting, VERY aggitated. stating "i just want to die" RN and LCSW notified. ?  ?  ? ?  ?Exercises   ? ?  ?General  Comments   ?  ?  ? ?Pertinent Vitals/Pain Pain Assessment ?Pain Assessment: No/denies pain  ? ? ?Home Living   ?  ?  ?  ?  ?  ?  ?  ?  ?  ?   ?  ?Prior Function    ?  ?  ?   ? ?PT Goals (current goals can now be found in the care plan section) Acute Rehab PT Goals ?PT Goal Formulation: With patient ?Time For Goal Achievement: 02/02/22 ? ?  ?Frequency ? ? ? Min 5X/week ? ? ? ?  ?PT Plan    ? ? ?Co-evaluation   ?  ?  ?  ?  ? ?  ?AM-PAC PT "6 Clicks" Mobility   ?Outcome Measure ? Help  needed turning from your back to your side while in a flat bed without using bedrails?: None ?Help needed moving from lying on your back to sitting on the side of a flat bed without using bedrails?: A Little ?Help needed moving to and from a bed to a chair (including a wheelchair)?: A Little ?Help needed standing up from a chair using your arms (e.g., wheelchair or bedside chair)?: A Little ?Help needed to walk in hospital room?: A Little ?Help needed climbing 3-5 steps with a railing? : A Little ?6 Click Score: 19 ? ?  ?End of Session Equipment Utilized During Treatment: Gait belt ?Activity Tolerance: Treatment limited secondary to agitation ?Patient left: in chair;with call bell/phone within reach;with chair alarm set ?Nurse Communication: Mobility status;Other (comment) (pt satement of "I just want to die") ?PT Visit Diagnosis: Unsteadiness on feet (R26.81);Muscle weakness (generalized) (M62.81);Pain ?Pain - Right/Left: Left ?Pain - part of body: Arm;Shoulder ?  ? ? ?Time: 5631-4970 ?PT Time Calculation (min) (ACUTE ONLY): 17 min ? ?Charges:  $Therapeutic Exercise: 8-22 mins          ?          ? ?[name]. PTA ?Acute Rehabilitation Services ?Office: [phone] ? ? ? ?Betsey Holiday Patric Buckhalter ?02/01/2022, 4:28 PM ? ?

## 2022-02-01 NOTE — Progress Notes (Signed)
Pharmacy Antibiotic Note ? ?Jeremiah [name] is a 71 y.o. male admitted on 01/15/2022 with  MRSA L shoulder joint infection .  Pharmacy has been consulted for Vancomycin dosing. ? ?Scr back down to 0.85 (~baseline) ? ?Plan: ?Change Vanc back to 1250 MG IV Q24H ?Levels at Css ?BMET q72h while on vancomycin ? ?Height: '5\' 7"'$  (170.2 cm) ?Weight: 89.2 kg (196 lb 10.4 oz) ?IBW/kg (Calculated) : 66.1 ? ?Temp (24hrs), Avg:98.2 ?F (36.8 ?C), Min:98.1 ?F (36.7 ?C), Max:98.3 ?F (36.8 ?C) ? ?Recent Labs  ?Lab 01/26/22 ?1610 01/27/22 ?0216 01/27/22 ?2259 01/28/22 ?0320 01/29/22 ?0111 02/01/22 ?9604  ?WBC 5.3 7.7  --  5.5 5.6 6.3  ?CREATININE 0.88  --   --  0.88 1.24 0.85  ?[place]  --   --  20  --   --   --   ?VANCOPEAK  --  31  --   --   --   --   ?  ?Estimated Creatinine Clearance: 87.4 mL/min (by C-G formula based on SCr of 0.85 mg/dL).   ? ?Allergies  ?Allergen Reactions  ? Morphine Hives, Swelling and Rash  ?  Has taken with no reaction since original reaction  ? ? ?Antimicrobials this admission: ?Vanc 2/26 >> ?Doxy PTA 2/14 >> 2/25 ? ?Dose adjustments this admission: ?3/3 VP/VT 26/18, AUC 558 on '750mg'$  q12 ?3/9 VP/VT 31/20, AUC 618 >> change to '1250mg'$  q24 ?3/11 SCr bump >>change to '1000mg'$  q24 ?3/14 SCr downt o 0.85>>back to '1250mg'$  q24h ? ?Microbiology: ?2/26 MRSA PCR - positive ?2/27 abscess - MRSA ?2/28 L joint fluid, swab A - negative ?2/28 L joint fluid, swab B - negative ?2/28 L joint fluid, swab C - negative ? ?Thank you for allowing pharmacy to be a part of this patient?s care. ? ?Franky Macho ?02/01/2022 9:01 AM ? ?

## 2022-02-01 NOTE — Progress Notes (Signed)
Occupational Therapy Treatment ?Patient Details ?Name: Jeremiah [name] ?MRN: [id number] ?DOB: 1951/07/06 ?Today's Date: 02/01/2022 ? ? ?History of present illness Pt is a 71 y/o M presenting to ED on 2/25 wtih L shoulder wound with pain radiating to chest. Found to have L shoulder anteromedial dislocation after fall 3 months ago. S/p removal of reverse shoulder implant with antibiotic spacer on 2/28. PMH includes CAD s/p bypass, hx of PE and DVT on eliquis, HTN and alcohol abuse. ?  ?OT comments ? Pt seen for session, making slow progress towards goals. Pt mod A for UB dressing, able to walk hallway distance with SPC. Performed AROM/AAROM/PROM of hand, wrist, and elbow. Pt with limited active elbow flexion. Pt notes he has not been doing RUE exercises, educated pt on importance of performing ROM for elbow/wrist/hand to prevent edema and joint stiffness. Pt verbalized understanding, provided written HEP however would benefit from reinforcement. Pt presenting with impairments listed below, will follow acutely.   ? ?Recommendations for follow up therapy are one component of a multi-disciplinary discharge planning process, led by the attending physician.  Recommendations may be updated based on patient status, additional functional criteria and insurance authorization. ?   ?Follow Up Recommendations ? Follow physician's recommendations for discharge plan and follow up therapies  ?  ?Assistance Recommended at Discharge Intermittent Supervision/Assistance  ?Patient can return home with the following ? A little help with walking and/or transfers;Assistance with cooking/housework;Direct supervision/assist for financial management;Assist for transportation;Help with stairs or ramp for entrance;A lot of help with bathing/dressing/bathroom ?  ?Equipment Recommendations ? None recommended by OT  ?  ?Recommendations for Other Services   ? ?  ?Precautions / Restrictions Precautions ?Precautions: Fall ?Required Braces or Orthoses:  Sling ?Restrictions ?Weight Bearing Restrictions: Yes ?LUE Weight Bearing: Non weight bearing ?Other Position/Activity Restrictions: sling on at all times except for dressing/exercise, AROM of hand/wrist/elbow ok, no shoulder AROM/PROM per MD  ? ? ?  ? ?Mobility Bed Mobility ?  ?  ?  ?  ?  ?  ?  ?General bed mobility comments: pt in chair on arrival ?  ? ?Transfers ?Overall transfer level: Needs assistance ?Equipment used: Straight cane ?Transfers: Sit to/from Stand ?Sit to Stand: Supervision ?  ?  ?  ?  ?  ?  ?  ?  ?Balance Overall balance assessment: Needs assistance ?  ?Sitting balance-Leahy Scale: Good ?  ?  ?Standing balance support: Single extremity supported ?Standing balance-Leahy Scale: Fair ?  ?  ?  ?  ?  ?  ?  ?  ?  ?  ?  ?  ?   ? ?ADL either performed or assessed with clinical judgement  ? ?ADL Overall ADL's : Needs assistance/impaired ?  ?  ?  ?  ?  ?  ?  ?  ?Upper Body Dressing : Moderate assistance;Sitting ?Upper Body Dressing Details (indicate cue type and reason): cues needed to keep shoulder immobilized during gown change ?  ?  ?Toilet Transfer: Min guard;Ambulation;Comfort height toilet ?Toilet Transfer Details (indicate cue type and reason): simulated in room ?  ?  ?  ?  ?Functional mobility during ADLs: Min guard;Cane ?  ?  ? ?Extremity/Trunk Assessment Upper Extremity Assessment ?Upper Extremity Assessment: LUE deficits/detail ?LUE Deficits / Details: AROM of hand/wrist WFL, elbow PROM WFL, minimal AROM of elbow, unable to assess shoulder, immobilized ?  ?Lower Extremity Assessment ?Lower Extremity Assessment: Defer to PT evaluation ?  ?  ?  ? ?Vision   ?Vision Assessment?:  No apparent visual deficits ?  ?Perception Perception ?Perception: Not tested ?  ?Praxis Praxis ?Praxis: Not tested ?  ? ?Cognition Arousal/Alertness: Awake/alert ?Behavior During Therapy: Restless ?Overall Cognitive Status: Within Functional Limits for tasks assessed ?  ?  ?  ?  ?  ?  ?  ?  ?  ?  ?  ?  ?  ?  ?  ?  ?General  Comments: pt solomnent about spouse passing, stating he is still grieving. Concerns about d/c setting ?  ?  ?   ?Exercises Exercises: General Upper Extremity ?General Exercises - Upper Extremity ?Elbow Flexion: Left, 10 reps, Seated, AAROM ?Elbow Extension: AAROM, Left, 10 reps, Seated ?Wrist Flexion: 10 reps, Left, Seated, AROM ?Wrist Extension: AROM, Left, 10 reps, Seated ?Digit Composite Flexion: AROM, Left, 10 reps, Seated ?Composite Extension: AROM, Left, 10 reps, Seated ?Other Exercises ?Other Exercises: supination/pronation AROM L x10 ? ?  ?Shoulder Instructions   ? ? ?  ?General Comments Reviewed and provided paper copy of exercises for pt, reports he has not been doing them. Educated pt on importance of keeping hand/wrist/elbow moving to prevent edema/stiffness. Pt verbalized understanding  ? ? ?Pertinent Vitals/ Pain       Pain Assessment ?Pain Assessment: No/denies pain ?Pain Score: 7  ?Faces Pain Scale: Hurts even more ?Pain Location: L shoulder ?Pain Descriptors / Indicators: Aching ?Pain Intervention(s): Limited activity within patient's tolerance, Monitored during session, Repositioned ? ?Home Living   ?  ?  ?  ?  ?  ?  ?  ?  ?  ?  ?  ?  ?  ?  ?  ?  ?  ?  ? ?  ?Prior Functioning/Environment    ?  ?  ?  ?   ? ?Frequency ? Min 3X/week  ? ? ? ? ?  ?Progress Toward Goals ? ?OT Goals(current goals can now be found in the care plan section) ? Progress towards OT goals: Progressing toward goals ? ?Acute Rehab OT Goals ?Patient Stated Goal: to get better ?OT Goal Formulation: With patient ?Time For Goal Achievement: 02/15/22 ?Potential to Achieve Goals: Good ?ADL Goals ?Pt Will Perform Grooming: Independently;standing ?Pt Will Perform Upper Body Dressing: with min assist;sitting;standing  ?Plan Discharge plan remains appropriate;Frequency needs to be updated   ? ?Co-evaluation ? ? ?   ?  ?  ?  ?  ? ?  ?AM-PAC OT "6 Clicks" Daily Activity     ?Outcome Measure ? ? Help from another person eating meals?:  None ?Help from another person taking care of personal grooming?: A Little ?Help from another person toileting, which includes using toliet, bedpan, or urinal?: A Little ?Help from another person bathing (including washing, rinsing, drying)?: A Lot ?Help from another person to put on and taking off regular upper body clothing?: A Little ?Help from another person to put on and taking off regular lower body clothing?: A Lot ?6 Click Score: 17 ? ?  ?End of Session Equipment Utilized During Treatment: Other (comment) (cane) ? ?OT Visit Diagnosis: Unsteadiness on feet (R26.81);Other abnormalities of gait and mobility (R26.89);Muscle weakness (generalized) (M62.81);Pain ?  ?Activity Tolerance Patient tolerated treatment well ?  ?Patient Left in chair;with call bell/phone within reach;with chair alarm set ?  ?Nurse Communication Mobility status;Other (comment) (notified of wound in pt's elbow crease area, RN ordering interdry) ?  ? ?   ? ?Time: 1610-9604 ?OT Time Calculation (min): 46 min ? ?Charges: OT General Charges ?$OT Visit: 1 Visit ?OT Treatments ?$  Self Care/Home Management : 8-22 mins ?$Therapeutic Activity: 8-22 mins ?$Therapeutic Exercise: 8-22 mins ? ?Lynnda Child, OTD, OTR/L ?Acute Rehab ?[phone] ? ? ?Kaylyn Lim ?02/01/2022, 4:44 PM ?

## 2022-02-01 NOTE — Progress Notes (Signed)
?PROGRESS NOTE ? ? ? ?Jeremiah [name]  NKN:[id number] DOB: 1951/03/20 DOA: 01/15/2022 ?PCP: Carol Ada, MD  ? ? ?Brief Narrative:  ?Patient is a 71 y.o. male with medical history significant of CAD s/p bypass in 1998, Hx of PE and DVT on Eliquis, HTN, alcohol abuse with hx of withdrawal, opioid dependency previous on Suboxone presented to the hospital with left shoulder and neck pain.  Reportedly patient lost his wife last year and moved out to [place] to live with his brother. However did not get along with his sister-in-law and has recently moved back here to [place] l a week prior to presentation and noted to be homeless and living in [place]. Noted to have multiple admissions recently in [place] for alcohol withdrawal.  On 10/2021 he was admitted following a fall and was noted to have a large left-sided chest wall hematoma  had multiple I&D and wound VAC in January due to persistent refractory hematoma, found to have chronically dislocated left shoulder arthroplasty in the setting of likely infection.  On 02/06/2021-CTA showed large burden of thromboembolic disease with central pulmonary artery distribution.  Saw Pulm in office with Dr Janett Billow at [facility]- who noted 'it sounds like his pulmonary embolism was associated with some lower extremity cellulitis which he was hospitalized and minimally mobile. He was treated with Eliquis, now off.  In the ED on this admission patient was hemodynamically stable.  Labs showed some mild anemia of hemoglobin 9.1.  EKG showed atrial flutter.  Troponin was borderline.  Left shoulder x-ray showed anterior dislocation of left reverse total shoulder arthroplasty.  Patient had CTA chest on 01/16/2022 which showed no evidence of pulmonary embolism but pulmonary hypertension and cardiomegaly.  There was note of 4.7 cm thoracic aortic aneurysm.  Radiology recommended dysemia [place] CT or MRI follow-up and referral to cardiothoracic surgery as outpatient.   Patient was also seen by orthopedics during hospitalization  and the  CT of left shoulder-showed postsurgical changes and dislocation, large periprosthetic fluid collection surrounding the proximal humerus and extending throughout the left axillary region up to 7.8 X4.4X 8.8-sequelae of postoperative seroma, hematoma versus particle disease.  Patient had 2D echocardiogram on 01/16/2022 which showed reduced LV function and cardiology was consulted.  On 01/17/2022 CT-guided biopsy of the left anterior axillary fluid was done with removal of 30 mils of cloudy fluid with.  Patient underwent removal of shoulder implant with excision of sinus tract antibiotic spacer placement and wound VAC placement by Dr. Marlou Sa on 01/18/2022.  On 01/19/2022 infectious disease was consulted who recommended prolonged IV antibiotic course. Patient has homelessness issues which has been a barrier for his disposition as well.  On 01/26/2022, patient was noted to be anemic and received 1 unit of packed RBC.  Otherwise he has remained stable except for ongoing pain issues.  ? ?  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 ?CT of the left shoulder showed fluid collection and underwent CT-guided biopsy on 01/17/2022 with removal of 30 mil of fluid status post removal of hardware, irrigation/debridement and placement of antibiotic spacer with 6-8 weeks of IV antibiotic.  Orthopedics and infectious disease followed the patient during hospitalization.  Orthopedics currently following. ? ?Acute on chronic combined systolic and diastolic CHF (congestive heart failure) ([place]) ?Acute on chronic systolic CHF with EF 35 to  40% ?Moderate to severe aortic regurgitation on prior echo now mild ?Aortic root 4.27 cm ?Medical  nonadherence ?Lower leg edema: ?Patient was supposed to be on Bumex which he ran out for several months.  Continue CHF protocol.  Seen by cardiology during this hospitalization.  Net IO Since Admission: -24,480.89 mL [02/01/22 1058]  On oral lasix daily low dose.  Appears compensated at this time. ? ?Alcohol abuse ?No withdrawal symptoms at this time..  Continue thiamine, folic acid. ? ?Normocytic anemia ?Latest hemoglobin was 7.6 after 1 unit of packed RBC transfusion.   Continue vitamin B12. ? ?CAD (coronary artery disease) ?CAD s/p cabg x4 1998 ?Chronic trifascicular block ?Hyperlipidemia ?Continue aspirin and statins.  Hold AV nodal blockers.  ? ?Wrist pain ?Left wrist pain.  Seen by orthopedics.   Uric acid of 4.4.  X-ray of the left wrist shows severe osteoarthritis.  Orthopedic recommended  3-day course of colchicine which he has completed.  Has improved at this time. ? ?Hypokalemia ?Improved at this time.  Latest potassium of 4.3. ? ?Back pain ?Chronic back pain, spinal stenosis.   Patient does have high tolerance to pain.  On pain medication regimen oxycodone every 4 hourly.  Continue Lidoderm patch, gabapentin 3 times daily.  resumed tizanidine. ? ?Protein-calorie malnutrition, severe ?Severe protein calorie malnutrition.   ?Present on admission.  Nutrition on board.  Continue supplements.  Encourage oral nutrition. ? ?Thoracic aortic aneurysm (TAA) ?CTA incidentally showed a 4.7 cm thoracic aortic aneurysm, will need semiannual CT or MRI imaging/cardiothoracic surgery follow-up as outpatient ? ?Hypotension ?[name]  Not on nodal blockers.  Might need antihypertensive if continues to remain high. ? ? ?B12 deficiency ?Continue vitamin B12 replacement. ? ?History of pulmonary embolism ?Completed treatment with Eliquis.  Not currently on anticoagulation due to risk of falls homelessness, history of hematoma, ongoing alcohol abuse. ? ?Depression ?Continue hydroxyzine, Wellbutrin, buspirone. ? ?Atrial flutter  ([place]) ?New onset a flutter in the ED ?Asymptomatic bradycardia during the night 2/26: ?TSH was within normal limit.  Patient did have pause for 2.90 seconds.  Cardiology was consulted.  Avoid nodal blockers.  Continue anticoagulation.  Rate controlled at this time. ? ? ? DVT prophylaxis: enoxaparin (LOVENOX) injection 40 mg Start: 01/19/22 1400 ?SCD's Start: 01/18/22 2238 ?Place TED hose Start: 01/18/22 2238 ?SCDs Start: 01/15/22 2347 ? ? ?Code Status:   ?  Code Status: Full Code ? ?Disposition: Patient is currently homeless, pending arrangement for antibiotic and housing ? ?Status is: Inpatient ? ?Remains inpatient appropriate because:  need for IV antibiotics, pending disposition plan ? ? Family Communication:  ?Spoke with the patient at bedside. ? ?Consultants:  ?Orthopedics ?Cardiology ? ?Procedures:  ?CT-guided aspiration on 01/17/2022 ?Total shoulder arthroplasty with antibiotic spacer placement on 01/18/2022 by Dr. Marlou Sa orthopedics  ? ?Antimicrobials:  ?Vancomycin IV ? ?Anti-infectives (From admission, onward)  ? ? Start     Dose/Rate Route Frequency Ordered Stop  ? 02/01/22 1000  vancomycin (VANCOREADY) IVPB 1250 mg/250 mL       ? 1,250 mg ?166.7 mL/hr over 90 Minutes Intravenous Every 24 hours 02/01/22 0851    ? 01/29/22 1000  vancomycin (VANCOCIN) IVPB 1000 mg/200 mL premix  Status:  Discontinued       ? 1,000 mg ?200 mL/hr over 60 Minutes Intravenous Every 24 hours 01/29/22 0904 02/01/22 0851  ? 01/28/22 1500  vancomycin (VANCOREADY) IVPB 1250 mg/250 mL  Status:  Discontinued       ? 1,250 mg ?166.7 mL/hr over 90 Minutes Intravenous Every  24 hours 01/28/22 1010 01/29/22 0904  ? 01/19/22 1030  vancomycin (VANCOREADY) IVPB 750 mg/150 mL  Status:  Discontinued       ? 750 mg ?150 mL/hr over 60 Minutes Intravenous Every 12 hours 01/19/22 1027 01/28/22 1004  ? 01/18/22 1925  vancomycin (VANCOCIN) powder  Status:  Discontinued       ?   As needed 01/18/22 1925 01/18/22 2103  ? 01/16/22 2200  vancomycin  (VANCOREADY) IVPB 1250 mg/250 mL  Status:  Discontinued       ? 1,250 mg ?166.7 mL/hr over 90 Minutes Intravenous Every 24 hours 01/16/22 0107 01/19/22 1027  ? 01/16/22 0030  vancomycin (VANCOREADY) IVPB 1500 mg/300 mL       ? 1,500

## 2022-02-01 NOTE — Progress Notes (Signed)
Patient evaluated.  Left wrist pain significantly improved compared with last week.  Discontinued colchicine.  No continued warmth to the left wrist and swelling is improved.  Shoulder still anteriorly subluxed but no evidence of cellulitis around the incision.  Axillary nerve is still firing.  2+ radial pulse of the operative extremity.  He does complain of increased right shoulder pain and states he has a history of some rotator cuff tendinopathy which she would like addressed at some point in the future.  Discussed that we should work on addressing this infection before any more elective surgery.  He agreed with this plan. ?

## 2022-02-02 DIAGNOSIS — I483 Typical atrial flutter: Secondary | ICD-10-CM | POA: Diagnosis not present

## 2022-02-02 DIAGNOSIS — S41002A Unspecified open wound of left shoulder, initial encounter: Secondary | ICD-10-CM | POA: Diagnosis not present

## 2022-02-02 DIAGNOSIS — I5043 Acute on chronic combined systolic (congestive) and diastolic (congestive) heart failure: Secondary | ICD-10-CM | POA: Diagnosis not present

## 2022-02-02 DIAGNOSIS — F1010 Alcohol abuse, uncomplicated: Secondary | ICD-10-CM | POA: Diagnosis not present

## 2022-02-02 NOTE — Progress Notes (Signed)
Physical Therapy Treatment ?Patient Details ?Name: Jeremiah [name] ?MRN: [id number] ?DOB: [birth year] ?Today's Date: 02/02/2022 ? ? ?History of Present Illness Pt is a 71 y/o M presenting to ED on 2/25 wtih L shoulder wound with pain radiating to chest. Found to have L shoulder anteromedial dislocation after fall 3 months ago. S/p removal of reverse shoulder implant with antibiotic spacer on 2/28. PMH includes CAD s/p bypass, hx of PE and DVT on eliquis, HTN and alcohol abuse. ? ?  ?PT Comments  ? ? Pt agreeable to session with good progression of functional mobility with pt requiring up to min assist to ascend/descend full flight of stairs in stairwell with no LOB. Pt continues to demonstrate some generalized unsteadiness with ambulation but with no noted LOB throughout session. Pt continues to benefit from skilled PT services to progress toward functional mobility goals.  ?  ?Recommendations for follow up therapy are one component of a multi-disciplinary discharge planning process, led by the attending physician.  Recommendations may be updated based on patient status, additional functional criteria and insurance authorization. ? ?Follow Up Recommendations ? Home health PT ?  ?  ?Assistance Recommended at Discharge Intermittent Supervision/Assistance  ?Patient can return home with the following A little help with walking and/or transfers;A little help with bathing/dressing/bathroom;Assist for transportation;Help with stairs or ramp for entrance ?  ?Equipment Recommendations ? Cane  ?  ?Recommendations for Other Services   ? ? ?  ?Precautions / Restrictions Precautions ?Precautions: Fall ?Required Braces or Orthoses: Sling ?Restrictions ?Weight Bearing Restrictions: Yes ?LUE Weight Bearing: Non weight bearing ?Other Position/Activity Restrictions: sling on at all times except for dressing/exercise, AROM of hand/wrist/elbow ok, no shoulder AROM/PROM per MD  ?  ? ?Mobility ? Bed Mobility ?  ?  ?  ?  ?  ?  ?  ?General bed  mobility comments: pt in chair on arrival ?  ? ?Transfers ?Overall transfer level: Needs assistance ?Equipment used: Straight cane ?Transfers: Sit to/from Stand ?Sit to Stand: Supervision ?  ?  ?  ?  ?  ?General transfer comment: good technique, min gaurd to steady on rise ?  ? ?Ambulation/Gait ?Ambulation/Gait assistance: Min guard, Supervision ?Gait Distance (Feet): 510 Feet ?Assistive device: Straight cane ?Gait Pattern/deviations: Step-through pattern, Decreased step length - right, Decreased step length - left, Decreased stride length, Wide base of support ?Gait velocity: decreased ?  ?  ?General Gait Details: generalized unsteadiness upon standing, specifially when turning, wide base of support, can correct but states it feels unsteady ? ? ?Stairs ?Stairs: Yes ?Stairs assistance: Min guard, Min assist ?Stair Management: One rail Right, Forwards ?Number of Stairs: 12 ?General stair comments: ascending/descending flight in stairwell, min guard for safety, min a for sequencing cues ? ? ?Wheelchair Mobility ?  ? ?Modified Rankin (Stroke Patients Only) ?  ? ? ?  ?Balance Overall balance assessment: Needs assistance ?  ?Sitting balance-Leahy Scale: Good ?  ?  ?Standing balance support: Single extremity supported ?Standing balance-Leahy Scale: Fair ?  ?  ?  ?  ?  ?  ?  ?  ?  ?  ?  ?  ?  ? ?  ?Cognition Arousal/Alertness: Awake/alert ?Behavior During Therapy: Agitated, Impulsive ?Overall Cognitive Status: Within Functional Limits for tasks assessed ?  ?  ?  ?  ?  ?  ?  ?  ?  ?  ?  ?  ?  ?  ?  ?  ?General Comments: continued perseveration on all problems ?  ?  ? ?  ?  Exercises   ? ?  ?General Comments   ?  ?  ? ?Pertinent Vitals/Pain Pain Assessment ?Pain Assessment: Faces ?Faces Pain Scale: Hurts little more ?Pain Location: L shoulder ?Pain Descriptors / Indicators: Aching ?Pain Intervention(s): Limited activity within patient's tolerance, Monitored during session, Repositioned, Patient requesting pain meds-RN notified   ? ? ?Home Living   ?  ?  ?  ?  ?  ?  ?  ?  ?  ?   ?  ?Prior Function    ?  ?  ?   ? ?PT Goals (current goals can now be found in the care plan section) Acute Rehab PT Goals ?PT Goal Formulation: With patient ?Time For Goal Achievement: 02/02/22 ? ?  ?Frequency ? ? ? Min 5X/week ? ? ? ?  ?PT Plan    ? ? ?Co-evaluation   ?  ?  ?  ?  ? ?  ?AM-PAC PT "6 Clicks" Mobility   ?Outcome Measure ? Help needed turning from your back to your side while in a flat bed without using bedrails?: None ?Help needed moving from lying on your back to sitting on the side of a flat bed without using bedrails?: A Little ?Help needed moving to and from a bed to a chair (including a wheelchair)?: A Little ?Help needed standing up from a chair using your arms (e.g., wheelchair or bedside chair)?: A Little ?Help needed to walk in hospital room?: A Little ?Help needed climbing 3-5 steps with a railing? : A Little ?6 Click Score: 19 ? ?  ?End of Session Equipment Utilized During Treatment: Gait belt ?Activity Tolerance: Treatment limited secondary to agitation ?Patient left: in chair;with call bell/phone within reach;with chair alarm set ?Nurse Communication: Mobility status;Other (comment) (pt satement of "I just want to die") ?PT Visit Diagnosis: Unsteadiness on feet (R26.81);Muscle weakness (generalized) (M62.81);Pain ?Pain - Right/Left: Left ?Pain - part of body: Arm;Shoulder ?  ? ? ?Time: 3785-8850 ?PT Time Calculation (min) (ACUTE ONLY): 26 min ? ?Charges:  $Gait Training: 8-22 mins ?$Therapeutic Exercise: 8-22 mins          ?          ?[name]. PTA ?Acute Rehabilitation Services ?Office: [phone] ? ? ?[name] ?02/02/2022, 3:50 PM ? ?

## 2022-02-02 NOTE — Progress Notes (Signed)
?PROGRESS NOTE ? ? ? ?Jeremiah [name]  WUX:[id number] DOB: 1951/07/20 DOA: 01/15/2022 ?PCP: Carol Ada, MD  ? ? ?Brief Narrative:  ?Patient is a 71 y.o. male with medical history significant of CAD s/p bypass in 1998, Hx of PE and DVT on Eliquis, HTN, alcohol abuse with hx of withdrawal, opioid dependency previous on Suboxone presented to the hospital with left shoulder and neck pain.  Reportedly patient lost his wife last year and moved out to [place] to live with his brother. However did not get along with his sister-in-law and has recently moved back here to [place] l a week prior to presentation and noted to be homeless and living in [place]. Noted to have multiple admissions recently in [place] for alcohol withdrawal.  On 10/2021 he was admitted following a fall and was noted to have a large left-sided chest wall hematoma  had multiple I&D and wound VAC in January due to persistent refractory hematoma, found to have chronically dislocated left shoulder arthroplasty in the setting of likely infection.  On 02/06/2021-CTA showed large burden of thromboembolic disease with central pulmonary artery distribution.  Saw Pulm in office with Dr Janett Billow at [facility]- who noted 'it sounds like his pulmonary embolism was associated with some lower extremity cellulitis which he was hospitalized and minimally mobile. He was treated with Eliquis, now off.  In the ED on this admission patient was hemodynamically stable.  Labs showed some mild anemia of hemoglobin 9.1.  EKG showed atrial flutter.  Troponin was borderline.  Left shoulder x-ray showed anterior dislocation of left reverse total shoulder arthroplasty.  Patient had CTA chest on 01/16/2022 which showed no evidence of pulmonary embolism but pulmonary hypertension and cardiomegaly.  There was note of 4.7 cm thoracic aortic aneurysm.  Radiology recommended dysemia [place] CT or MRI follow-up and referral to cardiothoracic surgery as outpatient.   Patient was also seen by orthopedics during hospitalization  and the  CT of left shoulder-showed postsurgical changes and dislocation, large periprosthetic fluid collection surrounding the proximal humerus and extending throughout the left axillary region up to 7.8 X4.4X 8.8-sequelae of postoperative seroma, hematoma versus particle disease.  Patient had 2D echocardiogram on 01/16/2022 which showed reduced LV function and cardiology was consulted.  On 01/17/2022 CT-guided biopsy of the left anterior axillary fluid was done with removal of 30 mils of cloudy fluid with.  Patient underwent removal of shoulder implant with excision of sinus tract antibiotic spacer placement and wound VAC placement by Dr. Marlou Sa on 01/18/2022.  On 01/19/2022 infectious disease was consulted who recommended prolonged IV antibiotic course. Patient has homelessness issues which has been a barrier for his disposition as well.  On 01/26/2022, patient was noted to be anemic and received 1 unit of packed RBC.  Otherwise he has remained stable except for ongoing pain issues.  ? ?  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 ?CT of the left shoulder showed fluid collection and underwent CT-guided biopsy on 01/17/2022 with removal of 30 mil of fluid status post removal of hardware, irrigation/debridement and placement of antibiotic spacer with 6-8 weeks of IV antibiotic.  Orthopedics and infectious disease followed the patient during hospitalization.  ? ?Acute on chronic combined systolic and diastolic CHF (congestive heart failure) ([place]) ?Acute on chronic systolic CHF with EF 35 to 40% ?Moderate to  severe aortic regurgitation on prior echo now mild ?Aortic root 4.27 cm ?Medical nonadherence ?Lower leg edema: ?Patient was  supposed to be on Bumex which he ran out for several months.  Continue CHF protocol.  Seen by cardiology during this hospitalization.  Net IO Since Admission: -24,480.89 mL [02/01/22 1058]  On oral lasix daily low dose.  Appears compensated at this time. ? ?Alcohol abuse ?No withdrawal symptoms at this time..  Continue thiamine, folic acid. ? ?Normocytic anemia ?Latest hemoglobin was 7.6 after 1 unit of packed RBC transfusion.   Continue vitamin B12. ? ?CAD (coronary artery disease) ?CAD s/p cabg x4 1998 ?Chronic trifascicular block ?Hyperlipidemia ?Continue aspirin and statins.  Hold AV nodal blockers.  ? ?Wrist pain ?Left wrist pain.  Seen by orthopedics.   Uric acid of 4.4.  X-ray of the left wrist shows severe osteoarthritis.  Orthopedic recommended  3-day course of colchicine which he has completed.    Has improved at this time. ? ?Hypokalemia ?Improved at this time.  Latest potassium of 4.3. ? ?Back pain ?Chronic back pain, spinal stenosis.   Patient does have high tolerance to pain.  On pain medication regimen oxycodone every 4 hourly.  Continue Lidoderm patch, gabapentin 3 times daily.  resumed tizanidine. ? ?Protein-calorie malnutrition, severe ?Severe protein calorie malnutrition.   ?Present on admission.  Nutrition on board.  Continue supplements.  Encourage oral nutrition. ? ?Thoracic aortic aneurysm (TAA) ?CTA incidentally showed a 4.7 cm thoracic aortic aneurysm, will need semiannual CT or MRI imaging/cardiothoracic surgery follow-up as outpatient ? ?Hypotension ? Not on nodal blockers.  Might need antihypertensive if continues to remain high.  ? ? ?B12 deficiency ?Continue vitamin B12 replacement. ? ?History of pulmonary embolism ?Completed treatment with Eliquis.  Not currently on anticoagulation due to risk of falls homelessness, history of hematoma, ongoing alcohol abuse. ? ?Depression ?Continue hydroxyzine, Wellbutrin, buspirone. ? ?Atrial flutter ([place]) ?New onset a flutter in the ED ?Asymptomatic  bradycardia during the night 2/26: ?TSH was within normal limit.  Patient did have pause for 2.90 seconds.  Cardiology was consulted.  Avoid nodal blockers.  Continue anticoagulation.  Rate controlled at this time. ? ? ? DVT prophylaxis: enoxaparin (LOVENOX) injection 40 mg Start: 01/19/22 1400 ?SCD's Start: 01/18/22 2238 ?Place TED hose Start: 01/18/22 2238 ?SCDs Start: 01/15/22 2347 ? ? ?Code Status:   ?  Code Status: Full Code ? ?Disposition: Patient is currently homeless, pending arrangement for antibiotic and housing ? ?Status is: Inpatient ? ?Remains inpatient appropriate because:   IV antibiotics, pending disposition plan ? ? Family Communication:  ?Spoke with the patient at bedside. ? ?Consultants:  ?Orthopedics ?Cardiology ? ?Procedures:  ?CT-guided aspiration on 01/17/2022 ?Total shoulder arthroplasty with antibiotic spacer placement on 01/18/2022 by Dr. Marlou Sa orthopedics  ? ?Antimicrobials:  ?Vancomycin IV ? ?Anti-infectives (From admission, onward)  ? ? Start     Dose/Rate Route Frequency Ordered Stop  ? 02/01/22 1000  vancomycin (VANCOREADY) IVPB 1250 mg/250 mL       ? 1,250 mg ?166.7 mL/hr over 90 Minutes Intravenous Every 24 hours 02/01/22 0851    ? 01/29/22 1000  vancomycin (VANCOCIN) IVPB 1000 mg/200 mL premix  Status:  Discontinued       ? 1,000 mg ?200 mL/hr over 60 Minutes Intravenous Every 24 hours 01/29/22 0904 02/01/22 0851  ? 01/28/22 1500  vancomycin (VANCOREADY) IVPB 1250 mg/250 mL  Status:  Discontinued       ? 1,250 mg ?166.7 mL/hr over 90 Minutes Intravenous Every 24 hours  01/28/22 1010 01/29/22 0904  ? 01/19/22 1030  vancomycin (VANCOREADY) IVPB 750 mg/150 mL  Status:  Discontinued       ? 750 mg ?150 mL/hr over 60 Minutes Intravenous Every 12 hours 01/19/22 1027 01/28/22 1004  ? 01/18/22 1925  vancomycin (VANCOCIN) powder  Status:  Discontinued       ?   As needed 01/18/22 1925 01/18/22 2103  ? 01/16/22 2200  vancomycin (VANCOREADY) IVPB 1250 mg/250 mL  Status:  Discontinued       ? 1,250  mg ?166.7 mL/hr over 90 Minutes Intravenous Every 24 hours 01/16/22 0107 01/19/22 1027  ? 01/16/22 0030  vancomycin (VANCOREADY) IVPB 1500 mg/300 mL       ? 1,500 mg ?150 mL/hr over 120 Minutes Intraven

## 2022-02-02 NOTE — Progress Notes (Signed)
Mobility Specialist Progress Note: ? ? 02/02/22 1100  ?Mobility  ?Activity Ambulated with assistance in hallway  ?Level of Assistance Modified independent, requires aide device or extra time  ?Assistive Device [place]  ?Distance Ambulated (ft) 570 ft  ?Activity Response Tolerated well  ?$Mobility charge 1 Mobility  ? ?Pt received in chair willing to participate in mobility. Complaints of L ankle pain. Left in chair with call bell in reach and all needs met.  ? ?Jeremiah [name] ?Mobility Specialist ?Primary Phone [phone] ? ?

## 2022-02-03 DIAGNOSIS — F1010 Alcohol abuse, uncomplicated: Secondary | ICD-10-CM | POA: Diagnosis not present

## 2022-02-03 DIAGNOSIS — I5043 Acute on chronic combined systolic (congestive) and diastolic (congestive) heart failure: Secondary | ICD-10-CM | POA: Diagnosis not present

## 2022-02-03 DIAGNOSIS — S41002A Unspecified open wound of left shoulder, initial encounter: Secondary | ICD-10-CM | POA: Diagnosis not present

## 2022-02-03 DIAGNOSIS — I483 Typical atrial flutter: Secondary | ICD-10-CM | POA: Diagnosis not present

## 2022-02-03 NOTE — Progress Notes (Signed)
PT Cancellation Note ? ?Patient Details ?Name: Jeremiah [name] ?MRN: [id number] ?DOB: June 19, 1951 ? ? ?Cancelled Treatment:    Reason Eval/Treat Not Completed: Patient declined, no reason specified;Other (comment) Pt on phone, declining mobility. Will check back as schedule allows to continue with PT POC. ? ? ? ?Betsey Holiday Maurie Musco ?02/03/2022, 11:27 AM ?

## 2022-02-03 NOTE — Progress Notes (Signed)
Occupational Therapy Treatment ?Patient Details ?Name: Jeremiah [name] ?MRN: [id number] ?DOB: 1951/06/16 ?Today's Date: 02/03/2022 ? ? ?History of present illness Pt is a 71 y/o M presenting to ED on 2/25 wtih L shoulder wound with pain radiating to chest. Found to have L shoulder anteromedial dislocation after fall 3 months ago. S/p removal of reverse shoulder implant with antibiotic spacer on 2/28. PMH includes CAD s/p bypass, hx of PE and DVT on eliquis, HTN and alcohol abuse. ?  ?OT comments ? Pt completed grooming with set up and UB bathing and dressing with moderate assistance seated at sink. Toilets with supervision for transfer, set up for pericare. Ambulated with cane with supervision in room/bathroom. Continues to be highly focused on financial situation.  ? ?Recommendations for follow up therapy are one component of a multi-disciplinary discharge planning process, led by the attending physician.  Recommendations may be updated based on patient status, additional functional criteria and insurance authorization. ?   ?Follow Up Recommendations ? Follow physician's recommendations for discharge plan and follow up therapies  ?  ?Assistance Recommended at Discharge Intermittent Supervision/Assistance  ?Patient can return home with the following ? A little help with walking and/or transfers;Assistance with cooking/housework;Direct supervision/assist for financial management;Assist for transportation;Help with stairs or ramp for entrance;A lot of help with bathing/dressing/bathroom ?  ?Equipment Recommendations ? None recommended by OT  ?  ?Recommendations for Other Services   ? ?  ?Precautions / Restrictions Precautions ?Precautions: Fall ?Precaution Comments: L shoulder precautions ?Required Braces or Orthoses: Sling ?Restrictions ?Weight Bearing Restrictions: Yes ?LUE Weight Bearing: Non weight bearing ?Other Position/Activity Restrictions: sling on at all times except for dressing/exercise, AROM of  hand/wrist/elbow ok, no shoulder AROM/PROM per MD  ? ? ?  ? ?Mobility Bed Mobility ?  ?  ?  ?  ?  ?  ?  ?General bed mobility comments: pt in chair on arrival ?  ? ?Transfers ?Overall transfer level: Needs assistance ?Equipment used: Straight cane ?Transfers: Sit to/from Stand ?Sit to Stand: Supervision ?  ?  ?  ?  ?  ?  ?  ?  ?Balance Overall balance assessment: Needs assistance ?  ?Sitting balance-Leahy Scale: Good ?  ?  ?  ?Standing balance-Leahy Scale: Fair ?  ?  ?  ?  ?  ?  ?  ?  ?  ?  ?  ?  ?   ? ?ADL either performed or assessed with clinical judgement  ? ?ADL Overall ADL's : Needs assistance/impaired ?  ?  ?Grooming: Set up;Wash/dry hands;Wash/dry face;Sitting (shaved at sink) ?  ?Upper Body Bathing: Moderate assistance;Sitting ?Upper Body Bathing Details (indicate cue type and reason): at sink ?  ?  ?  ?  ?  ?  ?Toilet Transfer: Supervision/safety;Ambulation;Regular Toilet ?  ?Toileting- Clothing Manipulation and Hygiene: Set up;Sitting/lateral lean ?  ?  ?  ?Functional mobility during ADLs: Supervision/safety;Cane ?  ?  ? ?Extremity/Trunk Assessment   ?  ?  ?  ?  ?  ? ?Vision   ?  ?  ?Perception   ?  ?Praxis   ?  ? ?Cognition Arousal/Alertness: Awake/alert ?Behavior During Therapy: Flat affect ?Overall Cognitive Status: Impaired/Different from baseline ?Area of Impairment: Problem solving ?  ?  ?  ?  ?  ?  ?  ?  ?  ?  ?  ?  ?  ?  ?Problem Solving: Decreased initiation, Requires verbal cues ?General Comments: continued perseveration on all problems ?  ?  ?   ?  Exercises Exercises: General Upper Extremity ?General Exercises - Upper Extremity ?Elbow Flexion: Left, 10 reps, Seated, AAROM ?Elbow Extension: AAROM, Left, 10 reps, Seated ?Wrist Flexion: 10 reps, Left, Seated, AROM ?Wrist Extension: AROM, Left, 10 reps, Seated ?Digit Composite Flexion: AROM, Left, 10 reps, Seated ?Composite Extension: AROM, Left, 10 reps, Seated ? ?  ?Shoulder Instructions   ? ? ?  ?General Comments    ? ? ?Pertinent Vitals/ Pain        Pain Assessment ?Pain Assessment: Faces ?Faces Pain Scale: Hurts little more ?Pain Location: L shoulder ?Pain Descriptors / Indicators: Aching ?Pain Intervention(s): Repositioned ? ?Home Living   ?  ?  ?  ?  ?  ?  ?  ?  ?  ?  ?  ?  ?  ?  ?  ?  ?  ?  ? ?  ?Prior Functioning/Environment    ?  ?  ?  ?   ? ?Frequency ? Min 3X/week  ? ? ? ? ?  ?Progress Toward Goals ? ?OT Goals(current goals can now be found in the care plan section) ? Progress towards OT goals: Progressing toward goals ? ?Acute Rehab OT Goals ?OT Goal Formulation: With patient ?Time For Goal Achievement: 02/15/22 ?Potential to Achieve Goals: Good  ?Plan Discharge plan remains appropriate   ? ?Co-evaluation ? ? ?   ?  ?  ?  ?  ? ?  ?AM-PAC OT "6 Clicks" Daily Activity     ?Outcome Measure ? ? Help from another person eating meals?: None ?Help from another person taking care of personal grooming?: A Little ?Help from another person toileting, which includes using toliet, bedpan, or urinal?: A Little ?Help from another person bathing (including washing, rinsing, drying)?: A Lot ?Help from another person to put on and taking off regular upper body clothing?: A Little ?Help from another person to put on and taking off regular lower body clothing?: A Lot ?6 Click Score: 17 ? ?  ?End of Session Equipment Utilized During Treatment: Other (comment) (cane and sling) ? ?OT Visit Diagnosis: Unsteadiness on feet (R26.81);Other abnormalities of gait and mobility (R26.89);Muscle weakness (generalized) (M62.81);Pain ?  ?Activity Tolerance Patient tolerated treatment well ?  ?Patient Left in chair;with call bell/phone within reach;with chair alarm set ?  ?Nurse Communication   ?  ? ?   ? ?Time: 8270-7867 ?OT Time Calculation (min): 49 min ? ?Charges: OT General Charges ?$OT Visit: 1 Visit ?OT Treatments ?$Self Care/Home Management : 23-37 mins ?$Therapeutic Exercise: 8-22 mins ? ?Nestor Lewandowsky, OTR/L ?Acute Rehabilitation Services ?Pager: [phone] ?Office:  [phone]  ? ?Jeremiah [name] ?02/03/2022, 1:03 PM ?

## 2022-02-03 NOTE — Progress Notes (Addendum)
Mobility Specialist Progress Note: ? ? 02/03/22 1222  ?Mobility  ?Activity Ambulated with assistance in hallway  ?Level of Assistance Standby assist, set-up cues, supervision of patient - no hands on  ?Assistive Device [place]  ?Distance Ambulated (ft) 570 ft  ?Activity Response Tolerated well  ?$Mobility charge 1 Mobility  ? ?Pt received in chair willing to participate in mobility. Complaints of ankle pain and pain in both his shoulders. Left in chair with call bell in reach and all needs met.  ? ?Jeremiah [name] ?Mobility Specialist ?Primary Phone [phone] ? ?

## 2022-02-03 NOTE — Progress Notes (Signed)
?PROGRESS NOTE ? ? ? ?Jeremiah [name]  HWE:[id number] DOB: 1951-05-06 DOA: 01/15/2022 ?PCP: Carol Ada, MD  ? ? ?Brief Narrative:  ? ?Patient is a 71 y.o. male with medical history significant of CAD s/p bypass in 1998, Hx of PE and DVT on Eliquis, HTN, alcohol abuse with hx of withdrawal, opioid dependency previous on Suboxone presented to the hospital with left shoulder and neck pain.  Reportedly patient lost his wife last year and moved out to [place] to live with his brother. However did not get along with his sister-in-law and has recently moved back here to [place] l a week prior to presentation and noted to be homeless and living in [place]. Noted to have multiple admissions recently in [place] for alcohol withdrawal.  On 10/2021 he was admitted following a fall and was noted to have a large left-sided chest wall hematoma  had multiple I&D and wound VAC in January due to persistent refractory hematoma, found to have chronically dislocated left shoulder arthroplasty in the setting of likely infection.  On 02/06/2021-CTA showed large burden of thromboembolic disease with central pulmonary artery distribution.  Saw Pulm in office with Dr Janett Billow at [facility]- who noted 'it sounds like his pulmonary embolism was associated with some lower extremity cellulitis which he was hospitalized and minimally mobile. He was treated with Eliquis, now off.  In the ED on this admission patient was hemodynamically stable.  Labs showed some mild anemia of hemoglobin 9.1.  EKG showed atrial flutter.  Troponin was borderline.  Left shoulder x-ray showed anterior dislocation of left reverse total shoulder arthroplasty.  Patient had CTA chest on 01/16/2022 which showed no evidence of pulmonary embolism but pulmonary hypertension and cardiomegaly.  There was note of 4.7 cm thoracic aortic aneurysm.  Radiology recommended dysemia [place] CT or MRI follow-up and referral to cardiothoracic surgery as outpatient.   Patient was also seen by orthopedics during hospitalization  and the  CT of left shoulder-showed postsurgical changes and dislocation, large periprosthetic fluid collection surrounding the proximal humerus and extending throughout the left axillary region up to 7.8 X4.4X 8.8-sequelae of postoperative seroma, hematoma versus particle disease.  Patient had 2D echocardiogram on 01/16/2022 which showed reduced LV function and cardiology was consulted.  On 01/17/2022 CT-guided biopsy of the left anterior axillary fluid was done with removal of 30 mils of cloudy fluid with.  Patient underwent removal of shoulder implant with excision of sinus tract antibiotic spacer placement and wound VAC placement by Dr. Marlou Sa on 01/18/2022.  On 01/19/2022 infectious disease was consulted who recommended prolonged IV antibiotic course. Patient has homelessness issues which has been a barrier for his disposition as well.  On 01/26/2022, patient was noted to be anemic and received 1 unit of packed RBC.  Otherwise he has remained stable except for ongoing pain issues.  ? ?  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 ?CT of the left shoulder showed fluid collection and underwent CT-guided biopsy on 01/17/2022 with removal of 30 mil of fluid status post removal of hardware, irrigation/debridement and placement of antibiotic spacer with 6-8 weeks of IV antibiotic.  Orthopedics and infectious disease followed the patient during hospitalization.  ? ?Acute on chronic combined systolic and diastolic CHF (congestive heart failure) ([place]) ?Acute on chronic systolic CHF with EF 35 to 40% ?Moderate  to severe aortic regurgitation on prior echo now mild ?Aortic root 4.27 cm ?Medical nonadherence ?Lower leg edema: ?Patient was  supposed to be on Bumex which he ran out for several months.  Continue CHF protocol.  Seen by cardiology during this hospitalization.  Net IO Since Admission: -09,811.93 mL [02/03/22 1326]  On oral lasix daily low dose.  Appears compensated at this time. ? ?Alcohol abuse ?No withdrawal symptoms at this time..  Continue thiamine, folic acid. ? ?Normocytic anemia ?Latest hemoglobin was 7.6 after 1 unit of packed RBC transfusion.   Continue vitamin B12.  Check CBC BMP in AM. ? ?CAD (coronary artery disease) ?CAD s/p cabg x4 1998 ?Chronic trifascicular block ?Hyperlipidemia ?Continue aspirin and statins.  Hold AV nodal blockers.  ? ?Wrist pain ?Left wrist pain.  Seen by orthopedics.   Uric acid of 4.4.  X-ray of the left wrist shows severe osteoarthritis.  Orthopedic recommended  3-day course of colchicine which he has completed.    His pain has improved at this time. ? ?Hypokalemia ?Improved at this time.  Latest potassium of 4.3. ? ?Back pain ?Chronic back pain, spinal stenosis.   Patient does have high tolerance to pain.  On pain medication regimen oxycodone every 4 hourly.  Continue Lidoderm patch, gabapentin 3 times daily and continue tizanidine. ? ?Protein-calorie malnutrition, severe ?Severe protein calorie malnutrition.   ?Present on admission.  Nutrition on board.  Continue supplements.  Encourage oral nutrition. ? ?Thoracic aortic aneurysm (TAA) ?CTA incidentally showed a 4.7 cm thoracic aortic aneurysm, will need semiannual CT or MRI imaging/cardiothoracic surgery follow-up as outpatient ? ?Hypotension ? Not on nodal blockers.  Might need antihypertensive if continues to remain high but relatively stable at this time. ? ? ?B12 deficiency ?Continue vitamin B12 replacement. ? ?History of pulmonary embolism ?Completed treatment with Eliquis.  Not currently on anticoagulation due to risk of falls homelessness, history of hematoma, ongoing alcohol abuse. ? ?Depression ?Continue hydroxyzine, Wellbutrin,  buspirone. ? ?Atrial flutter ([place]) ?New onset a flutter in the ED ?Asymptomatic bradycardia during the night 2/26: ?TSH was within normal limit.  Patient did have pause for 2.90 seconds.  Cardiology was consulted.  Avoid nodal blockers.  Continue anticoagulation.  Rate controlled at this time. ? ? ? DVT prophylaxis: enoxaparin (LOVENOX) injection 40 mg Start: 01/19/22 1400 ?SCD's Start: 01/18/22 2238 ?Place TED hose Start: 01/18/22 2238 ?SCDs Start: 01/15/22 2347 ? ? ?Code Status:   ?  Code Status: Full Code ? ?Disposition:  ?Patient is currently homeless, pending arrangement for IV antibiotic and housing ? ?Status is: Inpatient ? ?Remains inpatient appropriate because:   IV antibiotics, pending disposition plan ? ? Family Communication:  ?Spoke with the patient at bedside. ? ?Consultants:  ?Orthopedics ?Cardiology ? ?Procedures:  ?CT-guided aspiration on 01/17/2022 ?Total shoulder arthroplasty with antibiotic spacer placement on 01/18/2022 by Dr. Marlou Sa orthopedics  ? ?Antimicrobials:  ?Vancomycin IV ? ?Anti-infectives (From admission, onward)  ? ? Start     Dose/Rate Route Frequency Ordered Stop  ? 02/01/22 1000  vancomycin (VANCOREADY) IVPB 1250 mg/250 mL       ? 1,250 mg ?166.7 mL/hr over 90 Minutes Intravenous Every 24 hours 02/01/22 0851    ? 01/29/22 1000  vancomycin (VANCOCIN) IVPB 1000 mg/200 mL premix  Status:  Discontinued       ? 1,000 mg ?200 mL/hr over 60 Minutes Intravenous Every 24 hours 01/29/22 0904 02/01/22 0851  ? 01/28/22 1500  vancomycin (VANCOREADY) IVPB 1250 mg/250 mL  Status:  Discontinued       ?  1,250 mg ?166.7 mL/hr over 90 Minutes Intravenous Every 24 hours 01/28/22 1010 01/29/22 0904  ? 01/19/22 1030  vancomycin (VANCOREADY) IVPB 750 mg/150 mL  Status:  Discontinued       ? 750 mg ?150 mL/hr over 60 Minutes Intravenous Every 12 hours 01/19/22 1027 01/28/22 1004  ? 01/18/22 1925  vancomycin (VANCOCIN) powder  Status:  Discontinued       ?   As needed 01/18/22 1925 01/18/22 2103  ? 01/16/22  2200  vancomycin (VANCOREADY) IVPB 1250 mg/250 mL  Status:  Discontinued       ? 1,250 mg ?166.7 mL/hr over 90 Minutes Intravenous Every 24 hours 01/16/22 0107 01/19/22 1027  ? 01/16/22 0030  vancomycin (VANCOREADY

## 2022-02-04 DIAGNOSIS — S41002A Unspecified open wound of left shoulder, initial encounter: Secondary | ICD-10-CM | POA: Diagnosis not present

## 2022-02-04 LAB — VANCOMYCIN, PEAK: Vancomycin Pk: 29 ug/mL — ABNORMAL LOW (ref 30–40)

## 2022-02-04 LAB — VANCOMYCIN, TROUGH: Vancomycin Tr: 14 ug/mL — ABNORMAL LOW (ref 15–20)

## 2022-02-04 NOTE — Progress Notes (Signed)
Mobility Specialist Progress Note: ? ? 02/04/22 1143  ?Mobility  ?Activity Ambulated with assistance in hallway  ?Level of Assistance Standby assist, set-up cues, supervision of patient - no hands on  ?Assistive Device [place]  ?Distance Ambulated (ft) 570 ft  ?Activity Response Tolerated well  ?$Mobility charge 1 Mobility  ? ?Pt received In chair willing to participate in mobility. Complaints of low back pain and L shoulder pain. Left in chair with call bell in reach, all needs met, and chair alarm activated.  ? ?Adrina Armijo ?Mobility Specialist ?Primary Phone [phone] ? ?

## 2022-02-04 NOTE — Progress Notes (Signed)
Pharmacy Antibiotic Note ? ?Jeremiah [name] is a 71 y.o. male admitted on 01/15/2022 with  MRSA L shoulder joint infection .  Pharmacy has been consulted for Vancomycin dosing. ? ?Scr back down to 0.85 (~baseline) ? ?Plan: ?Continue Vancomycin 1250 MG IV Q24H ?Levels at Css ?BMET q72h while on vancomycin ?Consider repeat levels next week as at risk for further accumulation ? ?Height: '5\' 7"'$  (170.2 cm) ?Weight: 89.1 kg (196 lb 6.9 oz) ?IBW/kg (Calculated) : 66.1 ? ?Temp (24hrs), Avg:98.5 ?F (36.9 ?C), Min:98 ?F (36.7 ?C), Max:98.9 ?F (37.2 ?C) ? ?Recent Labs  ?Lab 01/29/22 ?0111 02/01/22 ?9811 02/04/22 ?0921 02/04/22 ?1505  ?WBC 5.6 6.3  --   --   ?CREATININE 1.24 0.85  --   --   ?[place]  --   --  14*  --   ?VANCOPEAK  --   --   --  29*  ?  ?Estimated Creatinine Clearance: 87.4 mL/min (by C-G formula based on SCr of 0.85 mg/dL).   ? ?Allergies  ?Allergen Reactions  ? Morphine Hives, Swelling and Rash  ?  Has taken with no reaction since original reaction  ? ? ?Antimicrobials this admission: ?Vanc 2/26 >> ?Doxy PTA 2/14 >> 2/25 ? ?Dose adjustments this admission: ?3/3 VP/VT 26/18, AUC 558 on '750mg'$  q12 ?3/9 VP/VT 31/20, AUC 618 >> change to '1250mg'$  q24 ?3/11 SCr bump >>change to '1000mg'$  q24 ?3/14 SCr downt o 0.85>>back to 1250 mg q24h ?3/17 VP/VT 29/14, cAUC 534 on 1250 mg q24h ? ?Microbiology: ?2/26 MRSA PCR - positive ?2/27 abscess - MRSA ?2/28 L joint fluid, swab A - negative ?2/28 L joint fluid, swab B - negative ?2/28 L joint fluid, swab C - negative ? ?Thank you for allowing pharmacy to be a part of this patient?s care. ? ?Sloan Leiter, PharmD, BCPS, BCCCP ?Clinical Pharmacist ?Please refer to [facility] for [place] numbers ?02/04/2022 9:46 PM ? ?

## 2022-02-04 NOTE — Progress Notes (Signed)
Occupational Therapy Treatment ?Patient Details ?Name: Jeremiah [name] ?MRN: [id number] ?DOB: September 16, 1951 ?Today's Date: 02/04/2022 ? ? ?History of present illness Pt is a 71 y/o M presenting to ED on 2/25 wtih L shoulder wound with pain radiating to chest. Found to have L shoulder anteromedial dislocation after fall 3 months ago. S/p removal of reverse shoulder implant with antibiotic spacer on 2/28. PMH includes CAD s/p bypass, hx of PE and DVT on eliquis, HTN and alcohol abuse. ?  ?OT comments ? Pt toileted with supervision and completed 2 grooming activities in sitting at sink. Pt with more initiation into problem solving for long term housing.   ? ?Recommendations for follow up therapy are one component of a multi-disciplinary discharge planning process, led by the attending physician.  Recommendations may be updated based on patient status, additional functional criteria and insurance authorization. ?   ?Follow Up Recommendations ? Follow physician's recommendations for discharge plan and follow up therapies  ?  ?Assistance Recommended at Discharge Intermittent Supervision/Assistance  ?Patient can return home with the following ? A little help with walking and/or transfers;Assistance with cooking/housework;Direct supervision/assist for financial management;Assist for transportation;Help with stairs or ramp for entrance;A lot of help with bathing/dressing/bathroom ?  ?Equipment Recommendations ? None recommended by OT  ?  ?Recommendations for Other Services   ? ?  ?Precautions / Restrictions Precautions ?Precautions: Fall ?Precaution Comments: L shoulder precautions ?Required Braces or Orthoses: Sling ?Restrictions ?Weight Bearing Restrictions: Yes ?LUE Weight Bearing: Non weight bearing ?Other Position/Activity Restrictions: sling on at all times except for dressing/exercise, AROM of hand/wrist/elbow ok, no shoulder AROM/PROM per MD  ? ? ?  ? ?Mobility Bed Mobility ?  ?  ?  ?  ?  ?  ?  ?General bed mobility  comments: pt in chair on arrival ?  ? ?Transfers ?Overall transfer level: Needs assistance ?Equipment used: Straight cane ?Transfers: Sit to/from Stand ?Sit to Stand: Supervision ?  ?  ?  ?  ?  ?  ?  ?  ?Balance Overall balance assessment: Needs assistance ?  ?Sitting balance-Leahy Scale: Good ?  ?  ?  ?Standing balance-Leahy Scale: Fair ?  ?  ?  ?  ?  ?  ?  ?  ?  ?  ?  ?  ?   ? ?ADL either performed or assessed with clinical judgement  ? ?ADL Overall ADL's : Needs assistance/impaired ?  ?  ?Grooming: Set up;Sitting;Wash/dry face (shaved) ?Grooming Details (indicate cue type and reason): seated on 3 in 1 ?  ?  ?  ?  ?  ?  ?  ?  ?Toilet Transfer: Supervision/safety;Ambulation;Regular Toilet ?  ?Toileting- Clothing Manipulation and Hygiene: Modified independent;Sitting/lateral lean ?  ?  ?  ?Functional mobility during ADLs: Supervision/safety;Cane ?  ?  ? ?Extremity/Trunk Assessment   ?  ?  ?  ?  ?  ? ?Vision   ?  ?  ?Perception   ?  ?Praxis   ?  ? ?Cognition Arousal/Alertness: Awake/alert ?Behavior During Therapy: Flat affect ?Overall Cognitive Status: Impaired/Different from baseline ?Area of Impairment: Problem solving ?  ?  ?  ?  ?  ?  ?  ?  ?  ?  ?  ?  ?  ?  ?Problem Solving: Decreased initiation, Requires verbal cues ?General Comments: pt in good spirits, found where his check has been being deposited using land line ?  ?  ?   ?Exercises   ? ?  ?Shoulder Instructions   ? ? ?  ?  General Comments    ? ? ?Pertinent Vitals/ Pain       Pain Assessment ?Pain Assessment: Faces ?Faces Pain Scale: No hurt ? ?Home Living   ?  ?  ?  ?  ?  ?  ?  ?  ?  ?  ?  ?  ?  ?  ?  ?  ?  ?  ? ?  ?Prior Functioning/Environment    ?  ?  ?  ?   ? ?Frequency ? Min 3X/week  ? ? ? ? ?  ?Progress Toward Goals ? ?OT Goals(current goals can now be found in the care plan section) ? Progress towards OT goals: Progressing toward goals ? ?Acute Rehab OT Goals ?OT Goal Formulation: With patient ?Time For Goal Achievement: 02/15/22 ?Potential to  Achieve Goals: Good  ?Plan Discharge plan remains appropriate   ? ?Co-evaluation ? ? ?   ?  ?  ?  ?  ? ?  ?AM-PAC OT "6 Clicks" Daily Activity     ?Outcome Measure ? ? Help from another person eating meals?: None ?Help from another person taking care of personal grooming?: A Little ?Help from another person toileting, which includes using toliet, bedpan, or urinal?: A Little ?Help from another person bathing (including washing, rinsing, drying)?: A Lot ?Help from another person to put on and taking off regular upper body clothing?: A Little ?Help from another person to put on and taking off regular lower body clothing?: A Lot ?6 Click Score: 17 ? ?  ?End of Session Equipment Utilized During Treatment: Gait belt;Other (comment) (cane) ? ?OT Visit Diagnosis: Unsteadiness on feet (R26.81);Other abnormalities of gait and mobility (R26.89);Muscle weakness (generalized) (M62.81);Pain ?  ?Activity Tolerance Patient tolerated treatment well ?  ?Patient Left in chair;with call bell/phone within reach;with chair alarm set ?  ?Nurse Communication   ?  ? ?   ? ?Time: 3614-4315 ?OT Time Calculation (min): 21 min ? ?Charges: OT General Charges ?$OT Visit: 1 Visit ?OT Treatments ?$Self Care/Home Management : 8-22 mins ? ?Nestor Lewandowsky, OTR/L ?Acute Rehabilitation Services ?Pager: [phone] ?Office: [phone]  ? ?Malka So ?02/04/2022, 1:16 PM ?

## 2022-02-04 NOTE — Progress Notes (Signed)
IVT consulted for 0900 DBIV - vanc lvl.  Pt not in room.  RN will be notified. ?

## 2022-02-04 NOTE — Progress Notes (Signed)
Physical Therapy Treatment ?Patient Details ?Name: Jeremiah [name] ?MRN: [id number] ?DOB: [birth year] ?Today's Date: 02/04/2022 ? ? ?History of Present Illness Pt is a 71 y/o M presenting to ED on 2/25 wtih L shoulder wound with pain radiating to chest. Found to have L shoulder anteromedial dislocation after fall 3 months ago. S/p removal of reverse shoulder implant with antibiotic spacer on 2/28. PMH includes CAD s/p bypass, hx of PE and DVT on eliquis, HTN and alcohol abuse. ? ?  ?PT Comments  ? ? Pt received OOB in chair and agreeable to session with focus on progression of safe functional mobility. Pt with c/o increased chronic ankle pain this session, ankle pumps, circles and seated heel raises for increased ROM, ice applied at end of session. Pt continues to benefit from skilled PT services to progress toward functional mobility goals.  ?  ?Recommendations for follow up therapy are one component of a multi-disciplinary discharge planning process, led by the attending physician.  Recommendations may be updated based on patient status, additional functional criteria and insurance authorization. ? ?Follow Up Recommendations ? Home health PT ?  ?  ?Assistance Recommended at Discharge Intermittent Supervision/Assistance  ?Patient can return home with the following A little help with walking and/or transfers;A little help with bathing/dressing/bathroom;Assist for transportation;Help with stairs or ramp for entrance ?  ?Equipment Recommendations ? Cane  ?  ?Recommendations for Other Services   ? ? ?  ?Precautions / Restrictions Precautions ?Precautions: Fall ?Precaution Comments: L shoulder precautions ?Required Braces or Orthoses: Sling ?Restrictions ?Weight Bearing Restrictions: Yes ?LUE Weight Bearing: Non weight bearing ?Other Position/Activity Restrictions: sling on at all times except for dressing/exercise, AROM of hand/wrist/elbow ok, no shoulder AROM/PROM per MD  ?  ? ?Mobility ? Bed Mobility ?  ?  ?  ?  ?  ?  ?   ?General bed mobility comments: pt in chair on arrival ?  ? ?Transfers ?Overall transfer level: Needs assistance ?Equipment used: Straight cane ?Transfers: Sit to/from Stand ?Sit to Stand: Supervision ?  ?  ?  ?  ?  ?  ?  ? ?Ambulation/Gait ?Ambulation/Gait assistance: Min guard, Supervision ?Gait Distance (Feet): 510 Feet (with seated rest break x1) ?Assistive device: Straight cane ?Gait Pattern/deviations: Step-through pattern, Decreased step length - right, Decreased step length - left, Wide base of support, Decreased stride length, Decreased stance time - left ?Gait velocity: decreased ?  ?  ?General Gait Details: generalized unsteadiness upon standing, specifially when turning, wide base of support, can correct but states it feels unsteady, much slower this session seconadry to L ankle pain ? ? ?Stairs ?  ?  ?  ?  ?  ? ? ?Wheelchair Mobility ?  ? ?Modified Rankin (Stroke Patients Only) ?  ? ? ?  ?Balance Overall balance assessment: Needs assistance ?  ?Sitting balance-Leahy Scale: Good ?  ?  ?  ?Standing balance-Leahy Scale: Fair ?  ?  ?  ?  ?  ?  ?  ?  ?  ?  ?  ?  ?  ? ?  ?Cognition Arousal/Alertness: Awake/alert ?Behavior During Therapy: Flat affect ?Overall Cognitive Status: Impaired/Different from baseline ?Area of Impairment: Problem solving ?  ?  ?  ?  ?  ?  ?  ?  ?  ?  ?  ?  ?  ?  ?Problem Solving: Decreased initiation, Requires verbal cues ?General Comments: continued perseveration on all problems ?  ?  ? ?  ?Exercises General Exercises - Lower Extremity ?  Ankle Circles/Pumps: AROM, Left, 10 reps, Seated ?Heel Raises: AROM, Left, 5 reps, Seated ? ?  ?General Comments   ?  ?  ? ?Pertinent Vitals/Pain Pain Assessment ?Pain Assessment: 0-10 ?Pain Score: 6  ?Pain Location: L ankle ?Pain Descriptors / Indicators: Aching, Sore ?Pain Intervention(s): Limited activity within patient's tolerance, Monitored during session, Repositioned, Ice applied  ? ? ?Home Living   ?  ?  ?  ?  ?  ?  ?  ?  ?  ?   ?  ?Prior  Function    ?  ?  ?   ? ?PT Goals (current goals can now be found in the care plan section) Acute Rehab PT Goals ?PT Goal Formulation: With patient ?Time For Goal Achievement: 02/02/22 ? ?  ?Frequency ? ? ? Min 5X/week ? ? ? ?  ?PT Plan    ? ? ?Co-evaluation   ?  ?  ?  ?  ? ?  ?AM-PAC PT "6 Clicks" Mobility   ?Outcome Measure ? Help needed turning from your back to your side while in a flat bed without using bedrails?: None ?Help needed moving from lying on your back to sitting on the side of a flat bed without using bedrails?: A Little ?Help needed moving to and from a bed to a chair (including a wheelchair)?: A Little ?Help needed standing up from a chair using your arms (e.g., wheelchair or bedside chair)?: A Little ?Help needed to walk in hospital room?: A Little ?Help needed climbing 3-5 steps with a railing? : A Little ?6 Click Score: 19 ? ?  ?End of Session Equipment Utilized During Treatment: Gait belt (sling) ?Activity Tolerance: Patient limited by pain ?Patient left: in chair;with call bell/phone within reach;with chair alarm set ?Nurse Communication: Mobility status ?PT Visit Diagnosis: Unsteadiness on feet (R26.81);Muscle weakness (generalized) (M62.81);Pain ?Pain - Right/Left: Left ?Pain - part of body: Arm;Shoulder ?  ? ? ?Time: 0932-6712 ?PT Time Calculation (min) (ACUTE ONLY): 20 min ? ?Charges:  $Therapeutic Exercise: 8-22 mins          ?          ?Jeremiah [name]. PTA ?Acute Rehabilitation Services ?Office: [phone] ? ? ? ?Jeremiah [name] ?02/04/2022, 9:12 AM ? ?

## 2022-02-04 NOTE — Progress Notes (Addendum)
?PROGRESS NOTE ? ? ? ?Je[place]iah [name]  QTM:[id number] DOB: [birth year] DOA: 01/15/2022 ?PCP: Carol Ada, MD ? ? ?Brief Narrative:  ?Patient is a 71 y.o. male with medical history significant of CAD s/p bypass in 1998, Hx of PE and DVT on Eliquis, HTN, alcohol abuse with hx of withdrawal, opioid dependency previous on Suboxone presented to the hospital with left shoulder and neck pain.  Reportedly patient lost his wife last year and moved out to [place] to live with his brother. However did not get along with his sister-in-law and has recently moved back here to [place] l a week prior to presentation and noted to be homeless and living in [place]. Noted to have multiple admissions recently in [place] for alcohol withdrawal.  On 10/2021 he was admitted following a fall and was noted to have a large left-sided chest wall hematoma  had multiple I&D and wound VAC in January due to persistent refractory hematoma, found to have chronically dislocated left shoulder arthroplasty in the setting of likely infection.  On 02/06/2021-CTA showed large burden of thromboembolic disease with central pulmonary artery distribution.  Saw Pulm in office with Dr Janett Billow at [facility]- who noted 'it sounds like his pulmonary embolism was associated with some lower extremity cellulitis which he was hospitalized and minimally mobile. He was treated with Eliquis, now off.  In the ED on this admission patient was hemodynamically stable.  Labs showed some mild anemia of hemoglobin 9.1.  EKG showed atrial flutter.  Troponin was borderline.  Left shoulder x-ray showed anterior dislocation of left reverse total shoulder arthroplasty.  Patient had CTA chest on 01/16/2022 which showed no evidence of pulmonary embolism but pulmonary hypertension and cardiomegaly.  There was note of 4.7 cm thoracic aortic aneurysm.  Radiology recommended dysemia [place] CT or MRI follow-up and referral to cardiothoracic surgery as outpatient.   Patient was also seen by orthopedics during hospitalization  and the  CT of left shoulder-showed postsurgical changes and dislocation, large periprosthetic fluid collection surrounding the proximal humerus and extending throughout the left axillary region up to 7.8 X4.4X 8.8-sequelae of postoperative seroma, hematoma versus particle disease.  Patient had 2D echocardiogram on 01/16/2022 which showed reduced LV function and cardiology was consulted.  On 01/17/2022 CT-guided biopsy of the left anterior axillary fluid was done with removal of 30 mils of cloudy fluid with.  Patient underwent removal of shoulder implant with excision of sinus tract antibiotic spacer placement and wound VAC placement by Dr. Marlou Sa on 01/18/2022.  On 01/19/2022 infectious disease was consulted who recommended prolonged IV antibiotic course. Patient has homelessness issues which has been a barrier for his disposition as well.  On 01/26/2022, patient was noted to be anemic and received 1 unit of packed RBC.  Otherwise he has remained stable except for ongoing pain issues.  ? ?  ? ?Assessment & Plan: ? 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 ?-CT of the left shoulder showed fluid collection and underwent CT-guided biopsy on 01/17/2022 with removal of 30 mil of fluid status post removal of hardware, irrigation/debridement and placement of antibiotic spacer with 6-8 weeks of IV antibiotic.   ?-Orthopedics and infectious disease followed the patient during hospitalization.  ?  ?Acute on chronic combined systolic and diastolic CHF (congestive heart failure) ([place]) ?Acute on chronic systolic CHF with EF 35 to 40% ?Moderate to severe  aortic regurgitation on prior echo now mild ?Aortic root 4.27 cm ?Medical nonadherence ?Lower leg edema: ?-Patient was supposed to  be on Bumex which he ran out for several months.  Continue CHF protocol.  Seen by cardiology during this hospitalization.  ?- On oral lasix daily low dose.  Appears compensated at this time. ?  ?Alcohol abuse ?-No withdrawal symptoms at this time..  Continue thiamine, folic acid. ?  ?Normocytic anemia ?-Latest hemoglobin was 7.6 after 1 unit of packed RBC transfusion.   Continue vitamin B12.   ?  ?CAD (coronary artery disease) ?CAD s/p cabg x4 1998 ?Chronic trifascicular block ?Hyperlipidemia ?-Continue aspirin and statins.  Hold AV nodal blockers.  ?  ?Wrist pain ?Left wrist pain.  Seen by orthopedics.   Uric acid of 4.4.  X-ray of the left wrist shows severe osteoarthritis.  Orthopedic recommended  3-day course of colchicine which he has completed. -His pain has improved at this time. ?  ?Hypokalemia ?-Improved at this time.  Latest potassium of 4.3. ?  ?Back pain ?Chronic back pain, spinal stenosis.    ?-Patient does have high tolerance to pain.  On pain medication regimen oxycodone every 4 hourly.  Continue Lidoderm patch, gabapentin 3 times daily and continue tizanidine. ?  ?Protein-calorie malnutrition, severe ?Severe protein calorie malnutrition.   ?-Present on admission.  Nutrition on board.  Continue supplements.  Encourage oral nutrition. ?  ?Thoracic aortic aneurysm (TAA) ?-CTA incidentally showed a 4.7 cm thoracic aortic aneurysm, will need semiannual CT or MRI imaging/cardiothoracic surgery follow-up as outpatient ?  ?B12 deficiency ?-Continue vitamin B12 replacement. ?  ?History of pulmonary embolism ?-Completed treatment with Eliquis.  Not currently on anticoagulation due to risk of falls homelessness, history of hematoma, ongoing alcohol abuse. ?  ?Depression/anxiety: ?-Continue hydroxyzine, Xanax, Wellbutrin, buspirone. ?  ?Atrial flutter ([place]) ?New onset a flutter in the ED ?Asymptomatic bradycardia during the night 2/26: ?-TSH was within normal limit.  Patient did have pause for 2.90 seconds.   Cardiology was consulted.  Avoid nodal blockers.  Continue anticoagulation.  Rate controlled at this time. ?  ? ?DVT prophylaxis: Lovenox ?Code Status: Full code ?Family Communication:  None present at bedside.  Plan of care discussed with patient in length and he verbalized understanding and agreed with it. ?Disposition Plan: To be determined ?Patient is homeless, it is challenging to find a place for him due to his ongoing medical issues requiring IV antibiotics.  TOC is aware and working on disposition.  He was given shelter information however patient has refused at this time. ? ?Consultants:  ?Orthopedic surgery ?Cardiology ? ?Procedures:  ?CT-guided aspiration on 01/17/2022 ?Total shoulder arthroplasty with antibiotic spacer placement on 01/18/2022 by Dr. Marlou Sa orthopedics  ? ?Antimicrobials:  ?Vancomycin ? ?Status is: Inpatient ? ? ? ? ? ?Subjective: ?Patient seen and examined.  Sitting comfortably on recliner.  No new complaints.  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  No fever, chills, nausea, vomiting.  No acute events overnight. ?Objective: ?Vitals:  ? 02/03/22 0929 02/03/22 1742 02/04/22 0356 02/04/22 4944  ?BP: 137/74 (!) 141/73 (!) 133/49 121/84  ?Pulse: 72 90 74 79  ?Resp: '18 19 18 18  '$ ?Temp: 98.2 ?F (36.8 ?C) 98.2 ?F (36.8 ?C)  98.9 ?F (37.2 ?C)  ?TempSrc: Oral Oral  Oral  ?SpO2: 100% 100% 98% 98%  ?Weight:      ?Height:      ? ? ?Intake/Output Summary (Last 24 hours) at 02/04/2022 1305 ?Last data  filed at 02/04/2022 1113 ?Gross per 24 hour  ?Intake 840 ml  ?Output 201 ml  ?Net 639 ml  ? ?Filed Weights  ? 02/02/22 0427 02/02/22 0729 02/03/22 0456  ?Weight: 88.7 kg 86.5 kg 89.1 kg  ? ? ?Examination: ? ?General exam: Appears calm and comfortable, communicating well, sitting comfortably on recliner ?Respiratory system: Clear to auscultation. Respiratory effort normal. ?Cardiovascular system: S1 & S2 heard, RRR. No JVD, murmurs, rubs, gallops or clicks. No pedal  edema. ?Gastrointestinal system: Abdomen is nondistended, soft and nontender. No organomegaly or masses felt. Normal bowel sounds heard. ?Central nervous system: Alert and oriented. No focal neurological deficits. ?Extremities

## 2022-02-05 DIAGNOSIS — S41002A Unspecified open wound of left shoulder, initial encounter: Secondary | ICD-10-CM | POA: Diagnosis not present

## 2022-02-05 NOTE — Progress Notes (Signed)
?  Subjective: ?Patient stable.  Pain reasonably well controlled in the left shoulder but he is having some occasional back pain and right shoulder pain as well.  ? ?Objective: ?Vital signs in last 24 hours: ?Temp:  [97.9 ?F (36.6 ?C)-98.9 ?F (37.2 ?C)] 97.9 ?F (36.6 ?C) (03/18 0300) ?Pulse Rate:  [71-90] 71 (03/18 0300) ?Resp:  [14-19] 14 (03/18 0300) ?BP: (112-153)/(59-84) 112/59 (03/18 0300) ?SpO2:  [97 %-99 %] 97 % (03/18 0300) ? ?Intake/Output from previous day: ?03/17 0701 - 03/18 0700 ?In: 960 [P.O.:960] ?Out: 1801 [Urine:1800; Stool:1] ?Intake/Output this shift: ?No intake/output data recorded. ? ?Exam: ? ?Incision dressed and intact ?Motor or sensory function to the hand intact on the left ? ?Labs: ?No results for input(s): HGB in the last 72 hours. ?No results for input(s): WBC, RBC, HCT, PLT in the last 72 hours. ?No results for input(s): NA, K, CL, CO2, BUN, CREATININE, GLUCOSE, CALCIUM in the last 72 hours. ?No results for input(s): LABPT, INR in the last 72 hours. ? ?Assessment/Plan: ?Plan is 6 weeks of IV antibiotics followed by oral antibiotics per ID recommendation.  At some point we will remove the antibiotic spacer but reimplantation is up in the air at this time based on his bone quality. ? ? ?Anderson Malta ?02/05/2022, 8:05 AM  ? ? ?  ?

## 2022-02-05 NOTE — Progress Notes (Signed)
?PROGRESS NOTE ? ? ? ?Jeremiah [name]  PRF:[id number] DOB: 21-Feb-1951 DOA: 01/15/2022 ?PCP: Jeremiah Ada, MD ? ? ?Brief Narrative:  ?Patient is a 71 y.o. male with medical history significant of CAD s/p bypass in 1998, Hx of PE and DVT on Eliquis, HTN, alcohol abuse with hx of withdrawal, opioid dependency previous on Suboxone presented to the hospital with left shoulder and neck pain.  Reportedly patient lost his wife last year and moved out to [place] to live with his brother. However did not get along with his sister-in-law and has recently moved back here to [name] a week prior to presentation and noted to be homeless and living in [name]. Noted to have multiple admissions recently in [place] for alcohol withdrawal.  On 10/2021 he was admitted following a fall and was noted to have a large left-sided chest wall hematoma  had multiple I&D and wound VAC in January due to persistent refractory hematoma, found to have chronically dislocated left shoulder arthroplasty in the setting of likely infection.  On 02/06/2021-CTA showed large burden of thromboembolic disease with central pulmonary artery distribution.  Saw Pulm in office with Jeremiah [name] at [facility]- who noted 'it sounds like his pulmonary embolism was associated with some lower extremity cellulitis which he was hospitalized and minimally mobile. He was treated with Eliquis, now off.  In the ED on this admission patient was hemodynamically stable.  Labs showed some mild anemia of hemoglobin 9.1.  EKG showed atrial flutter.  Troponin was borderline.  Left shoulder x-ray showed anterior dislocation of left reverse total shoulder arthroplasty.  Patient had CTA chest on 01/16/2022 which showed no evidence of pulmonary embolism but pulmonary hypertension and cardiomegaly.  There was note of 4.7 cm thoracic aortic aneurysm.  Radiology recommended dysemia [place] CT or MRI follow-up and referral to cardiothoracic surgery as outpatient.   Patient was also seen by orthopedics during hospitalization  and the  CT of left shoulder-showed postsurgical changes and dislocation, large periprosthetic fluid collection surrounding the proximal humerus and extending throughout the left axillary region up to 7.8 X4.4X 8.8-sequelae of postoperative seroma, hematoma versus particle disease.  Patient had 2D echocardiogram on 01/16/2022 which showed reduced LV function and cardiology was consulted.  On 01/17/2022 CT-guided biopsy of the left anterior axillary fluid was done with removal of 30 mils of cloudy fluid with.  Patient underwent removal of shoulder implant with excision of sinus tract antibiotic spacer placement and wound VAC placement by Jeremiah. Marlou [name] on 01/18/2022.  On 01/19/2022 infectious disease was consulted who recommended prolonged IV antibiotic course. Patient has homelessness issues which has been a barrier for his disposition as well.  On 01/26/2022, patient was noted to be anemic and received 1 unit of packed RBC.  Otherwise he has remained stable except for ongoing pain issues.  ? ?  ? ?Assessment & Plan: ? ?Left shoulder prosthetic joint infection ?-s/p CT-guided biopsy by IR on 01/17/2022 with removal of 30 mil of fluid status post removal of hardware, irrigation/debridement and placement of antibiotic spacer with 6-8 weeks of IV antibiotic.   ?-Orthopedics and infectious disease followed the patient during hospitalization.  ?-Total duration of antibiotics is 6 weeks from 2/28 to 4/11 ? ?Acute on chronic combined systolic and diastolic CHF (congestive heart failure) ([place]) ?-Patient was supposed to be on Bumex which he ran out for several months.  Continue CHF protocol.  Seen by cardiology during this hospitalization.  ?- On oral lasix daily low dose.  Appears compensated  at this time. ?  ?Alcohol abuse ?-No withdrawal symptoms at this time..  Continue thiamine, folic acid. ?  ?Normocytic anemia ?-Latest hemoglobin was 7.6 after 1 unit of packed RBC  transfusion.   Continue vitamin B12.   ?  ?CAD (coronary artery disease) ?CAD s/p cabg x4 1998 ?Chronic trifascicular block ?Hyperlipidemia ?-Continue aspirin and statins.  Hold AV nodal blockers.  ?  ?Wrist pain ?Left wrist pain.  Seen by orthopedics.   Uric acid of 4.4.  X-ray of the left wrist shows severe osteoarthritis.  Orthopedic recommended  3-day course of colchicine which he has completed. -His pain has improved at this time. ?  ?Hypokalemia ?-Improved at this time.  Latest potassium of 4.3. ?  ?Back pain ?Chronic back pain, spinal stenosis.    ?-Patient does have high tolerance to pain.  On pain medication regimen oxycodone every 4 hourly.  Continue Lidoderm patch, gabapentin 3 times daily and continue tizanidine. ?  ?Protein-calorie malnutrition, severe ?Severe protein calorie malnutrition.   ?-Present on admission.  Nutrition on board.  Continue supplements.  Encourage oral nutrition. ?  ?Thoracic aortic aneurysm (TAA) ?-CTA incidentally showed a 4.7 cm thoracic aortic aneurysm, will need semiannual CT or MRI imaging/cardiothoracic surgery follow-up as outpatient ?  ?B12 deficiency ?-Continue vitamin B12 replacement. ?  ?History of pulmonary embolism ?-Completed treatment with Eliquis.  Not currently on anticoagulation due to risk of falls homelessness, history of hematoma, ongoing alcohol abuse. ?  ?Depression/anxiety: ?-Continue hydroxyzine, Xanax, Wellbutrin, buspirone. ?  ?Atrial flutter ([place]) ?-TSH was within normal limit.  ?- Patient did have pause for 2.90 seconds.  Cardiology was consulted.  Avoid nodal blockers.  Rate controlled at this time. ?  ? ?DVT prophylaxis: Lovenox ?Code Status: Full code ?Family Communication:  None present at bedside.  Plan of care discussed with patient in length and he verbalized understanding and agreed with it. ?Disposition Plan: To be determined ? ?Patient is homeless, it is challenging to find a place for him due to his ongoing medical issues requiring prolonged  antibiotics.  TOC is aware and working on disposition.  He was given shelter information however patient has refused at this time. ? ?Consultants:  ?Orthopedic surgery ?Cardiology ? ?Procedures:  ?CT-guided aspiration on 01/17/2022 ?Total shoulder arthroplasty with antibiotic spacer placement on 01/18/2022 by Jeremiah. Marlou [name] orthopedics  ? ?Antimicrobials:  ?Vancomycin ? ?Status is: Inpatient ? ? ? ? ? ?Subjective: ?Patient seen and examined.  Sitting comfortably on recliner.  No Jeremiah complaints.  No acute events overnight.  Remained afebrile. ?Objective: ?Vitals:  ? 02/04/22 1900 02/05/22 0300 02/05/22 0820 02/05/22 0900  ?BP: (!) 147/61 (!) 112/59 (!) 155/69   ?Pulse: 90 71 72   ?Resp: '16 14 16   '$ ?Temp: 98.7 ?F (37.1 ?C) 97.9 ?F (36.6 ?C) 98.1 ?F (36.7 ?C)   ?TempSrc: Oral Oral Oral   ?SpO2: 99% 97% 100%   ?Weight:    88 kg  ?Height:      ? ? ?Intake/Output Summary (Last 24 hours) at 02/05/2022 1207 ?Last data filed at 02/05/2022 0800 ?Gross per 24 hour  ?Intake 720 ml  ?Output 3400 ml  ?Net -2680 ml  ? ? ?Filed Weights  ? 02/02/22 0729 02/03/22 0456 02/05/22 0900  ?Weight: 86.5 kg 89.1 kg 88 kg  ? ? ?Examination: ? ?General exam: Appears calm and comfortable, communicating well, sitting comfortably on recliner ?Respiratory system: Clear to auscultation. Respiratory effort normal. ?Cardiovascular system: S1 & S2 heard, RRR. No JVD, murmurs, rubs, gallops or clicks.  No pedal edema. ?Gastrointestinal system: Abdomen is nondistended, soft and nontender. No organomegaly or masses felt. Normal bowel sounds heard. ?Central nervous system: Alert and oriented. No focal neurological deficits. ?Extremities: Left shoulder in sling.   ?Skin: No rashes, lesions or ulcers ?Psychiatry: Judgement and insight appear normal. Mood & affect appropriate.  ? ? ?Data Reviewed: I have personally reviewed following labs and imaging studies ? ?CBC: ?Recent Labs  ?Lab 02/01/22 ?9872  ?WBC 6.3  ?HGB 7.6*  ?HCT 24.9*  ?MCV 85.0  ?PLT 373  ? ? ?Basic  Metabolic Panel: ?Recent Labs  ?Lab 02/01/22 ?1587  ?NA 138  ?K 4.3  ?CL 107  ?CO2 27  ?GLUCOSE 90  ?BUN 40*  ?CREATININE 0.85  ?CALCIUM 8.4*  ?MG 1.9  ? ? ?GFR: ?Estimated Creatinine Clearance: 86.9 mL/min (by C-G f

## 2022-02-05 NOTE — Progress Notes (Signed)
Mobility Specialist Progress Note  ? ? 02/05/22 1620  ?Mobility  ?Activity Ambulated independently in hallway  ?Level of Assistance Standby assist, set-up cues, supervision of patient - no hands on  ?Assistive Device [place]  ?Distance Ambulated (ft) 570 ft  ?Activity Response Tolerated fair  ?$Mobility charge 1 Mobility  ? ?Pt received in bed and agreeable. Pt expressed concern about being out on the streets after stay and needing a photo ID. Took x1 seated rest break during walk. Returned to chair with call bell in reach.  ? ?Hildred Alamin ?Mobility Specialist  ?  ?

## 2022-02-06 DIAGNOSIS — S41002A Unspecified open wound of left shoulder, initial encounter: Secondary | ICD-10-CM | POA: Diagnosis not present

## 2022-02-06 LAB — CBC
HCT: 29.6 % — ABNORMAL LOW (ref 39.0–52.0)
Hemoglobin: 9 g/dL — ABNORMAL LOW (ref 13.0–17.0)
MCH: 25.8 pg — ABNORMAL LOW (ref 26.0–34.0)
MCHC: 30.4 g/dL (ref 30.0–36.0)
MCV: 84.8 fL (ref 80.0–100.0)
Platelets: 402 10*3/uL — ABNORMAL HIGH (ref 150–400)
RBC: 3.49 MIL/uL — ABNORMAL LOW (ref 4.22–5.81)
RDW: 17.2 % — ABNORMAL HIGH (ref 11.5–15.5)
WBC: 5.7 10*3/uL (ref 4.0–10.5)
nRBC: 0 % (ref 0.0–0.2)

## 2022-02-06 LAB — BASIC METABOLIC PANEL
Anion gap: 8 (ref 5–15)
BUN: 41 mg/dL — ABNORMAL HIGH (ref 8–23)
CO2: 26 mmol/L (ref 22–32)
Calcium: 8.7 mg/dL — ABNORMAL LOW (ref 8.9–10.3)
Chloride: 104 mmol/L (ref 98–111)
Creatinine, Ser: 0.82 mg/dL (ref 0.61–1.24)
GFR, Estimated: >60 mL/min (ref >60)
Glucose, Bld: 194 mg/dL — ABNORMAL HIGH (ref 70–99)
Potassium: 3.9 mmol/L (ref 3.5–5.1)
Sodium: 138 mmol/L (ref 135–145)

## 2022-02-06 NOTE — Progress Notes (Addendum)
Mobility Specialist Progress Note  ? ? 02/06/22 1215  ?Mobility  ?Activity Ambulated with assistance in hallway  ?Level of Assistance Contact guard assist, steadying assist  ?Assistive Device Cane  ?LUE Weight Bearing NWB  ?Distance Ambulated (ft) 570 ft  ?Activity Response Tolerated fair  ?$Mobility charge 1 Mobility  ? ?Pt received in chair and agreeable. Had void in BR. Took x1 seated rest break. Returned to chair with alarm on. C/o shoulder pain but said he took it out of his sling to exercise it. Reminded pt it is not time to move his shoulder yet. ? ?Throughout session, pt c/o needing a photo ID and help finding housing. He also stated he wants stronger/more medication to help deal with his physical and emotional pain and stress. Pt stated he feels like he can't do this anymore. Pt says no one is helping him find housing and that the print size was too small on the shelter handouts for him to read. Although, pt also stated he is not going to "sit here and look for housing". RN notified.  ?Before leaving, pt pushed his lunch tray away saying he has gained weight and has to starve himself to get it off.  ?Hildred Alamin ?Mobility Specialist  ?  ?

## 2022-02-06 NOTE — Progress Notes (Signed)
Pt removed arm sling. This RN advised pt to keep sling at all times. Pt refuses to put sling back on. ?

## 2022-02-06 NOTE — Progress Notes (Signed)
?PROGRESS NOTE ? ? ? ?Jeremiah [name]  YWV:[id number] DOB: May 10, 1951 DOA: 01/15/2022 ?PCP: Carol Ada, MD ? ? ?Brief Narrative:  ?Patient is a 71 y.o. male with medical history significant of CAD s/p bypass in 1998, Hx of PE and DVT on Eliquis, HTN, alcohol abuse with hx of withdrawal, opioid dependency previous on Suboxone presented to the hospital with left shoulder and neck pain.  Reportedly patient lost his wife last year and moved out to [place] to live with his brother. However did not get along with his sister-in-law and has recently moved back here to [place] l a week prior to presentation and noted to be homeless and living in [place]. Noted to have multiple admissions recently in [place] for alcohol withdrawal.  On 10/2021 he was admitted following a fall and was noted to have a large left-sided chest wall hematoma  had multiple I&D and wound VAC in January due to persistent refractory hematoma, found to have chronically dislocated left shoulder arthroplasty in the setting of likely infection.  On 02/06/2021-CTA showed large burden of thromboembolic disease with central pulmonary artery distribution.  Saw Pulm in office with Dr Janett Billow at [facility]- who noted 'it sounds like his pulmonary embolism was associated with some lower extremity cellulitis which he was hospitalized and minimally mobile. He was treated with Eliquis, now off.  In the ED on this admission patient was hemodynamically stable.  Labs showed some mild anemia of hemoglobin 9.1.  EKG showed atrial flutter.  Troponin was borderline.  Left shoulder x-ray showed anterior dislocation of left reverse total shoulder arthroplasty.  Patient had CTA chest on 01/16/2022 which showed no evidence of pulmonary embolism but pulmonary hypertension and cardiomegaly.  There was note of 4.7 cm thoracic aortic aneurysm.  Radiology recommended dysemia [place] CT or MRI follow-up and referral to cardiothoracic surgery as outpatient.   Patient was also seen by orthopedics during hospitalization  and the  CT of left shoulder-showed postsurgical changes and dislocation, large periprosthetic fluid collection surrounding the proximal humerus and extending throughout the left axillary region up to 7.8 X4.4X 8.8-sequelae of postoperative seroma, hematoma versus particle disease.  Patient had 2D echocardiogram on 01/16/2022 which showed reduced LV function and cardiology was consulted.  On 01/17/2022 CT-guided biopsy of the left anterior axillary fluid was done with removal of 30 mils of cloudy fluid with.  Patient underwent removal of shoulder implant with excision of sinus tract antibiotic spacer placement and wound VAC placement by Dr. Marlou Sa on 01/18/2022.  On 01/19/2022 infectious disease was consulted who recommended prolonged IV antibiotic course. Patient has homelessness issues which has been a barrier for his disposition as well.  On 01/26/2022, patient was noted to be anemic and received 1 unit of packed RBC.  Otherwise he has remained stable except for ongoing pain issues.  ? ?  ? ?Assessment & Plan: ? ?Left shoulder prosthetic joint infection ?-s/p CT-guided biopsy by IR on 01/17/2022 with removal of 30 mil of fluid status post removal of hardware, irrigation/debridement and placement of antibiotic spacer with 6-8 weeks of IV antibiotic.   ?-Orthopedics and infectious disease followed the patient during hospitalization.  ?-Total duration of antibiotics is 6 weeks from 2/28 to 4/11 ? ?Acute on chronic combined systolic and diastolic CHF (congestive heart failure) ([place]) ?-Patient was supposed to be on Bumex which he ran out for several months.  Continue CHF protocol.  Seen by cardiology during this hospitalization.  ?- On oral lasix daily low dose.  Appears compensated  at this time. ?  ?Alcohol abuse ?-No withdrawal symptoms at this time..  Continue thiamine, folic acid. ?  ?Normocytic anemia ?-Hemoglobin 6.8 on 01/26/2022 status post 1 unit PRBC  transfusion. ?-Current hemoglobin 9.0. ?-  Continue vitamin B12.   ?  ?CAD (coronary artery disease) ?CAD s/p cabg x4 1998 ?Chronic trifascicular block ?Hyperlipidemia ?-Continue aspirin and statins.  Hold AV nodal blockers.  ?  ?Wrist pain ?Left wrist pain.  Seen by orthopedics.   Uric acid of 4.4.  X-ray of the left wrist shows severe osteoarthritis.  Orthopedic recommended  3-day course of colchicine which he has completed. -His pain has improved at this time. ?  ?Hypokalemia ?-Resolved ?  ?Back pain ?Chronic back pain, spinal stenosis.    ?-Patient does have high tolerance to pain.  On pain medication regimen oxycodone every 4 hourly.  Continue Lidoderm patch, gabapentin 3 times daily and continue tizanidine. ?  ?Protein-calorie malnutrition, severe ?Severe protein calorie malnutrition.   ?-Present on admission.  Nutrition on board.  Continue supplements.  Encourage oral nutrition. ?  ?Thoracic aortic aneurysm (TAA) ?-CTA incidentally showed a 4.7 cm thoracic aortic aneurysm, will need semiannual CT or MRI imaging/cardiothoracic surgery follow-up as outpatient ?  ?B12 deficiency ?-Continue vitamin B12 replacement. ?  ?History of pulmonary embolism ?-Completed treatment with Eliquis.  Not currently on anticoagulation due to risk of falls homelessness, history of hematoma, ongoing alcohol abuse. ?  ?Depression/anxiety: ?-Continue hydroxyzine, Xanax, Wellbutrin, buspirone. ?  ?Atrial flutter ([place]) ?-TSH was within normal limit.  ?- Patient did have pause for 2.90 seconds.  Cardiology was consulted.  Avoid nodal blockers.  Rate controlled at this time. ?  ? ?DVT prophylaxis: Lovenox ?Code Status: Full code ?Family Communication:  None present at bedside.  Plan of care discussed with patient in length and he verbalized understanding and agreed with it. ?Disposition Plan: To be determined ? ?Patient is homeless, it is challenging to find a place for him due to his ongoing medical issues requiring prolonged  antibiotics.  TOC is aware and working on disposition.  He was given shelter information however patient has refused at this time. ? ?Consultants:  ?Orthopedic surgery ?Cardiology ? ?Procedures:  ?CT-guided aspiration on 01/17/2022 ?Total shoulder arthroplasty with antibiotic spacer placement on 01/18/2022 by Dr. Marlou Sa orthopedics  ? ?Antimicrobials:  ?Vancomycin ? ?Status is: Inpatient ? ? ? ? ? ?Subjective: ?Patient seen and examined.  Sitting comfortably on the bed.  Upset that eggs are not available this morning for the breakfast.  No fever, chills.  He has been refusing to put on his shoulder sling as he wanted to move his shoulder.  No acute events overnight. ?Objective: ?Vitals:  ? 02/05/22 2043 02/05/22 2113 02/06/22 0354 02/06/22 0805  ?BP: (!) 91/43 (!) 100/48 (!) 154/85 (!) 143/58  ?Pulse: 74 74 75 78  ?Resp: '16 16 18 18  '$ ?Temp: 98.1 ?F (36.7 ?C) 98.2 ?F (36.8 ?C) 98 ?F (36.7 ?C) 98.4 ?F (36.9 ?C)  ?TempSrc: Oral Oral Oral   ?SpO2: 96% 97% 98% 100%  ?Weight:      ?Height:      ? ? ?Intake/Output Summary (Last 24 hours) at 02/06/2022 1352 ?Last data filed at 02/06/2022 0426 ?Gross per 24 hour  ?Intake --  ?Output 2600 ml  ?Net -2600 ml  ? ? ?Filed Weights  ? 02/02/22 0729 02/03/22 0456 02/05/22 0900  ?Weight: 86.5 kg 89.1 kg 88 kg  ? ? ?Examination: ? ?General exam: Appears calm and comfortable, communicating well, sitting comfortably  on the bed ?Respiratory system: Clear to auscultation. Respiratory effort normal. ?Cardiovascular system: S1 & S2 heard, RRR. No JVD, murmurs, rubs, gallops or clicks. No pedal edema. ?Gastrointestinal system: Abdomen is nondistended, soft and nontender. No organomegaly or masses felt. Normal bowel sounds heard. ?Central nervous system: Alert and oriented. No focal neurological deficits. ?Skin: No rashes, lesions or ulcers ?Psychiatry: Judgement and insight appear normal. Mood & affect appropriate.  ? ? ?Data Reviewed: I have personally reviewed following labs and imaging  studies ? ?CBC: ?Recent Labs  ?Lab 02/01/22 ?4585 02/06/22 ?0409  ?WBC 6.3 5.7  ?HGB 7.6* 9.0*  ?HCT 24.9* 29.6*  ?MCV 85.0 84.8  ?PLT 373 402*  ? ? ?Basic Metabolic Panel: ?Recent Labs  ?Lab 02/01/22 ?9292 02/06/22 ?0409  ?NA 138 138

## 2022-02-07 DIAGNOSIS — I5043 Acute on chronic combined systolic (congestive) and diastolic (congestive) heart failure: Secondary | ICD-10-CM | POA: Diagnosis not present

## 2022-02-07 DIAGNOSIS — S41002A Unspecified open wound of left shoulder, initial encounter: Secondary | ICD-10-CM | POA: Diagnosis not present

## 2022-02-07 DIAGNOSIS — F1010 Alcohol abuse, uncomplicated: Secondary | ICD-10-CM | POA: Diagnosis not present

## 2022-02-07 DIAGNOSIS — E538 Deficiency of other specified B group vitamins: Secondary | ICD-10-CM | POA: Diagnosis not present

## 2022-02-07 LAB — VANCOMYCIN, TROUGH: Vancomycin Tr: 13 ug/mL — ABNORMAL LOW (ref 15–20)

## 2022-02-07 NOTE — Progress Notes (Signed)
Physical Therapy Treatment ?Patient Details ?Name: Jeremiah [name] ?MRN: [id number] ?DOB: October 04, 1951 ?Today's Date: 02/07/2022 ? ? ?History of Present Illness Pt is a 71 y/o M presenting to ED on 2/25 wtih L shoulder wound with pain radiating to chest. Found to have L shoulder anteromedial dislocation after fall 3 months ago. S/p removal of reverse shoulder implant with antibiotic spacer on 2/28. PMH includes CAD s/p bypass, hx of PE and DVT on eliquis, HTN and alcohol abuse. ? ?  ?PT Comments  ? ? Pt with continued progress towards goals this session despite c/o increased ankle and toe pain on L. Pt with good tolerance of L ankle and toe P/AROM for increased ROM, stretching, and pain relief. Pt instructed on stretches/therex with Tband for L ankle and toes with pt able to demo back and verbalize understanding. Pt continues to benefit from skilled PT services to progress toward functional mobility goals.  ?  ?Recommendations for follow up therapy are one component of a multi-disciplinary discharge planning process, led by the attending physician.  Recommendations may be updated based on patient status, additional functional criteria and insurance authorization. ? ?Follow Up Recommendations ? Home health PT ?  ?  ?Assistance Recommended at Discharge Intermittent Supervision/Assistance  ?Patient can return home with the following A little help with walking and/or transfers;A little help with bathing/dressing/bathroom;Assist for transportation;Help with stairs or ramp for entrance ?  ?Equipment Recommendations ? Cane  ?  ?Recommendations for Other Services   ? ? ?  ?Precautions / Restrictions Precautions ?Precautions: Fall ?Precaution Comments: L shoulder precautions ?Required Braces or Orthoses: Sling ?Restrictions ?Weight Bearing Restrictions: Yes ?LUE Weight Bearing: Non weight bearing ?Other Position/Activity Restrictions: sling on at all times except for dressing/exercise, AROM of hand/wrist/elbow ok, no shoulder  AROM/PROM per MD  ?  ? ?Mobility ? Bed Mobility ?Overal bed mobility: Modified Independent ?Bed Mobility: Supine to Sit ?  ?  ?Supine to sit: Modified independent (Device/Increase time) ?  ?  ?  ?  ? ?Transfers ?Overall transfer level: Needs assistance ?Equipment used: Straight cane ?Transfers: Sit to/from Stand ?Sit to Stand: Supervision ?  ?  ?  ?  ?  ?  ?  ? ?Ambulation/Gait ?Ambulation/Gait assistance: Min guard, Supervision ?Gait Distance (Feet): 240 Feet (seated recovery x1) ?Assistive device: Straight cane ?Gait Pattern/deviations: Step-through pattern, Decreased step length - right, Decreased step length - left, Wide base of support, Decreased stride length, Decreased stance time - left ?Gait velocity: decreased ?  ?  ?General Gait Details: generalized unsteadiness upon standing, specifially when turning, wide base of support, can correct but states it feels unsteady, much slower this session seconadry to L ankle pain ? ? ?Stairs ?  ?  ?  ?  ?  ? ? ?Wheelchair Mobility ?  ? ?Modified Rankin (Stroke Patients Only) ?  ? ? ?  ?Balance Overall balance assessment: Needs assistance ?  ?Sitting balance-Leahy Scale: Good ?  ?  ?  ?Standing balance-Leahy Scale: Fair ?  ?  ?  ?  ?  ?  ?  ?  ?  ?  ?  ?  ?  ? ?  ?Cognition Arousal/Alertness: Awake/alert ?Behavior During Therapy: Flat affect ?Overall Cognitive Status: Impaired/Different from baseline ?Area of Impairment: Problem solving ?  ?  ?  ?  ?  ?  ?  ?  ?  ?  ?  ?  ?  ?  ?Problem Solving: Decreased initiation, Requires verbal cues ?  ?  ?  ? ?  ?  Exercises Other Exercises ?Other Exercises: AROM all planes of L ankle ?Other Exercises: self stretch of L toes with hand and blue Tband ? ?  ?General Comments   ?  ?  ? ?Pertinent Vitals/Pain Pain Assessment ?Pain Assessment: Faces ?Faces Pain Scale: Hurts even more ?Pain Location: L ankle and toes ?Pain Descriptors / Indicators: Aching, Sore ?Pain Intervention(s): Limited activity within patient's tolerance, Monitored  during session, Repositioned  ? ? ?Home Living   ?  ?  ?  ?  ?  ?  ?  ?  ?  ?   ?  ?Prior Function    ?  ?  ?   ? ?PT Goals (current goals can now be found in the care plan section) Acute Rehab PT Goals ?PT Goal Formulation: With patient ?Time For Goal Achievement: 02/02/22 ? ?  ?Frequency ? ? ? Min 5X/week ? ? ? ?  ?PT Plan    ? ? ?Co-evaluation   ?  ?  ?  ?  ? ?  ?AM-PAC PT "6 Clicks" Mobility   ?Outcome Measure ? Help needed turning from your back to your side while in a flat bed without using bedrails?: None ?Help needed moving from lying on your back to sitting on the side of a flat bed without using bedrails?: A Little ?Help needed moving to and from a bed to a chair (including a wheelchair)?: A Little ?Help needed standing up from a chair using your arms (e.g., wheelchair or bedside chair)?: A Little ?Help needed to walk in hospital room?: A Little ?Help needed climbing 3-5 steps with a railing? : A Little ?6 Click Score: 19 ? ?  ?End of Session Equipment Utilized During Treatment: Gait belt ?Activity Tolerance: Patient limited by pain ?Patient left: in chair;with call bell/phone within reach;with chair alarm set ?Nurse Communication: Mobility status ?PT Visit Diagnosis: Unsteadiness on feet (R26.81);Muscle weakness (generalized) (M62.81);Pain ?Pain - Right/Left: Left ?Pain - part of body: Arm;Shoulder ?  ? ? ?Time: 9629-5284 ?PT Time Calculation (min) (ACUTE ONLY): 27 min ? ?Charges:  $Therapeutic Exercise: 8-22 mins ?$Therapeutic Activity: 8-22 mins          ?          ? ?[name]. PTA ?Acute Rehabilitation Services ?Office: [phone] ? ? ? ?Betsey Holiday Krishiv Sandler ?02/07/2022, 10:05 AM ? ?

## 2022-02-07 NOTE — Progress Notes (Signed)
?PROGRESS NOTE ? ? ? ?Jeremiah [name]  ZHY:[id number] DOB: [birth year] DOA: 01/15/2022 ?PCP: Carol Ada, MD ? ? ?Brief Narrative:  ?71 y.o. male with medical history significant of CAD s/p bypass in 1998, Hx of PE and DVT on Eliquis, HTN, alcohol abuse with hx of withdrawal, opioid dependency previous on Suboxone presented to the hospital with left shoulder and neck pain.  Reportedly patient lost his wife last year and moved out to [place] to live with his brother. However did not get along with his sister-in-law and has recently moved back here to [place] l a week prior to presentation and noted to be homeless and living in [place]. Noted to have multiple admissions recently in [place] for alcohol withdrawal.  On 10/2021 he was admitted following a fall and was noted to have a large left-sided chest wall hematoma  had multiple I&D and wound VAC in January due to persistent refractory hematoma, found to have chronically dislocated left shoulder arthroplasty in the setting of likely infection.  On 02/06/2021-CTA showed large burden of thromboembolic disease with central pulmonary artery distribution.  Saw Pulm in office with Dr Janett Billow at [facility]- who noted 'it sounds like his pulmonary embolism was associated with some lower extremity cellulitis which he was hospitalized and minimally mobile. He was treated with Eliquis, now off.  In the ED on this admission patient was hemodynamically stable.  Labs showed some mild anemia of hemoglobin 9.1.  EKG showed atrial flutter.  Troponin was borderline.  Left shoulder x-ray showed anterior dislocation of left reverse total shoulder arthroplasty.  Patient had CTA chest on 01/16/2022 which showed no evidence of pulmonary embolism but pulmonary hypertension and cardiomegaly.  There was note of 4.7 cm thoracic aortic aneurysm.  Radiology recommended dysemia [place] CT or MRI follow-up and referral to cardiothoracic surgery as outpatient.  Patient was also  seen by orthopedics during hospitalization  and the  CT of left shoulder-showed postsurgical changes and dislocation, large periprosthetic fluid collection surrounding the proximal humerus and extending throughout the left axillary region up to 7.8 X4.4X 8.8-sequelae of postoperative seroma, hematoma versus particle disease.  Patient had 2D echocardiogram on 01/16/2022 which showed reduced LV function and cardiology was consulted.  On 01/17/2022 CT-guided biopsy of the left anterior axillary fluid was done with removal of 30 mils of cloudy fluid with.  Patient underwent removal of shoulder implant with excision of sinus tract antibiotic spacer placement and wound VAC placement by Dr. Marlou Sa on 01/18/2022.  On 01/19/2022, infectious disease was consulted who recommended prolonged IV antibiotic course. Patient has homelessness issues which has been a barrier for his disposition as well.  On 01/26/2022, patient was noted to be anemic and received 1 unit of packed RBC.  Otherwise he has remained stable except for ongoing pain issues.  ? ?  ? ?Assessment & Plan: ? ?Left shoulder prosthetic joint infection ?Left shoulder reverse total shoulder arthroplasty anterior dislocation, chronic with intermittent draining wound over left anterior shoulder ?-s/p CT-guided biopsy by IR on 01/17/2022 with removal of 30 mil of fluid; status post removal of hardware, irrigation/debridement and placement of antibiotic spacer with 6-8 weeks of IV antibiotic.   ?-Orthopedics and infectious disease followed the patient during hospitalization.  ?-Total duration of antibiotics is 6 weeks from 2/28 to 4/11 ? ?Acute on chronic combined systolic and diastolic CHF (congestive heart failure) ([place]) ?-Patient was supposed to be on Bumex which he ran out for several months.  Continue CHF protocol.  Seen by cardiology  during this hospitalization.  ?- On oral lasix daily low dose.  Appears compensated at this time. ?-Continue strict input and output.  Daily  weights.  Fluid restriction.  Negative balance of 15,290 cc since admission. ? ?alcohol abuse ?-No withdrawal symptoms at this time.  Continue thiamine, folic acid. ?  ?Anemia of chronic disease/normocytic anemia ?-Hemoglobin 9 on 02/06/2022.  Monitor intermittently.  Continue vitamin B12.   ?  ?CAD (coronary artery disease) ?CAD s/p cabg x4 1998 ?Chronic trifascicular block ?Hyperlipidemia ?-Continue aspirin and statins.  Hold AV nodal blockers.  ?  ?Left wrist pain.  Seen by orthopedics.   Uric acid of 4.4.  X-ray of the left wrist shows severe osteoarthritis.  Orthopedic recommended  3-day course of colchicine which he has completed. -His pain has improved at this time. ?   ?Back pain ?Chronic back pain, spinal stenosis.    ?-Patient does have high tolerance to pain.  On pain medication regimen oxycodone every 4 hourly.  Continue Lidoderm patch, gabapentin 3 times daily and continue tizanidine. ?  ?Severe protein calorie malnutrition.   ?-Present on admission.  Nutrition on board.  Continue supplements.  Encourage oral nutrition. ?  ?Thoracic aortic aneurysm (TAA) ?-CTA incidentally showed a 4.7 cm thoracic aortic aneurysm, will need semiannual CT or MRI imaging/cardiothoracic surgery follow-up as outpatient ?  ?B12 deficiency ?-Continue vitamin B12 replacement. ?  ?History of pulmonary embolism ?-Completed treatment with Eliquis.  Not currently on anticoagulation due to risk of falls homelessness, history of hematoma, ongoing alcohol abuse. ?  ?Depression/anxiety: ?-Continue hydroxyzine, Xanax, Wellbutrin, buspirone. ?  ?Paroxysmal new onset atrial flutter ([place]) ?Asymptomatic bradycardia: Resolved ?-TSH was within normal limit.  ?- Patient did have pause for 2.90 seconds.  Cardiology was consulted.  Avoid nodal blockers.  Rate controlled at this time. ?  ? ?DVT prophylaxis: Lovenox ?Code Status: Full code ?Family Communication:  None present at bedside.   ?Disposition Plan: To be determined ? ?Patient is  homeless, it is challenging to find a place for him due to his ongoing medical issues requiring prolonged antibiotics.  TOC is aware and working on disposition.  He was given shelter information however patient has refused at this time. ? ?Consultants:  ?Orthopedic surgery ?Cardiology ? ?Procedures:  ?CT-guided aspiration on 01/17/2022 ?Total shoulder arthroplasty with antibiotic spacer placement on 01/18/2022 by Dr. Marlou Sa orthopedics  ? ?Antimicrobials:  ?Vancomycin ? ? ?Subjective: ?Patient seen and examined.  Denies any new complaints.  No chest pain, shortness of breath, fever or vomiting reported.  Complains of intermittent left shoulder pain and anxiety. ? ?Objective: ?Vitals:  ? 02/06/22 2023 02/07/22 0401 02/07/22 0500 02/07/22 0753  ?BP: (!) 111/54 116/60  (!) 130/59  ?Pulse: 80 67  74  ?Resp: '18 18  16  '$ ?Temp: 97.9 ?F (36.6 ?C) 98.1 ?F (36.7 ?C)  99.5 ?F (37.5 ?C)  ?TempSrc: Oral Oral  Oral  ?SpO2: 98% 96%  100%  ?Weight:   89 kg   ?Height:      ? ? ?Intake/Output Summary (Last 24 hours) at 02/07/2022 1252 ?Last data filed at 02/06/2022 2255 ?Gross per 24 hour  ?Intake 810 ml  ?Output 600 ml  ?Net 210 ml  ? ? ?Filed Weights  ? 02/03/22 0456 02/05/22 0900 02/07/22 0500  ?Weight: 89.1 kg 88 kg 89 kg  ? ? ?Examination: ? ?General exam: No acute distress.  On room air.  Looks chronically ill.   ?Respiratory system: Decreased breath sounds at bases bilaterally with some scattered crackles ?Cardiovascular  system: Currently rate controlled; S1-S2 heard  ?gastrointestinal system: Abdomen is distended slightly; soft and nontender.  Bowel sounds are heard  ?Extremities: Left shoulder is in a sling.  Trace lower extremity edema present  ? ?Data Reviewed: I have personally reviewed following labs and imaging studies ? ?CBC: ?Recent Labs  ?Lab 02/01/22 ?1610 02/06/22 ?0409  ?WBC 6.3 5.7  ?HGB 7.6* 9.0*  ?HCT 24.9* 29.6*  ?MCV 85.0 84.8  ?PLT 373 402*  ? ? ?Basic Metabolic Panel: ?Recent Labs  ?Lab 02/01/22 ?9604  02/06/22 ?0409  ?NA 138 138  ?K 4.3 3.9  ?CL 107 104  ?CO2 27 26  ?GLUCOSE 90 194*  ?BUN 40* 41*  ?CREATININE 0.85 0.82  ?CALCIUM 8.4* 8.7*  ?MG 1.9  --   ? ? ?GFR: ?Estimated Creatinine Clearance: 90.6 mL/min (by C-G formula

## 2022-02-07 NOTE — Progress Notes (Signed)
Mobility Specialist Progress Note: ? ? 02/07/22 1056  ?Mobility  ?Activity Ambulated with assistance in hallway  ?Level of Assistance Standby assist, set-up cues, supervision of patient - no hands on  ?Assistive Device [place]  ?Distance Ambulated (ft) 570 ft  ?Activity Response Tolerated well  ?$Mobility charge 1 Mobility  ? ?Pt received in chair willing to participate in mobility. Complaints of pain in both shoulders. Left in chair with call bell in reach and all needs met.   ? ?Sloan Galentine ?Mobility Specialist ?Primary Phone [phone] ? ?

## 2022-02-07 NOTE — Progress Notes (Signed)
Pharmacy Antibiotic Note ? ?Jeremiah [name] is a 71 y.o. male admitted on 01/15/2022 with  MRSA L shoulder joint infection .  Pharmacy has been consulted for Vancomycin dosing. ? ?Scr stable. Vancomycin trough 13 at goal, stable from previous levels. ? ?Plan: ?Continue Vancomycin 1250 MG IV Q24H to 4/11 (6 wks) ?F/u Vanc levels and SCr every Monday   ? ?Height: '5\' 7"'$  (170.2 cm) ?Weight: 89 kg (196 lb 3.4 oz) ?IBW/kg (Calculated) : 66.1 ? ?Temp (24hrs), Avg:98.4 ?F (36.9 ?C), Min:97.9 ?F (36.6 ?C), Max:99.5 ?F (37.5 ?C) ? ?Recent Labs  ?Lab 02/01/22 ?0455 02/04/22 ?0921 02/04/22 ?1505 02/06/22 ?0409 02/07/22 ?1235  ?WBC 6.3  --   --  5.7  --   ?CREATININE 0.85  --   --  0.82  --   ?VANCOTROUGH  --  14*  --   --  13*  ?VANCOPEAK  --   --  54*  --   --   ?  ?Estimated Creatinine Clearance: 90.6 mL/min (by C-G formula based on SCr of 0.82 mg/dL).   ? ?Allergies  ?Allergen Reactions  ? Morphine Hives, Swelling and Rash  ?  Has taken with no reaction since original reaction  ? ? ?Antimicrobials this admission: ?Vanc 2/26 >> 4/11 ?Doxy PTA 2/14 >> 2/25 ? ?Dose adjustments this admission: ?3/3 VP/VT 26/18, AUC 558 on '750mg'$  q12 ?3/9 VP/VT 31/20, AUC 618 >> change to '1250mg'$  q24 ?3/11 SCr bump >>change to '1000mg'$  q24 ?3/14 SCr downt o 0.85>>back to 1250 mg q24h ?3/17 VP/VT 29/14, cAUC 534 on 1250 mg q24h ? ?Microbiology: ?2/26 MRSA PCR - positive ?2/27 abscess - MRSA ?2/28 L joint fluid, swab A - negative ?2/28 L joint fluid, swab B - negative ?2/28 L joint fluid, swab C - negative ? ?Thank you for allowing pharmacy to be a part of this patient?s care. ? ?Benetta Spar, PharmD, BCPS, BCCP ?Clinical Pharmacist ? ?Please check AMION for all [facility] phone numbers ?After 10:00 PM, call [place] [phone] ? ?

## 2022-02-08 DIAGNOSIS — E538 Deficiency of other specified B group vitamins: Secondary | ICD-10-CM | POA: Diagnosis not present

## 2022-02-08 DIAGNOSIS — S41002A Unspecified open wound of left shoulder, initial encounter: Secondary | ICD-10-CM | POA: Diagnosis not present

## 2022-02-08 DIAGNOSIS — I5043 Acute on chronic combined systolic (congestive) and diastolic (congestive) heart failure: Secondary | ICD-10-CM | POA: Diagnosis not present

## 2022-02-08 DIAGNOSIS — F1010 Alcohol abuse, uncomplicated: Secondary | ICD-10-CM | POA: Diagnosis not present

## 2022-02-08 NOTE — Progress Notes (Signed)
?PROGRESS NOTE ? ? ? ?Jeremiah [name]  MHW:[id number] DOB: 1951-05-11 DOA: 01/15/2022 ?PCP: Carol Ada, MD ? ? ?Brief Narrative:  ?71 y.o. male with medical history significant of CAD s/p bypass in 1998, Hx of PE and DVT on Eliquis, HTN, alcohol abuse with hx of withdrawal, opioid dependency previous on Suboxone presented to the hospital with left shoulder and neck pain.  Reportedly patient lost his wife last year and moved out to [place] to live with his brother. However did not get along with his sister-in-law and has recently moved back here to [place] l a week prior to presentation and noted to be homeless and living in [place]. Noted to have multiple admissions recently in [place] for alcohol withdrawal.  On 10/2021 he was admitted following a fall and was noted to have a large left-sided chest wall hematoma  had multiple I&D and wound VAC in January due to persistent refractory hematoma, found to have chronically dislocated left shoulder arthroplasty in the setting of likely infection.  On 02/06/2021-CTA showed large burden of thromboembolic disease with central pulmonary artery distribution.  Saw Pulm in office with Dr Janett Billow at [facility]- who noted 'it sounds like his pulmonary embolism was associated with some lower extremity cellulitis which he was hospitalized and minimally mobile. He was treated with Eliquis, now off.  In the ED on this admission patient was hemodynamically stable.  Labs showed some mild anemia of hemoglobin 9.1.  EKG showed atrial flutter.  Troponin was borderline.  Left shoulder x-ray showed anterior dislocation of left reverse total shoulder arthroplasty.  Patient had CTA chest on 01/16/2022 which showed no evidence of pulmonary embolism but pulmonary hypertension and cardiomegaly.  There was note of 4.7 cm thoracic aortic aneurysm.  Radiology recommended dysemia [place] CT or MRI follow-up and referral to cardiothoracic surgery as outpatient.  Patient was also  seen by orthopedics during hospitalization  and the  CT of left shoulder-showed postsurgical changes and dislocation, large periprosthetic fluid collection surrounding the proximal humerus and extending throughout the left axillary region up to 7.8 X4.4X 8.8-sequelae of postoperative seroma, hematoma versus particle disease.  Patient had 2D echocardiogram on 01/16/2022 which showed reduced LV function and cardiology was consulted.  On 01/17/2022 CT-guided biopsy of the left anterior axillary fluid was done with removal of 30 mils of cloudy fluid with.  Patient underwent removal of shoulder implant with excision of sinus tract antibiotic spacer placement and wound VAC placement by Dr. Marlou Sa on 01/18/2022.  On 01/19/2022, infectious disease was consulted who recommended prolonged IV antibiotic course. Patient has homelessness issues which has been a barrier for his disposition as well.  On 01/26/2022, patient was noted to be anemic and received 1 unit of packed RBC.  Otherwise he has remained stable except for ongoing pain issues.  ? ?  ? ?Assessment & Plan: ? ?Left shoulder prosthetic joint infection ?Left shoulder reverse total shoulder arthroplasty anterior dislocation, chronic with intermittent draining wound over left anterior shoulder ?-s/p CT-guided biopsy by IR on 01/17/2022 with removal of 30 mil of fluid; status post removal of hardware, irrigation/debridement and placement of antibiotic spacer with 6-8 weeks of IV antibiotic.   ?-Orthopedics and infectious disease followed the patient during hospitalization.  ?-Total duration of antibiotics is 6 weeks from 2/28 to 4/11 ? ?Acute on chronic combined systolic and diastolic CHF (congestive heart failure) ([place]) ?-Patient was supposed to be on Bumex which he ran out for several months.  Continue CHF protocol.  Seen by cardiology  during this hospitalization.  ?- On oral lasix daily low dose.  Appears compensated at this time. ?-Continue strict input and output.  Daily  weights.  Fluid restriction.  Negative balance of 16,545 cc since admission. ? ?alcohol abuse ?-No withdrawal symptoms at this time.  Continue thiamine, folic acid. ?  ?Anemia of chronic disease/normocytic anemia ?-Hemoglobin 9 on 02/06/2022.  Monitor intermittently.  Continue vitamin B12.   ?  ?CAD (coronary artery disease) ?CAD s/p cabg x4 1998 ?Chronic trifascicular block ?Hyperlipidemia ?-Continue aspirin and statins.  Hold AV nodal blockers.  ?  ?Left wrist pain.  Seen by orthopedics.   Uric acid of 4.4.  X-ray of the left wrist shows severe osteoarthritis.  Orthopedic recommended  3-day course of colchicine which he has completed. -His pain has improved at this time. ?   ?Back pain ?Chronic back pain, spinal stenosis.    ?-Patient does have high tolerance to pain.  On pain medication regimen oxycodone every 4 hourly.  Continue Lidoderm patch, gabapentin 3 times daily and continue tizanidine. ?  ?Severe protein calorie malnutrition.   ?-Present on admission.  Nutrition on board.  Continue supplements.  Encourage oral nutrition. ?  ?Thoracic aortic aneurysm (TAA) ?-CTA incidentally showed a 4.7 cm thoracic aortic aneurysm, will need semiannual CT or MRI imaging/cardiothoracic surgery follow-up as outpatient ?  ?B12 deficiency ?-Continue vitamin B12 replacement. ?  ?History of pulmonary embolism ?-Completed treatment with Eliquis.  Not currently on anticoagulation due to risk of falls homelessness, history of hematoma, ongoing alcohol abuse. ?  ?Depression/anxiety: ?-Continue hydroxyzine, Xanax, Wellbutrin, buspirone. ?  ?Paroxysmal new onset atrial flutter ([place]) ?Asymptomatic bradycardia: Resolved ?-TSH was within normal limit.  ?- Patient did have pause for 2.90 seconds.  Cardiology was consulted.  Avoid nodal blockers.  Rate controlled at this time. ?  ? ?DVT prophylaxis: Lovenox ?Code Status: Full code ?Family Communication:  None present at bedside.   ?Disposition Plan: To be determined ? ?Patient is  homeless, it is challenging to find a place for him due to his ongoing medical issues requiring prolonged antibiotics.  TOC is aware and working on disposition.  He was given shelter information however patient has refused at this time. ? ?Consultants:  ?Orthopedic surgery ?Cardiology ? ?Procedures:  ?CT-guided aspiration on 01/17/2022 ?Total shoulder arthroplasty with antibiotic spacer placement on 01/18/2022 by Dr. Marlou Sa orthopedics  ? ?Antimicrobials:  ?Vancomycin ? ? ?Subjective: ?Patient seen and examined.  Denies any new complaints.  No abdominal pain, fever or vomiting reported. ? ?Objective: ?Vitals:  ? 02/07/22 1641 02/07/22 2010 02/08/22 0425 02/08/22 0426  ?BP: (!) 145/68 140/72 138/68   ?Pulse: 67 68 67   ?Resp: '16 17 18   '$ ?Temp: 97.7 ?F (36.5 ?C) 98.5 ?F (36.9 ?C) 97.8 ?F (36.6 ?C)   ?TempSrc: Oral Oral Oral   ?SpO2: 100% 100% 100%   ?Weight:    88.5 kg  ?Height:      ? ? ?Intake/Output Summary (Last 24 hours) at 02/08/2022 0730 ?Last data filed at 02/08/2022 0424 ?Gross per 24 hour  ?Intake 360 ml  ?Output 2700 ml  ?Net -2340 ml  ? ? ?Filed Weights  ? 02/05/22 0900 02/07/22 0500 02/08/22 0426  ?Weight: 88 kg 89 kg 88.5 kg  ? ? ?Examination: ? ?General exam: Currently on room air.  No distress.  Looks chronically ill.   ?Respiratory system: Bilateral decreased breath sounds at bases with scattered crackles ?Cardiovascular system: S1-S2 heard; rate controlled currently ?gastrointestinal system: Abdomen is mildly distended; soft and  nontender.  Normal bowel sounds heard  ?extremities: Left shoulder is in a sling.  Mild lower extremity edema present ? ?Data Reviewed: I have personally reviewed following labs and imaging studies ? ?CBC: ?Recent Labs  ?Lab 02/06/22 ?0409  ?WBC 5.7  ?HGB 9.0*  ?HCT 29.6*  ?MCV 84.8  ?PLT 402*  ? ? ?Basic Metabolic Panel: ?Recent Labs  ?Lab 02/06/22 ?0409  ?NA 138  ?K 3.9  ?CL 104  ?CO2 26  ?GLUCOSE 194*  ?BUN 41*  ?CREATININE 0.82  ?CALCIUM 8.7*  ? ? ?GFR: ?Estimated Creatinine  Clearance: 90.3 mL/min (by C-G formula based on SCr of 0.82 mg/dL). ?Liver Function Tests: ?No results for input(s): AST, ALT, ALKPHOS, BILITOT, PROT, ALBUMIN in the last 168 hours. ?No results for input(s): LIPASE,

## 2022-02-08 NOTE — Progress Notes (Addendum)
Nutrition Follow-up ? ?DOCUMENTATION CODES:  ?Severe malnutrition in context of social or environmental circumstances ? ?INTERVENTION:  ?Continue current diet as ordered, encourage PO intake ?Continue snacks between meals ?Ensure Max po 2x/d, each supplement provides 150 kcal and 30 grams of protein.  ?MVI with minerals daily ? ?NUTRITION DIAGNOSIS:  ?Severe Malnutrition related to social / environmental circumstances (etoh and drug use, hx of gastric bypass) as evidenced by severe fat depletion, severe muscle depletion. ?- remains applicable ? ?GOAL:  ?Patient will meet greater than or equal to 90% of their needs ?- nutrition supplements in place ? ?MONITOR:  ?PO intake, Labs, Supplement acceptance, Weight trends ? ?REASON FOR ASSESSMENT:  ?Consult ?Assessment of nutrition requirement/status, Poor PO, Wound healing ? ?ASSESSMENT:  ?71 y.o. male with history of CAD s/p CABGx4, HTN, HLD, CHF (EF 35-40%), GERD, gout, alcohol abuse, opioid dependency, hx of gastric bypass surgery, and CKD2, presented to ED with worsening left shoulder pain and left-sided neck pain from an injury which occurred ~a year ago. Noted pt is currently homeless and has been for about 6 months. ? ?2/28 - Op, left RSA implant removal with I&D of shoulder joint, placement of antibiotic spacer and wound vac. Hemovac drain placed ? ?Pt resting in recliner at the time of assessment. Breakfast tray at bedside noted to be well consumed but overall intake declined over the past week. Pt reports that he is having some shoulder pain this AM.  ? ?Noted that all snacks from yesterday were in nourishment room refrigerator and were not delivered to pt. Took in 8PM snack from 3/20 and snack from 10AM today for pt to have. Discussed with RN. ? ?Zinc x 14 days complete. Will also dc vitamin c and juven as it has been almost 1 month since surgery and would vac removed. Incisions are closed. ? ?Average Meal Intake: ?2/26-3/1: 91% intake x 7 recorded  meal ?3/2-3/6: 69% intake x 5 recorded meals ?3/7-3/13: 100% intake x 10 recorded meals ?3/14-3/21: 60% intake x 10 recorded meals ? ?Nutritionally Relevant Medications: ?Scheduled Meds: ? vitamin C  500 mg Oral Daily  ? cholecalciferol  1,000 Units Oral Daily  ? docusate sodium  100 mg Oral BID  ? furosemide  20 mg Oral Daily  ? multivitamin with minerals  1 tablet Oral Daily  ? JUVEN  1 packet Oral BID BM  ? pantoprazole  40 mg Oral BID  ? potassium chloride  40 mEq Oral Daily  ? Ensure Max Protein  11 oz Oral BID  ? rosuvastatin  40 mg Oral Daily  ? vitamin B-12  1,000 mcg Oral Daily  ? ?Continuous Infusions: ? vancomycin 1,250 mg (02/08/22 0903)  ? ?PRN Meds: metoCLOPramide ? ?Labs Reviewed: ?BUN 41  ? ?Vitamin Labs ?Vitamin A 25.7 (normal, 3/2) ?Vitamin B1 108.8 (normal, 3/2) ?Vitamin B6 2.0 (low, 3/2) ?Vitamin B12 176 (low, 2/26)  ?Vitamin C .6 (normal, 3/2) ?Vitamin D 13.65 (low, 3/2) ?Vitamin E 9.0 (alpha), 1.1 (gamma) (normal, 3/2) ?Vitamin K .46 (normal, 3/2) ?Folate 20.8 (normal, 2/26) ?Copper 97 (normal 3/2) ?Zinc 45 (normal, 3/2) ?Iron 24 (low, 2/26) ? ?NUTRITION - FOCUSED PHYSICAL EXAM: ?Flowsheet Row Most Recent Value  ?Orbital Region Severe depletion  ?Upper Arm Region Severe depletion  ?Thoracic and Lumbar Region Severe depletion  ?Buccal Region Severe depletion  ?Temple Region Severe depletion  ?Clavicle Bone Region Mild depletion  ?Clavicle and Acromion Bone Region Mild depletion  ?Scapular Bone Region Mild depletion  ?Dorsal Hand Severe depletion  ?Patellar Region  Moderate depletion  ?Anterior Thigh Region Moderate depletion  ?Posterior Calf Region Severe depletion  ?Edema (RD Assessment) Mild  ?Hair Reviewed  ?Eyes Reviewed  ?Mouth Reviewed  ?Skin Reviewed  [scattered brusing]  ?Nails Reviewed  ? ?Diet Order:   ?Diet Order   ? ?       ?  Diet regular Room service appropriate? Yes; Fluid consistency: Thin  Diet effective now       ?  ? ?  ?  ? ?  ? ? ?EDUCATION NEEDS:  ?Education needs have been  addressed ? ?Skin:  Skin Assessment: Skin Integrity Issues: ?Skin Integrity Issues:: Other (Comment), Incisions ?Incisions: left shoulder ?Other: abrasions to the head, legs, and shoulders; ecchymosis to the bilateral arms and legs ? ?Last BM:  3/18 ? ?Height:  ?Ht Readings from Last 1 Encounters:  ?01/18/22 '5\' 7"'$  (1.702 m)  ? ? ?Weight:  ?Wt Readings from Last 1 Encounters:  ?02/08/22 88.5 kg  ? ? ?Ideal Body Weight:  67.3 kg ? ?BMI:  Body mass index is 30.56 kg/m?. ? ?Estimated Nutritional Needs:  ?Kcal:  2200-2500 kcal/d ?Protein:  120-135g/d ?Fluid:  2.3-2.5 L/d ? ? ?Ranell Patrick, RD, LDN ?Clinical Dietitian ?RD pager # available in [place]  ?After hours/weekend pager # available in [place] ?

## 2022-02-08 NOTE — Care Management Important Message (Signed)
Important Message ? ?Patient Details  ?Name: Jeremiah [name] ?MRN: [id number] ?Date of Birth: 03-18-1951 ? ? ?Medicare Important Message Given:  Yes ? ? ? ? ?[name]  Snyder Colavito-Martin ?02/08/2022, 1:18 PM ?

## 2022-02-08 NOTE — Progress Notes (Signed)
Physical Therapy Treatment ?Patient Details ?Name: Jeremiah [name] ?MRN: [id number] ?DOB: 1951-04-15 ?Today's Date: 02/08/2022 ? ? ?History of Present Illness Pt is a 71 y/o M presenting to ED on 2/25 wtih L shoulder wound with pain radiating to chest. Found to have L shoulder anteromedial dislocation after fall 3 months ago. S/p removal of reverse shoulder implant with antibiotic spacer on 2/28. PMH includes CAD s/p bypass, hx of PE and DVT on eliquis, HTN and alcohol abuse. ? ?  ?PT Comments  ? ? Pt with continued progress this session. Pt stating compliance with ankle/toe stretching and ROM and reports decreased pain and tightness. Pt requiring down to supervision during ambulation with no LOB noted. Min guard while turning and looking R/L while ambulating. Pt continues to benefit from skilled PT services to progress toward functional mobility goals.  ?  ?Recommendations for follow up therapy are one component of a multi-disciplinary discharge planning process, led by the attending physician.  Recommendations may be updated based on patient status, additional functional criteria and insurance authorization. ? ?Follow Up Recommendations ? Home health PT ?  ?  ?Assistance Recommended at Discharge Intermittent Supervision/Assistance  ?Patient can return home with the following A little help with walking and/or transfers;A little help with bathing/dressing/bathroom;Assist for transportation;Help with stairs or ramp for entrance ?  ?Equipment Recommendations ? Cane  ?  ?Recommendations for Other Services   ? ? ?  ?Precautions / Restrictions Precautions ?Precautions: Fall ?Precaution Comments: L shoulder precautions ?Required Braces or Orthoses: Sling ?Restrictions ?Weight Bearing Restrictions: Yes ?LUE Weight Bearing: Non weight bearing ?Other Position/Activity Restrictions: sling on at all times except for dressing/exercise, AROM of hand/wrist/elbow ok, no shoulder AROM/PROM per MD  ?  ? ?Mobility ? Bed Mobility ?Overal  bed mobility: Modified Independent ?Bed Mobility: Supine to Sit ?  ?  ?Supine to sit: Modified independent (Device/Increase time) ?  ?  ?  ?  ? ?Transfers ?Overall transfer level: Needs assistance ?Equipment used: Straight cane ?Transfers: Sit to/from Stand ?Sit to Stand: Supervision ?  ?  ?  ?  ?  ?  ?  ? ?Ambulation/Gait ?Ambulation/Gait assistance: Min guard, Supervision ?Gait Distance (Feet): 510 Feet (seated recovery break x1) ?Assistive device: Straight cane ?Gait Pattern/deviations: Step-through pattern, Decreased step length - right, Decreased step length - left, Wide base of support, Decreased stride length, Decreased stance time - left ?Gait velocity: decreased ?  ?  ?General Gait Details: generalized unsteadiness upon standing, specifially when turning, wide base of support, can correct but states it feels unsteady, much slower this session seconadry to L ankle pain ? ? ?Stairs ?  ?  ?  ?  ?  ? ? ?Wheelchair Mobility ?  ? ?Modified Rankin (Stroke Patients Only) ?  ? ? ?  ?Balance Overall balance assessment: Needs assistance ?  ?Sitting balance-Leahy Scale: Good ?  ?  ?  ?Standing balance-Leahy Scale: Fair ?  ?  ?  ?  ?  ?  ?  ?  ?  ?  ?  ?  ?  ? ?  ?Cognition Arousal/Alertness: Awake/alert ?Behavior During Therapy: Flat affect ?Overall Cognitive Status: Impaired/Different from baseline ?Area of Impairment: Problem solving ?  ?  ?  ?  ?  ?  ?  ?  ?  ?  ?  ?  ?  ?  ?Problem Solving: Decreased initiation, Requires verbal cues ?  ?  ?  ? ?  ?Exercises Other Exercises ?Other Exercises: AROM all planes of L  ankle ? ?  ?General Comments   ?  ?  ? ?Pertinent Vitals/Pain Pain Assessment ?Faces Pain Scale: Hurts even more ?Pain Location: L ankle and toes ?Pain Descriptors / Indicators: Aching, Sore  ? ? ?Home Living   ?  ?  ?  ?  ?  ?  ?  ?  ?  ?   ?  ?Prior Function    ?  ?  ?   ? ?PT Goals (current goals can now be found in the care plan section) Acute Rehab PT Goals ?PT Goal Formulation: With patient ?Time For  Goal Achievement: 02/02/22 ? ?  ?Frequency ? ? ? Min 5X/week ? ? ? ?  ?PT Plan    ? ? ?Co-evaluation   ?  ?  ?  ?  ? ?  ?AM-PAC PT "6 Clicks" Mobility   ?Outcome Measure ? Help needed turning from your back to your side while in a flat bed without using bedrails?: None ?Help needed moving from lying on your back to sitting on the side of a flat bed without using bedrails?: A Little ?Help needed moving to and from a bed to a chair (including a wheelchair)?: A Little ?Help needed standing up from a chair using your arms (e.g., wheelchair or bedside chair)?: A Little ?Help needed to walk in hospital room?: A Little ?Help needed climbing 3-5 steps with a railing? : A Little ?6 Click Score: 19 ? ?  ?End of Session Equipment Utilized During Treatment: Gait belt ?Activity Tolerance: Patient limited by pain ?Patient left: in chair;with call bell/phone within reach;with chair alarm set ?Nurse Communication: Mobility status ?PT Visit Diagnosis: Unsteadiness on feet (R26.81);Muscle weakness (generalized) (M62.81);Pain ?Pain - Right/Left: Left ?Pain - part of body: Arm;Shoulder ?  ? ? ?Time: 0981-1914 ?PT Time Calculation (min) (ACUTE ONLY): 23 min ? ?Charges:  $Therapeutic Exercise: 23-37 mins          ?          ? ?[name]. PTA ?Acute Rehabilitation Services ?Office: [phone] ? ? ? ?Betsey Holiday Chritopher Coster ?02/08/2022, 3:54 PM ? ?

## 2022-02-08 NOTE — Progress Notes (Signed)
Mobility Specialist Progress Note: ? ? 02/08/22 1121  ?Mobility  ?Activity Ambulated with assistance in hallway  ?Level of Assistance Modified independent, requires aide device or extra time  ?Assistive Device [place]  ?Distance Ambulated (ft) 570 ft  ?Activity Response Tolerated well  ?$Mobility charge 1 Mobility  ? ?Pt received at door wanting to go look outside. Complaints of shoulder and ankle pain. Left in chair with call bell in reach and all needs met. ? ?Jeremiah [name] ?Mobility Specialist ?Primary Phone [phone] ? ?

## 2022-02-09 DIAGNOSIS — S41002A Unspecified open wound of left shoulder, initial encounter: Secondary | ICD-10-CM | POA: Diagnosis not present

## 2022-02-09 NOTE — Progress Notes (Signed)
Physical Therapy Treatment ?Patient Details ?Name: Jeremiah [name] ?MRN: [id number] ?DOB: 1951/11/04 ?Today's Date: 02/09/2022 ? ? ?History of Present Illness Pt is a 71 y/o M presenting to ED on 2/25 wtih L shoulder wound with pain radiating to chest. Found to have L shoulder anteromedial dislocation after fall 3 months ago. S/p removal of reverse shoulder implant with antibiotic spacer on 2/28. PMH includes CAD s/p bypass, hx of PE and DVT on eliquis, HTN and alcohol abuse. ? ?  ?PT Comments  ? ? Pt agreeable to session with encouragement, requiring grossly min guard for ambulation. Pt educated on importance of continued mobility as pt stating he doesn't know the point anymore. Pt verbalized understanding. Pt continuing to perseverate on social situation and where he will live after d/c. Reviewed handout with shelter/community assistance. Pt continues to benefit from skilled PT services to progress toward functional mobility goals.  ?  ?Recommendations for follow up therapy are one component of a multi-disciplinary discharge planning process, led by the attending physician.  Recommendations may be updated based on patient status, additional functional criteria and insurance authorization. ? ?Follow Up Recommendations ? Home health PT ?  ?  ?Assistance Recommended at Discharge Intermittent Supervision/Assistance  ?Patient can return home with the following A little help with walking and/or transfers;A little help with bathing/dressing/bathroom;Assist for transportation;Help with stairs or ramp for entrance ?  ?Equipment Recommendations ? Cane  ?  ?Recommendations for Other Services   ? ? ?  ?Precautions / Restrictions Precautions ?Precautions: Fall ?Precaution Comments: L shoulder precautions ?Required Braces or Orthoses: Sling ?Restrictions ?Weight Bearing Restrictions: Yes ?LUE Weight Bearing: Non weight bearing ?Other Position/Activity Restrictions: sling on at all times except for dressing/exercise, AROM of  hand/wrist/elbow ok, no shoulder AROM/PROM per MD  ?  ? ?Mobility ? Bed Mobility ?  ?  ?  ?  ?  ?  ?  ?General bed mobility comments: pt in chair on arrival ?  ? ?Transfers ?Overall transfer level: Needs assistance ?Equipment used: Straight cane ?Transfers: Sit to/from Stand ?Sit to Stand: Supervision ?  ?  ?  ?  ?  ?  ?  ? ?Ambulation/Gait ?Ambulation/Gait assistance: Min guard, Supervision ?Gait Distance (Feet): 520 Feet (seated recovery x1) ?Assistive device: Straight cane ?Gait Pattern/deviations: Step-through pattern, Decreased step length - right, Decreased step length - left, Wide base of support, Decreased stride length, Decreased stance time - left ?Gait velocity: decreased ?  ?  ?General Gait Details: generalized unsteadiness upon standing, specifially when turning, wide base of support, can correct but states it feels unsteady, much slower this session seconadry to L ankle pain ? ? ?Stairs ?  ?  ?  ?  ?  ? ? ?Wheelchair Mobility ?  ? ?Modified Rankin (Stroke Patients Only) ?  ? ? ?  ?Balance Overall balance assessment: Needs assistance ?  ?Sitting balance-Leahy Scale: Good ?  ?  ?Standing balance support: Single extremity supported ?Standing balance-Leahy Scale: Fair ?  ?  ?  ?  ?  ?  ?  ?  ?  ?  ?  ?  ?  ? ?  ?Cognition Arousal/Alertness: Awake/alert ?Behavior During Therapy: Flat affect ?Overall Cognitive Status: Impaired/Different from baseline ?Area of Impairment: Problem solving ?  ?  ?  ?  ?  ?  ?  ?  ?  ?  ?  ?  ?  ?  ?Problem Solving: Decreased initiation, Requires verbal cues ?General Comments: pt perseverating on where he will go at  d/c ?  ?  ? ?  ?Exercises   ? ?  ?General Comments   ?  ?  ? ?Pertinent Vitals/Pain Pain Assessment ?Pain Assessment: Faces ?Faces Pain Scale: Hurts a little bit ?Pain Location: L shoulder ?Pain Descriptors / Indicators: Sore ?Pain Intervention(s): Limited activity within patient's tolerance, Monitored during session  ? ? ?Home Living   ?  ?  ?  ?  ?  ?  ?  ?  ?  ?    ?  ?Prior Function    ?  ?  ?   ? ?PT Goals (current goals can now be found in the care plan section) Acute Rehab PT Goals ?PT Goal Formulation: With patient ?Time For Goal Achievement: 02/02/22 ? ?  ?Frequency ? ? ? Min 5X/week ? ? ? ?  ?PT Plan    ? ? ?Co-evaluation   ?  ?  ?  ?  ? ?  ?AM-PAC PT "6 Clicks" Mobility   ?Outcome Measure ? Help needed turning from your back to your side while in a flat bed without using bedrails?: None ?Help needed moving from lying on your back to sitting on the side of a flat bed without using bedrails?: A Little ?Help needed moving to and from a bed to a chair (including a wheelchair)?: A Little ?Help needed standing up from a chair using your arms (e.g., wheelchair or bedside chair)?: A Little ?Help needed to walk in hospital room?: A Little ?Help needed climbing 3-5 steps with a railing? : A Little ?6 Click Score: 19 ? ?  ?End of Session Equipment Utilized During Treatment: Gait belt ?Activity Tolerance: Patient limited by pain ?Patient left: in chair;with call bell/phone within reach;with nursing/sitter in room ?Nurse Communication: Mobility status ?PT Visit Diagnosis: Unsteadiness on feet (R26.81);Muscle weakness (generalized) (M62.81);Pain ?Pain - Right/Left: Left ?Pain - part of body: Arm;Shoulder ?  ? ? ?Time: 9450-3888 ?PT Time Calculation (min) (ACUTE ONLY): 19 min ? ?Charges:  $Therapeutic Exercise: 8-22 mins          ?          ? ?[name]. PTA ?Acute Rehabilitation Services ?Office: [phone] ? ? ? ?Betsey Holiday Olita Takeshita ?02/09/2022, 4:23 PM ? ?

## 2022-02-09 NOTE — Progress Notes (Signed)
Mobility Specialist Progress Note: ? ? 02/09/22 1153  ?Mobility  ?Activity Ambulated with assistance in hallway  ?Level of Assistance Independent  ?Assistive Device [place]  ?Distance Ambulated (ft) 570 ft  ?Activity Response Tolerated well  ?$Mobility charge 1 Mobility  ? ?Pt received EOB willing to participate in mobility. Complaints of L shoulder and L ankle pain. Required 1 seated break. Left in chair with call bell in reach and all needs met.  ? ?Rhylen Pulido ?Mobility Specialist ?Primary Phone [phone] ? ?

## 2022-02-09 NOTE — Progress Notes (Signed)
?PROGRESS NOTE ? ? ? ?Jeremiah [name]  YJE:[id number] DOB: 10/22/1951 DOA: 01/15/2022 ? ?PCP: Carol Ada, MD ? ? ?Brief Narrative:  ?This 71 y.o. male with medical history significant of CAD s/p bypass in 1998, Hx of PE and DVT on Eliquis, HTN, alcohol abuse with hx of withdrawal, opioid dependency previously on Suboxone presented to the hospital with left shoulder and neck pain.  Reportedly patient lost his wife last year and moved out to [place] to live with his brother. However did not get along with his sister-in-law and has recently moved back here to [place] one week prior to presentation and noted to be homeless and living in [place]. Noted to have multiple admissions recently in [place] for alcohol withdrawal.  On 10/2021 he was admitted following a fall and was noted to have a large left-sided chest wall hematoma  had multiple I&D and wound VAC in January due to persistent refractory hematoma, found to have chronically dislocated left shoulder arthroplasty in the setting of likely infection.  On 02/06/2021-CTA showed large burden of thromboembolic disease with central pulmonary artery distribution.  Saw Pulm in office with Dr Janett Billow at [facility]- who noted 'it sounds like his pulmonary embolism was associated with some lower extremity cellulitis which he was hospitalized and minimally mobile. He was treated with Eliquis, now off.  In the ED on this admission patient was hemodynamically stable.  Labs showed some mild anemia of hemoglobin 9.1.  EKG showed atrial flutter.  Troponin was borderline.  Left shoulder x-ray showed anterior dislocation of left reverse total shoulder arthroplasty.  Patient had CTA chest on 01/16/2022 which showed no evidence of pulmonary embolism but pulmonary hypertension and cardiomegaly.  There was note of 4.7 cm thoracic aortic aneurysm.  Radiology recommended dysemia [place] CT or MRI follow-up and referral to cardiothoracic surgery as outpatient.  Patient  was also seen by orthopedics during hospitalization  and the  CT of left shoulder-showed postsurgical changes and dislocation, large periprosthetic fluid collection surrounding the proximal humerus and extending throughout the left axillary region up to 7.8 X4.4X 8.8-sequelae of postoperative seroma, hematoma versus particle disease.  Patient had 2D echocardiogram on 01/16/2022 which showed reduced LV function and cardiology was consulted.  On 01/17/2022 CT-guided biopsy of the left anterior axillary fluid was done with removal of 30 mils of cloudy fluid with.  Patient underwent removal of shoulder implant with excision of sinus tract antibiotic spacer placement and wound VAC placement by Dr. Marlou Sa on 01/18/2022.  On 01/19/2022, infectious disease was consulted who recommended prolonged IV antibiotic course. Patient has homelessness issues which has been a barrier for his disposition as well.  On 01/26/2022, patient was noted to be anemic and received 1 unit of packed RBC. Otherwise he has remained stable except for ongoing pain issues.  ? ?  ?Assessment & Plan: ? ?Left shoulder prosthetic joint infection: ?Left shoulder reverse total shoulder arthroplasty anterior dislocation, chronic with intermittent draining wound over left anterior shoulder:  ?-s/p CT-guided biopsy by IR on 01/17/2022 with removal of 30 mil of fluid; status post removal of hardware, irrigation/debridement and placement of antibiotic spacer with 6-8 weeks of IV antibiotics.   ?-Orthopedics and infectious disease followed the patient during hospitalization.  ?-Total duration of antibiotics is 6 weeks from 2/28 to 4/11. ? ?Acute on chronic combined systolic and diastolic CHF (congestive heart failure) ([place]) ?-Patient was supposed to be on Bumex which he ran out for several months.   ?-Continue CHF protocol.  Seen by  cardiology during this hospitalization.  ?-Continue lasix daily low dose.  Appears compensated at this time. ?-Continue strict input and  output.  Daily weights.  Fluid restriction.   ?-Negative balance of 16,545 cc since admission. ? ?ETOH Abuse: ?-No withdrawal symptoms at this time.   ?-Continue thiamine, folic acid. ?  ?Anemia of chronic disease/ Normocytic anemia ?-Hb 9 on 02/06/2022.   ?-Monitor intermittently.  Continue vitamin B12.   ?  ?CAD s/p CABG x4 1998 ?Chronic trifascicular block ?-Continue aspirin and statins.  Hold AV nodal blockers.  ?  ?Left wrist pain > Resolved. ?Seen by orthopedics.   Uric acid of 4.4.  X-ray of the left wrist shows severe osteoarthritis.   ?Orthopedic recommended  3-day course of colchicine which he has completed. - ?His pain has improved at this time. ?   ?Back pain ?Chronic back pain, spinal stenosis.    ?-Patient does have high tolerance to pain.  Continue oxycodone every 4 hourly.   ?Continue Lidoderm patch, gabapentin 3 times daily and continue tizanidine. ?  ?Severe protein calorie malnutrition.   ?-Present on admission.  Nutrition on board.   ?Continue supplements.  Encourage oral nutrition. ?  ?Thoracic aortic aneurysm (TAA) ?-CTA incidentally showed a 4.7 cm thoracic aortic aneurysm,  ?-Needs  semiannual CT or MRI imaging/cardiothoracic surgery follow-up as outpatient ?  ?B12 deficiency ?-Continue vitamin B12 replacement. ?  ?History of pulmonary embolism ?-Completed treatment with Eliquis.   ?Not currently on anticoagulation due to risk of falls homelessness, history of hematoma, ongoing alcohol abuse. ?  ?Depression/anxiety: ?-Continue hydroxyzine, Xanax, Wellbutrin, buspirone. ?  ?Paroxysmal new onset atrial flutter ([place]) ?Asymptomatic bradycardia: Resolved ?-TSH was within normal limit.  ?- Patient did have pause for 2.90 seconds.   ?-Cardiology was consulted.  Avoid nodal blockers.  Rate controlled at this time. ?  ? ?DVT prophylaxis: Lovenox ?Code Status: Full code ?Family Communication: No family at bedside ?Disposition Plan: To be determined ? ?Patient is homeless, it is challenging to find a  place for him due to his ongoing medical issues requiring prolonged antibiotics.  TOC is aware and working on disposition.  He was given shelter information however patient has refused at this time. ? ?Consultants:  ?Orthopedic surgery ?Cardiology ? ?Procedures:  ?CT-guided aspiration on 01/17/2022 ?Total shoulder arthroplasty with antibiotic spacer placement on 01/18/2022 by Dr. Marlou Sa orthopedics  ? ?Antimicrobials:  ?Vancomycin ? ? ?Subjective: ?Patient was seen and examined at bedside.  Overnight events noted. ?He reports feeling better, seems frustrated.  He reports pain is not controlled. ? ?Objective: ?Vitals:  ? 02/08/22 1712 02/08/22 2021 02/08/22 2313 02/09/22 1010  ?BP: (!) 158/88 (!) 115/57  (!) 127/59  ?Pulse: (!) 58 82  63  ?Resp: '18 19  18  '$ ?Temp: 98.3 ?F (36.8 ?C) 98.8 ?F (37.1 ?C)  98.2 ?F (36.8 ?C)  ?TempSrc: Oral Oral  Oral  ?SpO2: 100% 100%  100%  ?Weight:   87.7 kg   ?Height:      ? ? ?Intake/Output Summary (Last 24 hours) at 02/09/2022 1318 ?Last data filed at 02/09/2022 9678 ?Gross per 24 hour  ?Intake --  ?Output 650 ml  ?Net -650 ml  ? ?Filed Weights  ? 02/07/22 0500 02/08/22 0426 02/08/22 2313  ?Weight: 89 kg 88.5 kg 87.7 kg  ? ? ?Examination: ? ?General exam: Appears comfortable, not in any acute distress,  seems chronically ill looking. ?Respiratory system: Decreased breath sounds, no wheezing, normal respiratory effort. ?Cardiovascular system: S1-S2 heard, regular rate and rhythm, no  murmur. ?gastrointestinal system: Abdomen is soft, mildly distended, non tender, BS+. ?extremities: Left shoulder is in a sling.  Mild lower extremity edema present. ? ?Data Reviewed: I have personally reviewed following labs and imaging studies ? ?CBC: ?Recent Labs  ?Lab 02/06/22 ?0409  ?WBC 5.7  ?HGB 9.0*  ?HCT 29.6*  ?MCV 84.8  ?PLT 402*  ? ?Basic Metabolic Panel: ?Recent Labs  ?Lab 02/06/22 ?0409  ?NA 138  ?K 3.9  ?CL 104  ?CO2 26  ?GLUCOSE 194*  ?BUN 41*  ?CREATININE 0.82  ?CALCIUM 8.7*  ? ?GFR: ?Estimated  Creatinine Clearance: 89.8 mL/min (by C-G formula based on SCr of 0.82 mg/dL). ?Liver Function Tests: ?No results for input(s): AST, ALT, ALKPHOS, BILITOT, PROT, ALBUMIN in the last 168 hours. ?No results for inp

## 2022-02-09 NOTE — Progress Notes (Signed)
Occupational Therapy Treatment ?Patient Details ?Name: Jeremiah [name] ?MRN: [id number] ?DOB: 1951-05-04 ?Today's Date: 02/09/2022 ? ? ?History of present illness Pt is a 71 y/o M presenting to ED on 2/25 wtih L shoulder wound with pain radiating to chest. Found to have L shoulder anteromedial dislocation after fall 3 months ago. S/p removal of reverse shoulder implant with antibiotic spacer on 2/28. PMH includes CAD s/p bypass, hx of PE and DVT on eliquis, HTN and alcohol abuse. ?  ?OT comments ? Pt continues to ruminate about social situation. Removed sling, performed elbow to hand AROM x 10 each, assisted to change soiled gown. Educated in donning and doffing sling.  ? ?Recommendations for follow up therapy are one component of a multi-disciplinary discharge planning process, led by the attending physician.  Recommendations may be updated based on patient status, additional functional criteria and insurance authorization. ?   ?Follow Up Recommendations ? Home health OT  ?  ?Assistance Recommended at Discharge Intermittent Supervision/Assistance  ?Patient can return home with the following ? A little help with walking and/or transfers;Assistance with cooking/housework;Direct supervision/assist for financial management;Assist for transportation;Help with stairs or ramp for entrance;A lot of help with bathing/dressing/bathroom ?  ?Equipment Recommendations ? None recommended by OT  ?  ?Recommendations for Other Services   ? ?  ?Precautions / Restrictions Precautions ?Precautions: Fall ?Precaution Comments: L shoulder precautions ?Required Braces or Orthoses: Sling ?Restrictions ?Weight Bearing Restrictions: Yes ?LUE Weight Bearing: Non weight bearing ?Other Position/Activity Restrictions: sling on at all times except for dressing/exercise, AROM of hand/wrist/elbow ok, no shoulder AROM/PROM per MD  ? ? ?  ? ?Mobility Bed Mobility ?  ?  ?  ?  ?  ?  ?  ?General bed mobility comments: pt in chair on arrival ?   ? ?Transfers ?Overall transfer level: Needs assistance ?  ?Transfers: Sit to/from Stand ?Sit to Stand: Supervision ?  ?  ?  ?  ?  ?General transfer comment: stood to adjust sling ?  ?  ?Balance Overall balance assessment: Needs assistance ?  ?Sitting balance-Leahy Scale: Good ?  ?  ?Standing balance support: Single extremity supported ?Standing balance-Leahy Scale: Fair ?  ?  ?  ?  ?  ?  ?  ?  ?  ?  ?  ?  ?   ? ?ADL either performed or assessed with clinical judgement  ? ?ADL Overall ADL's : Needs assistance/impaired ?  ?  ?  ?  ?  ?  ?  ?  ?Upper Body Dressing : Minimal assistance;Sitting ?Upper Body Dressing Details (indicate cue type and reason): changed soiled gown ?  ?  ?  ?  ?  ?  ?  ?  ?  ?  ?  ? ?Extremity/Trunk Assessment   ?  ?  ?  ?  ?  ? ?Vision   ?  ?  ?Perception   ?  ?Praxis   ?  ? ?Cognition Arousal/Alertness: Awake/alert ?Behavior During Therapy: Flat affect ?Overall Cognitive Status: Impaired/Different from baseline ?Area of Impairment: Problem solving ?  ?  ?  ?  ?  ?  ?  ?  ?  ?  ?  ?  ?  ?  ?Problem Solving: Decreased initiation, Requires verbal cues ?General Comments: pt with multiple anticipatory complaints about social situation and pain when he is allowed shoulder exercises ?  ?  ?   ?Exercises Exercises: General Upper Extremity ?General Exercises - Upper Extremity ?Elbow Flexion: Left, 10 reps, Standing,  AROM ?Elbow Extension: AROM, Left, 10 reps, Standing ?Wrist Flexion: AROM, Left, Seated, 10 reps ?Wrist Extension: AROM, Left, 10 reps, Seated ?Digit Composite Flexion: AROM, Left, 10 reps, Seated ?Composite Extension: AROM, Left, 10 reps, Seated ? ?  ?Shoulder Instructions   ? ? ?  ?General Comments    ? ? ?Pertinent Vitals/ Pain       Pain Assessment ?Pain Assessment: Faces ?Faces Pain Scale: Hurts a little bit ?Pain Location: L shoulder ?Pain Descriptors / Indicators: Sore ?Pain Intervention(s): Premedicated before session, Repositioned ? ?Home Living   ?  ?  ?  ?  ?  ?  ?  ?  ?  ?  ?   ?  ?  ?  ?  ?  ?  ?  ? ?  ?Prior Functioning/Environment    ?  ?  ?  ?   ? ?Frequency ? Min 2X/week  ? ? ? ? ?  ?Progress Toward Goals ? ?OT Goals(current goals can now be found in the care plan section) ? Progress towards OT goals: Progressing toward goals ? ?Acute Rehab OT Goals ?OT Goal Formulation: With patient ?Time For Goal Achievement: 02/15/22 ?Potential to Achieve Goals: Good  ?Plan Discharge plan remains appropriate;Frequency needs to be updated   ? ?Co-evaluation ? ? ?   ?  ?  ?  ?  ? ?  ?AM-PAC OT "6 Clicks" Daily Activity     ?Outcome Measure ? ? Help from another person eating meals?: None ?Help from another person taking care of personal grooming?: A Little ?Help from another person toileting, which includes using toliet, bedpan, or urinal?: A Little ?Help from another person bathing (including washing, rinsing, drying)?: A Lot ?Help from another person to put on and taking off regular upper body clothing?: A Little ?Help from another person to put on and taking off regular lower body clothing?: A Lot ?6 Click Score: 17 ? ?  ?End of Session Equipment Utilized During Treatment: Other (comment) (sling, cane) ? ?OT Visit Diagnosis: Unsteadiness on feet (R26.81);Other abnormalities of gait and mobility (R26.89);Muscle weakness (generalized) (M62.81);Pain ?  ?Activity Tolerance Patient tolerated treatment well ?  ?Patient Left in chair;with call bell/phone within reach;with chair alarm set ?  ?Nurse Communication   ?  ? ?   ? ?Time: 1206-1224 ?OT Time Calculation (min): 18 min ? ?Charges: OT General Charges ?$OT Visit: 1 Visit ?OT Treatments ?$Therapeutic Exercise: 8-22 mins ? ?Nestor Lewandowsky, OTR/L ?Acute Rehabilitation Services ?Pager: [phone] ?Office: [phone]  ? ?Jeremiah [name] ?02/09/2022, 1:39 PM ?

## 2022-02-09 NOTE — Plan of Care (Signed)

## 2022-02-10 DIAGNOSIS — S41002A Unspecified open wound of left shoulder, initial encounter: Secondary | ICD-10-CM | POA: Diagnosis not present

## 2022-02-10 LAB — CBC
HCT: 26.9 % — ABNORMAL LOW (ref 39.0–52.0)
Hemoglobin: 8 g/dL — ABNORMAL LOW (ref 13.0–17.0)
MCH: 25.3 pg — ABNORMAL LOW (ref 26.0–34.0)
MCHC: 29.7 g/dL — ABNORMAL LOW (ref 30.0–36.0)
MCV: 85.1 fL (ref 80.0–100.0)
Platelets: 297 10*3/uL (ref 150–400)
RBC: 3.16 MIL/uL — ABNORMAL LOW (ref 4.22–5.81)
RDW: 17 % — ABNORMAL HIGH (ref 11.5–15.5)
WBC: 5.9 10*3/uL (ref 4.0–10.5)
nRBC: 0 % (ref 0.0–0.2)

## 2022-02-10 LAB — BASIC METABOLIC PANEL
Anion gap: 5 (ref 5–15)
BUN: 30 mg/dL — ABNORMAL HIGH (ref 8–23)
CO2: 28 mmol/L (ref 22–32)
Calcium: 8.6 mg/dL — ABNORMAL LOW (ref 8.9–10.3)
Chloride: 107 mmol/L (ref 98–111)
Creatinine, Ser: 0.92 mg/dL (ref 0.61–1.24)
GFR, Estimated: >60 mL/min (ref >60)
Glucose, Bld: 96 mg/dL (ref 70–99)
Potassium: 4 mmol/L (ref 3.5–5.1)
Sodium: 140 mmol/L (ref 135–145)

## 2022-02-10 LAB — MAGNESIUM: Magnesium: 1.8 mg/dL (ref 1.7–2.4)

## 2022-02-10 LAB — PHOSPHORUS: Phosphorus: 4.4 mg/dL (ref 2.5–4.6)

## 2022-02-10 NOTE — Progress Notes (Signed)
Physical Therapy Treatment ?Patient Details ?Name: Jeremiah [name] ?MRN: [id number] ?DOB: 08/13/1951 ?Today's Date: 02/10/2022 ? ? ?History of Present Illness Pt is a 71 y/o M presenting to ED on 2/25 wtih L shoulder wound with pain radiating to chest. Found to have L shoulder anteromedial dislocation after fall 3 months ago. S/p removal of reverse shoulder implant with antibiotic spacer on 2/28. PMH includes CAD s/p bypass, hx of PE and DVT on eliquis, HTN and alcohol abuse. ? ?  ?PT Comments  ? ? Pt with good maintenance of progress this session with ambulation. Pt needing min assist to manage lines throughout session as pt moving quickly and impulsively and with greatly decreased awareness of safety. Pt continues to benefit from skilled PT services to progress toward functional mobility goals.  ?  ?Recommendations for follow up therapy are one component of a multi-disciplinary discharge planning process, led by the attending physician.  Recommendations may be updated based on patient status, additional functional criteria and insurance authorization. ? ?Follow Up Recommendations ? Home health PT ?  ?  ?Assistance Recommended at Discharge Intermittent Supervision/Assistance  ?Patient can return home with the following A little help with walking and/or transfers;A little help with bathing/dressing/bathroom;Assist for transportation;Help with stairs or ramp for entrance ?  ?Equipment Recommendations ? Cane  ?  ?Recommendations for Other Services   ? ? ?  ?Precautions / Restrictions Precautions ?Precautions: Fall ?Precaution Comments: L shoulder precautions ?Required Braces or Orthoses: Sling ?Restrictions ?Weight Bearing Restrictions: Yes ?LUE Weight Bearing: Non weight bearing ?Other Position/Activity Restrictions: sling on at all times except for dressing/exercise, AROM of hand/wrist/elbow ok, no shoulder AROM/PROM per MD  ?  ? ?Mobility ? Bed Mobility ?  ?  ?  ?  ?  ?  ?  ?General bed mobility comments: pt in chair  on arrival ?  ? ?Transfers ?Overall transfer level: Needs assistance ?Equipment used: Straight cane ?Transfers: Sit to/from Stand ?Sit to Stand: Supervision ?  ?  ?  ?  ?  ?  ?  ? ?Ambulation/Gait ?Ambulation/Gait assistance: Min guard, Supervision (assist for IV pole management) ?Gait Distance (Feet): 520 Feet (with seated rest break x1) ?Assistive device: Straight cane ?Gait Pattern/deviations: Step-through pattern, Decreased step length - right, Decreased step length - left, Wide base of support, Decreased stride length, Decreased stance time - left ?Gait velocity: decreased ?  ?  ?General Gait Details: generalized unsteadiness upon standing, specifially when turning, wide base of support, can correct but states it feels unsteady, much slower this session seconadry to L ankle pain ? ? ?Stairs ?  ?  ?  ?  ?  ? ? ?Wheelchair Mobility ?  ? ?Modified Rankin (Stroke Patients Only) ?  ? ? ?  ?Balance Overall balance assessment: Needs assistance ?  ?Sitting balance-Leahy Scale: Good ?  ?  ?Standing balance support: Single extremity supported ?Standing balance-Leahy Scale: Fair ?  ?  ?  ?  ?  ?  ?  ?  ?  ?  ?  ?  ?  ? ?  ?Cognition Arousal/Alertness: Awake/alert ?Behavior During Therapy: Flat affect ?Overall Cognitive Status: Impaired/Different from baseline ?Area of Impairment: Problem solving ?  ?  ?  ?  ?  ?  ?  ?  ?  ?  ?  ?  ?  ?  ?Problem Solving: Decreased initiation, Requires verbal cues ?General Comments: pt perseverating on where he will go at d/c ?  ?  ? ?  ?Exercises   ? ?  ?  General Comments   ?  ?  ? ?Pertinent Vitals/Pain Pain Assessment ?Faces Pain Scale: Hurts a little bit ?Pain Location: L shoulder ?Pain Descriptors / Indicators: Sore  ? ? ?Home Living   ?  ?  ?  ?  ?  ?  ?  ?  ?  ?   ?  ?Prior Function    ?  ?  ?   ? ?PT Goals (current goals can now be found in the care plan section) Acute Rehab PT Goals ?PT Goal Formulation: With patient ?Time For Goal Achievement: 02/02/22 ? ?  ?Frequency ? ? ? Min  5X/week ? ? ? ?  ?PT Plan    ? ? ?Co-evaluation   ?  ?  ?  ?  ? ?  ?AM-PAC PT "6 Clicks" Mobility   ?Outcome Measure ? Help needed turning from your back to your side while in a flat bed without using bedrails?: None ?Help needed moving from lying on your back to sitting on the side of a flat bed without using bedrails?: A Little ?Help needed moving to and from a bed to a chair (including a wheelchair)?: A Little ?Help needed standing up from a chair using your arms (e.g., wheelchair or bedside chair)?: A Little ?Help needed to walk in hospital room?: A Little ?Help needed climbing 3-5 steps with a railing? : A Little ?6 Click Score: 19 ? ?  ?End of Session Equipment Utilized During Treatment: Gait belt ?Activity Tolerance: Patient limited by pain ?Patient left: in chair;with call bell/phone within reach;with nursing/sitter in room ?Nurse Communication: Mobility status ?PT Visit Diagnosis: Unsteadiness on feet (R26.81);Muscle weakness (generalized) (M62.81);Pain ?Pain - Right/Left: Left ?Pain - part of body: Arm;Shoulder ?  ? ? ?Time: 3810-1751 ?PT Time Calculation (min) (ACUTE ONLY): 14 min ? ?Charges:  $Therapeutic Exercise: 8-22 mins          ?          ? ?[name]. PTA ?Acute Rehabilitation Services ?Office: [phone] ? ? ? ?Betsey Holiday Jette Lewan ?02/10/2022, 4:45 PM ? ?

## 2022-02-10 NOTE — Progress Notes (Signed)
?PROGRESS NOTE ? ? ? ?Jeremiah [name]  VFI:[id number] DOB: [birth year] DOA: 01/15/2022 ? ?PCP: Carol Ada, MD ? ? ?Brief Narrative:  ?This 71 y.o. male with medical history significant of CAD s/p bypass in 1998, Hx of PE and DVT on Eliquis, HTN, alcohol abuse with hx of withdrawal, opioid dependency previously on Suboxone presented to the hospital with left shoulder and neck pain.  Reportedly patient lost his wife last year and moved out to [place] to live with his brother. However did not get along with his sister-in-law and has recently moved back here to [place] one week prior to presentation and noted to be homeless and living in [place]. Noted to have multiple admissions recently in [place] for alcohol withdrawal.  On 10/2021 he was admitted following a fall and was noted to have a large left-sided chest wall hematoma  had multiple I&D and wound VAC in January due to persistent refractory hematoma, found to have chronically dislocated left shoulder arthroplasty in the setting of likely infection.  On 02/06/2021-CTA showed large burden of thromboembolic disease with central pulmonary artery distribution.  Saw Pulm in office with Dr Janett Billow at [facility]- who noted 'it sounds like his pulmonary embolism was associated with some lower extremity cellulitis which he was hospitalized and minimally mobile. He was treated with Eliquis, now off.  In the ED on this admission patient was hemodynamically stable.  Labs showed some mild anemia of hemoglobin 9.1.  EKG showed atrial flutter.  Troponin was borderline.  Left shoulder x-ray showed anterior dislocation of left reverse total shoulder arthroplasty.  Patient had CTA chest on 01/16/2022 which showed no evidence of pulmonary embolism but pulmonary hypertension and cardiomegaly.  There was note of 4.7 cm thoracic aortic aneurysm.  Radiology recommended dysemia [place] CT or MRI follow-up and referral to cardiothoracic surgery as outpatient.  Patient  was also seen by orthopedics during hospitalization  and the  CT of left shoulder-showed postsurgical changes and dislocation, large periprosthetic fluid collection surrounding the proximal humerus and extending throughout the left axillary region up to 7.8 X4.4X 8.8-sequelae of postoperative seroma, hematoma versus particle disease.  Patient had 2D echocardiogram on 01/16/2022 which showed reduced LV function and cardiology was consulted.  On 01/17/2022 CT-guided biopsy of the left anterior axillary fluid was done with removal of 30 mils of cloudy fluid with.  Patient underwent removal of shoulder implant with excision of sinus tract antibiotic spacer placement and wound VAC placement by Dr. Marlou Sa on 01/18/2022.  On 01/19/2022, infectious disease was consulted who recommended prolonged IV antibiotic course. Patient has homelessness issues which has been a barrier for his disposition as well.  On 01/26/2022, patient was noted to be anemic and received 1 unit of packed RBC. Otherwise he has remained stable except for ongoing pain issues.  ? ?  ?Assessment & Plan: ? ?Left shoulder prosthetic joint infection: ?Left shoulder reverse total shoulder arthroplasty anterior dislocation, chronic with intermittent draining wound over left anterior shoulder:  ?-s/p CT-guided biopsy by IR on 01/17/2022 with removal of 30 mil of fluid; status post removal of hardware, irrigation/debridement and placement of antibiotic spacer with 6-8 weeks of IV antibiotics.   ?-Orthopedics and infectious disease followed the patient during hospitalization.  ?-Total duration of antibiotics is 6 weeks from 2/28 to 4/11. ? ?Acute on chronic combined systolic and diastolic CHF (congestive heart failure) ([place]) ?-Patient was supposed to be on Bumex which he ran out for several months.   ?-Continue CHF protocol.  Seen by  cardiology during this hospitalization.  ?-Continue lasix daily low dose.  Appears compensated at this time. ?-Continue strict input and  output.  Daily weights.  Fluid restriction.   ?-Negative balance of 16,545 cc since admission. ? ?ETOH Abuse: ?-No withdrawal symptoms at this time.   ?-Continue thiamine, folic acid. ?  ?Anemia of chronic disease/ Normocytic anemia ?-Hb 9 on 02/06/2022.  >8 on 02/10/22 ?-Monitor intermittently.  Continue vitamin B12.   ?  ?CAD s/p CABG x4 1998 ?Chronic trifascicular block ?-Continue aspirin and statins.  Hold AV nodal blockers.  ?  ?Left wrist pain > Resolved. ?Seen by orthopedics.   Uric acid of 4.4.  X-ray of the left wrist shows severe osteoarthritis.   ?Orthopedic recommended  3-day course of colchicine which he has completed. - ?His pain has improved at this time. ?   ?Back pain ?Chronic back pain, spinal stenosis.    ?-Patient does have high tolerance to pain.  Continue oxycodone every 4 hourly.   ?Continue Lidoderm patch, gabapentin 3 times daily and continue tizanidine. ?  ?Severe protein calorie malnutrition.   ?-Present on admission.  Nutrition on board.   ?Continue supplements.  Encourage oral nutrition. ?  ?Thoracic aortic aneurysm (TAA) ?-CTA incidentally showed a 4.7 cm thoracic aortic aneurysm,  ?-Needs  semiannual CT or MRI imaging/cardiothoracic surgery follow-up as outpatient ?  ?B12 deficiency ?-Continue vitamin B12 replacement. ?  ?History of pulmonary embolism ?-Completed treatment with Eliquis.   ?Not currently on anticoagulation due to risk of falls,  homelessness, history of hematoma, ongoing alcohol abuse. ?  ?Depression/anxiety: ?-Continue hydroxyzine, Xanax, Wellbutrin, buspirone. ?  ?Paroxysmal new onset atrial flutter ([place]) ?Asymptomatic bradycardia: Resolved ?-TSH was within normal limit.  ?- Patient did have pause for 2.90 seconds.   ?-Cardiology was consulted.  Avoid nodal blockers.  Rate controlled at this time. ?  ? ?DVT prophylaxis: Lovenox ?Code Status: Full code ?Family Communication: No family at bedside ?Disposition Plan: To be determined ? ?Patient is homeless, it is  challenging to find a place for him due to his ongoing medical issues requiring prolonged antibiotics.  TOC is aware and working on disposition.  He was given shelter information however patient has refused at this time. ? ?Consultants:  ?Orthopedic surgery ?Cardiology ? ?Procedures:  ?CT-guided aspiration on 01/17/2022 ?Total shoulder arthroplasty with antibiotic spacer placement on 01/18/2022 by Dr. Marlou Sa orthopedics  ? ?Antimicrobials:  ?Vancomycin ? ? ?Subjective: ?Patient was seen and examined at bedside.  Overnight events noted. ?He reports feeling better he was sitting comfortably on the chair watching television. ?He is more concerned about getting his driving license back. ? ?Objective: ?Vitals:  ? 02/09/22 1617 02/09/22 2058 02/10/22 0505 02/10/22 4944  ?BP: (!) 172/97 (!) 143/61 137/66 121/69  ?Pulse: 81 76 70 (!) 57  ?Resp: '18 18 18 18  '$ ?Temp: 98.1 ?F (36.7 ?C) 98.1 ?F (36.7 ?C) 97.8 ?F (36.6 ?C) 98.2 ?F (36.8 ?C)  ?TempSrc: Oral Oral Oral   ?SpO2: 100% 100% 100% 100%  ?Weight:   88.1 kg   ?Height:      ? ? ?Intake/Output Summary (Last 24 hours) at 02/10/2022 1344 ?Last data filed at 02/10/2022 0402 ?Gross per 24 hour  ?Intake 210 ml  ?Output 1345 ml  ?Net -1135 ml  ? ?Filed Weights  ? 02/08/22 0426 02/08/22 2313 02/10/22 0505  ?Weight: 88.5 kg 87.7 kg 88.1 kg  ? ? ?Examination: ? ?General exam: Chronically ill looking, not in any acute distress,  appears comfortable. ?Respiratory system: Decreased breath  sounds, no wheezing, normal respiratory effort. ?Cardiovascular system: S1-S2 heard, regular rate and rhythm, no murmur. ?gastrointestinal system: Abdomen is soft, mildly distended, non tender, BS+. ?extremities: Left shoulder is in a sling.  Mild lower extremity edema present. ? ?Data Reviewed: I have personally reviewed following labs and imaging studies ? ?CBC: ?Recent Labs  ?Lab 02/06/22 ?0409 02/10/22 ?0351  ?WBC 5.7 5.9  ?HGB 9.0* 8.0*  ?HCT 29.6* 26.9*  ?MCV 84.8 85.1  ?PLT 402* 297  ? ?Basic  Metabolic Panel: ?Recent Labs  ?Lab 02/06/22 ?0409 02/10/22 ?0351  ?NA 138 140  ?K 3.9 4.0  ?CL 104 107  ?CO2 26 28  ?GLUCOSE 194* 96  ?BUN 41* 30*  ?CREATININE 0.82 0.92  ?CALCIUM 8.7* 8.6*  ?MG  --  1.8  ?PHOS  --  4.4  ? ?

## 2022-02-10 NOTE — Progress Notes (Signed)
Mobility Specialist Progress Note: ? ? 02/10/22 1307  ?Mobility  ?Activity Ambulated with assistance in hallway  ?Level of Assistance Standby assist, set-up cues, supervision of patient - no hands on  ?Assistive Device [place]  ?LUE Weight Bearing NWB  ?Distance Ambulated (ft) 570 ft  ?Activity Response Tolerated well  ?$Mobility charge 1 Mobility  ? ?Pt received in bed willing to participate in mobility. Complaints of shoulder and lower back pain. Required 1 seated break. Left in chair with call bell in reach and all needs met.  ? ?Jeremiah [name] ?Mobility Specialist ?Primary Phone [phone] ? ?

## 2022-02-11 DIAGNOSIS — T8450XA Infection and inflammatory reaction due to unspecified internal joint prosthesis, initial encounter: Secondary | ICD-10-CM | POA: Diagnosis present

## 2022-02-11 DIAGNOSIS — I2510 Atherosclerotic heart disease of native coronary artery without angina pectoris: Secondary | ICD-10-CM

## 2022-02-11 DIAGNOSIS — F1010 Alcohol abuse, uncomplicated: Secondary | ICD-10-CM | POA: Diagnosis not present

## 2022-02-11 DIAGNOSIS — I2583 Coronary atherosclerosis due to lipid rich plaque: Secondary | ICD-10-CM

## 2022-02-11 DIAGNOSIS — Z96619 Presence of unspecified artificial shoulder joint: Secondary | ICD-10-CM | POA: Diagnosis present

## 2022-02-11 DIAGNOSIS — M545 Low back pain, unspecified: Secondary | ICD-10-CM | POA: Diagnosis not present

## 2022-02-11 MED ORDER — SODIUM CHLORIDE 0.9 % IV SOLN
125.0000 mg | Freq: Every day | INTRAVENOUS | Status: AC
  Administered 2022-02-11 – 2022-02-14 (×4): 125 mg via INTRAVENOUS
  Filled 2022-02-11 (×4): qty 10

## 2022-02-11 NOTE — Progress Notes (Signed)
?Progress Note ? ? ?Patient: Jeremiah [name] BOF:[id number] DOB: 15-Dec-1950 DOA: 01/15/2022     27 ?DOS: the patient was seen and examined on 02/11/2022 ?  ?Brief hospital course: ?See previous summary by Dr. Starla Link ?71 y.o. M with CAD s/p CABG 98, PE on Eliquis, HTN, opiate dependence who presented with shoulder pain.  ?Had had a fall and hematoma in the left chest in [place], that ultimately must have gotten infected because he got a debridement and wound vac.  In the context of that treatment was found to have a chronically dislocated shoulder, so someone in [place] replaced it.   ? ?Unfortunately, the shoulder pain he presented with here was a MRSA infection that had entered the prosthetic joint. ? ?Hospital stay complicated by homelessness. ? ? ? ? ? ? ?Assessment and Plan: ?* Prosthetic joint infection ([place]) ?Presented with left shoulder wound.  ?Origin of this wound is a misadventure in [place]. ?S/p left shoulder aspiration by IR on 2/27.  Aspirate cultures MRSA. ?S/p left shoulder excision of sinus tract, removal of reverse shoulder replacement, antibiotic spacer placement and placement of wound VAC.  OR cultures no growth.  ?  ?- Continue Vancomycin until 4/11, then PO per ID ? ? ? ?Chronic combined systolic and diastolic CHF (congestive heart failure) ([place]) ?Moderate to severe aortic regurgitation on prior echo now mild ?Aortic root 4.27 cm ?Euvolemic ?-Continue furoemide ?  ? ?Iron deficiency anemia due to chronic blood loss ?Iron indices low.  Hgb stable. ?- Start IV iron ? ?CAD (coronary artery disease) ?CAD s/p cabg x4 1998 ?Chronic trifascicular block ?Hyperlipidemia ?- Avoid AV nodal blockade ?- Continue aspirin and Crestor ? ?Chronic back pain, spinal stenosis ?-Continue oxycodone, tizanidine, gabapentin, Wellbutrin ? ? ?Protein-calorie malnutrition, severe ?Severe protein calorie malnutrition.   ?  ? ?Thoracic aortic aneurysm (TAA) ?CTA incidentally showed a 4.7 cm thoracic aortic  aneurysm, will need semiannual CT or MRI imaging/cardiothoracic surgery follow-up as outpatient ?  ?B12 deficiency ?Continue vitamin B12 replacement. ? ?History of pulmonary embolism ?Completed treatment with Eliquis.  Not currently on anticoagulation due to risk of falls homelessness, history of hematoma, ongoing alcohol abuse. ? ?Anxiety ?- Continue Xanax, hydroxyzine, Wellbutrin, buspirone. ? ? ? ? ? ? ?  ? ?Subjective: Complains of some shoulder pain, low back pain.  No fever, confusion, respiratory distress.  Good appetite. ? ?Physical Exam: ?Vitals:  ? 02/10/22 2027 02/11/22 0155 02/11/22 0500 02/11/22 0820  ?BP: (!) 128/50 (!) 128/51  (!) 140/58  ?Pulse: 73 68  72  ?Resp: '17 16  16  '$ ?Temp: 97.8 ?F (36.6 ?C) 97.8 ?F (36.6 ?C)  99.2 ?F (37.3 ?C)  ?TempSrc: Oral Oral  Oral  ?SpO2: 100% 98%  97%  ?Weight:   88 kg   ?Height:      ? ?Elderly adult male, appears disheveled and poorly groomed, lying in bed, eating breakfast ?RRR, no murmurs, no peripheral edema ?Left arm is in a sling ?Respiratory rate normal, lungs clear without rales or wheezes ?Attention normal, affect appropriate, judgment insight appear normal ? ? ? ? ? ?Data Reviewed: ?Prior notes reviewed, nursing notes reviewed, vital signs reviewed. ?Hemoglobin yesterday was 8, stable from previous ? ?Family Communication:  ? ?Disposition: ?Status is: Inpatient ?Remains inpatient appropriate because:  ?Patient requires prolonged course of IV antibiotics, however he has a history of homelessness, alcohol abuse and has been seen twice in the emergency room within the month prior to admission for alcohol abuse, altered mental status, and head injury  and so could be unsafe to discharge him on oral antibiotics at this time ? ? ? ? ? ? ? ? Planned Discharge Destination:  Unknown ? ? ? ?  ? ?Author: ?Edwin Dada, MD ?02/11/2022 1:32 PM ? ?For on call review [email or web address].  ?

## 2022-02-11 NOTE — Progress Notes (Signed)
Mobility Specialist Progress Note: ? ? 02/11/22 1142  ?Mobility  ?Activity Ambulated with assistance in hallway  ?Level of Assistance Independent after set-up  ?Assistive Device [place]  ?LUE Weight Bearing NWB  ?Distance Ambulated (ft) 570 ft  ?Activity Response Tolerated well  ?$Mobility charge 1 Mobility  ? ?Pt received coming out of bathroom willing to participate in mobility. Complaints of 7/10 shoulder and back pain. Required 1 seated break. Left in chair with call bell in reach and all needs met. ? ?Jeremiah [name] ?Mobility Specialist ?Primary Phone [phone] ? ?

## 2022-02-11 NOTE — Assessment & Plan Note (Addendum)
Presented with left shoulder wound.  ?Origin of this wound is a misadventure in [place] in which someone attempted to drain a hematoma. ? ?S/p left shoulder aspiration by IR on 2/27. ?Or cultures MRSA ?S/p left shoulder excision of sinus tract, removal of reverse shoulder replacement, antibiotic spacer placement and placement of wound VAC. ?Or cultures no growth with no organisms in gram stain ( final ).  ?? ?2. Left sided chest wall hematoma s/p multiple I and Ds ( MRSA) at [place] - stable  ?3. Alcohol abuse ?4. H/o Opioid dependence ?5. Homelessness ?? ??He has h/o alcohol abuse and  twice seen in the ED in February ( [place]) for head injury and alcohol abuse and ams. It would be unsafe for him to discharge on PO antibiotics at this point given he does not have a stable housing and this is a Prosthetic joint infection.  ? ?Vancomycin until 4/11, then PO per ID ?Tolerating well antibiotic therapy. ?Continue pain control with oxycodone, gabapentin, ibuprofen  ?

## 2022-02-11 NOTE — Progress Notes (Signed)
Occupational Therapy Treatment ?Patient Details ?Name: Jeremiah [name] ?MRN: [id number] ?DOB: 09-03-1951 ?Today's Date: 02/11/2022 ? ? ?History of present illness Pt is a 71 y/o M presenting to ED on 2/25 wtih L shoulder wound with pain radiating to chest. Found to have L shoulder anteromedial dislocation after fall 3 months ago. S/p removal of reverse shoulder implant with antibiotic spacer on 2/28. PMH includes CAD s/p bypass, hx of PE and DVT on eliquis, HTN and alcohol abuse. ?  ?OT comments ? Pt completed grooming x 10 minutes standing at sink. Doffed sling and performed AROM L elbow to hand x 10 reps in standing, redonned sling. Pt ambulating with supervision with cane and assist for IV pole.   ? ?Recommendations for follow up therapy are one component of a multi-disciplinary discharge planning process, led by the attending physician.  Recommendations may be updated based on patient status, additional functional criteria and insurance authorization. ?   ?Follow Up Recommendations ? Home health OT  ?  ?Assistance Recommended at Discharge Intermittent Supervision/Assistance  ?Patient can return home with the following ? A little help with walking and/or transfers;Assistance with cooking/housework;Direct supervision/assist for financial management;Assist for transportation;Help with stairs or ramp for entrance;A lot of help with bathing/dressing/bathroom ?  ?Equipment Recommendations ? None recommended by OT  ?  ?Recommendations for Other Services   ? ?  ?Precautions / Restrictions Precautions ?Precautions: Fall ?Precaution Comments: L shoulder precautions ?Required Braces or Orthoses: Sling ?Restrictions ?Weight Bearing Restrictions: Yes ?LUE Weight Bearing: Non weight bearing ?Other Position/Activity Restrictions: sling on at all times except for dressing/exercise, AROM of hand/wrist/elbow ok, no shoulder AROM/PROM per MD  ? ? ?  ? ?Mobility Bed Mobility ?  ?  ?  ?  ?  ?  ?  ?General bed mobility comments: pt in  chair on arrival ?  ? ?Transfers ?Overall transfer level: Modified independent ?Equipment used: Straight cane ?  ?  ?  ?  ?  ?  ?  ?General transfer comment: no difficulty or LOB ?  ?  ?Balance Overall balance assessment: Needs assistance ?  ?Sitting balance-Leahy Scale: Good ?  ?  ?  ?Standing balance-Leahy Scale: Fair ?  ?  ?  ?  ?  ?  ?  ?  ?  ?  ?  ?  ?   ? ?ADL either performed or assessed with clinical judgement  ? ?ADL Overall ADL's : Needs assistance/impaired ?  ?  ?Grooming: Standing;Supervision/safety;Wash/dry face;Wash/dry hands (shaved) ?  ?  ?  ?  ?  ?  ?  ?  ?  ?  ?  ?  ?  ?  ?  ?Functional mobility during ADLs: Supervision/safety;Cane ?General ADL Comments: pt needing total assist for sling ?  ? ?Extremity/Trunk Assessment   ?  ?  ?  ?  ?  ? ?Vision   ?  ?  ?Perception   ?  ?Praxis   ?  ? ?Cognition Arousal/Alertness: Awake/alert ?Behavior During Therapy: Flat affect ?Overall Cognitive Status: Impaired/Different from baseline ?Area of Impairment: Problem solving ?  ?  ?  ?  ?  ?  ?  ?  ?  ?  ?  ?  ?  ?  ?  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  ?  ?   ?Exercises General Exercises - Upper Extremity ?Elbow Flexion: Left, 10 reps, Standing, AROM ?Elbow Extension: AROM, Left, 10 reps, Standing ?  Wrist Flexion: AROM, Left, Seated, 10 reps ?Wrist Extension: AROM, Left, 10 reps, Seated ?Digit Composite Flexion: AROM, Left, 10 reps, Seated ?Composite Extension: AROM, Left, 10 reps, Seated ? ?  ?Shoulder Instructions   ? ? ?  ?General Comments    ? ? ?Pertinent Vitals/ Pain       Pain Assessment ?Pain Assessment: No/denies pain ? ?Home Living   ?  ?  ?  ?  ?  ?  ?  ?  ?  ?  ?  ?  ?  ?  ?  ?  ?  ?  ? ?  ?Prior Functioning/Environment    ?  ?  ?  ?   ? ?Frequency ? Min 2X/week  ? ? ? ? ?  ?Progress Toward Goals ? ?OT Goals(current goals can now be found in the care plan section) ? Progress towards OT goals: Progressing toward  goals ? ?Acute Rehab OT Goals ?OT Goal Formulation: With patient ?Time For Goal Achievement: 02/15/22 ?Potential to Achieve Goals: Good  ?Plan Discharge plan remains appropriate   ? ?Co-evaluation ? ? ?   ?  ?  ?  ?  ? ?  ?AM-PAC OT "6 Clicks" Daily Activity     ?Outcome Measure ? ? Help from another person eating meals?: None ?Help from another person taking care of personal grooming?: A Little ?Help from another person toileting, which includes using toliet, bedpan, or urinal?: A Little ?Help from another person bathing (including washing, rinsing, drying)?: A Lot ?Help from another person to put on and taking off regular upper body clothing?: A Little ?Help from another person to put on and taking off regular lower body clothing?: A Lot ?6 Click Score: 17 ? ?  ?End of Session Equipment Utilized During Treatment: Other (comment) (cane, sling) ? ?OT Visit Diagnosis: Unsteadiness on feet (R26.81);Other abnormalities of gait and mobility (R26.89);Muscle weakness (generalized) (M62.81);Pain ?  ?Activity Tolerance Patient tolerated treatment well ?  ?Patient Left in chair;with call bell/phone within reach ?  ?Nurse Communication   ?  ? ?   ? ?Time: 9528-4132 ?OT Time Calculation (min): 28 min ? ?Charges: OT General Charges ?$OT Visit: 1 Visit ?OT Treatments ?$Self Care/Home Management : 8-22 mins ?$Therapeutic Exercise: 8-22 mins ? ?Nestor Lewandowsky, OTR/L ?Acute Rehabilitation Services ?Pager: [phone] ?Office: [phone]  ? ?Malka So ?02/11/2022, 10:38 AM ?

## 2022-02-11 NOTE — Progress Notes (Signed)
Physical Therapy Treatment ?Patient Details ?Name: Jeremiah [name] ?MRN: [id number] ?DOB: 1951-01-09 ?Today's Date: 02/11/2022 ? ? ?History of Present Illness Pt is a 71 y/o M presenting to ED on 2/25 wtih L shoulder wound with pain radiating to chest. Found to have L shoulder anteromedial dislocation after fall 3 months ago. S/p removal of reverse shoulder implant with antibiotic spacer on 2/28. PMH includes CAD s/p bypass, hx of PE and DVT on eliquis, HTN and alcohol abuse. ? ?  ?PT Comments  ? ? Patient received in recliner. He is agreeable to PT session. Patient stands from recliner without assist. Ambulated 500 feet with occasional use of SPC ( otherwise carries it). No lob, no assistance needed. No cues needed during mobility. He appears to be at baseline level of mobility. Can helpful as needed for chronic L LE pain due to accident many years ago. He does not require continued skilled PT at this time and will benefit from continued ambulation via mobility specialist.  ?    ?Recommendations for follow up therapy are one component of a multi-disciplinary discharge planning process, led by the attending physician.  Recommendations may be updated based on patient status, additional functional criteria and insurance authorization. ? ?Follow Up Recommendations ? No PT follow up ?  ?  ?Assistance Recommended at Discharge None  ?Patient can return home with the following Assistance with cooking/housework;Assist for transportation ?  ?Equipment Recommendations ? Cane  ?  ?Recommendations for Other Services   ? ? ?  ?Precautions / Restrictions Precautions ?Precaution Comments: L shoulder precautions, mod fall ?Required Braces or Orthoses: Sling ?Restrictions ?Weight Bearing Restrictions: Yes ?LUE Weight Bearing: Non weight bearing ?Other Position/Activity Restrictions: sling on at all times except for dressing/exercise, AROM of hand/wrist/elbow ok, no shoulder AROM/PROM per MD  ?  ? ?Mobility ? Bed Mobility ?  ?  ?  ?  ?   ?  ?  ?General bed mobility comments: pt in chair on arrival ?  ? ?Transfers ?Overall transfer level: Independent ?Equipment used: None ?Transfers: Sit to/from Stand ?Sit to Stand: Independent ?  ?  ?  ?  ?  ?  ?  ? ?Ambulation/Gait ?Ambulation/Gait assistance: Modified independent (Device/Increase time) ?Gait Distance (Feet): 500 Feet ?Assistive device: Straight cane ?Gait Pattern/deviations: Step-through pattern, Decreased step length - right, Decreased step length - left, Wide base of support ?Gait velocity: WFL ?  ?  ?General Gait Details: patient without lob, uses SPC occasionally, carries it some of the time. One seated rest break due to fatigue. No physical assist needed with ambulation. Has mild L foot/ankle pain which is chronic. ? ? ?Stairs ?  ?  ?  ?  ?  ? ? ?Wheelchair Mobility ?  ? ?Modified Rankin (Stroke Patients Only) ?  ? ? ?  ?Balance Overall balance assessment: Modified Independent ?Sitting-balance support: Feet supported ?Sitting balance-Leahy Scale: Normal ?  ?  ?Standing balance support: Single extremity supported, During functional activity ?Standing balance-Leahy Scale: Good ?Standing balance comment: supervision with ambulation with and without SPC ?  ?  ?  ?  ?  ?  ?  ?  ?  ?  ?  ?  ? ?  ?Cognition Arousal/Alertness: Awake/alert ?Behavior During Therapy: [facility] for tasks assessed/performed ?Overall Cognitive Status: Within Functional Limits for tasks assessed ?  ?  ?  ?  ?  ?  ?  ?  ?  ?  ?  ?  ?  ?  ?  ?  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  ?  ? ?  ?Exercises   ? ?  ?General Comments   ?  ?  ? ?Pertinent Vitals/Pain Pain Assessment ?Pain Assessment: No/denies pain  ? ? ?Home Living   ?  ?  ?  ?  ?  ?  ?  ?  ?  ?   ?  ?Prior Function    ?  ?  ?   ? ?PT Goals (current goals can now be found in the care plan section) Acute Rehab PT Goals ?Patient Stated Goal: to figure out where he is going when he leaves the  hospital ?PT Goal Formulation: With patient ?Time For Goal Achievement: 02/25/22 ?Progress towards PT goals: Progressing toward goals ? ?  ?Frequency ? ? ?   ? ? ? ?  ?PT Plan Frequency needs to be updated;Discharge plan needs to be updated  ? ? ?Co-evaluation   ?  ?  ?  ?  ? ?  ?AM-PAC PT "6 Clicks" Mobility   ?Outcome Measure ? Help needed turning from your back to your side while in a flat bed without using bedrails?: None ?Help needed moving from lying on your back to sitting on the side of a flat bed without using bedrails?: None ?Help needed moving to and from a bed to a chair (including a wheelchair)?: None ?Help needed standing up from a chair using your arms (e.g., wheelchair or bedside chair)?: None ?Help needed to walk in hospital room?: None ?Help needed climbing 3-5 steps with a railing? : A Little ?6 Click Score: 23 ? ?  ?End of Session   ?Activity Tolerance: Patient tolerated treatment well ?Patient left: in chair;with call bell/phone within reach ?Nurse Communication: Mobility status ?Pain - Right/Left: Left ?Pain - part of body: Ankle and joints of foot ?  ? ? ?Time: 1610-9604 ?PT Time Calculation (min) (ACUTE ONLY): 20 min ? ?Charges:  $Gait Training: 8-22 mins          ?          ? ?Amanda Cockayne, PT, GCS ?02/11/22,2:07 PM ? ?

## 2022-02-12 DIAGNOSIS — F1010 Alcohol abuse, uncomplicated: Secondary | ICD-10-CM | POA: Diagnosis not present

## 2022-02-12 DIAGNOSIS — M545 Low back pain, unspecified: Secondary | ICD-10-CM | POA: Diagnosis not present

## 2022-02-12 DIAGNOSIS — I2510 Atherosclerotic heart disease of native coronary artery without angina pectoris: Secondary | ICD-10-CM | POA: Diagnosis not present

## 2022-02-12 DIAGNOSIS — T8450XA Infection and inflammatory reaction due to unspecified internal joint prosthesis, initial encounter: Secondary | ICD-10-CM | POA: Diagnosis not present

## 2022-02-12 MED ORDER — CLONAZEPAM 0.5 MG PO TABS
1.0000 mg | ORAL_TABLET | Freq: Two times a day (BID) | ORAL | Status: DC
  Administered 2022-02-12 – 2022-02-14 (×4): 1 mg via ORAL
  Filled 2022-02-12 (×4): qty 2

## 2022-02-12 NOTE — Progress Notes (Signed)
?Progress Note ? ? ?Patient: Jeremiah [name] KZL:[id number] DOB: 04-11-1951 DOA: 01/15/2022     28 ?DOS: the patient was seen and examined on 02/12/2022 ?  ?Brief hospital course: ?See previous summary by Dr. Starla Link ?71 y.o. M with CAD s/p CABG 98, PE on Eliquis, HTN, opiate dependence who presented with shoulder pain.  ?Had had a fall and hematoma in the left chest in [place], that ultimately must have gotten infected because he got a debridement and wound vac.  In the context of that treatment was found to have a chronically dislocated shoulder, so someone in [place] replaced it.   ? ?Unfortunately, the shoulder pain he presented with here was a MRSA infection that had entered the prosthetic joint. ? ?Hospital stay complicated by homelessness. ? ? ? ? ? ? ?Assessment and Plan: ?*Prosthetic joint infection ([place]) ?Presented with left shoulder wound.  ?Origin of this wound is a misadventure in [place]. ?S/p left shoulder aspiration by IR on 2/27.  Aspirate cultures MRSA. ?S/p left shoulder excision of sinus tract, removal of reverse shoulder replacement, antibiotic spacer placement and placement of wound VAC.  OR cultures no growth.  ?  ?- Continue vancomycin until 4/11, then PO per ID ? ? ? ?Chronic combined systolic and diastolic CHF (congestive heart failure) ([place]) ?Moderate to severe aortic regurgitation on prior echo now mild ?Aortic root 4.27 cm ?Euvolemic ?- Continue furosemide ?  ? ?Iron deficiency anemia due to chronic blood loss ?Iron indices low.  Hgb stable. ?- Continue IV iron ? ?CAD (coronary artery disease) ?CAD s/p cabg x4 1998 ?Chronic trifascicular block ?Hyperlipidemia ?- Avoid AV nodal blockade ?- Continue aspirin and Crestor ? ?Chronic back pain, spinal stenosis ?Discussed plans for oxycodone after discharge with patient today.  He does not have a local pain clinic, does not know how he would obtain a daily chronic opiate.  In light of that, we discussed tapering now, abrupt cessation at  discharge, and Suboxone.  He will consider these options. ?- Continue oxycodone, tizanidine, gabapentin, Wellbutrin ? ?  ? ?Anxiety ?- Continue hydroxyzine, Wellbutrin, buspirone. ?- Stop Xanax, start clonazepam taper ? ? ? ? ? ? ?  ? ?Subjective: No new fever, confusion, respiratory distress.  Has chronic pain in his shoulder, back, feet ? ?Physical Exam: ?Vitals:  ? 02/11/22 2013 02/12/22 0348 02/12/22 0742 02/12/22 1525  ?BP: (!) 128/57 133/72 (!) 146/64 (!) 153/66  ?Pulse: 95 73 73 79  ?Resp: '18 18 17 17  '$ ?Temp: 98 ?F (36.7 ?C) 97.7 ?F (36.5 ?C) 98 ?F (36.7 ?C) 97.6 ?F (36.4 ?C)  ?TempSrc: Oral Oral Oral Oral  ?SpO2: 100% 100% 100% 97%  ?Weight:      ?Height:      ? ?Elderly adult male, sitting up in recliner, watching television ?RRR, no murmurs, no peripheral edema ?Left arm is in a sling ?Respiratory rate normal, lungs clear without rales or wheezes ?Attention normal, affect appropriate, judgment insight appear normal ? ? ? ? ? ?Data Reviewed: ?Prior notes reviewed, nursing notes reviewed, vital signs reviewed. ?No new labs ? ?Family Communication:  ? ?Disposition: ?Status is: Inpatient ?Remains inpatient appropriate because:  ?Patient requires prolonged course of IV antibiotics, however he has a history of homelessness, alcohol abuse and has been seen twice in the emergency room within the month prior to admission for alcohol abuse, altered mental status, and head injury and so could be unsafe to discharge him on oral antibiotics at this time ? ? ? ? ? ? ? ?  Planned Discharge Destination:  Unknown ? ? ? ?  ? ?Author: ?Edwin Dada, MD ?02/12/2022 4:37 PM ? ?For on call review [email or web address].  ?

## 2022-02-12 NOTE — Progress Notes (Signed)
Occupational Therapy Treatment ?Patient Details ?Name: Jeremiah [name] ?MRN: [id number] ?DOB: Apr 09, 1951 ?Today's Date: 02/12/2022 ? ? ?History of present illness Pt is a 71 y/o M presenting to ED on 2/25 wtih L shoulder wound with pain radiating to chest. Found to have L shoulder anteromedial dislocation after fall 3 months ago. S/p removal of reverse shoulder implant with antibiotic spacer on 2/28. PMH includes CAD s/p bypass, hx of PE and DVT on eliquis, HTN and alcohol abuse. ?  ?OT comments ? Pt progressing towards goals, able to complete LUE HEP with minimal cuing, adheres to precautions throughout session. Pt needing AAROM for supination and elbow extension, reporting minimal pain that feels more "like a stretch".  Pt able to complete toileting and standing grooming task at sink with supervision/mod I, pt able to walk hallway distance with rest break x1, with supervision using SPC. Pt presenting with impairments listed below, will follow acutely. Continue to recommend HHOT at d/c.  ? ?Recommendations for follow up therapy are one component of a multi-disciplinary discharge planning process, led by the attending physician.  Recommendations may be updated based on patient status, additional functional criteria and insurance authorization. ?   ?Follow Up Recommendations ? Home health OT  ?  ?Assistance Recommended at Discharge Intermittent Supervision/Assistance  ?Patient can return home with the following ? A little help with walking and/or transfers;Assistance with cooking/housework;Direct supervision/assist for financial management;Assist for transportation;Help with stairs or ramp for entrance;A lot of help with bathing/dressing/bathroom ?  ?Equipment Recommendations ? None recommended by OT  ?  ?Recommendations for Other Services   ? ?  ?Precautions / Restrictions Precautions ?Precautions: Fall ?Precaution Comments: L shoulder precautions ?Required Braces or Orthoses: Sling ?Restrictions ?Weight Bearing  Restrictions: Yes ?LUE Weight Bearing: Non weight bearing ?Other Position/Activity Restrictions: sling on at all times except for dressing/exercise, AROM of hand/wrist/elbow ok, no shoulder AROM/PROM per MD  ? ? ?  ? ?Mobility Bed Mobility ?  ?  ?  ?  ?  ?  ?  ?General bed mobility comments: pt in chair on arrival ?  ? ?Transfers ?Overall transfer level: Independent ?Equipment used: Straight cane ?Transfers: Sit to/from Stand ?Sit to Stand: Independent ?  ?  ?  ?  ?  ?General transfer comment: no difficulty or LOB ?  ?  ?Balance Overall balance assessment: Modified Independent ?Sitting-balance support: Feet supported ?Sitting balance-Leahy Scale: Normal ?  ?  ?Standing balance support: Single extremity supported, During functional activity ?Standing balance-Leahy Scale: Good ?  ?  ?  ?  ?  ?  ?  ?  ?  ?  ?  ?  ?   ? ?ADL either performed or assessed with clinical judgement  ? ?ADL Overall ADL's : Needs assistance/impaired ?  ?  ?Grooming: Wash/dry hands;Standing;Supervision/safety ?Grooming Details (indicate cue type and reason): completed standing at sink ?  ?  ?  ?  ?  ?  ?  ?  ?Toilet Transfer: Ambulation;Regular Toilet;Min guard ?Toilet Transfer Details (indicate cue type and reason): with cane, min guard for IV pole ?Toileting- Clothing Manipulation and Hygiene: Modified independent;Sitting/lateral lean ?Toileting - Clothing Manipulation Details (indicate cue type and reason): pericare on commode ?  ?  ?Functional mobility during ADLs: Supervision/safety;Cane ?  ?  ? ?Extremity/Trunk Assessment Upper Extremity Assessment ?Upper Extremity Assessment: LUE deficits/detail ?LUE Deficits / Details: AROM of hand/wrist WFL, elbow PROM WFL, minimal AROM of elbow, unable to assess shoulder, immobilized ?LUE: Unable to fully assess due to immobilization ?  ?  Lower Extremity Assessment ?Lower Extremity Assessment: Defer to PT evaluation ?  ?  ?  ? ?Vision   ?Vision Assessment?: No apparent visual deficits ?  ?Perception  Perception ?Perception: Not tested ?  ?Praxis Praxis ?Praxis: Not tested ?  ? ?Cognition Arousal/Alertness: Awake/alert ?Behavior During Therapy: [facility] for tasks assessed/performed ?Overall Cognitive Status: Within Functional Limits for tasks assessed ?Area of Impairment: Problem solving ?  ?  ?  ?  ?  ?  ?  ?  ?  ?  ?  ?  ?  ?  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  ?  ?   ?Exercises General Exercises - Upper Extremity ?Elbow Flexion: Left, 10 reps, Standing, AROM ?Elbow Extension: AROM, Left, 10 reps, Standing ?Wrist Flexion: AROM, Left, Seated, 10 reps ?Wrist Extension: AROM, Left, 10 reps, Seated ?Digit Composite Flexion: AROM, Left, 10 reps, Seated ?Composite Extension: AROM, Left, 10 reps, Seated ?Other Exercises ?Other Exercises: pronation/supination x10 LUE ? ?  ?Shoulder Instructions   ? ? ?  ?General Comments    ? ? ?Pertinent Vitals/ Pain       Pain Assessment ?Pain Assessment: No/denies pain ?Pain Score: 6  ?Faces Pain Scale: Hurts even more ?Pain Location: L shoulder ?Pain Descriptors / Indicators: Sore ?Pain Intervention(s): Limited activity within patient's tolerance, Monitored during session, Repositioned ? ?Home Living   ?  ?  ?  ?  ?  ?  ?  ?  ?  ?  ?  ?  ?  ?  ?  ?  ?  ?  ? ?  ?Prior Functioning/Environment    ?  ?  ?  ?   ? ?Frequency ? Min 2X/week  ? ? ? ? ?  ?Progress Toward Goals ? ?OT Goals(current goals can now be found in the care plan section) ? Progress towards OT goals: Progressing toward goals ? ?Acute Rehab OT Goals ?Patient Stated Goal: to get better ?OT Goal Formulation: With patient ?Time For Goal Achievement: 02/15/22 ?Potential to Achieve Goals: Good ?ADL Goals ?Pt Will Perform Grooming: Independently;standing ?Pt Will Perform Upper Body Dressing: with min assist;sitting;standing ?Pt Will Perform Lower Body Dressing: with min assist;sitting/lateral  leans;sit to/from stand  ?Plan Discharge plan remains appropriate   ? ?Co-evaluation ? ? ?   ?  ?  ?  ?  ? ?  ?AM-PAC OT "6 Clicks" Daily Activity     ?Outcome Measure ? ? Help from another person eating meals?: None ?Help from another person taking care of personal grooming?: A Little ?Help from another person toileting, which includes using toliet, bedpan, or urinal?: A Little ?Help from another person bathing (including washing, rinsing, drying)?: A Lot ?Help from another person to put on and taking off regular upper body clothing?: A Lot ?Help from another person to put on and taking off regular lower body clothing?: A Lot ?6 Click Score: 16 ? ?  ?End of Session Equipment Utilized During Treatment: Other (comment) (cane, sling) ? ?OT Visit Diagnosis: Unsteadiness on feet (R26.81);Other abnormalities of gait and mobility (R26.89);Muscle weakness (generalized) (M62.81);Pain ?  ?Activity Tolerance Patient tolerated treatment well ?  ?Patient Left in chair;with call bell/phone within reach;with chair alarm set ?  ?Nurse Communication Mobility status ?  ? ?   ? ?Time: 8916-9450 ?OT Time Calculation (min): 33 min ? ?Charges: OT General Charges ?$OT Visit: 1 Visit ?OT Treatments ?$Self Care/Home  Management : 8-22 mins ?$Therapeutic Activity: 8-22 mins ? ?Lynnda Child, OTD, OTR/L ?Acute Rehab ?[phone] ? ? ?Kaylyn Lim ?02/12/2022, 10:44 AM ?

## 2022-02-12 NOTE — Progress Notes (Signed)
Mobility Specialist: Progress Note ? ? 02/12/22 1211  ?Mobility  ?Activity Ambulated with assistance in hallway  ?Level of Assistance Modified independent, requires aide device or extra time  ?Assistive Device [place]  ?LUE Weight Bearing NWB  ?Distance Ambulated (ft) 570 ft  ?Activity Response Tolerated well  ?$Mobility charge 1 Mobility  ? ?Received pt in chair having no complaints and agreeable to mobility. Ambulated intermittently with the cane throughout session. Asymptomatic throughout ambulation, returned back to chair w/ call bell in reach and all needs met. ? ?Jeremiah [name] ?Mobility Specialist ?Mobility Specialist [place]: [phone] ?Mobility Specialist [place]: [phone] ? ?

## 2022-02-13 DIAGNOSIS — T8450XA Infection and inflammatory reaction due to unspecified internal joint prosthesis, initial encounter: Secondary | ICD-10-CM | POA: Diagnosis not present

## 2022-02-13 DIAGNOSIS — I2510 Atherosclerotic heart disease of native coronary artery without angina pectoris: Secondary | ICD-10-CM | POA: Diagnosis not present

## 2022-02-13 DIAGNOSIS — M545 Low back pain, unspecified: Secondary | ICD-10-CM | POA: Diagnosis not present

## 2022-02-13 DIAGNOSIS — F1010 Alcohol abuse, uncomplicated: Secondary | ICD-10-CM | POA: Diagnosis not present

## 2022-02-13 NOTE — Progress Notes (Signed)
?  Progress Note ? ? ?Patient: Jeremiah [name] [id number] DOB: 07/28/1951 DOA: 01/15/2022     29 ?DOS: the patient was seen and examined on 02/13/2022 ?  ?Brief hospital course: ?See previous summary by Dr. Starla Link ?71 y.o. M with CAD s/p CABG 98, PE on Eliquis, HTN, opiate dependence who presented with shoulder pain.  ? ?Had had a fall and hematoma in the left chest in [place], that ultimately must have gotten infected because he got a debridement and wound vac.  In the context of that treatment was found to have a chronically dislocated shoulder, so someone in [place] replaced it.   ? ?Unfortunately, the shoulder pain he presented with here was a MRSA infection that had entered the prosthetic joint. ? ?Hospital stay complicated by homelessness. ? ? ? ? ? ? ?Assessment and Plan: ?*Prosthetic joint infection ?-Continue vancomycin until 4/11, then PO per ID ? ? ? 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  ? ?Iron deficiency anemia due to chronic blood loss ?Iron indices low.  Hgb stable. ?-Continue IV iron ? ?CAD (coronary artery disease) ?CAD s/p cabg x4 1998 ?Chronic trifascicular block ?Hyperlipidemia ?- Avoid AV nodal blockade ?-Continue aspirin and Crestor ? ?Chronic back pain, spinal stenosis ?Discussed plans for oxycodone after discharge with patient today again.  He does not have a local pain clinic, does not know how he would obtain a daily chronic opiate.   ? ?We again discussed taper versus suboxone.   ?- Continue tizanidine, gabapentin, Wellbutrin ?- I will start an oxycodone taper tomorrow ? ?  ? ?Anxiety ?- Continue hydroxyzine, Wellbutrin, Buspar (buspar and wellbutrin at max doses) ?- Continue clonazepam taper ? ? ? ? ? ? ?  ? ?Subjective: No new swelling, no fever, change in chronic pain ? ?Physical Exam: ?Vitals:  ?  02/13/22 0500 02/13/22 0607 02/13/22 0811 02/13/22 1524  ?BP:  (!) 143/68 (!) 166/76 (!) 169/90  ?Pulse:  72 79 61  ?Resp:  '17 18 16  '$ ?Temp:  98 ?F (36.7 ?C) 98.4 ?F (36.9 ?C) 97.9 ?F (36.6 ?C)  ?TempSrc:  Oral Oral Oral  ?SpO2:  98% 100% 100%  ?Weight: 89.7 kg     ?Height:      ? ?Elderly adult male, sitting in recliner, watching television ?RRR, no murmurs, no peripheral edema ?Left shoulder in sling, moves EXTR fairly well ?Respiratory rate normal, lungs clear without rales or wheezes ?Attention normal, affect appropriate, judgment insight appear normal   ? ? ? ? ? ?Data Reviewed: ?Prior notes reviewed, nursing notes reviewed, vital signs reviewed. ?No new labs ? ?Family Communication:  ? ?Disposition: ?Status is: Inpatient ?Remains inpatient appropriate because:  ?Patient requires prolonged course of IV antibiotics, however he has a history of homelessness, alcohol abuse and has been seen twice in the emergency room within the month prior to admission for alcohol abuse, altered mental status, and head injury and so could be unsafe to discharge him on oral antibiotics at this time ? ? ? ? ? ? ? ? Planned Discharge Destination:  Unknown ? ? ? ?  ? ?Author: ?Edwin Dada, MD ?02/13/2022 3:48 PM ? ?For on call review [email or web address].  ?

## 2022-02-14 DIAGNOSIS — M545 Low back pain, unspecified: Secondary | ICD-10-CM | POA: Diagnosis not present

## 2022-02-14 DIAGNOSIS — F1010 Alcohol abuse, uncomplicated: Secondary | ICD-10-CM | POA: Diagnosis not present

## 2022-02-14 DIAGNOSIS — I2510 Atherosclerotic heart disease of native coronary artery without angina pectoris: Secondary | ICD-10-CM | POA: Diagnosis not present

## 2022-02-14 DIAGNOSIS — T8450XA Infection and inflammatory reaction due to unspecified internal joint prosthesis, initial encounter: Secondary | ICD-10-CM | POA: Diagnosis not present

## 2022-02-14 LAB — CREATININE, SERUM
Creatinine, Ser: 0.94 mg/dL (ref 0.61–1.24)
GFR, Estimated: >60 mL/min (ref >60)

## 2022-02-14 LAB — VANCOMYCIN, TROUGH: Vancomycin Tr: 15 ug/mL (ref 15–20)

## 2022-02-14 MED ORDER — CLONAZEPAM 0.5 MG PO TABS
0.5000 mg | ORAL_TABLET | Freq: Two times a day (BID) | ORAL | Status: AC
  Administered 2022-02-19 – 2022-02-22 (×8): 0.5 mg via ORAL
  Filled 2022-02-14 (×9): qty 1

## 2022-02-14 MED ORDER — CLONAZEPAM 0.25 MG PO TBDP
0.2500 mg | ORAL_TABLET | Freq: Two times a day (BID) | ORAL | Status: AC
Start: 2022-02-23 — End: 2022-02-26
  Administered 2022-02-23 – 2022-02-26 (×8): 0.25 mg via ORAL
  Filled 2022-02-14 (×8): qty 1

## 2022-02-14 MED ORDER — BUPRENORPHINE HCL-NALOXONE HCL 2-0.5 MG SL SUBL: 1.0000 | SUBLINGUAL_TABLET | SUBLINGUAL | Status: DC | PRN

## 2022-02-14 MED ORDER — BUPRENORPHINE HCL-NALOXONE HCL 8-2 MG SL SUBL
1.0000 | SUBLINGUAL_TABLET | Freq: Every day | SUBLINGUAL | Status: DC
  Administered 2022-02-15 – 2022-02-22 (×8): 1 via SUBLINGUAL
  Filled 2022-02-14 (×8): qty 1

## 2022-02-14 MED ORDER — CLONAZEPAM 0.5 MG PO TABS
1.0000 mg | ORAL_TABLET | Freq: Two times a day (BID) | ORAL | Status: AC
  Administered 2022-02-14 – 2022-02-18 (×9): 1 mg via ORAL
  Filled 2022-02-14 (×9): qty 2

## 2022-02-14 NOTE — Progress Notes (Signed)
Pharmacy Antibiotic Note ? ?Jeremiah [name] is a 71 y.o. male admitted on 01/15/2022 with  MRSA L shoulder joint infection .  Pharmacy has been consulted for Vancomycin dosing. ? ?Scr stable. Vancomycin trough 15 at goal, stable from previous levels. ? ?Plan: ?Continue Vancomycin 1250 MG IV Q24H to 4/11 (6 wks) ?Check Scr on 3/29  ?F/u Vanc levels and SCr every Monday   ? ?Height: '5\' 7"'$  (170.2 cm) ?Weight: 89.7 kg (197 lb 12 oz) ?IBW/kg (Calculated) : 66.1 ? ?Temp (24hrs), Avg:98.3 ?F (36.8 ?C), Min:97.9 ?F (36.6 ?C), Max:98.6 ?F (37 ?C) ? ?Recent Labs  ?Lab 02/10/22 ?0351 02/14/22 ?0929 02/14/22 ?0934  ?WBC 5.9  --   --   ?CREATININE 0.92 0.94  --   ?[place]  --   --  15  ? ?  ?Estimated Creatinine Clearance: 79.2 mL/min (by C-G formula based on SCr of 0.94 mg/dL).   ? ?Allergies  ?Allergen Reactions  ? Morphine Hives, Swelling and Rash  ?  Has taken with no reaction since original reaction  ? ? ?Antimicrobials this admission: ?Vanc 2/26 >> 4/11 ?Doxy PTA 2/14 >> 2/25 ? ?Dose adjustments this admission: ?3/3 VP/VT 26/18, AUC 558 on '750mg'$  q12 ?3/9 VP/VT 31/20, AUC 618 >> change to '1250mg'$  q24 ?3/11 SCr bump >>change to '1000mg'$  q24 ?3/14 SCr downt o 0.85>>back to 1250 mg q24h ?3/17 VP/VT 29/14, cAUC 534 on 1250 mg q24h ? ?Microbiology: ?2/26 MRSA PCR - positive ?2/27 abscess - MRSA ?2/28 L joint fluid, swab A - negative ?2/28 L joint fluid, swab B - negative ?2/28 L joint fluid, swab C - negative ? ?Thank you for allowing pharmacy to be a part of this patient?s care. ? ?Luisa Hart, PharmD, BCPS ?Clinical Pharmacist ?02/14/2022 1:57 PM  ? ?Please refer to [facility] for pharmacy phone number  ? ?

## 2022-02-14 NOTE — Progress Notes (Signed)
?  Progress Note ? ? ?Patient: Jeremiah [name] [id number]:[id number] DOB: 1950-12-09 DOA: 01/15/2022     30 ?DOS: the patient was seen and examined on 02/14/2022 ?  ?Brief hospital course: ?See previous summary by Dr. Starla Link ?71 y.o. M with CAD s/p CABG 98, PE on Eliquis, HTN, opiate dependence who presented with shoulder pain.  ? ?Had had a fall and hematoma in the left chest in [place], that ultimately must have gotten infected because he got a debridement and wound vac.  In the context of that treatment was found to have a chronically dislocated shoulder, so someone in [place] replaced it.   ? ?Unfortunately, the shoulder pain he presented with here was a MRSA infection that had entered the prosthetic joint. ? ?Hospital stay complicated by homelessness. ? ? ? ? ? ? ?Assessment and Plan: ?*Prosthetic shoulder infection ?- COntinue vancomycin until 4/11, then PO per ID ? ? ? 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  ? ?Iron deficiency anemia due to chronic blood loss ?Iron indices low.  Hgb stable. ?- Finish IV iron ? ?CAD (coronary artery disease) ?CAD s/p cabg x4 1998 ?Chronic trifascicular block ?Hyperlipidemia ?- Avoid AV nodal blockade ?- Continue aspirin and Crestor ? ?Chronic back pain, spinal stenosis with opiate dependence and history of substance use disorder ?We again discussed taper versus suboxone.  He has elected Suboxone ?- Continue tizanidine, gabapentin, Wellbutrin ?- Start suboxone tomorrow ? ?  ? ?Anxiety ?- Continue hydroxyzine, Wellbutrin, Buspar (buspar and wellbutrin at max doses) ?- Continue clonazepam taper ? ? ? ? ? ? ?  ? ?Subjective: No new swelling, no fever, change in chronic pain ? ?Physical Exam: ?Vitals:  ? 02/13/22 1933 02/14/22 0551 02/14/22 0752 02/14/22 1551  ?BP: (!) 136/57 126/73 (!) 141/82 135/76  ?Pulse: 78  60 66 75  ?Resp: '18 18 19 19  '$ ?Temp: 98.5 ?F (36.9 ?C) 98.3 ?F (36.8 ?C) 98.6 ?F (37 ?C) 98.4 ?F (36.9 ?C)  ?TempSrc: Oral Oral  Oral  ?SpO2: 100% 100% 99% 100%  ?Weight:      ?Height:      ? ?Elderly adult male, sitting in recliner, watching television ?RRR, no murmurs, no peripheral edema ?Left shoulder in sling, moves EXTR fairly well ?Respiratory rate normal, lungs clear without rales or wheezes ?Attention normal, affect appropriate, judgment insight appear normal   ? ? ? ? ? ?Data Reviewed: ?Prior notes reviewed, nursing notes reviewed, vital signs reviewed. ?No new labs ? ?Family Communication:  ? ?Disposition: ?Status is: Inpatient ?Remains inpatient appropriate because:  ?Patient requires prolonged course of IV antibiotics, however he has a history of homelessness, alcohol abuse and has been seen twice in the emergency room within the month prior to admission for alcohol abuse, altered mental status, and head injury and so could be unsafe to discharge him on oral antibiotics at this time ? ? ? ? ? ? ? ? Planned Discharge Destination:  Unknown ? ? ? ?  ? ?Author: ?Edwin Dada, MD ?02/14/2022 4:19 PM ? ?For on call review [email or web address].  ?

## 2022-02-14 NOTE — Progress Notes (Signed)
Mobility Specialist Progress Note: ? ? 02/14/22 1002  ?Mobility  ?Activity Ambulated with assistance in hallway  ?Level of Assistance Standby assist, set-up cues, supervision of patient - no hands on  ?Assistive Device None  ?Distance Ambulated (ft) 570 ft  ?Activity Response Tolerated well  ?$Mobility charge 1 Mobility  ? ?Pt received in bathroom willing to participate in mobility. Compliants of shoulder pain. Left in chair with call bell in reach and all needs met.  ? ?Laramie Gelles ?Mobility Specialist ?Primary Phone [phone] ? ?

## 2022-02-14 NOTE — Progress Notes (Signed)
Occupational Therapy Treatment ?Patient Details ?Name: Jeremiah [name] ?MRN: [id number] ?DOB: 09/20/1951 ?Today's Date: 02/14/2022 ? ? ?History of present illness Pt is a 71 y/o M presenting to ED on 2/25 wtih L shoulder wound with pain radiating to chest. Found to have L shoulder anteromedial dislocation after fall 3 months ago. S/p removal of reverse shoulder implant with antibiotic spacer on 2/28. PMH includes CAD s/p bypass, hx of PE and DVT on eliquis, HTN and alcohol abuse. ?  ?OT comments ? Pt seen for OT session, able to complete LUE HEP with min-mod cuing, pt demonstrating ~75% of full AROM/PROM of L elbow, ~50% of forearm supination. Pt benefits from increased education and reinforcement of HEP and sling wear schedule as pt was not wearing sling upon arrival and reports passively ranging shoulder stating "I need to start some therapy with this shoulder". Educated/reviewed pt on strictly not moving shoulder actively or passively as his shoulder must remain immobilized to prevent instability. Pt verbalizes understanding, donned sling with mod A, RN aware. Pt able to complete LB dressing and bathing sitting EOB with cues to remain NWB on LUE. Pt presenting with impairments listed below, will follow acutely. Continue to recommend HHOT at d/c.  ? ?Recommendations for follow up therapy are one component of a multi-disciplinary discharge planning process, led by the attending physician.  Recommendations may be updated based on patient status, additional functional criteria and insurance authorization. ?   ?Follow Up Recommendations ? Home health OT  ?  ?Assistance Recommended at Discharge Intermittent Supervision/Assistance  ?Patient can return home with the following ? A little help with walking and/or transfers;Assistance with cooking/housework;Direct supervision/assist for financial management;Assist for transportation;Help with stairs or ramp for entrance;A lot of help with bathing/dressing/bathroom ?   ?Equipment Recommendations ? None recommended by OT  ?  ?Recommendations for Other Services   ? ?  ?Precautions / Restrictions Precautions ?Precaution Comments: L shoulder precautions ?Required Braces or Orthoses: Sling ?Restrictions ?Weight Bearing Restrictions: Yes ?LUE Weight Bearing: Non weight bearing ?Other Position/Activity Restrictions: sling on at all times except for dressing/exercise, AROM of hand/wrist/elbow ok, no shoulder AROM/PROM per MD  ? ? ?  ? ?Mobility Bed Mobility ?Overal bed mobility: Modified Independent ?Bed Mobility: Supine to Sit, Sit to Supine ?  ?  ?Supine to sit: Modified independent (Device/Increase time) ?Sit to supine: Modified independent (Device/Increase time) ?  ?  ?  ? ?Transfers ?Overall transfer level: Modified independent ?Equipment used: None ?Transfers: Sit to/from Stand ?Sit to Stand: Modified independent (Device/Increase time) ?  ?  ?  ?  ?  ?General transfer comment: no difficulty or LOB ?  ?  ?Balance Overall balance assessment: Modified Independent ?Sitting-balance support: Feet supported ?Sitting balance-Leahy Scale: Normal ?Sitting balance - Comments: able to reach down towards feet without LOB ?  ?Standing balance support: Single extremity supported, During functional activity ?Standing balance-Leahy Scale: Good ?  ?  ?  ?  ?  ?  ?  ?  ?  ?  ?  ?  ?   ? ?ADL either performed or assessed with clinical judgement  ? ?ADL Overall ADL's : Needs assistance/impaired ?  ?  ?  ?  ?  ?  ?Lower Body Bathing: Minimal assistance;Sitting/lateral leans;Sit to/from stand ?Lower Body Bathing Details (indicate cue type and reason): completed sitting EOB ?Upper Body Dressing : Sitting;Moderate assistance ?Upper Body Dressing Details (indicate cue type and reason): to don sling ?Lower Body Dressing: Supervision/safety;Sitting/lateral leans;Sit to/from stand ?Lower Body Dressing Details (  indicate cue type and reason): to don/doff socks ?Toilet Transfer: Ambulation;Regular Toilet;Min  guard ?Toilet Transfer Details (indicate cue type and reason): simulated in room without AD ?  ?  ?  ?  ?  ?  ?  ? ?Extremity/Trunk Assessment Upper Extremity Assessment ?Upper Extremity Assessment: LUE deficits/detail ?LUE Deficits / Details: hand/wrist ROM WFL, elbow AROM/PROM 75% ?  ?Lower Extremity Assessment ?Lower Extremity Assessment: Defer to PT evaluation ?  ?  ?  ? ?Vision   ?Vision Assessment?: No apparent visual deficits ?  ?Perception Perception ?Perception: Not tested ?  ?Praxis Praxis ?Praxis: Not tested ?  ? ?Cognition Arousal/Alertness: Awake/alert ?Behavior During Therapy: [facility] for tasks assessed/performed ?Overall Cognitive Status: Within Functional Limits for tasks assessed ?Area of Impairment: Problem solving ?  ?  ?  ?  ?  ?  ?  ?  ?  ?  ?  ?  ?  ?  ?  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  ?  ?   ?Exercises Exercises: General Upper Extremity, Hand exercises ?General Exercises - Upper Extremity ?Elbow Flexion: Left, 10 reps, Standing, AROM ?Elbow Extension: AROM, Left, 10 reps, Standing ?Wrist Flexion: AROM, Left, Seated, 10 reps ?Wrist Extension: AROM, Left, 10 reps, Seated ?Digit Composite Flexion: AROM, Left, 10 reps, Seated ?Composite Extension: AROM, Left, 10 reps, Seated ?Hand Exercises ?Forearm Supination: AROM, Left, 10 reps, Seated ?Forearm Pronation: AROM, Left, 10 reps, Seated ?Thumb Abduction: AROM, Left, 10 reps, Seated ?Thumb Adduction: AROM, Left, 10 reps, Seated ? ?  ?Shoulder Instructions   ? ? ?  ?General Comments continued to reiterate precautions/exercises with pt along with sling wear schedule as pt reporting not having sling on since last night. Pt reports passively moving shoulder on his own the other day, took time to review no AROM/PROM of his shoulder at this time, pt verbalized understanding but benefits from continued reinforcement  ? ? ?Pertinent Vitals/ Pain       Pain Assessment ?Pain  Assessment: Faces ?Pain Score: 6  ?Faces Pain Scale: Hurts even more ?Pain Location: L shoulder ?Pain Descriptors / Indicators: Sore ?Pain Intervention(s): Limited activity within patient's tolerance, Repositioned, Monitored during session ? ?Home Living   ?  ?  ?  ?  ?  ?  ?  ?  ?  ?  ?  ?  ?  ?  ?  ?  ?  ?  ? ?  ?Prior Functioning/Environment    ?  ?  ?  ?   ? ?Frequency ? Min 2X/week  ? ? ? ? ?  ?Progress Toward Goals ? ?OT Goals(current goals can now be found in the care plan section) ? Progress towards OT goals: Progressing toward goals ? ?Acute Rehab OT Goals ?Patient Stated Goal: to get better ?OT Goal Formulation: With patient ?Time For Goal Achievement: 02/28/22 ?Potential to Achieve Goals: Good ?ADL Goals ?Pt Will Perform Upper Body Dressing: with supervision;sitting;standing ?Pt Will Perform Lower Body Dressing: with modified independence;sitting/lateral leans;sit to/from stand ?Pt/caregiver will Perform Home Exercise Program: Left upper extremity;With written HEP provided;Independently  ?Plan Discharge plan remains appropriate;Frequency remains appropriate   ? ?Co-evaluation ? ? ?   ?  ?  ?  ?  ? ?  ?AM-PAC OT "6 Clicks" Daily Activity     ?Outcome Measure ? ? Help from another person eating meals?: None ?Help from another person taking care of personal grooming?: A Little ?Help from another  person toileting, which includes using toliet, bedpan, or urinal?: A Little ?Help from another person bathing (including washing, rinsing, drying)?: A Lot ?Help from another person to put on and taking off regular upper body clothing?: A Lot ?Help from another person to put on and taking off regular lower body clothing?: A Little ?6 Click Score: 17 ? ?  ?End of Session Equipment Utilized During Treatment: Other (comment) (sling) ? ?OT Visit Diagnosis: Unsteadiness on feet (R26.81);Other abnormalities of gait and mobility (R26.89);Muscle weakness (generalized) (M62.81);Pain ?  ?Activity Tolerance Patient tolerated  treatment well ?  ?Patient Left in bed;with call bell/phone within reach;with nursing/sitter in room ?  ?Nurse Communication Mobility status;Other (comment) (pt with dry/flaking skin on feet) ?  ? ?   ? ?Time: 1325-1407 ?OT

## 2022-02-14 NOTE — TOC Progression Note (Signed)
Transition of Care (TOC) - Progression Note  ? ? ?Patient Details  ?Name: Jeremiah [name] ?MRN: [id number] ?Date of Birth: 1950-12-10 ? ?Transition of Care (TOC) CM/SW Contact  ?Marilu Favre, RN ?Phone Number: ?02/14/2022, 4:13 PM ? ?Clinical Narrative:    ?Received a verbal consent from MD to assist patient in determining where his pensions are "going". TOC  discussed with TOC supervisor , this is something TOC team is unable to do  ? ? ?Expected Discharge Plan: [place] ?Barriers to Discharge: Continued Medical Work up ? ?Expected Discharge Plan and Services ?Expected Discharge Plan: [place] ?  ?Discharge Planning Services: CM Consult ?  ?Living arrangements for the past 2 months: [place] ?                ?  ?  ?  ?  ?  ?  ?  ?  ?  ?  ? ? ?Social Determinants of Health (SDOH) Interventions ?  ? ?Readmission Risk Interventions ?   ? View : No data to display.  ?  ?  ?  ? ? ?

## 2022-02-15 DIAGNOSIS — I2510 Atherosclerotic heart disease of native coronary artery without angina pectoris: Secondary | ICD-10-CM | POA: Diagnosis not present

## 2022-02-15 DIAGNOSIS — T8450XA Infection and inflammatory reaction due to unspecified internal joint prosthesis, initial encounter: Secondary | ICD-10-CM | POA: Diagnosis not present

## 2022-02-15 DIAGNOSIS — F1010 Alcohol abuse, uncomplicated: Secondary | ICD-10-CM | POA: Diagnosis not present

## 2022-02-15 DIAGNOSIS — M545 Low back pain, unspecified: Secondary | ICD-10-CM | POA: Diagnosis not present

## 2022-02-15 MED ORDER — FERROUS SULFATE 325 (65 FE) MG PO TABS
325.0000 mg | ORAL_TABLET | ORAL | Status: DC
  Administered 2022-02-16 – 2022-03-08 (×11): 325 mg via ORAL
  Filled 2022-02-15 (×14): qty 1

## 2022-02-15 NOTE — Plan of Care (Signed)
?  Problem: Nutrition: ?Goal: Adequate nutrition will be maintained ?Outcome: Progressing ?  ?Problem: Coping: ?Goal: Level of anxiety will decrease ?Outcome: Progressing ?  ?Problem: Skin Integrity: ?Goal: Risk for impaired skin integrity will decrease ?Outcome: Progressing ?  ?Problem: Safety: ?Goal: Ability to remain free from injury will improve ?Outcome: Not Progressing ?  ?

## 2022-02-15 NOTE — Plan of Care (Signed)
  Problem: Pain Managment: Goal: General experience of comfort will improve Outcome: Progressing   Problem: Safety: Goal: Ability to remain free from injury will improve Outcome: Progressing   

## 2022-02-15 NOTE — Progress Notes (Signed)
Nutrition Follow-up ? ?DOCUMENTATION CODES:  ? ?Severe malnutrition in context of social or environmental circumstances ? ?INTERVENTION:  ?Continue current diet as ordered ?Continue snacks TID  ?Continue Ensure Max po BID, each supplement provides 150 kcal and 30 grams of protein.  ?Continue MVI with minerals daily  ?Encourage adequate PO intake ? ?NUTRITION DIAGNOSIS:  ? ?Severe Malnutrition related to social / environmental circumstances (etoh and drug use, hx of gastric bypass) as evidenced by severe fat depletion, severe muscle depletion. ? ?Ongoing  ? ?GOAL:  ? ?Patient will meet greater than or equal to 90% of their needs ? ?Progressing; addressing via PO intake and nutrition supplements ? ?MONITOR:  ? ?PO intake, Labs, Supplement acceptance, Weight trends ? ?REASON FOR ASSESSMENT:  ? ?Consult ?Assessment of nutrition requirement/status, Poor PO, Wound healing ? ?ASSESSMENT:  ? ?71 y.o. male with history of CAD s/p CABGx4, HTN, HLD, CHF (EF 35-40%), GERD, gout, alcohol abuse, opioid dependency, hx of gastric bypass surgery, and CKD2, presented to ED with worsening left shoulder pain and left-sided neck pain from an injury which occurred ~a year ago. Noted pt is currently homeless and has been for about 6 months. ? ?2/28 - Op, left RSA implant removal with I&D of shoulder joint, placement of antibiotic spacer and wound vac. Hemovac drain placed ? ?Discussed pt in rounds and with RN prior to visiting with pt.  ? ?Patient sitting in recliner chair, eyes closed and resting most of visit. Per pt, pt endorses a decreased appetite today and reports that he did not have breakfast yet due resting at this time. Pt complains of L shoulder pain. Per pt, pt receiving Ensure Max and tolerating well. Pt reports that he has been receiving his snacks from nursing staff, but has not received a 10 AM snack today. Offered to bring in an Ensure or snack for pt, but pt refused at this time. Pt reports he will order lunch after he  wakes up.  ? ?Will continue current nutrition plan due to pt tolerating well and weight appears stable.  ? ?Admit weight: 86.2 kg ?Current weight: 90.1 kg  ?Per weight history, pt's weight appears stable, trending up.  ? ?PO intake meal documentation:  ?3/22 - 100% x 1 recorded meal ?3/21 - 100% x 2 recorded meals ?3/19 - 50% for breakfast, 25% for lunch ?3/17 - 25% for lunch, 100% for breakfast  ? ?Labs reviewed. BUN: 30  ? ?Medications reviewed and include: cholecalciferol, colace, lasix, MVI with minerals, protonix, potassium chloride SA, Ensure Max, Vitamin B-12, vancomycin ? ?Diet Order:   ?Diet Order   ? ?       ?  Diet regular Room service appropriate? Yes; Fluid consistency: Thin  Diet effective now       ?  ? ?  ?  ? ?  ? ? ?EDUCATION NEEDS:  ? ?Education needs have been addressed ? ?Skin:  Skin Assessment: Skin Integrity Issues: ?Skin Integrity Issues:: Other (Comment), Incisions ?Incisions: left shoulder ?Other: abrasions to the head, legs, and shoulders; ecchymosis to the bilateral arms and legs ? ?Last BM:  3/23 ? ?Height:  ? ?Ht Readings from Last 1 Encounters:  ?01/18/22 '5\' 7"'$  (1.702 m)  ? ? ?Weight:  ? ?Wt Readings from Last 1 Encounters:  ?02/15/22 90.1 kg  ? ? ?Ideal Body Weight:  67.3 kg ? ?BMI:  Body mass index is 31.11 kg/m?. ? ?Estimated Nutritional Needs:  ? ?Kcal:  2200-2500 kcal/d ? ?Protein:  120-135g/d ? ?Fluid:  2.3-2.5  L/d ? ? ? ?Maryruth Hancock, Dietetic Intern ?02/15/2022 1:58 PM ?

## 2022-02-15 NOTE — Progress Notes (Signed)
?Progress Note ? ? ?Patient: Jeremiah [name] HFW:[id number] DOB: 07-30-1951 DOA: 01/15/2022     31 ?DOS: the patient was seen and examined on 02/15/2022 ?  ? ? ? ?Brief hospital course: ?See previous extensive summary by Dr. Louanne Belton on 02/02/22 ?  ?In short: 71 y.o. M with CAD s/p CABG 98, PE on Eliquis, HTN, chronic pain and opiate dependence previously on Suboxone who presented with shoulder pain.  ? ?Had been living in [place] for several years, alcohol dependent, at some point had a fall and hematoma of the left chest, that ultimately must have gotten infected because he got a debridement and wound vac.  In the context of that treatment was found to have a chronically dislocated shoulder, he had a prosthetic shoulder replacement in [place].   ? ?In that context, he moved back to [place], and unfortunately, literally got off the bus with severe pain in his shoulder, came straight here and was found to have a MRSA infection that had entered the prosthetic joint. ? ?Hospital stay complicated by homelessness. ? ? ? ? ? ? ?Assessment and Plan: ?*Prosthetic shoulder infection ?- Continue vancomycin until 4/11, then PO per ID ? ? ?Chronic combined systolic and diastolic CHF (congestive heart failure) ([place]) ?Moderate to severe aortic regurgitation on prior echo now mild ?Aortic root 4.27 cm ?Euvolemic, without symptoms ?- Continue furosemide ?  ? ?Iron deficiency anemia due to chronic blood loss ?Iron indices low.  Hgb stable. Given IV iron. ?- Start oral iron ? ? ?CAD (coronary artery disease) ?CAD s/p cabg x4 1998 ?Chronic trifascicular block ?Hyperlipidemia ?- Avoid AV nodal blockade ?- Continue aspirin and Crestor ? ? ? ? ?Chronic back pain, spinal stenosis with opiate dependence and history of substance use disorder ?Patient established with chronic pain clinic in [place] and with opiate dependence, long term.  Currently has no established medical care here.  On my assumption of his care, the patient was taking  Xanax 1 mg TID and oxycodone 10 mg six times daily.  We discussed that it was unsafe to discharge with this regimen, without clear follow up, given his prior history of alcohol use (repeated admissions to ER in [place] with alcohol intoxication).   ? ?We discussed relative risks of abrupt cessation of oxycodone 60 mg daily (~90 OME) at the time of discharge, versus forced taper, versus transition to low dose Suboxone.  We successfully converted to buprenorphine 8 mg daily.  I feel this is much safer discharge plan, from a respiratory and safety standpoint, and also from the standpoint of withdrawal if he is unable to find a physician to continue the medication and has to abruptly cease after discharge, and lastly from a pain control standpoint, as it appears to be working both for his shoulder and his chronic back and knee pain. I would be willing to prescribe for him at discharge.   See below regarding benzodiazepines.  ?  ?- Continue tizanidine, gabapentin, Wellbutrin ?- Continue suboxone 8 mg daily ? ?  ? ?Anxiety ?It would be categorically unsafe to discharge this patient on benzodiazepines and buprenorphine, and we have started a scheduled clonazepam taper. ?- Continue hydroxyzine, Wellbutrin, Buspar (buspar and wellbutrin at max doses) ?- Continue clonazepam taper, every 3 days ? ? ? ? ? ? ?  ? ?Subjective: No new swelling, no fever, change in chronic pain ? ?Physical Exam: ?Vitals:  ? 02/14/22 1948 02/15/22 0453 02/15/22 0737 02/15/22 1519  ?BP: (!) 107/51 (!) 111/45 (!) 144/57 (!) 170/93  ?  Pulse:  83 (!) 58 (!) 58  ?Resp: '18 18 18 18  '$ ?Temp: 98.3 ?F (36.8 ?C) 98.6 ?F (37 ?C) 97.7 ?F (36.5 ?C) 98 ?F (36.7 ?C)  ?TempSrc: Oral Oral    ?SpO2: 96% 97% 99% 100%  ?Weight:  90.1 kg    ?Height:      ? ?Elderly adult male, sitting in recliner, watching television ?RRR, no murmurs, no peripheral edema ?Left shoulder in sling, moves EXTR fairly well ?Respiratory rate normal, lungs clear without rales or wheezes ?Attention  normal, affect appropriate, judgment insight appear normal   ? ? ? ? ? ?Data Reviewed: ?Prior notes reviewed, nursing notes reviewed, vital signs reviewed. ?No new labs ? ?Family Communication:  ? ?Disposition: ?Status is: Inpatient ?Remains inpatient appropriate because:  ?Patient requires prolonged course of IV antibiotics, however he currently has no home, and has no identification or access to his money to procure housing.  In the setting of alcohol abuse and having been seen twice in the emergency room in [place] within the month prior to admission for alcohol abuse, altered mental status, and head injury it would be unsafe to discharge him on oral antibiotics at this time and potentially limb threatening in the context of a prosthetic joint infection ? ? ? ? ? ? ? ? Planned Discharge Destination:  Unknown ? ? ? ?  ? ?Author: ?Edwin Dada, MD ?02/15/2022 7:04 PM ? ?For on call review [email or web address].  ?

## 2022-02-15 NOTE — Progress Notes (Signed)
Mobility Specialist Progress Note: ? ? 02/15/22 1027  ?Mobility  ?Activity Ambulated with assistance in hallway  ?Level of Assistance Independent  ?Assistive Device None  ?LUE Weight Bearing NWB  ?Distance Ambulated (ft) 570 ft  ?Activity Response Tolerated well  ?$Mobility charge 1 Mobility  ? ?Pt received in bed willing to participate in mobility. Complaints of L shoulder pain. Left in chair with call bell in reach and all needs met.  ? ?Jeremiah [name] ?Mobility Specialist ?Primary Phone [phone] ? ?

## 2022-02-16 DIAGNOSIS — T8450XA Infection and inflammatory reaction due to unspecified internal joint prosthesis, initial encounter: Secondary | ICD-10-CM | POA: Diagnosis not present

## 2022-02-16 DIAGNOSIS — E43 Unspecified severe protein-calorie malnutrition: Secondary | ICD-10-CM | POA: Diagnosis not present

## 2022-02-16 DIAGNOSIS — I5043 Acute on chronic combined systolic (congestive) and diastolic (congestive) heart failure: Secondary | ICD-10-CM | POA: Diagnosis not present

## 2022-02-16 LAB — CREATININE, SERUM
Creatinine, Ser: 0.91 mg/dL (ref 0.61–1.24)
GFR, Estimated: >60 mL/min (ref >60)

## 2022-02-16 NOTE — Progress Notes (Signed)
Patient was added to DTP list for discharge planning. Patient will receive IV antibiotics through 03/01/2022. ? ?Madilyn Fireman, MSW, LCSW ?Transitions of Care  Clinical Social Worker II ?[phone] ? ?

## 2022-02-16 NOTE — Progress Notes (Signed)
Mobility Specialist Progress Note: ? ? 02/16/22 1545  ?Mobility  ?Activity Ambulated with assistance in hallway  ?Level of Assistance Independent  ?Assistive Device None  ?Distance Ambulated (ft) 570 ft  ?Activity Response Tolerated well  ?$Mobility charge 1 Mobility  ? ?Pt received walking out of room. Complaints of shoulder pain. Required 1 seated break. Left in chair with call bell in reach and all needs met.  ? ?Windy Dudek ?Mobility Specialist ?Primary Phone [phone] ? ?

## 2022-02-16 NOTE — Progress Notes (Addendum)
?Progress Note ? ? ?Patient: Jeremiah [name] ACZ:[id number] DOB: September 01, 1951 DOA: 01/15/2022     32 ?DOS: the patient was seen and examined on 02/16/2022 ?  ? ? ? ?Brief hospital course: ?See previous extensive summary by Dr. Louanne Belton on 02/02/22 ?  ?In short: 71 y.o. M with CAD s/p CABG 98, PE on Eliquis, HTN, chronic pain and opiate dependence previously on Suboxone who presented with shoulder pain.  ? ?Had been living in [place] for several years, alcohol dependent, at some point had a fall and hematoma of the left chest, that ultimately must have gotten infected because he got a debridement and wound vac.  In the context of that treatment was found to have a chronically dislocated shoulder, he had a prosthetic shoulder replacement in [place].   ? ?In that context, he moved back to [place], and unfortunately, literally got off the bus with severe pain in his shoulder, came straight here and was found to have a MRSA infection that had entered the prosthetic joint. ? ?Hospital stay complicated by homelessness. ? ? ? ? ? ? ?Assessment and Plan: ?*Prosthetic shoulder infection ?- Continue vancomycin until 4/11, then PO per ID ? ? ?Chronic combined systolic and diastolic CHF (congestive heart failure) ([place]) ?Moderate to severe aortic regurgitation on prior echo now mild ?Aortic root 4.27 cm ?Euvolemic, without symptoms ?- Continue furosemide ?  ? ?Iron deficiency anemia due to chronic blood loss ?Iron indices low.  Hgb stable. Given IV iron. ?- Start oral iron ? ? ?CAD (coronary artery disease) ?CAD s/p cabg x4 1998 ?Chronic trifascicular block ?Hyperlipidemia ?- Avoid AV nodal blockade ?- Continue aspirin and Crestor ? ? ? ? ?Chronic back pain, spinal stenosis with opiate dependence and history of substance use disorder ?Patient established with chronic pain clinic in [place] and with opiate dependence, long term.  Currently has no established medical care here.  On my assumption of his care, the patient was taking  Xanax 1 mg TID and oxycodone 10 mg six times daily.  We discussed that it was unsafe to discharge with this regimen, without clear follow up, given his prior history of alcohol use (repeated admissions to ER in [place] with alcohol intoxication).   ? ?Dr. Loleta Books discussed relative risks of abrupt cessation of oxycodone 60 mg daily (~90 OME) at the time of discharge, versus forced taper, versus transition to low dose Suboxone.  We successfully converted to buprenorphine 8 mg daily.  I feel this is much safer discharge plan, from a respiratory and safety standpoint, and also from the standpoint of withdrawal if he is unable to find a physician to continue the medication and has to abruptly cease after discharge, and lastly from a pain control standpoint, as it appears to be working both for his shoulder and his chronic back and knee pain. I would be willing to prescribe for him at discharge.   See below regarding benzodiazepines.  ?  ?- Continue tizanidine, gabapentin, Wellbutrin ?- Continue suboxone 8 mg daily-we will need to contact the [facility] closer to discharge to assist patient in arranging outpatient follow-up.  At that time he will be given a telephone number to so he can contact them regarding arranging an appointment as well as an intake exam. Consideration given to weaing and stopping suboxone since this cannot be given at [facility]. May also consider once off Subxone resumption of OxyIR at much lower dose ? ?  ? ?Anxiety ?It would be categorically unsafe to discharge this patient  on benzodiazepines and buprenorphine, and we have started a scheduled clonazepam taper. ?- Continue hydroxyzine, Wellbutrin, Buspar (buspar and wellbutrin at max doses) ?- Continue clonazepam taper, every 3 days ? ? ? ? ? ? ?  ? ?Subjective:  ?Extensive discussion with patient.  It appears he lost his wallet in [place].  While it contains all of his important information including driver's license, Social Security card as  well as his information regarding his retirement funds from the city of [place].  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 and receives a pension.  Further history obtained and it is noted that his wife is deceased since 2021-01-06.  He has some friends in [place] but is unable to live with them.  He said that between his pension and his Social Security check he gets around $2600 per month but given current circumstances is unable to access those findings and he is not sure where some of his money is being deposited at this time.  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  Social worker updated on need to assist patient in regarding providing appropriate telephone numbers so he can contact the Social Security department as well as city of [place] to follow-up on monies owed to him. ? ?Physical Exam: ?Vitals:  ? 02/15/22 1519 02/15/22 1951 02/16/22 0501 02/16/22 0504  ?BP: (!) 170/93 99/69  114/64  ?Pulse: (!) 58 78  73  ?Resp: 18 18    ?Temp: 98 ?F (36.7 ?C) 99.5 ?F (37.5 ?C)  99.6 ?F (37.6 ?C)  ?TempSrc:  Oral  Oral  ?SpO2: 100% 99%  98%  ?Weight:   88.4 kg   ?Height:      ? ? ?Constitutional: NAD, calm, comfortable ?Respiratory: clear to auscultation bilaterally, no wheezing, no crackles. Normal respiratory effort. No accessory muscle use.  ?Cardiovascular: Regular rate and rhythm, no murmurs / rubs / gallops. No extremity edema. 2+ pedal pulses. No carotid bruits.  ?Abdomen: no tenderness, no masses palpated. No hepatosplenomegaly. Bowel sounds positive.  LBM 2/29 ?Neurologic: CN 2-12 grossly intact. Sensation intact, DTR normal. Strength 5/5 x all 4 extremities.  Ambulates independently ?Psychiatric: Alert and oriented x 3.  [place] to have very mild short-term memory deficits and gets frustrated if provided with too much information and has difficulty processing multiple questions posed to him.  Normal mood.  ? ? ?Data Reviewed: ?Prior notes  reviewed, nursing notes reviewed, vital signs reviewed. ?No new labs ? ?Family Communication:  ? ?Disposition: ?Status is: Inpatient ?Remains inpatient appropriate because:  ?Patient requires prolonged course of IV antibiotics, however he currently has no home, and has no identification or access to his money to procure housing.  In the setting of alcohol abuse and having been seen twice in the emergency room in [place] within the month prior to admission for alcohol abuse, altered mental status, and head injury it would be unsafe to discharge him on oral antibiotics at this time and potentially limb threatening in the context of a prosthetic joint infection ? ? ?Planned Discharge Destination:  ?Unknown -of note patient was last seen by his primary care physician on 12/27/2021 in [place] for complaint of insomnia and depression related to his wife's death-documented medications at that time were Neurontin and Vistaril ?He was seen by his cardiologist on 2/223 and documented medications at that time were aspirin 81 mg ?He does have a brother named Willie pain telephone number [phone] in  [place] ?He was admitted to the hospital in [facility] 12/31/2021 in regards to alcohol dependence alcohol withdrawal.  At that time he was reported with a history of opioid dependence and had previously been on Suboxone with Suboxone being discontinued during previous hospitalization. ? ?Of note his prior to admission medications included hydroxyzine, Norco 7.5-325, gabapentin 300 mg, Ambien, Xarelto, Zanaflex, Aldactone, Crestor, Seroquel, potassium, lisinopril, Levaquin, labetalol, Romycin, BuSpar, Suboxone, Bumex and allopurinol.  No mention of high-dose OxyContin ? ? ? ? ? ?Medically stable ?No still needs to finish antibiotic ? ?Inpatient ?For inpatient is because patient needs to continue IV antibiotics and currently is homeless therefore not eligible for home antibiotics ? ?COVID  vaccination status:  ?Moderna: 01/04/2020, 02/01/2020 and 09/22/2020 ? ?Consultants: ?Cardiology ?Infectious disease ?Orthopedics ?Procedures: ?Echocardiogram ?Removal of reverse shoulder replacement, extensive d

## 2022-02-16 NOTE — Progress Notes (Signed)
Mobility Specialist Progress Note: ? ? 02/16/22 1019  ?Mobility  ?Activity Ambulated with assistance in hallway  ?Level of Assistance Independent  ?Assistive Device None  ?Distance Ambulated (ft) 570 ft  ?Activity Response Tolerated well  ?$Mobility charge 1 Mobility  ? ?Pt received in chair willing to participate in mobility. Complaints of Shoulder and low back pain. Left in chair with call bell in reach and all needs met.  ? ?Myrene Bougher ?Mobility Specialist ?Primary Phone [phone] ? ?

## 2022-02-16 NOTE — Plan of Care (Signed)
  Problem: Safety: Goal: Ability to remain free from injury will improve Outcome: Progressing   

## 2022-02-16 NOTE — Progress Notes (Signed)
Occupational Therapy Treatment ?Patient Details ?Name: Jeremiah [name] ?MRN: [id number] ?DOB: Sep 19, 1951 ?Today's Date: 02/16/2022 ? ? ?History of present illness Pt is a 71 y/o M presenting to ED on 2/25 wtih L shoulder wound with pain radiating to chest. Found to have L shoulder anteromedial dislocation after fall 3 months ago. S/p removal of reverse shoulder implant with antibiotic spacer on 2/28. PMH includes CAD s/p bypass, hx of PE and DVT on eliquis, HTN and alcohol abuse. ?  ?OT comments ? Pt with less complaints this date. Continues to demonstrate impaired memory and problem solving. Pt is modified independent in toileting and standing grooming. Worked on dressing using compensatory strategies, including sling donning and doffing. Completed L elbow to hand AROM. Walked in hall pushing IV pole.   ? ?Recommendations for follow up therapy are one component of a multi-disciplinary discharge planning process, led by the attending physician.  Recommendations may be updated based on patient status, additional functional criteria and insurance authorization. ?   ?Follow Up Recommendations ? Home health OT  ?  ?Assistance Recommended at Discharge Intermittent Supervision/Assistance  ?Patient can return home with the following ? Assistance with cooking/housework;Direct supervision/assist for financial management;Assist for transportation;Help with stairs or ramp for entrance;A little help with bathing/dressing/bathroom ?  ?Equipment Recommendations ? None recommended by OT  ?  ?Recommendations for Other Services   ? ?  ?Precautions / Restrictions Precautions ?Precautions: Shoulder ?Shoulder Interventions: Shoulder sling/immobilizer;At all times ?Precaution Booklet Issued: Yes (comment) ?Required Braces or Orthoses: Sling ?Restrictions ?Weight Bearing Restrictions: Yes ?LUE Weight Bearing: Non weight bearing ?Other Position/Activity Restrictions: sling on at all times except for dressing/exercise, AROM of  hand/wrist/elbow ok, no shoulder AROM/PROM per MD  ? ? ?  ? ?Mobility Bed Mobility ?  ?  ?  ?  ?  ?  ?  ?  ?  ? ?Transfers ?Overall transfer level: Modified independent ?Equipment used:  (pushes IV pole) ?  ?  ?  ?  ?  ?  ?  ?  ?  ?  ?Balance Overall balance assessment: Modified Independent ?Sitting-balance support: Feet supported ?Sitting balance-Leahy Scale: Normal ?Sitting balance - Comments: able to reach down towards feet to don loose socks without LOB ?  ?Standing balance support: Single extremity supported, During functional activity ?Standing balance-Leahy Scale: Good ?  ?  ?  ?  ?  ?  ?  ?  ?  ?  ?  ?  ?   ? ?ADL either performed or assessed with clinical judgement  ? ?ADL Overall ADL's : Needs assistance/impaired ?  ?  ?  ?  ?  ?  ?  ?  ?Upper Body Dressing : Minimal assistance;Sitting;Moderate assistance ?Upper Body Dressing Details (indicate cue type and reason): min for front opening gown, cues to dress L UE first, min assist to doff sling, mod to don ?Lower Body Dressing: Set up;Sitting/lateral leans ?Lower Body Dressing Details (indicate cue type and reason): donned socks with R hand ?Toilet Transfer: Modified Independent ?  ?Toileting- Clothing Manipulation and Hygiene: Modified independent;Sitting/lateral lean ?  ?  ?  ?Functional mobility during ADLs: Modified independent (pushing IV pole) ?General ADL Comments: improved ability to don sling, assist for straps ?  ? ?Extremity/Trunk Assessment   ?  ?  ?  ?  ?  ? ?Vision   ?  ?  ?Perception   ?  ?Praxis   ?  ? ?Cognition Arousal/Alertness: Awake/alert ?Behavior During Therapy: [facility] for tasks assessed/performed ?Overall Cognitive Status:  Impaired/Different from baseline ?Area of Impairment: Memory, Problem solving ?  ?  ?  ?  ?  ?  ?  ?  ?  ?  ?Memory: Decreased short-term memory ?  ?  ?  ?Problem Solving: Slow processing, Decreased initiation, Difficulty sequencing, Requires verbal cues ?General Comments: continues to perseverate on not having a  drivers license ?  ?  ?   ?Exercises Exercises: General Upper Extremity ?General Exercises - Upper Extremity ?Elbow Flexion: Left, 10 reps, AROM, Seated ?Elbow Extension: AROM, Left, 10 reps, Seated ?Wrist Flexion: AROM, Left, Seated, 10 reps ?Wrist Extension: AROM, Left, 10 reps, Seated ?Digit Composite Flexion: AROM, Left, 10 reps, Seated ?Composite Extension: AROM, Left, 10 reps, Seated ? ?  ?Shoulder Instructions   ? ? ?  ?General Comments    ? ? ?Pertinent Vitals/ Pain       Pain Assessment ?Pain Assessment: Faces ?Faces Pain Scale: Hurts a little bit ?Pain Location: L ankle--chronic ?Pain Descriptors / Indicators: Sore ?Pain Intervention(s): Monitored during session, Premedicated before session, Repositioned ? ?Home Living   ?  ?  ?  ?  ?  ?  ?  ?  ?  ?  ?  ?  ?  ?  ?  ?  ?  ?  ? ?  ?Prior Functioning/Environment    ?  ?  ?  ?   ? ?Frequency ? Min 2X/week  ? ? ? ? ?  ?Progress Toward Goals ? ?OT Goals(current goals can now be found in the care plan section) ? Progress towards OT goals: Progressing toward goals ? ?Acute Rehab OT Goals ?OT Goal Formulation: With patient ?Time For Goal Achievement: 02/28/22 ?Potential to Achieve Goals: Good  ?Plan Discharge plan remains appropriate;Frequency remains appropriate   ? ?Co-evaluation ? ? ?   ?  ?  ?  ?  ? ?  ?AM-PAC OT "6 Clicks" Daily Activity     ?Outcome Measure ? ? Help from another person eating meals?: None ?Help from another person taking care of personal grooming?: A Little ?Help from another person toileting, which includes using toliet, bedpan, or urinal?: None ?Help from another person bathing (including washing, rinsing, drying)?: None ?Help from another person to put on and taking off regular upper body clothing?: None ?Help from another person to put on and taking off regular lower body clothing?: A Little ?6 Click Score: 22 ? ?  ?End of Session Equipment Utilized During Treatment: Other (comment) (sling) ? ?OT Visit Diagnosis: Unsteadiness on feet  (R26.81);Other abnormalities of gait and mobility (R26.89);Muscle weakness (generalized) (M62.81);Pain ?  ?Activity Tolerance Patient tolerated treatment well ?  ?Patient Left in chair;with call bell/phone within reach ?  ?Nurse Communication   ?  ? ?   ? ?Time: 5573-2202 ?OT Time Calculation (min): 28 min ? ?Charges: OT General Charges ?$OT Visit: 1 Visit ?OT Treatments ?$Self Care/Home Management : 8-22 mins ?$Therapeutic Exercise: 8-22 mins ? ?Nestor Lewandowsky, OTR/L ?Acute Rehabilitation Services ?Pager: [phone] ?Office: [phone]  ? ?Malka So ?02/16/2022, 3:17 PM ?

## 2022-02-17 DIAGNOSIS — T8450XA Infection and inflammatory reaction due to unspecified internal joint prosthesis, initial encounter: Secondary | ICD-10-CM | POA: Diagnosis not present

## 2022-02-17 MED ORDER — MELATONIN 5 MG PO TABS
5.0000 mg | ORAL_TABLET | Freq: Every day | ORAL | Status: DC
  Administered 2022-02-17 – 2022-03-07 (×19): 5 mg via ORAL
  Filled 2022-02-17 (×19): qty 1

## 2022-02-17 NOTE — Progress Notes (Signed)
Mobility Specialist: Progress Note ? ? 02/17/22 1528  ?Mobility  ?Activity Ambulated with assistance in hallway  ?Level of Assistance Independent after set-up  ?Assistive Device Other (Comment) ?(IV pole)  ?LUE Weight Bearing NWB  ?Distance Ambulated (ft) 570 ft  ?Activity Response Tolerated well  ?$Mobility charge 1 Mobility  ? ?Received pt in chair having no complaints and agreeable to mobility. Asymptomatic throughout ambulation, returned back to chair w/ call bell in reach and all needs met. ? ?Harrell Gave Cyerra Yim ?Mobility Specialist ?Mobility Specialist [place]: [phone] ?Mobility Specialist [place]: [phone] ? ?
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
## 2022-02-18 LAB — COMPREHENSIVE METABOLIC PANEL
ALT: 29 U/L (ref 0–44)
AST: 31 U/L (ref 15–41)
Albumin: 2.6 g/dL — ABNORMAL LOW (ref 3.5–5.0)
Alkaline Phosphatase: 87 U/L (ref 38–126)
Anion gap: 8 (ref 5–15)
BUN: 34 mg/dL — ABNORMAL HIGH (ref 8–23)
CO2: 24 mmol/L (ref 22–32)
Calcium: 8.9 mg/dL (ref 8.9–10.3)
Chloride: 104 mmol/L (ref 98–111)
Creatinine, Ser: 0.99 mg/dL (ref 0.61–1.24)
GFR, Estimated: >60 mL/min (ref >60)
Glucose, Bld: 85 mg/dL (ref 70–99)
Potassium: 3.8 mmol/L (ref 3.5–5.1)
Sodium: 136 mmol/L (ref 135–145)
Total Bilirubin: 0.6 mg/dL (ref 0.3–1.2)
Total Protein: 5.8 g/dL — ABNORMAL LOW (ref 6.5–8.1)

## 2022-02-18 LAB — CBC WITH DIFFERENTIAL/PLATELET
Abs Immature Granulocytes: 0.02 10*3/uL (ref 0.00–0.07)
Basophils Absolute: 0.1 10*3/uL (ref 0.0–0.1)
Basophils Relative: 1 %
Eosinophils Absolute: 0.5 10*3/uL (ref 0.0–0.5)
Eosinophils Relative: 10 %
HCT: 27.6 % — ABNORMAL LOW (ref 39.0–52.0)
Hemoglobin: 8.4 g/dL — ABNORMAL LOW (ref 13.0–17.0)
Immature Granulocytes: 0 %
Lymphocytes Relative: 25 %
Lymphs Abs: 1.2 10*3/uL (ref 0.7–4.0)
MCH: 26 pg (ref 26.0–34.0)
MCHC: 30.4 g/dL (ref 30.0–36.0)
MCV: 85.4 fL (ref 80.0–100.0)
Monocytes Absolute: 0.5 10*3/uL (ref 0.1–1.0)
Monocytes Relative: 10 %
Neutro Abs: 2.6 10*3/uL (ref 1.7–7.7)
Neutrophils Relative %: 54 %
Platelets: 245 10*3/uL (ref 150–400)
RBC: 3.23 MIL/uL — ABNORMAL LOW (ref 4.22–5.81)
RDW: 19.1 % — ABNORMAL HIGH (ref 11.5–15.5)
WBC: 4.8 10*3/uL (ref 4.0–10.5)
nRBC: 0 % (ref 0.0–0.2)

## 2022-02-18 LAB — VANCOMYCIN, TROUGH: Vancomycin Tr: 16 ug/mL (ref 15–20)

## 2022-02-18 NOTE — Plan of Care (Signed)
?  Problem: Education: Goal: Ability to demonstrate management of disease process will improve Outcome: Progressing Goal: Ability to verbalize understanding of medication therapies will improve Outcome: Progressing Goal: Individualized Educational Video(s) Outcome: Progressing   Problem: Activity: Goal: Capacity to carry out activities will improve Outcome: Progressing   Problem: Cardiac: Goal: Ability to achieve and maintain adequate cardiopulmonary perfusion will improve Outcome: Progressing   Problem: Education: Goal: Knowledge of General Education information will improve Description: Including pain rating scale, medication(s)/side effects and non-pharmacologic comfort measures Outcome: Progressing   Problem: Health Behavior/Discharge Planning: Goal: Ability to manage health-related needs will improve Outcome: Progressing   Problem: Clinical Measurements: Goal: Ability to maintain clinical measurements within normal limits will improve Outcome: Progressing Goal: Will remain free from infection Outcome: Progressing Goal: Diagnostic test results will improve Outcome: Progressing Goal: Respiratory complications will improve Outcome: Progressing Goal: Cardiovascular complication will be avoided Outcome: Progressing   Problem: Activity: Goal: Risk for activity intolerance will decrease Outcome: Progressing   Problem: Nutrition: Goal: Adequate nutrition will be maintained Outcome: Progressing   Problem: Coping: Goal: Level of anxiety will decrease Outcome: Progressing   Problem: Elimination: Goal: Will not experience complications related to bowel motility Outcome: Progressing Goal: Will not experience complications related to urinary retention Outcome: Progressing   Problem: Pain Managment: Goal: General experience of comfort will improve Outcome: Progressing   Problem: Safety: Goal: Ability to remain free from injury will improve Outcome: Progressing    Problem: Skin Integrity: Goal: Risk for impaired skin integrity will decrease Outcome: Progressing   Problem: Education: Goal: Knowledge of General Education information will improve Description: Including pain rating scale, medication(s)/side effects and non-pharmacologic comfort measures Outcome: Progressing   Problem: Health Behavior/Discharge Planning: Goal: Ability to manage health-related needs will improve Outcome: Progressing   Problem: Clinical Measurements: Goal: Ability to maintain clinical measurements within normal limits will improve Outcome: Progressing Goal: Will remain free from infection Outcome: Progressing Goal: Diagnostic test results will improve Outcome: Progressing Goal: Respiratory complications will improve Outcome: Progressing Goal: Cardiovascular complication will be avoided Outcome: Progressing   Problem: Activity: Goal: Risk for activity intolerance will decrease Outcome: Progressing   Problem: Nutrition: Goal: Adequate nutrition will be maintained Outcome: Progressing   Problem: Coping: Goal: Level of anxiety will decrease Outcome: Progressing   Problem: Elimination: Goal: Will not experience complications related to bowel motility Outcome: Progressing Goal: Will not experience complications related to urinary retention Outcome: Progressing   Problem: Pain Managment: Goal: General experience of comfort will improve Outcome: Progressing   Problem: Safety: Goal: Ability to remain free from injury will improve Outcome: Progressing   Problem: Skin Integrity: Goal: Risk for impaired skin integrity will decrease Outcome: Progressing   

## 2022-02-18 NOTE — Progress Notes (Addendum)
?Progress Note ?  ?  ?Patient: Jeremiah [name] IWL:[id number] DOB: 31-May-1951 DOA: 01/15/2022     33 ?DOS: the patient was seen and examined on 02/17/2022 ?  ?  ?  ?  ?Brief hospital course: ?See previous extensive summary by Dr. Louanne Belton on 02/02/22 ?  ?In short: 71 y.o. M with CAD s/p CABG 98, PE on Eliquis, HTN, chronic pain and opiate dependence previously on Suboxone who presented with shoulder pain.  ?  ?Had been living in [place] for several years, alcohol dependent, at some point had a fall and hematoma of the left chest, that ultimately must have gotten infected because he got a debridement and wound vac.  In the context of that treatment was found to have a chronically dislocated shoulder, he had a prosthetic shoulder replacement in [place].   ?  ?In that context, he moved back to [place], and unfortunately, literally got off the bus with severe pain in his shoulder, came straight here and was found to have a MRSA infection that had entered the prosthetic joint. ?  ?Hospital stay complicated by homelessness. ?  ?  ?  ?  ?Assessment and Plan: ?*Prosthetic shoulder infection ?- Continue vancomycin until 4/11, then PO per ID ?  ?  ?Chronic combined systolic and diastolic CHF (congestive heart failure) ([place]) ?Moderate to severe aortic regurgitation on prior echo now mild ?Aortic root 4.27 cm ?Euvolemic, without symptoms ?- Continue furosemide ?  ?Chronic alcoholism/short-term memory loss ?Patient appears to clinical findings consistent with mild dementia likely in the context of chronic alcoholism and possible a degree of Wernicke's encephalopathy ?Therapy members have also documented in their notes patient with abnormal cognitive functioning ?We will ask SLP to perform cognitive evaluation revealed only mild cognitive defects with a SLUMS score of 24 out of 30. Occupational therapy cognitive evaluation pending ?  ?  ?Iron deficiency anemia due to chronic blood loss ?Iron indices low.  Hgb stable. Given IV  iron. ?- Start oral iron ?  ?  ?CAD (coronary artery disease) ?CAD s/p cabg x4 1998 ?Chronic trifascicular block ?Hyperlipidemia ?- Avoid AV nodal blockade ?- Continue aspirin and Crestor ?  ?  ?  ?Chronic back pain, spinal stenosis with ? opiate dependence and history of substance use disorder ?Patient established with ? chronic pain clinic in [place] but when I questioned him he said he never went to a pain clinic.  Currently has no established medical care here.  When Dr Loleta Books assumed care care, the patient was taking Xanax 1 mg TID and oxycodone 10 mg six times daily.  He discussed w/ the patinet that it was unsafe to discharge with this regimen, without clear follow up, given his prior history of alcohol use (repeated admissions to ER in [place] with alcohol intoxication).   ?  ?Dr. Loleta Books discussed relative risks of abrupt cessation of oxycodone 60 mg daily (~90 OME) at the time of discharge, versus forced taper, versus transition to low dose Suboxone.  We successfully converted to buprenorphine 8 mg daily.  I feel this is much safer discharge plan, from a respiratory and safety standpoint, and also from the standpoint of withdrawal if he is unable to find a physician to continue the medication and has to abruptly cease after discharge, and lastly from a pain control standpoint, as it appears to be working both for his shoulder and his chronic back and knee pain. I would be willing to prescribe for him at discharge.   See below regarding benzodiazepines.  ?  ?-  Continue tizanidine, gabapentin, Wellbutrin ?- Continue suboxone 8 mg daily-we will need to contact the [facility] clinic closer to discharge to assist patient in arranging outpatient follow-up.  At that time he will be given a telephone number to so he can contact them regarding arranging an appointment as well as an intake exam.Consideration given to tapering and dc'ng Suboxone given it is anticipated patient will have an additional surgical  procedure to remove spacer and shoulder and he would likely benefit more from oxycodone as opposed to Suboxone in the immediate postoperative period.  I will discuss with my attending physician and if he agrees we will consult pharmacist for best way to transition to narcotic regimen ?  ?  ?  ?Anxiety ?It would be categorically unsafe to discharge this patient on benzodiazepines and buprenorphine, and we have started a scheduled clonazepam taper. ?- Continue hydroxyzine, Wellbutrin, Buspar (buspar and wellbutrin at max doses) ?- Continue clonazepam taper, every 3 days ?  ?  ?  ?  ?  ?  ?  ?  ?Subjective:  ?Sitting up in chair complaining of ongoing insomnia which has been present since his wife died.  Again seems to have difficulty in regards to recall of where his finances are being deposited, he thinks his wife may have opened a separate account and then begins to tell me about him having a state credit union and unsure how she was able to open something in a federal credit union, tells me that [place] has taken his driver's license away but when asked he stated it was not due to drunk driving.  Again he seems very unclear as to history.  States he is birth certificate is in the hospital closet in his room. ?  ?Physical Exam: ?      ?Vitals:  ?  02/16/22 0504 02/16/22 1944 02/17/22 0448 02/17/22 0600  ?BP: 114/64 (!) 105/44 (!) 107/56    ?Pulse: 73 81 81    ?Resp:   18 18    ?Temp: 99.6 ?F (37.6 ?C) 98.4 ?F (36.9 ?C) 99.2 ?F (37.3 ?C)    ?TempSrc: Oral Oral Oral    ?SpO2: 98% 97% 98%    ?Weight:       82 kg  ?Height:          ?  ?  ?Constitutional: NAD, calm, comfortable ?Respiratory: clear to auscultation bilaterally, no wheezing, no crackles. Normal respiratory effort. No accessory muscle use.  ?Cardiovascular: Regular rate and rhythm, no murmurs / rubs / gallops. No extremity edema. 2+ pedal pulses. No carotid bruits.  ?Abdomen: no tenderness, no masses palpated. No hepatosplenomegaly. Bowel sounds  positive.  LBM 2/29 ?Neurologic: CN 2-12 grossly intact. Sensation intact, DTR normal. Strength 5/5 x all 4 extremities.  Ambulates independently ?Psychiatric: Alert and oriented x 3.  [place] to have very mild short-term memory deficits and gets frustrated if provided with too much information and has difficulty processing multiple questions posed to him.  Normal mood.  ?  ?  ?Data Reviewed: ?Prior notes reviewed, nursing notes reviewed, vital signs reviewed. ?No new labs ?  ?Family Communication:  ?  ?Disposition: ?Status is: Inpatient ?Remains inpatient appropriate because:  ?Patient requires prolonged course of IV antibiotics, however he currently has no home, and has no identification or access to his money to procure housing.  In the setting of alcohol abuse and having been seen twice in the emergency room in [place] within the month prior to admission for alcohol abuse, altered mental  status, and head injury it would be unsafe to discharge him on oral antibiotics at this time and potentially limb threatening in the context of a prosthetic joint infection ?  ?  ?Planned Discharge Destination:  ?Unknown -of note patient was last seen by his primary care physician on 12/27/2021 in [place] for complaint of insomnia and depression related to his wife's death-documented medications at that time were Neurontin and Vistaril ?He was seen by his cardiologist on 12/23/21 and documented medications at that time were aspirin 81 mg ?He does have a brother named Kairo Laubacher telephone number [phone] in [place] ?He was admitted to the hospital in [facility] 12/31/2021 in regards to alcohol dependence alcohol withdrawal.  At that time he was reported with a history of opioid dependence and had previously been on Suboxone with Suboxone being discontinued during previous hospitalization. ?  ?Of note his prior to admission medications included hydroxyzine, Norco 7.5-325, gabapentin 300 mg,  Ambien, Xarelto, Zanaflex, Aldactone, Crestor, Seroquel, potassium, lisinopril, Levaquin, labetalol, Romycin, BuSpar, Suboxone, Bumex and allopurinol.  No mention of high-dose OxyContin ?  ?  ?  ?  ?  ?Medically sta

## 2022-02-18 NOTE — Progress Notes (Signed)
Pharmacy Antibiotic Note ? ?Jeremiah [name] is a 71 y.o. male admitted on 01/15/2022 with  MRSA L shoulder joint infection .  Pharmacy has been consulted for Vancomycin dosing. ? ?SCr starting to trend up again, VT up 16 this AM - continue for now based on prior levels but if further SCr increase, will adjust down dose ? ?Plan: ?Continue Vancomycin 1250 MG IV Q24H to 4/11 (6 wks) ?Repeat SCR in AM - if continues to increase plan to dose adjust ?F/u Vanc levels and SCr every Monday   ? ?Height: '5\' 7"'$  (170.2 cm) ?Weight: 82.2 kg (181 lb 3.5 oz) ?IBW/kg (Calculated) : 66.1 ? ?Temp (24hrs), Avg:98.3 ?F (36.8 ?C), Min:98.2 ?F (36.8 ?C), Max:98.4 ?F (36.9 ?C) ? ?Recent Labs  ?Lab 02/14/22 ?0929 02/14/22 ?0934 02/16/22 ?0911 02/18/22 ?8527 02/18/22 ?0935  ?WBC  --   --   --  4.8  --   ?CREATININE 0.94  --  0.91 0.99  --   ?[place]  --  15  --   --  16  ?  ?Estimated Creatinine Clearance: 72.2 mL/min (by C-G formula based on SCr of 0.99 mg/dL).   ? ?Allergies  ?Allergen Reactions  ? Morphine Hives, Swelling and Rash  ?  Has taken with no reaction since original reaction  ? ? ?Antimicrobials this admission: ?Vanc 2/26 >> 4/11 ?Doxy PTA 2/14 >> 2/25 ? ?Dose adjustments this admission: ?3/3 VP/VT 26/18, AUC 558 on '750mg'$  q12 ?3/9 VP/VT 31/20, AUC 618 >> change to '1250mg'$  q24 ?3/11 SCr bump >>change to '1000mg'$  q24 ?3/14 SCr downt o 0.85>>back to 1250 mg q24h ?3/17 VP/VT 29/14, cAUC 534 on 1250 mg q24h ?3/27 VT 15 - continued 1250 q24 ?3/31 SCr trending up again, VT up 16 - continue for now based on prior levels but if further SCr increase, will adjust down dose ? ?Microbiology: ?2/26 MRSA PCR - positive ?2/27 abscess - MRSA ?2/28 L joint fluid, swab A - negative ?2/28 L joint fluid, swab B - negative ?2/28 L joint fluid, swab C - negative ? ?Thank you for allowing pharmacy to be a part of this patient?s care. ? ?Sloan Leiter, PharmD, BCPS, BCCCP ?Clinical Pharmacist ?Clinical phone 02/18/2022 until 3:30 - [phone] ?Please  refer to [facility] for [place] numbers ?02/18/2022 11:46 AM  ? ?

## 2022-02-18 NOTE — Progress Notes (Signed)
Mobility Specialist Progress Note: ? ? 02/18/22 1021  ?Mobility  ?Activity Ambulated with assistance in hallway  ?Level of Assistance Independent  ?Assistive Device None  ?LUE Weight Bearing NWB  ?Distance Ambulated (ft) 570 ft  ?Activity Response Tolerated well  ?$Mobility charge 1 Mobility  ? ?Pt received in bed willing to participate in mobility. No complaints of pain. Left in chair with call bell in reach and all needs met.  ? ?Jeremiah [name] ?Mobility Specialist ?Primary Phone [phone] ? ?

## 2022-02-18 NOTE — Progress Notes (Signed)
?  Subjective: ?Patient is a 71 year old male who is about 1 month out from removal of reverse shoulder implant with placement of antibiotic spacer.  Continuing on IV antibiotics.  He states his shoulder pain is still moderate but continually improving.  He denies any fevers, chills, night sweats, drainage from his incision.  No other significant joint pains bothering him and his wrist pain has not returned. ? ?Objective: ?Vital signs in last 24 hours: ?Temp:  [98.3 ?F (36.8 ?C)-98.6 ?F (37 ?C)] 98.3 ?F (36.8 ?C) (03/31 0357) ?Pulse Rate:  [59-79] 73 (03/31 0357) ?Resp:  [18] 18 (03/31 0357) ?BP: (103-147)/(57-88) 103/68 (03/31 0357) ?SpO2:  [97 %-99 %] 97 % (03/31 0357) ?Weight:  [82.2 kg] 82.2 kg (03/31 0357) ? ?Intake/Output from previous day: ?03/30 0701 - 03/31 0700 ?In: 1520 [P.O.:1520] ?Out: 1100 [Urine:1100] ?Intake/Output this shift: ?No intake/output data recorded. ? ?Exam: ? ?Incision looks to be well-healed.  No sinus tract noted.  No surrounding erythema.  Sutures intact.  These were removed today.  Axillary nerve is intact with deltoid firing.  2+ radial pulse of the operative extremity.  Intact EPL, wrist extension, finger abduction, finger adduction, grip strength.  Intact bicep flexion and tricep extension. ? ?Labs: ?Recent Labs  ?  02/18/22 ?2725  ?HGB 8.4*  ? ?Recent Labs  ?  02/18/22 ?3664  ?WBC 4.8  ?RBC 3.23*  ?HCT 27.6*  ?PLT 245  ? ?Recent Labs  ?  02/16/22 ?0911 02/18/22 ?4034  ?NA  --  136  ?K  --  3.8  ?CL  --  104  ?CO2  --  24  ?BUN  --  34*  ?CREATININE 0.91 0.99  ?GLUCOSE  --  85  ?CALCIUM  --  8.9  ? ?No results for input(s): LABPT, INR in the last 72 hours. ? ?Assessment/Plan: ?Continue IV antibiotics per ID with transition to oral antibiotics on 03/01/2022.  Sutures removed today and incision looks to be well-healed.  Appreciate hospitalist management.  Discussed with patient today the options for the shoulder which really depend on how his bone stock looks when the cement spacer is  removed several weeks from now.   ? ? ?Gerrianne Scale Fawn Desrocher ?02/18/2022, 8:08 AM  ? ? ? ? ?

## 2022-02-18 NOTE — TOC Progression Note (Signed)
Transition of Care (TOC) - Progression Note  ? ? ?Patient Details  ?Name: Jeremiah [name] ?MRN: [id number] ?Date of Birth: 1951-03-03 ? ?Transition of Care (TOC) CM/SW Contact  ?Curlene Labrum, RN ?Phone Number: ?02/18/2022, 1:10 PM ? ?Clinical Narrative:    ?CM met with the patient at the bedside and the patient states that he developed chest pain when he was exiting the bus from leaving [place]  and never reached his hotel prior to hospitalization.  The patient states that he is no longer speaking with his brother and sister in law.  The patient states that he lost his wallet and identification and may have left it at his brother's home in [place].  I gave the patient a copy of his driver's license and SS# and asked that he call his church friend's in [place] to assist.  The patient was given the number to contact the SS office to discuss with them over the phone.  The patient gave be various numbers of family contacts to contact if needed. ? ?Dr. Kern Alberta - Doristine Bosworth ?[place], [address] ?Vickie Murlean Caller - friend ?[facility] - uncle [address] [phone] ? ?CM and MSW with DTP Team will continue to follow up with the patient with safe discharge plan. ? ? ?Expected Discharge Plan:  ([place] pay - with church assist) ?Barriers to Discharge: Continued Medical Work up ? ?Expected Discharge Plan and Services ?Expected Discharge Plan:  ([place] pay - with church assist) ?  ?Discharge Planning Services: CM Consult ?  ?Living arrangements for the past 2 months: [place] ?                ?  ?  ?  ?  ?  ?  ?  ?  ?  ?  ? ? ?Social Determinants of Health (SDOH) Interventions ?  ? ?Readmission Risk Interventions ? ?  02/18/2022  ?  1:08 PM  ?Readmission Risk Prevention Plan  ?Transportation Screening Complete  ?Medication Review Press photographer) Complete  ?PCP or Specialist appointment within 3-5 days of discharge Complete  ?[place] or Home Care Consult  Complete  ?SW Recovery Care/Counseling Consult Complete  ?Palliative Care Screening Not Applicable  ? ? ?

## 2022-02-19 DIAGNOSIS — T8450XA Infection and inflammatory reaction due to unspecified internal joint prosthesis, initial encounter: Secondary | ICD-10-CM | POA: Diagnosis not present

## 2022-02-19 LAB — CREATININE, SERUM
Creatinine, Ser: 1.04 mg/dL (ref 0.61–1.24)
GFR, Estimated: >60 mL/min (ref >60)

## 2022-02-19 MED ORDER — VANCOMYCIN HCL IN DEXTROSE 1-5 GM/200ML-% IV SOLN
1000.0000 mg | INTRAVENOUS | Status: DC
  Administered 2022-02-19: 1000 mg via INTRAVENOUS
  Filled 2022-02-19 (×2): qty 200

## 2022-02-19 NOTE — Progress Notes (Signed)
PROGRESS NOTE ? ?Brief Narrative: ?AEDYN KEMPFER is a 71 y.o. male who was admitted over a month ago with infected prosthetic left shoulder having just arrived back in [place] from [place]. He was treated with debridement and antibiotic spacer placement as well as IV antibiotics. Due to homelessness, he has remained inpatient for IV antibiotics with ultimate plans to remove the spacer. ? ?Subjective: ?No new complaints this morning. No fevers or change in pain level. Chronic back pain is unchanged. Getting up. ? ?Objective: ?BP (!) 125/51 (BP Location: Right Leg)   Pulse 60   Temp 98.1 ?F (36.7 ?C) (Oral)   Resp 18   Ht '5\' 7"'$  (1.702 m)   Wt 88.2 kg   SpO2 98%   BMI 30.46 kg/m?   ?Gen: Nontoxic, chronically ill-appearing male ?Pulm: Clear and nonlabored  ?CV: RRR, no murmur, no JVD, mild LE edema L > R which pt reports is chronic.  ?GI: Soft, NT, ND, +BS  ?Neuro: Alert and oriented. No focal deficits. ?Skin: LUE in sling, hand w/brisk cap refill, intact motor and sensory functions. ? ?Assessment & Plan: ?This 71yo gentleman remains inpatient for IV antibiotics. CSW has been diligently working to facilitate a safe discharge. We will continue medications as ordered currently. Given his chronic back pain, chronic high dose oxycodone use, I suspect that suboxone presents a good treatment option for him going forward from a medical perspective, though his practical ability to follow up with a suboxone clinic may limit its use. Postoperative pain may be better controlled with full opioid agonist, so he may be initiated on oral opioid with hopes of later transitioning back to suboxone. His pain is currently reasonably controlled, so we will not change management over the weekend.  ? ?Patrecia Pour, MD ?Pager on amion ?02/19/2022, 10:49 AM   ?

## 2022-02-19 NOTE — Progress Notes (Signed)
Pharmacy Antibiotic Note ? ?Jeremiah [name] is a 71 y.o. male admitted on 01/15/2022 with  MRSA L shoulder joint infection .  Pharmacy has been consulted for Vancomycin dosing. ? ?SCr continues to trend up again, VT up 16 on 3/31 - will reduced based on prior levels and Scr trend.  ? ?Plan: ?Reduce to Vancomycin 1000 MG IV Q24H to 4/11 (6 wks) ?Will continue to trend Scr ?F/u vanc level when appropriate ? ?Height: '5\' 7"'$  (170.2 cm) ?Weight: 83 kg (182 lb 15.7 oz) ?IBW/kg (Calculated) : 66.1 ? ?Temp (24hrs), Avg:98.2 ?F (36.8 ?C), Min:97.9 ?F (36.6 ?C), Max:98.5 ?F (36.9 ?C) ? ?Recent Labs  ?Lab 02/14/22 ?0929 02/14/22 ?0934 02/16/22 ?0911 02/18/22 ?9935 02/18/22 ?7017 02/19/22 ?0351  ?WBC  --   --   --  4.8  --   --   ?CREATININE 0.94  --  0.91 0.99  --  1.04  ?[place]  --  15  --   --  16  --   ? ?  ?Estimated Creatinine Clearance: 69.1 mL/min (by C-G formula based on SCr of 1.04 mg/dL).   ? ?Allergies  ?Allergen Reactions  ? Morphine Hives, Swelling and Rash  ?  Has taken with no reaction since original reaction  ? ? ?Antimicrobials this admission: ?Vanc 2/26 >> 4/11 ?Doxy PTA 2/14 >> 2/25 ? ?Dose adjustments this admission: ?3/3 VP/VT 26/18, AUC 558 on '750mg'$  q12 ?3/9 VP/VT 31/20, AUC 618 >> change to '1250mg'$  q24 ?3/11 SCr bump >>change to '1000mg'$  q24 ?3/14 SCr downt o 0.85>>back to 1250 mg q24h ?3/17 VP/VT 29/14, cAUC 534 on 1250 mg q24h ?3/27 VT 15 - continued 1250 q24 ?3/31 SCr trending up again, VT up 16 - continue for now based on prior levels but if further SCr increase, will adjust down dose ?4/1 Scr trending up, will dose reduce to 1000 mg q24 ? ?Microbiology: ?2/26 MRSA PCR - positive ?2/27 abscess - MRSA ?2/28 L joint fluid, swab A - negative ?2/28 L joint fluid, swab B - negative ?2/28 L joint fluid, swab C - negative ? ?Thank you for allowing pharmacy to be a part of this patient?s care. ? ?Cephus Slater, PharmD, MBA ?Pharmacy Resident ?[phone] ?02/19/2022 8:56 AM ? ?

## 2022-02-19 NOTE — Progress Notes (Signed)
Mobility Specialist Progress Note: ? ? 02/19/22 1113  ?Mobility  ?Activity Ambulated with assistance in hallway  ?Level of Assistance Independent  ?Assistive Device None  ?LUE Weight Bearing NWB  ?Distance Ambulated (ft) 570 ft  ?Activity Response Tolerated well  ?$Mobility charge 1 Mobility  ? ?Pt received in bed willing to participate in mobility. No complaints of pain. Left in chair with call bell in reach and all needs met.  ? ?Cheron Pasquarelli ?Mobility Specialist ?Primary Phone [phone] ? ?

## 2022-02-20 DIAGNOSIS — T8450XA Infection and inflammatory reaction due to unspecified internal joint prosthesis, initial encounter: Secondary | ICD-10-CM | POA: Diagnosis not present

## 2022-02-20 LAB — BASIC METABOLIC PANEL
Anion gap: 4 — ABNORMAL LOW (ref 5–15)
BUN: 22 mg/dL (ref 8–23)
CO2: 26 mmol/L (ref 22–32)
Calcium: 8.9 mg/dL (ref 8.9–10.3)
Chloride: 110 mmol/L (ref 98–111)
Creatinine, Ser: 0.85 mg/dL (ref 0.61–1.24)
GFR, Estimated: >60 mL/min (ref >60)
Glucose, Bld: 98 mg/dL (ref 70–99)
Potassium: 3.7 mmol/L (ref 3.5–5.1)
Sodium: 140 mmol/L (ref 135–145)

## 2022-02-20 MED ORDER — VANCOMYCIN HCL 1250 MG/250ML IV SOLN
1250.0000 mg | INTRAVENOUS | Status: DC
  Administered 2022-02-20 – 2022-02-24 (×5): 1250 mg via INTRAVENOUS
  Filled 2022-02-20 (×6): qty 250

## 2022-02-20 NOTE — Progress Notes (Signed)
PHARMACY NOTE:  ANTIMICROBIAL RENAL DOSAGE ADJUSTMENT ? ?Current antimicrobial regimen includes a mismatch between antimicrobial dosage and estimated renal function.  As per policy approved by the Pharmacy & Therapeutics and Medical Executive Committees, the antimicrobial dosage will be adjusted accordingly. ? ?Current antimicrobial dosage:  Vancomycin 1000 MG IV Q24H  ? ?Indication: MRSA Joint Infection  ? ?Renal Function: ? ?Estimated Creatinine Clearance: 86.1 mL/min (by C-G formula based on SCr of 0.85 mg/dL). ?'[]'$      On intermittent HD, scheduled: ?'[]'$      On CRRT ?   ?Antimicrobial dosage has been changed to:  1250 MG IV Q24H  ? ?Additional comments: ?Renal function variable during admission. Scr improved overnight after dose adjustment on 4/1 due to bump in Scr. Will re-adjust to previous dose based on recent levels and renal function, continue to monitor.  ? ?Thank you for allowing pharmacy to be a part of this patient's care. ? ?Cephus Slater, PharmD, MBA ?Pharmacy Resident ?[phone] ?02/20/2022 9:02 AM ? ?

## 2022-02-20 NOTE — Progress Notes (Signed)
PROGRESS NOTE ? ?Brief Narrative: ?Jeremiah [name] is a 71 y.o. male who was admitted over a month ago with infected prosthetic left shoulder having just arrived back in [place] from [place]. He was treated with debridement and antibiotic spacer placement as well as IV antibiotics. Due to homelessness, he has remained inpatient for IV antibiotics with ultimate plans to remove the spacer. ? ?Subjective: ?Having mild anxiety with clonazepam taper, but will take hydroxyzine prn ? ?Objective: ?BP (!) 141/68 (BP Location: Right Leg)   Pulse 69   Temp 97.7 ?F (36.5 ?C) (Oral)   Resp 18   Ht '5\' 7"'$  (1.702 m)   Wt 86.3 kg   SpO2 100%   BMI 29.80 kg/m?   ?Gen: Nontoxic, chronically ill-appearing ?Pulm: Nonlabored on room air. Crackles at bases on first breaths that clear with deep inspiration. No wheeze ?CV: RRR, no murmur, no JVD, mild LE edema is unchanged. ?GI: Soft, NT, ND, +BS  ?Neuro: Ambulatory with antalgic narrow based gait. ?Skin: No change to LUE. ? ?Assessment & Plan: ?This 71yo gentleman remains inpatient for IV antibiotics. CSW has been diligently working to facilitate a safe discharge. We will continue medications as ordered currently. Given his chronic back pain, chronic high dose oxycodone use, I suspect that suboxone presents a good treatment option for him going forward from a medical perspective, though his practical ability to follow up with a suboxone clinic may limit its use. Postoperative pain may be better controlled with full opioid agonist, so he may be initiated on oral opioid with hopes of later transitioning back to suboxone. His pain is currently reasonably controlled, so we will not change management over the weekend.  ?- Vancomycin dosing adjustments per pharmacy are appreciated.  ?- Continue clonazepam taper with hydroxyzine prn. ? ?Patrecia Pour, MD ?Pager on amion ?02/20/2022, 11:48 AM   ?

## 2022-02-20 NOTE — Progress Notes (Signed)
Mobility Specialist Progress Note: ? ? 02/20/22 1038  ?Mobility  ?Activity Ambulated with assistance in hallway  ?Level of Assistance Independent  ?Assistive Device None  ?Distance Ambulated (ft) 570 ft  ?Activity Response Tolerated well  ?$Mobility charge 1 Mobility  ? ?Pt received in chair willing to participate in mobility. Complaints of 7/10 pain in shoulder and lower back. Required 1 seated break. Left in chair with call bell in reach and all needs met.  ? ?Jeremiah [name] ?Mobility Specialist ?Primary Phone [phone] ? ?

## 2022-02-20 NOTE — Progress Notes (Signed)
Mobility Specialist Progress Note: ? ? 02/20/22 1301  ?Mobility  ?Activity Ambulated independently in hallway  ?Level of Assistance Independent  ?Assistive Device None  ?Distance Ambulated (ft) 570 ft  ?Activity Response Tolerated well  ?$Mobility charge 1 Mobility  ? ?Savina Olshefski ?Mobility Specialist ?Primary Phone [phone] ? ?

## 2022-02-21 DIAGNOSIS — Z5900 Homelessness unspecified: Secondary | ICD-10-CM | POA: Diagnosis not present

## 2022-02-21 DIAGNOSIS — T8450XD Infection and inflammatory reaction due to unspecified internal joint prosthesis, subsequent encounter: Secondary | ICD-10-CM

## 2022-02-21 DIAGNOSIS — F1010 Alcohol abuse, uncomplicated: Secondary | ICD-10-CM | POA: Diagnosis not present

## 2022-02-21 LAB — CREATININE, SERUM
Creatinine, Ser: 0.82 mg/dL (ref 0.61–1.24)
GFR, Estimated: >60 mL/min (ref >60)

## 2022-02-21 NOTE — Progress Notes (Signed)
?Progress Note ?  ?  ?Patient: Jeremiah [name] CXK:[id number] DOB: [birth year] DOA: 01/15/2022     33 ?DOS: the patient was seen and examined on 02/17/2022 ?  ?  ?  ?  ?Brief hospital course: ?See previous extensive summary by Dr. Louanne Belton on 02/02/22 ?  ?In short: 71 y.o. M with CAD s/p CABG 98, PE on Eliquis, HTN, chronic pain and opiate dependence previously on Suboxone who presented with shoulder pain.  ?  ?Had been living in [place] for several years, alcohol dependent, at some point had a fall and hematoma of the left chest, that ultimately must have gotten infected because he got a debridement and wound vac.  In the context of that treatment was found to have a chronically dislocated shoulder, he had a prosthetic shoulder replacement in [place].   ?  ?In that context, he moved back to [place], and unfortunately, literally got off the bus with severe pain in his shoulder, came straight here and was found to have a MRSA infection that had entered the prosthetic joint. ?  ?Hospital stay complicated by homelessness. ?  ?  ?  ?  ?Assessment and Plan: ?*Prosthetic shoulder infection ?- Continue vancomycin until 4/11, then PO per ID ?-Patient will likely require removal of antibiotic spacer before discharge and after current antibiotic regimen is completed ?  ?Chronic combined systolic and diastolic CHF (congestive heart failure) ([place]) ?Moderate to severe aortic regurgitation on prior echo now mild ?Aortic root 4.27 cm ?Euvolemic, without symptoms ?- Continue furosemide ?  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  Patient exhibited same symptoms down there but memory loss and inability to function appeared to be selective.  Patient had no trouble  navigating outside of the home and obtaining alcohol, spending his Social Security check irresponsibly and utilizing methamphetamine. ? ?**Additional history obtained by case management from patient's family.  Please refer to her note for details.  Given this information patient is not an appropriate candidate for a long-acting narcotic substitute such as Suboxone.  Discussed this with my attending physician who agrees.  If patient does undergo surgical procedure prior to discharge plan is to transition over to Oxy IR otherwise if no surgical procedure planned with assistance of pharmacy team will need to transition back to Oxy IR with no more than four 10 mg doses per day.  Patient will receive a limited supply of Oxy IR at discharge and will have no refills. ?  ?  ?Iron deficiency anemia due to chronic blood loss ?Iron indices low.  Hgb stable. Given IV iron. ?- Start oral iron ?  ?  ?CAD (coronary artery disease) ?CAD s/p cabg x4 1998 ?Chronic trifascicular block ?Hyperlipidemia ?Avoid AV nodal blockade ?Continue aspirin and Crestor ?  ?Chronic back pain, spinal stenosis with ? opiate dependence and history of substance use disorder ?According to information obtained directly from the patient he states he was not in a pain clinic but apparently was obtaining Suboxone previously from PCP although documentation seems to reflect patient did go to a Suboxone clinic before this was weaned and discontinued ?Postoperatively patient was on a high dose of Oxy IR i.e. 10 mg 6 times a day to equal 60 mg ?Dr. Loleta Books had a lengthy discussion with him and plan was to transition to  Suboxone which patient is currently tolerating well ?Once surgery decision made by the orthopedic team and definite discharge date in place we will contact Suboxone clinic and let them know the patient needs to follow-up.  They will contact patient and give him a number so he can call and arrange for an intake exam. ?Continue tizanidine, gabapentin,  Wellbutrin ?Patient may require short-term use of oxy IR in the immediate postoperative timeframe but will once again be transitioned back to Suboxone.  May need to consult pharmacist for assistance ? ?Homelessness ?Patient apparently came up here by bus without a plan.  During this transition he lost his wallet including his driver's license ?During previous admissions while patient lived in the [place] area photocopies of his license were obtained and scanned into the system and he has been given a Engineer, materials here ?Patient has been given contact information regarding the Social Security office and he is yet to contact them to notify them he has moved to the [place] area.  According to the brother the patient receives over $3000 in retirement benefits.  At this point it is up to the patient to make sure this occurs.  If he has not contacted so security and confirm and obtain monies prior to discharge unfortunately we will need to discharge patient to [facility] ?Case management working closely with patient's prior charts to see if they will be of some assistance in regards to discharge plan and assisting patient ?  ? ?Anxiety ?Do not prescribe benzodiazepines in combination with Suboxone ?Continue hydroxyzine, Wellbutrin, Buspar (buspar and wellbutrin at max doses) ?Clonazepam has been tapered and discontinuation soon ?  ? ?  ?Subjective:  ?Sitting up in chair.  Continues to express inability to contact Social Security despite case manager pulling all documents out of his drawer.  He states that it is too soon of the day to do this.  We tell him that he does not have to do it now but he needs to start investigating soon and prior to discharge ?  ?Physical Exam: ?      ?Vitals:  ?  02/16/22 0504 02/16/22 1944 02/17/22 0448 02/17/22 0600  ?BP: 114/64 (!) 105/44 (!) 107/56    ?Pulse: 73 81 81    ?Resp:   18 18    ?Temp: 99.6 ?F (37.6 ?C) 98.4 ?F (36.9 ?C) 99.2 ?F (37.3 ?C)    ?TempSrc: Oral Oral Oral    ?SpO2: 98% 97%  98%    ?Weight:       82 kg  ?Height:          ?  ?  ?Constitutional: NAD, calm, comfortable ?Respiratory: clear to auscultation bilaterally, no wheezing, no crackles. Normal respiratory effort.  Room air ?Cardiovascular: Regular rate and rhythm, no murmurs / rubs / gallops. No extremity edema.  Normotensive ?Abdomen: no tenderness, no masses palpated. No hepatosplenomegaly. Bowel sounds positive.  LBM 3/31 ?Neurologic: CN 2-12 grossly intact. Sensation intact, DTR normal. Strength 5/5 x all 4 extremities.  Ambulates independently ?Psychiatric: Alert and oriented x 3.  Very mild short-term memory deficits ?  ?  ?Data Reviewed: ?Prior notes reviewed, nursing notes reviewed, vital signs reviewed. ?No new labs ?  ?Family Communication:  ?  ?Disposition: ?Status is: Inpatient ?Remains inpatient appropriate because:  ?Patient requires prolonged course of IV antibiotics, however he currently has no home, and has no identification or access to his money to procure housing.  In the setting of alcohol abuse and having been seen twice in the  emergency room in [place] within the month prior to admission for alcohol abuse, altered mental status, and head injury it would be unsafe to discharge him on oral antibiotics at this time and potentially limb threatening in the context of a prosthetic joint infection ?  ?  ?Planned Discharge Destination:  ?Current unsafe discharge plan.  Patient currently homeless and requires IV antibiotics through 4/11.  It is anticipated that once antibiotics are completed he will require surgery to remove antibiotic spacer before he has medically stable to discharge. ?  ?  ?Medically stable ?No still needs to finish antibiotic ?  ?Inpatient ?For inpatient is because patient needs to continue IV antibiotics and currently is homeless therefore not eligible for home antibiotics ?  ?COVID vaccination status:  ?Moderna: 01/04/2020, 02/01/2020 and 09/22/2020 ?  ?Consultants: ?Cardiology ?Infectious  disease ?Orthopedics ?Procedures: ?Echocardiogram ?Removal of reverse shoulder replacement, extensive debridement and placement of antibiotic spacer secondary to infective left prosthetic reverse shoulder

## 2022-02-21 NOTE — TOC Progression Note (Addendum)
Transition of Care (TOC) - Progression Note  ? ? ?Patient Details  ?Name: Jeremiah [name] ?MRN: [id number] ?Date of Birth: 12-10-1950 ? ?Transition of Care (TOC) CM/SW Contact  ?Curlene Labrum, RN ?Phone Number: ?02/21/2022, 11:58 AM ? ?Clinical Narrative:    ?CM met with the patient at the bedside to discuss transitions of care.  I met with the patient at the bedside to discuss patient's plans to call numerous contacts provided to the patient last week in order to speak with friends/family that might be willing to assist the patient with locating housing/bank.  The patient was given numerous numbers to contact with MSW last week but the patient has not attempted to call the numbers.  I provided the number to family and banks and asked that the patient call and the patient became irritable and states that he "would handle it later".  I explained to the patient that he would need to find available housing / friend support prior to being discharged after IV antibiotics - otherwise the patient would be safely discharged to the [facility] to assist with housing.  The patient is ambulatory and unable to admit to SNF facility to complete IV antibiotics since he is currently receiving suboxone for prior history of polysubstance abuse.  The patient gave verbal permission for me to call and speak with his brother - Jeremiah [name] - [phone] on the phone. ? ?CM called and spoke with the patient's brother, Jeremiah [name] on the phone and he states that the patient was living at this brother's house for more than a year and was unable to refrain from using "alcohol an meth" while he was living with them.  The brother states that the patient most likely lost his waller and ID after he "got drunk and used drugs".  I explained to the brother that the patient would be discharged to [name] once he is medically stable for discharge and he is aware.  The patient is not welcome back in his brother's home at this time.  The patient's brother  states that he will speak with his wife after work and may give case management/ MSW family/church contact numbers in the area as able to assist.  I also called and left a voicemail message with Jeremiah [name], uncle - [phone]. ? ?CM and MSW with DTP will continue to follow the patient for discharge needs - and once medically stable - will discharge to the [name] homeless shelter at a later date - once IV antibiotics are completed. ? ?02/21/2022 1352 - CM called and spoke with the patient's sister-in-law, Jeremiah [name] on the phone and the patient is unable/unwilling to help him with housing assistance anymore due to his history or drug/alcohol abuse.  The patient's sister-in-law states that the patient has had multiple admissions to hospitals, drug rehab centers, apartments, assisted living facilities, group homes and community shelters for help and that each time the patient has been thrown out of the facilities for "anger issues, non-compliance, drug abuse, and manipulative behaviors" per the sister-in-law.  The sister-in-law states that the patient lost his license for hit-and-run charges in [place], [place].  The family states that the patient has had long history of communication with [name] in the past and the church has "washed their hands from helping him since he continues to drink alcohol and abuse drugs".  The sister-in-law states that the patient makes $3,000/month through [name] and member account # is [id number].  CM will  give contact information to the bank including address, telephone contact that he can obtain funding for housing after discharged to [name].  The sister-in-law states that the patient only has a granddaughter, sister-in-law and brother in his life at this time and all three family members are unwilling/unable to assist the patient with finding housing after discharge from the hospital.   ? ?The patient's sister-in-law states that the patient has  long-standing history of alcohol and drug abuse including abuse of opioids (Oxycontin IR, Ambien, and Xanex) in the past and has numerous admissions in the past for intentional overdoses of these medications - including "crystal Methamphetamines.  I updated Jeremiah Hearing, NP of the above information and she plans to forward the information to the attending physician - Dr. Vance [name]. ? ?CM and MSW with DTP Team will continue to follow the patient for discharge to [facility] once medically stable for discharge.  The patient has a paper copy of his license at this time along with face-sheet to assist him with acquiring ID - assistance at his personal bank after discharge. ? ? ?Expected Discharge Plan: [name] ?Barriers to Discharge: Homeless with medical needs ? ?Expected Discharge Plan and Services ?Expected Discharge Plan: [name] ?In-house Referral: Clinical Social Work ?Discharge Planning Services: CM Consult ?  ?Living arrangements for the past 2 months: [name] (Patient was living with his brother/wife in [place] prior to admission to [name]) ?                ?  ?  ?  ?  ?  ?  ?  ?  ?  ?  ? ? ?Social Determinants of Health (SDOH) Interventions ?  ? ?Readmission Risk Interventions ? ?  02/21/2022  ? 11:53 AM 02/18/2022  ?  1:08 PM  ?Readmission Risk Prevention Plan  ?Transportation Screening Complete Complete  ?Medication Review Press photographer) Complete Complete  ?PCP or Specialist appointment within 3-5 days of discharge Complete Complete  ?[place] or Home Care Consult Complete Complete  ?SW Recovery Care/Counseling Consult Complete Complete  ?Palliative Care Screening Not Applicable Not Applicable  ?[place] Not Applicable   ? ? ?

## 2022-02-21 NOTE — Progress Notes (Signed)
Mobility Specialist Progress Note: ? ? 02/21/22 1011  ?Mobility  ?Activity Ambulated with assistance in hallway  ?Level of Assistance Independent  ?Assistive Device None  ?Distance Ambulated (ft) 570 ft  ?Activity Response Tolerated well  ?$Mobility charge 1 Mobility  ? ?Pt received in chair willing to participate in mobility. Complaints of shoulder pain. Left in chair with call bell in reach and all needs met.  ? ?Jeremiah [name] ?Mobility Specialist ?Primary Phone [phone] ? ?

## 2022-02-22 ENCOUNTER — Inpatient Hospital Stay: Payer: Medicare Other | Admitting: Internal Medicine

## 2022-02-22 DIAGNOSIS — I5022 Chronic systolic (congestive) heart failure: Secondary | ICD-10-CM

## 2022-02-22 DIAGNOSIS — T8450XD Infection and inflammatory reaction due to unspecified internal joint prosthesis, subsequent encounter: Secondary | ICD-10-CM | POA: Diagnosis not present

## 2022-02-22 MED ORDER — CALCIUM CARBONATE ANTACID 500 MG PO CHEW
1.0000 | CHEWABLE_TABLET | Freq: Three times a day (TID) | ORAL | Status: DC
  Administered 2022-02-22 – 2022-03-08 (×31): 200 mg via ORAL
  Filled 2022-02-22 (×40): qty 1

## 2022-02-22 MED ORDER — SPIRONOLACTONE 25 MG PO TABS
25.0000 mg | ORAL_TABLET | Freq: Every day | ORAL | Status: DC
  Administered 2022-02-22 – 2022-03-05 (×12): 25 mg via ORAL
  Filled 2022-02-22 (×12): qty 1

## 2022-02-22 MED ORDER — ADULT MULTIVITAMIN W/MINERALS CH
1.0000 | ORAL_TABLET | Freq: Two times a day (BID) | ORAL | Status: DC
  Administered 2022-02-23 – 2022-03-08 (×27): 1 via ORAL
  Filled 2022-02-22 (×27): qty 1

## 2022-02-22 NOTE — Progress Notes (Signed)
Nutrition Follow-up ? ?DOCUMENTATION CODES:  ? ?Severe malnutrition in context of social or environmental circumstances ? ?INTERVENTION:  ? ?Continue current diet as ordered ?Continue snacks TID  ?Continue Ensure Max po BID, each supplement provides 150 kcal and 30 grams of protein.  ?Increase MVI with minerals to BID ?500 mg Calcium carbonate TID ?Encourage adequate PO intake ? ? ?NUTRITION DIAGNOSIS:  ? ?Severe Malnutrition related to social / environmental circumstances (etoh and drug use, hx of gastric bypass) as evidenced by severe fat depletion, severe muscle depletion. ?Ongoing.  ? ?GOAL:  ? ?Patient will meet greater than or equal to 90% of their needs ?Progressing with diet and supplements  ? ?MONITOR:  ? ?PO intake, Labs, Supplement acceptance, Weight trends ? ?REASON FOR ASSESSMENT:  ? ?Consult ?Assessment of nutrition requirement/status, Poor PO, Wound healing ? ?ASSESSMENT:  ? ?71 y.o. male with history of CAD s/p CABGx4, HTN, HLD, CHF (EF 35-40%), GERD, gout, alcohol abuse, opioid dependency, hx of gastric bypass surgery, and CKD2, presented to ED with worsening left shoulder pain and left-sided neck pain from an injury which occurred ~a year ago. Noted pt is currently homeless and has been for about 6 months. ? ?2/28 - Op, left RSA implant removal with I&D of shoulder joint, placement of antibiotic spacer and wound vac. Hemovac drain placed  ? ?Spoke with pt who reports that he likes the protein shakes. States that sometimes he drinks ensure plus and sometimes the RN brings in ensure max. He prefers strawberry and chocolate.  ?Noted lunch is at bedside. He had consumed 2 bowls of vegetable soup but did not any of his [place] and cheese sandwich. He states he did not want to eat the bread. He does not want to gain any further weight. He wants to keep his weight around 170 lb.  ? ?We discussed importance of adequate nutrition/protein intake and vitamin/mineral intake.  ? ?Medications reviewed and  include: Vitamin D3 1000 IU dialy, colace, ferrous sulfate 325 mg every other day, MVI with minerals, protoinx, vitamin B12 1000 mcg daily  ? ?Labs reviewed:  ? ?Vitamin Labs:  ?Vitamin B1: 108.8 ?Vitamin D: 13.65 (L) ?Vitamin A: 25.7 ?Vitamin B12: 176 (L) ?Vitamin B6: 2 (L) ?Vitamin C: 0.6 ?Vitamin E: 9.0/1.1 ?Zinc: 45 ? ? ?Diet Order:   ?Diet Order   ? ?       ?  Diet regular Room service appropriate? Yes; Fluid consistency: Thin  Diet effective now       ?  ? ?  ?  ? ?  ? ? ?EDUCATION NEEDS:  ? ?Education needs have been addressed ? ?Skin:  Skin Assessment:  (open wound on L fifth toe) ?Skin Integrity Issues:: Other (Comment), Incisions ?Incisions: left shoulder ?Other: abrasions to the head, legs, and shoulders; ecchymosis to the bilateral arms and legs ? ?Last BM:  4/3 ? ?Height:  ? ?Ht Readings from Last 1 Encounters:  ?01/18/22 '5\' 7"'$  (1.702 m)  ? ? ?Weight:  ? ?Wt Readings from Last 1 Encounters:  ?02/22/22 87.9 kg  ? ? ?Ideal Body Weight:  67.3 kg ? ?BMI:  Body mass index is 30.35 kg/m?. ? ?Estimated Nutritional Needs:  ? ?Kcal:  2200-2500 kcal/d ? ?Protein:  120-135g/d ? ?Fluid:  2.3-2.5 L/d ? ?Lockie Pares., RD, LDN, CNSC ?See AMiON for contact information  ? ?

## 2022-02-22 NOTE — [place] FL2 (Signed)
?[place] MEDICAID FL2 LEVEL OF CARE SCREENING TOOL  ?  ? ?IDENTIFICATION  ?Patient Name: ?Jeremiah [name] Birthdate: 08-21-1951 Sex: male Admission Date (Current Location): ?01/15/2022  ?[place] and [place] Number: ? [place] ?  Facility and Address:  ?[facility], [place] [address], [place], [place] [address] ?     Provider Number: ?[id number]  ?Attending Physician Name and Address:  ?Patrecia Pour, MD ? Relative Name and Phone Number:  ?  ?   ?Current Level of Care: ?Hospital Recommended Level of Care: ?Assisted Living Facility Prior Approval Number: ?  ? ?Date Approved/Denied: ?  PASRR Number: ?pending ? ?Discharge Plan: ?Other (Comment) (ALF) ?  ? ?Current Diagnoses: ?Patient Active Problem List  ? Diagnosis Date Noted  ? Chronic systolic CHF (congestive heart failure) ([place])   ? Homelessness   ? Prosthetic joint infection ([place]) 02/11/2022  ? Allergic rhinitis due to pollen 01/30/2022  ? Amnesia 01/30/2022  ? Arthropathy 01/30/2022  ? Body mass index (BMI) 34.0-34.9, adult 01/30/2022  ? Erectile dysfunction due to arterial insufficiency 01/30/2022  ? Gout 01/30/2022  ? Hypertensive heart disease without congestive heart failure 01/30/2022  ? Insomnia 01/30/2022  ? Iron deficiency anemia 01/30/2022  ? Major depressive disorder, single episode, in full remission ([place]) 01/30/2022  ? Recurrent falls 01/30/2022  ? Spinal stenosis of lumbosacral region 01/30/2022  ? 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 Vitamin D deficiency 01/30/2022  ? Wrist pain 01/28/2022  ? Back pain 01/26/2022  ? Hypokalemia 01/25/2022  ? Protein-calorie malnutrition, severe 01/20/2022  ? CAD (coronary artery disease) 01/18/2022  ? Thoracic aortic aneurysm (TAA) ([place]) 01/17/2022  ? Acute on chronic combined systolic and diastolic CHF (congestive heart failure) ([place]) 01/16/2022  ? Atrial flutter ([place]) 01/16/2022  ? Traumatic hematoma of left shoulder 01/16/2022  ? Iron deficiency anemia due to chronic blood loss 01/16/2022  ?  Alcohol abuse 01/16/2022  ? Lower leg edema 01/16/2022  ? B12 deficiency 01/16/2022  ? Hypotension 01/16/2022  ? Shoulder wound, left, initial encounter 01/16/2022  ? Depression   ? Chest pain 01/15/2022  ? Alcohol withdrawal syndrome with complication ([place]) 17/51/0258  ? Homeless 11/22/2021  ? Opioid dependence ([place]) 11/22/2021  ? Generalized anxiety disorder 09/27/2021  ? Severe recurrent major 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 ([place]) 06/06/2021  ? Pressure injury of both heels, stage 1 05/18/2021  ? Substance use disorder 05/17/2021  ? History of coronary artery disease 05/17/2021  ? Pulmonary embolism ([place]) 04/23/2021  ? Degenerative disc disease, lumbar 04/23/2021  ? History of coronary artery bypass graft x 3 04/23/2021  ? History of gastric bypass 04/23/2021  ? Chronic pain syndrome 04/23/2021  ? Primary osteoarthritis involving multiple joints 04/23/2021  ? History of pulmonary embolism 03/29/2021  ? Polypharmacy 03/29/2021  ? Major neurocognitive disorder ([place]) 02/11/2021  ? Hypoalbuminemia 01/31/2021  ? AKI (acute kidney injury) ([place]) 10/13/2020  ? GERD (gastroesophageal reflux disease) 10/13/2020  ? Shoulder arthritis 02/27/2018  ? Morbid obesity ([place]) 07/24/2013  ? Expected blood loss anemia 07/24/2013  ? S/P right TKA 07/23/2013  ? AAA (abdominal aortic aneurysm) without rupture ([place]) 03/18/2011  ? EDEMA 03/09/2009  ? HYPERCHOLESTEROLEMIA 03/06/2009  ? Essential hypertension 03/06/2009  ? Coronary atherosclerosis 03/06/2009  ? BUNDLE BRANCH BLOCK, RIGHT 03/06/2009  ? BRADYCARDIA 03/06/2009  ? ? ?Orientation RESPIRATION BLADDER Height & Weight   ?  ?Time, Self, Situation, Place ? Normal Continent Weight: 193 lb 12.6 oz (87.9 kg) ?Height:  '5\' 7"'$  (170.2 cm)  ?BEHAVIORAL SYMPTOMS/MOOD  NEUROLOGICAL BOWEL NUTRITION STATUS  ?    Continent Diet (Normal)  ?AMBULATORY STATUS COMMUNICATION OF NEEDS Skin   ?Independent Verbally Normal ?  ?  ?  ?    ?     ?     ? ? ?Personal Care Assistance Level of Assistance   ?Bathing, Feeding, Dressing Bathing Assistance: Independent ?Feeding assistance: Independent ?Dressing Assistance: Independent ?   ? ?Functional Limitations Info  ?Hearing, Speech, Sight Sight Info: Adequate ?Hearing Info: Adequate ?Speech Info: Adequate  ? ? ?SPECIAL CARE FACTORS FREQUENCY  ?    ?  ?  ?  ?  ?  ?  ?   ? ? ?Contractures Contractures Info: Not present  ? ? ?Additional Factors Info  ?Code Status, Allergies, Psychotropic Code Status Info: Full Code ?Allergies Info: Morphine ?Psychotropic Info: Wellbutrin, Klonapin, Buspar, Seroquel ?  ?  ?   ? ?Current Medications (02/22/2022):  This is the current hospital active medication list ?Current Facility-Administered Medications  ?Medication Dose Route Frequency Provider Last Rate Last Admin  ? 0.9 %  sodium chloride infusion (Manually program via Guardrails IV Fluids)   Intravenous Once Pokhrel, Laxman, MD      ? acetaminophen (TYLENOL) tablet 650 mg  650 mg Oral Q6H PRN Tu, Ching T, DO   650 mg at 02/22/22 1244  ? allopurinol (ZYLOPRIM) tablet 100 mg  100 mg Oral Daily Kc, Ramesh, MD   100 mg at 02/22/22 1028  ? aspirin EC tablet 81 mg  81 mg Oral Daily Kc, Ramesh, MD   81 mg at 02/22/22 1028  ? buprenorphine-naloxone (SUBOXONE) 8-2 mg per SL tablet 1 tablet  1 tablet Sublingual Daily Danford, Suann Larry, MD   1 tablet at 02/22/22 1028  ? buPROPion (WELLBUTRIN XL) 24 hr tablet 300 mg  300 mg Oral q morning Kc, Ramesh, MD   300 mg at 02/22/22 1028  ? busPIRone (BUSPAR) tablet 15 mg  15 mg Oral TID Antonieta Pert, MD   15 mg at 02/22/22 1028  ? Chlorhexidine Gluconate Cloth 2 % PADS 6 each  6 each Topical Daily Pokhrel, Laxman, MD   6 each at 02/22/22 1029  ? cholecalciferol (VITAMIN D3) tablet 1,000 Units  1,000 Units Oral Daily Bartow Callas, NP   1,000 Units at 02/22/22 1028  ? clonazePAM (KLONOPIN) tablet 0.5 mg  0.5 mg Oral BID Edwin Dada, MD   0.5 mg at 02/22/22 1028  ? Followed by  ? Derrill Memo ON 02/23/2022] clonazePAM (KLONOPIN)  disintegrating tablet 0.25 mg  0.25 mg Oral BID Danford, Suann Larry, MD      ? docusate sodium (COLACE) capsule 100 mg  100 mg Oral BID Magnant, Charles L, PA-C   100 mg at 02/22/22 1028  ? enoxaparin (LOVENOX) injection 40 mg  40 mg Subcutaneous Q24H Magnant, Charles L, PA-C   40 mg at 02/22/22 1034  ? ferrous sulfate tablet 325 mg  325 mg Oral Coralyn Pear, MD   325 mg at 02/22/22 1028  ? gabapentin (NEURONTIN) capsule 300 mg  300 mg Oral TID Pokhrel, Laxman, MD   300 mg at 02/22/22 1028  ? hydrOXYzine (ATARAX) tablet 10 mg  10 mg Oral Q4H PRN Antonieta Pert, MD   10 mg at 02/22/22 1244  ? hydrOXYzine (ATARAX) tablet 25 mg  25 mg Oral TID Antonieta Pert, MD   25 mg at 02/22/22 1028  ? lidocaine (LIDODERM) 5 % 1 patch  1 patch Transdermal Q24H Pokhrel, Corrie Mckusick, MD  1 patch at 02/22/22 1028  ? melatonin tablet 5 mg  5 mg Oral QHS Samella Parr, NP   5 mg at 02/21/22 2135  ? menthol-cetylpyridinium (CEPACOL) lozenge 3 mg  1 lozenge Oral PRN Magnant, Charles L, PA-C      ? Or  ? phenol (CHLORASEPTIC) mouth spray 1 spray  1 spray Mouth/Throat PRN Magnant, Charles L, PA-C      ? metoCLOPramide (REGLAN) tablet 5-10 mg  5-10 mg Oral Q8H PRN Magnant, Charles L, PA-C      ? Or  ? metoCLOPramide (REGLAN) injection 5-10 mg  5-10 mg Intravenous Q8H PRN Magnant, Charles L, PA-C   10 mg at 02/18/22 2595  ? multivitamin with minerals tablet 1 tablet  1 tablet Oral Daily [place] Callas, NP   1 tablet at 02/22/22 1027  ? pantoprazole (PROTONIX) EC tablet 40 mg  40 mg Oral BID Pokhrel, Laxman, MD   40 mg at 02/22/22 1027  ? protein supplement (ENSURE MAX) liquid  11 oz Oral BID Edwin Dada, MD   11 oz at 02/22/22 1029  ? QUEtiapine (SEROQUEL) tablet 50 mg  50 mg Oral QHS Antonieta Pert, MD   50 mg at 02/21/22 2135  ? rosuvastatin (CRESTOR) tablet 40 mg  40 mg Oral Daily Kc, Ramesh, MD   40 mg at 02/22/22 1027  ? sodium chloride flush (NS) 0.9 % injection 10-40 mL  10-40 mL Intracatheter PRN Pokhrel, Laxman,  MD   10 mL at 02/12/22 1951  ? spironolactone (ALDACTONE) tablet 25 mg  25 mg Oral Daily Samella Parr, NP   25 mg at 02/22/22 1028  ? tiZANidine (ZANAFLEX) tablet 2 mg  2 mg Oral Q8H PRN Pokhrel, Laxman, MD   2 mg

## 2022-02-22 NOTE — Progress Notes (Addendum)
1:40pm: ?CSW sent clinicals to [facility] to review for ALF placement. ? ?CSW spoke with NP and Dr. Lovette Cliche to discuss the patient receiving a formal capacity evaluation. ? ?9am: ?CSW spoke with Altha Harm of [facility] apartments in [place] who states the patient must pay his outstanding balance prior to applying to return to the complex. Altha Harm states she is familiar with the patient and his deceased wife as the couple resided in the complex for several years. Altha Harm states she is willing to work with the patient if he is interested in returning to the complex. ? ?CSW spoke with patient at bedside to discuss discharge planning. CSW informed patient about the conversation mentioned above - patient states he would consider moving back into [facility] and is agreeable to pay his outstanding balance. Patient requested CSW call [facility] to obtain clarity regarding a legal matter however information was not given to CSW. [facility] states the individual has to call themselves to inquire about personal matters. Patient states he has no desire to live in [place] again with his family. Patient states he has no biological children, only step children - one in [place], [place] and one in [place], [place]. Patient states he does not get along with his sister in law in [place]. Patient reports the state has revoked his driving privileges. Patient states he lost his wallet in [place] therefore he has no forms of identification or debit cards to access his money. Patient was given phone number to call the church he is a member of in [place]. CSW asked patient to call the church for assistance to help prevent him discharging to the homeless shelter after the completion of his IV antibiotics. ? ?Madilyn Fireman, MSW, LCSW ?Transitions of Care  Clinical Social Worker II ?[phone] ? ?

## 2022-02-22 NOTE — Progress Notes (Signed)
Occupational Therapy Treatment ?Patient Details ?Name: Jeremiah [name] ?MRN: [id number] ?DOB: 04/09/1951 ?Today's Date: 02/22/2022 ? ? ?History of present illness Pt is a 71 y/o M presenting to ED on 2/25 wtih L shoulder wound with pain radiating to chest. Found to have L shoulder anteromedial dislocation after fall 3 months ago. S/p removal of reverse shoulder implant with antibiotic spacer on 2/28. PMH includes CAD s/p bypass, hx of PE and DVT on eliquis, HTN and alcohol abuse. ?  ?OT comments ? Pt progressing towards goals, at a mod I - minA level for ADLs, mod I for transfers and in-room ambulation while pushing IV pole. Pt able to complete UB/LB dressing and standing grooming task at sink, benefits from increased cuing to adhere to precautions and for compensatory strategies. Pt with poor recall of LUE exercises, able to perform when given demonstration, presents with decreased supination and elbow extension. Pt presenting with impairments listed below, will follow acutely. Continue to recommend [place].  ? ?Recommendations for follow up therapy are one component of a multi-disciplinary discharge planning process, led by the attending physician.  Recommendations may be updated based on patient status, additional functional criteria and insurance authorization. ?   ?Follow Up Recommendations ? Home health OT  ?  ?Assistance Recommended at Discharge Intermittent Supervision/Assistance  ?Patient can return home with the following ? Assistance with cooking/housework;Direct supervision/assist for financial management;Assist for transportation;Help with stairs or ramp for entrance;A little help with bathing/dressing/bathroom ?  ?Equipment Recommendations ? None recommended by OT  ?  ?Recommendations for Other Services   ? ?  ?Precautions / Restrictions Precautions ?Precautions: Shoulder ?Shoulder Interventions: Shoulder sling/immobilizer;At all times ?Precaution Comments: L shoulder precautions ?Required Braces or Orthoses:  Sling ?Restrictions ?Weight Bearing Restrictions: Yes ?LUE Weight Bearing: Non weight bearing ?Other Position/Activity Restrictions: sling on at all times except for dressing/exercise, AROM of hand/wrist/elbow ok, no shoulder AROM/PROM per MD  ? ? ?  ? ?Mobility Bed Mobility ?  ?  ?  ?  ?  ?  ?  ?General bed mobility comments: pt in chair on arrival ?  ? ?Transfers ?Overall transfer level: Modified independent ?Equipment used:  (IV pole) ?Transfers: Sit to/from Stand ?Sit to Stand: Modified independent (Device/Increase time) ?  ?  ?  ?  ?  ?General transfer comment: no difficulty or LOB ?  ?  ?Balance Overall balance assessment: Modified Independent ?Sitting-balance support: Feet supported ?Sitting balance-Leahy Scale: Normal ?Sitting balance - Comments: able to reach down towards feet to don loose socks without LOB ?  ?Standing balance support: Single extremity supported, During functional activity ?Standing balance-Leahy Scale: Good ?  ?  ?  ?  ?  ?  ?  ?  ?  ?  ?  ?  ?   ? ?ADL either performed or assessed with clinical judgement  ? ?ADL Overall ADL's : Needs assistance/impaired ?  ?  ?Grooming: Oral care;Wash/dry face;Wash/dry hands;Standing;Supervision/safety ?Grooming Details (indicate cue type and reason): completed standing at sink ?  ?  ?  ?  ?Upper Body Dressing : Minimal assistance;Sitting;Moderate assistance ?Upper Body Dressing Details (indicate cue type and reason): for compensatory strategies/donning sling & gown ?Lower Body Dressing: Set up;Sitting/lateral leans ?Lower Body Dressing Details (indicate cue type and reason): to don socks ?Toilet Transfer: Modified Independent ?Toilet Transfer Details (indicate cue type and reason): simulated in room with IV pole as AD ?Toileting- Clothing Manipulation and Hygiene: Modified independent;Sitting/lateral lean ?  ?  ?  ?Functional mobility during ADLs: Modified independent ?  ?  ? ?  Extremity/Trunk Assessment Upper Extremity Assessment ?Upper Extremity  Assessment: LUE deficits/detail ?LUE Deficits / Details: hand/wrist ROM WFL, elbow flexion/extension AROM/PROM 75%, supination ~75% ?  ?Lower Extremity Assessment ?Lower Extremity Assessment: Defer to PT evaluation;Overall [facility] for tasks assessed ?  ?  ?  ? ?Vision   ?Vision Assessment?: No apparent visual deficits ?  ?Perception Perception ?Perception: Not tested ?  ?Praxis Praxis ?Praxis: Not tested ?  ? ?Cognition Arousal/Alertness: Awake/alert ?Behavior During Therapy: [facility] for tasks assessed/performed ?Overall Cognitive Status: No family/caregiver present to determine baseline cognitive functioning ?Area of Impairment: Memory, Problem solving ?  ?  ?  ?  ?  ?  ?  ?  ?  ?  ?Memory: Decreased short-term memory ?  ?  ?  ?Problem Solving: Slow processing, Decreased initiation, Difficulty sequencing, Requires verbal cues ?General Comments: unable to recall LUE exercises despite providing written handout and reviewing them each session ?  ?  ?   ?Exercises Exercises: General Upper Extremity ?General Exercises - Upper Extremity ?Elbow Flexion: Left, 10 reps, AROM, Seated ?Elbow Extension: AROM, Left, 10 reps, Seated ?Wrist Flexion: AROM, Left, Seated, 10 reps ?Wrist Extension: AROM, Left, 10 reps, Seated ?Digit Composite Flexion: AROM, Left, 10 reps, Seated ?Composite Extension: AROM, Left, 10 reps, Seated ?Hand Exercises ?Forearm Supination: AROM, Left, 10 reps, Seated ?Forearm Pronation: AROM, Left, 10 reps, Seated ? ?  ?Shoulder Instructions   ? ? ?  ?General Comments continued education/reviewing precautions with pt  ? ? ?Pertinent Vitals/ Pain       Pain Assessment ?Pain Assessment: Faces ?Pain Score: 6  ?Faces Pain Scale: Hurts little more ?Pain Location: left shoulder ?Pain Descriptors / Indicators: Aching ?Pain Intervention(s): Limited activity within patient's tolerance, Monitored during session, Repositioned ? ?Home Living   ?  ?  ?  ?  ?  ?  ?  ?  ?  ?  ?  ?  ?  ?  ?  ?  ?  ?  ? ?  ?Prior  Functioning/Environment    ?  ?  ?  ?   ? ?Frequency ? Min 2X/week  ? ? ? ? ?  ?Progress Toward Goals ? ?OT Goals(current goals can now be found in the care plan section) ? Progress towards OT goals: Progressing toward goals ? ?Acute Rehab OT Goals ?Patient Stated Goal: to get better ?OT Goal Formulation: With patient ?Time For Goal Achievement: 02/28/22 ?Potential to Achieve Goals: Good ?ADL Goals ?Pt Will Perform Grooming: Independently;standing ?Pt Will Perform Upper Body Dressing: with supervision;sitting;standing ?Pt Will Perform Lower Body Dressing: with modified independence;sitting/lateral leans;sit to/from stand ?Pt/caregiver will Perform Home Exercise Program: Left upper extremity;With written HEP provided;Independently  ?Plan Discharge plan remains appropriate;Frequency remains appropriate   ? ?Co-evaluation ? ? ?   ?  ?  ?  ?  ? ?  ?AM-PAC OT "6 Clicks" Daily Activity     ?Outcome Measure ? ? Help from another person eating meals?: None ?Help from another person taking care of personal grooming?: A Little ?Help from another person toileting, which includes using toliet, bedpan, or urinal?: None ?Help from another person bathing (including washing, rinsing, drying)?: None ?Help from another person to put on and taking off regular upper body clothing?: A Little ?Help from another person to put on and taking off regular lower body clothing?: A Little ?6 Click Score: 21 ? ?  ?End of Session Equipment Utilized During Treatment: Other (comment) (sling) ? ?OT Visit Diagnosis: Unsteadiness on feet (R26.81);Other abnormalities of  gait and mobility (R26.89);Muscle weakness (generalized) (M62.81);Pain ?  ?Activity Tolerance Patient tolerated treatment well ?  ?Patient Left in chair;with call bell/phone within reach ?  ?Nurse Communication Mobility status ?  ? ?   ? ?Time: 3888-7579 ?OT Time Calculation (min): 29 min ? ?Charges: OT General Charges ?$OT Visit: 1 Visit ?OT Treatments ?$Self Care/Home Management :  23-37 mins ? ?Lynnda Child, OTD, OTR/L ?Acute Rehab ?[phone] ? ? ?Kaylyn Lim ?02/22/2022, 8:35 AM ?

## 2022-02-22 NOTE — Plan of Care (Signed)
?  Problem: Activity: ?Goal: Capacity to carry out activities will improve ?Outcome: Progressing ?  ?Problem: Clinical Measurements: ?Goal: Ability to maintain clinical measurements within normal limits will improve ?Outcome: Progressing ?  ?Problem: Activity: ?Goal: Risk for activity intolerance will decrease ?Outcome: Progressing ?  ?Problem: Pain Managment: ?Goal: General experience of comfort will improve ?Outcome: Progressing ?  ?Problem: Coping: ?Goal: Level of anxiety will decrease ?Outcome: Progressing ?  ?

## 2022-02-22 NOTE — Progress Notes (Signed)
?Progress Note ?  ?  ?Patient: Jeremiah [name] XFG:[id number] DOB: Feb 29, 1951 DOA: 01/15/2022     33 ?DOS: the patient was seen and examined on 02/17/2022 ?  ?  ?  ?  ?Brief hospital course: ?See previous extensive summary by Dr. Louanne Belton on 02/02/22 ?  ?In short: 71 y.o. M with CAD s/p CABG 98, PE on Eliquis, HTN, chronic pain and opiate dependence previously on Suboxone who presented with shoulder pain.  ?  ?Had been living in [place] for several years, alcohol dependent, at some point had a fall and hematoma of the left chest, that ultimately must have gotten infected because he got a debridement and wound vac.  In the context of that treatment was found to have a chronically dislocated shoulder, he had a prosthetic shoulder replacement in [place].   ?  ?In that context, he moved back to [place], and unfortunately, literally got off the bus with severe pain in his shoulder, came straight here and was found to have a MRSA infection that had entered the prosthetic joint. ?  ?Hospital stay complicated by homelessness. ?  ?  ?  ?  ?Assessment and Plan: ?*Prosthetic shoulder infection ?- Continue vancomycin until 4/11, then PO per ID ?-Patient will likely require removal of antibiotic spacer before discharge and after current antibiotic regimen is completed ?  ?Chronic combined systolic and diastolic CHF (congestive heart failure) ([place]) ?Moderate to severe aortic regurgitation on prior echo now mild ?Aortic root 4.27 cm ?Euvolemic, without symptoms ?- Continue furosemide ?  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  Patient exhibited same symptoms down there but memory loss and inability to function appeared to be selective.  Patient had no trouble  navigating outside of the home and obtaining alcohol, spending his Social Security check irresponsibly and utilizing methamphetamine. ? ?**Additional history obtained by case management from patient's family.  Please refer to her note for details.  Given this information patient is not an appropriate candidate for a long-acting narcotic substitute such as Suboxone.  Discussed this with my attending physician who agrees.  If patient does undergo surgical procedure prior to discharge plan is to transition over to Oxy IR otherwise if no surgical procedure planned with assistance of pharmacy team will need to transition back to Oxy IR with no more than four 10 mg doses per day.  Patient will receive a limited supply of Oxy IR at discharge and will have no refills. ?  ?  ?Iron deficiency anemia due to chronic blood loss ?Iron indices low.  Hgb stable. Given IV iron. ?- Start oral iron ?  ?  ?CAD (coronary artery disease) ?CAD s/p cabg x4 1998 ?Chronic trifascicular block ?Hyperlipidemia ?Avoid AV nodal blockade ?Continue aspirin and Crestor ? ?Chronic systolic heart failure ?Echocardiogram completed this admission with EF 35 to 40% with notable global hypokinesis-unable to estimate diastolic parameters mild AR and MR but moderate TR ?Patient with chronic venous insufficiency edema as well as edema secondary to prior vein harvesting for CABG ?Not tolerating oral potassium due to difficulty swallowing.  On low-dose Lasix 20 mg.  Have discontinued Lasix and potassium in favor of Aldactone ?Currently not on ACE consider initiating prior to discharge (no beta-blocker due to chronic trifascicular block)-would avoid Entresto since patient would not be compliant and  cost would be prohibitive ?Weights have been inconsistent with current weight 87.9 kg-currently in a negative balance of 7500 cc ?  ?Chronic back pain, spinal stenosis with ? opiate dependence and history of substance use disorder ?According to information obtained  directly from the patient he states he was not in a pain clinic but apparently was obtaining Suboxone previously from PCP although documentation seems to reflect patient did go to a Suboxone clinic before this was weaned and discontinued ?Postoperatively patient was on a high dose of Oxy IR i.e. 10 mg 6 times a day to equal 60 mg ?Dr. Loleta Books had a lengthy discussion with him and plan was to transition to Suboxone which patient is currently tolerating well ?Once surgery decision made by the orthopedic team and definite discharge date in place we will contact Suboxone clinic and let them know the patient needs to follow-up.  They will contact patient and give him a number so he can call and arrange for an intake exam. ?Continue tizanidine, gabapentin, Wellbutrin ?Patient may require short-term use of oxy IR in the immediate postoperative timeframe but will once again be transitioned back to Suboxone.  May need to consult pharmacist for assistance ? ?Homelessness ?Patient apparently came up here by bus without a plan.  During this transition he lost his wallet including his driver's license ?During previous admissions while patient lived in the [place] area photocopies of his license were obtained and scanned into the system and he has been given a Engineer, materials here ?Patient has been given contact information regarding the Social Security office and he is yet to contact them to notify them he has moved to the [place] area.  According to the brother the patient receives over $3000 in retirement benefits.  At this point it is up to the patient to make sure this occurs.  If he has not contacted so security and confirm and obtain monies prior to discharge unfortunately we will need to discharge patient to [facility] ?Case management working closely with patient's prior charts to see if they will be of some assistance in regards to discharge plan and assisting patient ?  ?Anxiety ?Do not prescribe benzodiazepines in  combination with Suboxone ?Continue hydroxyzine, Wellbutrin, Buspar (buspar and wellbutrin at max doses) ?Clonazepam has been tapered and discontinuation soon ?  ? ?  ?Subjective:  ?Laying in bed.  IV team changing PICC line dressing.  No complaints verbalized. ?  ?Physical Exam: ?      ?Vitals:  ?  02/16/22 0504 02/16/22 1944 02/17/22 0448 02/17/22 0600  ?BP: 114/64 (!) 105/44 (!) 107/56    ?Pulse: 73 81 81    ?Resp:   18 18    ?Temp: 99.6 ?F (37.6 ?C) 98.4 ?F (36.9 ?C) 99.2 ?F (37.3 ?C)    ?TempSrc: Oral Oral Oral    ?SpO2: 98% 97% 98%    ?Weight:       82 kg  ?Height:          ?  ?  ?Constitutional: NAD, calm, comfortable ?Respiratory: clear to auscultation bilaterally, no wheezing, no crackles. Normal respiratory effort.  Room air ?Cardiovascular: Regular rate and rhythm, no murmurs / rubs / gallops. No extremity edema.  Normotensive ?Abdomen: no tenderness, no masses palpated. No hepatosplenomegaly. Bowel sounds positive.  LBM 4/3 ?Neurologic: CN 2-12 grossly intact. Sensation intact, DTR normal. Strength 5/5 x all 4 extremities.  Ambulates independently ?Psychiatric: Alert and oriented x 3.  Very mild short-term memory deficits ?  ?  ?Data Reviewed: ?  Prior notes reviewed, nursing notes reviewed, vital signs reviewed. ?No new labs ?  ?Family Communication:  ?  ?Disposition: ?Status is: Inpatient ?Remains inpatient appropriate because:  ?Patient requires prolonged course of IV antibiotics, however he currently has no home, and has no identification or access to his money to procure housing.  In the setting of alcohol abuse and having been seen twice in the emergency room in [place] within the month prior to admission for alcohol abuse, altered mental status, and head injury it would be unsafe to discharge him on oral antibiotics at this time and potentially limb threatening in the context of a prosthetic joint infection ?  ?  ?Planned Discharge Destination:  ?Current unsafe discharge plan.  Patient currently  homeless and requires IV antibiotics through 4/11.  It is anticipated that once antibiotics are completed he will require surgery to remove antibiotic spacer before he has medically stable to discharge. ?  ?

## 2022-02-22 NOTE — Progress Notes (Signed)
Mobility Specialist Progress Note: ? ? 02/22/22 1010  ?Mobility  ?Activity Ambulated with assistance in hallway  ?Level of Assistance Independent  ?Assistive Device None  ?LUE Weight Bearing NWB  ?Distance Ambulated (ft) 570 ft  ?Activity Response Tolerated well  ?$Mobility charge 1 Mobility  ? ?Pt received in chair willing to participate in mobility. Complaints of shoulder pain. Left in chair with call bell in reach and all needs met.  ? ?Stephanee Barcomb ?Mobility Specialist ?Primary Phone [phone] ? ?

## 2022-02-23 ENCOUNTER — Ambulatory Visit: Payer: Medicare Other | Admitting: Physician Assistant

## 2022-02-23 DIAGNOSIS — I5022 Chronic systolic (congestive) heart failure: Secondary | ICD-10-CM | POA: Diagnosis not present

## 2022-02-23 DIAGNOSIS — T8450XD Infection and inflammatory reaction due to unspecified internal joint prosthesis, subsequent encounter: Secondary | ICD-10-CM | POA: Diagnosis not present

## 2022-02-23 DIAGNOSIS — Z5900 Homelessness unspecified: Secondary | ICD-10-CM | POA: Diagnosis not present

## 2022-02-23 MED ORDER — POTASSIUM CITRATE-CITRIC ACID 1100-334 MG/5ML PO SOLN: 40.0000 meq | Freq: Once | ORAL | Status: DC

## 2022-02-23 MED ORDER — FUROSEMIDE 10 MG/ML IJ SOLN
60.0000 mg | Freq: Once | INTRAMUSCULAR | Status: AC
Start: 2022-02-23 — End: 2022-02-23
  Administered 2022-02-23: 60 mg via INTRAVENOUS
  Filled 2022-02-23: qty 6

## 2022-02-23 MED ORDER — OXYCODONE HCL 5 MG PO TABS
10.0000 mg | ORAL_TABLET | Freq: Four times a day (QID) | ORAL | Status: DC
  Administered 2022-02-23 – 2022-03-02 (×28): 10 mg via ORAL
  Filled 2022-02-23 (×28): qty 2

## 2022-02-23 MED ORDER — IBUPROFEN 400 MG PO TABS
400.0000 mg | ORAL_TABLET | Freq: Four times a day (QID) | ORAL | Status: AC
  Administered 2022-02-23 – 2022-03-01 (×27): 400 mg via ORAL
  Filled 2022-02-23 (×27): qty 1

## 2022-02-23 NOTE — TOC Progression Note (Addendum)
Transition of Care (TOC) - Progression Note  ? ? ?Patient Details  ?Name: Jeremiah [name] ?MRN: [id number] ?Date of Birth: Dec 27, 1950 ? ?Transition of Care (TOC) CM/SW Contact  ?Curlene Labrum, RN ?Phone Number: ?02/23/2022, 2:16 PM ? ?Clinical Narrative:    ?CM met with the patient at the bedside to discuss transitions of care with the patient at the bedside. The patient states that is has mild memory issues after falling and hitting his head prior to admission to the hospital.  The patient does not have driver's liscense or present ID after losing his wallet prior to admission as well.  The patient states that he realizes that he will have difficult time obtaining money from his bank account without these items.  He is hopeful that his old church will be willing to assist him - but has not called [employer] to speak with the minister nor church members.  I suggested ALF placement to the patient and he did not decline placement at this time and realizes that he will need help with groceries, transportation and meal preparation.  Earlean Polka, CM with ALF was given clinicals to review at this time - facility location is [facility].  The patient is presently off Suboxone at this time. ? ?The patient requiring IV antibiotics through 03/01/2022 - since patient does not have a safe discharge plan at this time - no family support/currently homeless.  Surgery is following regarding Left shoulder antibiotic spacer. ? ?CM and MSW with DTP Team will continue to follow the patient for discharge planning. ? ? ?Expected Discharge Plan: [place] ?Barriers to Discharge: Homeless with medical needs ? ?Expected Discharge Plan and Services ?Expected Discharge Plan: [place] ?In-house Referral: Clinical Social Work ?Discharge Planning Services: CM Consult ?  ?Living arrangements for the past 2 months: [place] (Patient was living with his brother/wife in [place] prior to  admission to [facility]) ?                ?  ?  ?  ?  ?  ?  ?  ?  ?  ?  ? ? ?Social Determinants of Health (SDOH) Interventions ?  ? ?Readmission Risk Interventions ? ?  02/21/2022  ? 11:53 AM 02/18/2022  ?  1:08 PM  ?Readmission Risk Prevention Plan  ?Transportation Screening Complete Complete  ?Medication Review Press photographer) Complete Complete  ?PCP or Specialist appointment within 3-5 days of discharge Complete Complete  ?[place] or Home Care Consult Complete Complete  ?SW Recovery Care/Counseling Consult Complete Complete  ?Palliative Care Screening Not Applicable Not Applicable  ?[place] Not Applicable   ? ? ?

## 2022-02-23 NOTE — Progress Notes (Signed)
Mobility Specialist Progress Note: ? ? 02/23/22 1633  ?Mobility  ?Activity Ambulated with assistance in hallway  ?Level of Assistance Independent  ?LUE Weight Bearing NWB  ?Distance Ambulated (ft) 570 ft  ?Activity Response Tolerated well  ?$Mobility charge 1 Mobility  ? ?Second walk of the Jeremiah [name]. Complaints of shoulder pain. Left in chair with call bell in reach and all needs met. ? ?Jeremiah [name] ?Mobility Specialist ?Primary Phone [phone] ? ?

## 2022-02-23 NOTE — Progress Notes (Deleted)
?Cardiology Office Note:   ? ?Date:  02/23/2022  ? ?ID:  Jeremiah [name], DOB [birth year], MRN [id number] ? ?PCP:  Carol Ada, MD  ?[facility] Providers ?Cardiologist:  Jenkins Rouge, MD { ?Click to update primary MD,subspecialty MD or APP then REFRESH:1}  *** ?Referring MD: Carol Ada, MD  ? ?Chief Complaint:  No chief complaint on file. ?{Click here for Visit Info    :1}  ? ?Patient Profile: ?Jeremiah [name]  ?S/p CABG in 1998 ?Myoview in 2014: normal ?Myoview Oct 2022: no ischemia  ?(HFrEF) heart failure with reduced ejection fraction  ?Echocardiogram Aug 2022: EF 30-35 ?Echocardiogram Feb 2023: EF 35-40  ?Aortic insufficiency  ?Atrial flutter ?Rate control only; unable to pursue rhythm control ?Pauses noted in hospital in Feb 2022 - avoid AV nodal blocking agents  ?Not a good candidate for anticoagulation due to falls, substance abuse, homelessness ?Hypertension  ?Hyperlipidemia  ?Thoracic aortic aneurysm ?Trifascicular block; not on beta-blocker  ?Hx of pulmonary embolism  ?GERD ?Hx of gastric bypasss ?Polysubstance abuse ?Alcohol abuse ?Hx of falls ?Lg chest wall hematoma in Dec 2022 - req'd multiple I&Ds, wound vac ?Chronic dislocated L shoulder  ? ?Prior CV Studies: ?Chest CTA 01/16/2022 ?No pulmonary embolism ?Trace bilateral effusions ?Cardiomegaly ?Dilated main pulmonary artery ?Thoracic aortic aneurysm 4.7 cm ?Aortic atherosclerosis ? ?Echocardiogram 01/16/2022 ?EF 35-40, global HK, mild LVH, normal RVSF, normal PASP, moderate LAE, mild MR, mild-moderate TR, mild AI, AV sclerosis without stenosis, ascending aorta 43 mm ? ?Myoview 09/08/2021 [facility] health services) ?EF 46, no ischemia ? ?Echocardiogram 07/02/2021 [facility] health services ?EF 30-35, moderate LVH, moderate to severe AI, aortic root 4.27 cm ?{Select studies to display:26339}  ? ?History of Present Illness:   ?Jeremiah [name] is a 71 y.o. male with the above problem list.  He was last seen by Dr. Johnsie Cancel in  Nov 2021 via Telemedicine. ? ?He has been followed in [place] with Cardiology since then.  He was last seen in Feb 2023 by Dr. Chryl Heck of [facility] in [name], [place].       ?   ?Past Medical History:  ?Diagnosis Date  ? Anginal pain ([place]) 1998  ? "discomfort"  ? Anxiety   ? Arthritis   ? knees, ankles, shoulders  ? CAD (Jeremiah [name])   ? Depression   ? GERD (gastroesophageal reflux [name])   ? hx of  ? Gout   ? Headache   ? History of kidney stones   ? right side  ? HTN (hypertension)   ? Hypercholesteremia   ? Normal cardiac stress test 07/15/13  ? Right bundle branch block   ? ?Current Medications: ?No outpatient medications have been marked as taking for the 02/23/22 encounter (Appointment) with [name], PA-C.  ?  ?Allergies:   Morphine  ? ?Social History  ? ?Tobacco Use  ? Smoking status: Never  ? Smokeless tobacco: Never  ?Vaping Use  ? Vaping Use: Never used  ?Substance Use Topics  ? Alcohol use: No  ? Drug use: No  ?  ?Family Hx: ?The patient's family history includes Heart attack in his maternal grandfather; Heart [name] in his paternal grandfather. ? ?ROS  ? ?EKGs/Labs/Other Test Reviewed:   ? ?EKG:  EKG is *** ordered today.  The ekg ordered today demonstrates *** ? ?Recent Labs: ?01/15/2022: B Natriuretic Peptide 498.1 ?01/16/2022: TSH 1.588 ?02/10/2022: Magnesium 1.8 ?02/18/2022: ALT 29; Hemoglobin 8.4; Platelets 245 ?02/20/2022: BUN 22; Potassium 3.7; Sodium 140 ?02/21/2022: Creatinine,  Ser 0.82  ? ?Recent Lipid Panel ?No results for input(s): CHOL, TRIG, HDL, VLDL, LDLCALC, LDLDIRECT in the last 8760 hours.  ? ?Risk Assessment/Calculations:   ?{Does this patient have ATRIAL FIBRILLATION?:[id number]} ?    ?Physical Exam:   ? ?VS:  There were no vitals taken for this visit.   ? ?Wt Readings from Last 3 Encounters:  ?02/23/22 192 lb 0.3 oz (87.1 kg)  ?10/02/20 255 lb (115.7 kg)  ?03/17/20 242 lb (109.8 kg)  ?  ?Physical Exam *** ?    ?ASSESSMENT & PLAN:   ?No  problem-specific Assessment & Plan notes found for this encounter. ?  ?*** ?Typical atrial flutter  ?- rate controlled without AV nodal agents ?- not a candidate for [place] and therefore not a candidate for DCCV or ablation ?- moderately dilated left atrium on echo ?- 3 sec pause Monday 0359, no further pauses noted on telemetry ?- no AV nodal blockers given pause and rates controlled, continue to monitor ?  ?Need for chronic anticoagulation ?- hx of large chest wall hematoma after a fall in 10/2021 requiring multiple I&Ds and wound vac in Jan ?- not a good candidate for [facility] given polysubstance abuse, falls, and homelessness ?- since he is not a good candidate for [facility], this limits his options for rhythm control ?  ?Acute on chronic combined heart failure ?Aortic insufficiency ?Aortic root 4.27 cm ?Medication nonadherence ?- echo this admission with LVEF 35-40%, similar to  echo 07/02/21 with LVEF 30-35% ?- he did not take medications for about one month prior to admission ?- diuresed on 40 mg IV lasix, negative 11L on admission ?- renal function stable ?- Unable to add GDMT due to soft BP ?- Volume status improved, appears euvolemic.  Continue lasix 20 mg daily ?  ?NSVT ?Suspect driven by low magnesium (1.5 3/2), repleted.  Would maintain K>4, Mag>2 ?  ?  ?AKI ?Now resolved ?  ?  ?Anemia  ?Hb 8.6 ?- no signs of active bleeding, not on OAC ?- per primary ?  ?  ?CAD s/p CABG x 4 (LIMA-LAD, SVG-D1, SVG-OM1-OM2, SVG-PDA) - 1998 ?Chronic trifascicular block ?- low risk myoview in 2014 ?- nonischemic nuclear stress test 09/08/21 with EF estimated at 46% ?- continue ASA and statin ?  ?  ?Chronically dislocated left shoulder ?- In 10/2021 - wound cultures with MRSA ?- underwent surgery 2/28 ?  ?  ?Hx of PE ?Elevated D-dimer ?- CTA 02/06/21 with large burden of PE with central pulmonary artery distribution ?- pulmonology placed on eliquis, but now off - noted this was in the setting of lowe extremity cellulitis and minimal  mobility ?- CTA this admission negative for PE ?  ?  ?Thoracic aortic aneurysm ?- 4.7 cm on CTA yesterday ?- recommend repeat imaging in 6-12 months ?  ?  ?Disposition ?Alcohol abuse ?His brother and sister-in-law have refused to help the patient given history of alcohol abuse. He was discharged to a homeless shelter prior to leaving [facility] followed last ER visit. ?- he is currently homeless, not living in a shelter ?- he states his last drink was 2 weeks ago ?   ?  ?[facility] will sign off.   ?Medication Recommendations:  Lasix 20 mg daily, ASA 81 mg daily, rosuvastatin 40 mg daily ?Other recommendations (labs, testing, etc):  BMET within 1 week ?Follow up as an outpatient:  Will schedule ? ?     ?{Are you ordering a CV Procedure (e.g. stress test, cath, DCCV, TEE,  etc)?   Press F2        :[id number]  ?Dispo:  No follow-ups on file.  ? ?Medication Adjustments/Labs and Tests Ordered: ?Current medicines are reviewed at length with the patient today.  Concerns regarding medicines are outlined above.  ?Tests Ordered: ?No orders of the defined types were placed in this encounter. ? ?Medication Changes: ?No orders of the defined types were placed in this encounter. ? ?Signed, ?Richardson Dopp, PA-C  ?02/23/2022 12:21 PM    ?[facility] ?[address], [place], [place]  [address] ?Phone: [phone]; Fax: [phone]  ?

## 2022-02-23 NOTE — Progress Notes (Signed)
?Progress Note ?  ?  ?Patient: Jeremiah [name] OZH:[id number] DOB: Aug 30, 1951 DOA: 01/15/2022     33 ?DOS: the patient was seen and examined on 02/17/2022 ?  ?  ?  ?  ?Brief hospital course: ?See previous extensive summary by Dr. Louanne Belton on 02/02/22 ?  ?In short: 71 y.o. M with CAD s/p CABG 98, PE on Eliquis, HTN, chronic pain and opiate dependence previously on Suboxone who presented with shoulder pain.  ?  ?Had been living in [place] for several years, alcohol dependent, at some point had a fall and hematoma of the left chest, that ultimately must have gotten infected because he got a debridement and wound vac.  In the context of that treatment was found to have a chronically dislocated shoulder, he had a prosthetic shoulder replacement in [place].   ?  ?In that context, he moved back to [place], and unfortunately, literally got off the bus with severe pain in his shoulder, came straight here and was found to have a MRSA infection that had entered the prosthetic joint. ?  ?Hospital stay complicated by homelessness. ?  ?  ?  ?  ?Assessment and Plan: ?*Prosthetic shoulder infection ?- Continue vancomycin until 4/11, then PO per ID ?-Patient will likely require removal of antibiotic spacer before discharge and after current antibiotic regimen is completed ?  ?Chronic combined systolic and diastolic CHF (congestive heart failure) ([place]) ?Moderate to severe aortic regurgitation on prior echo now mild ?Aortic root 4.27 cm ?Euvolemic, without symptoms ?- Continue furosemide ?  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  Patient exhibited same symptoms down there but memory loss and inability to function appeared to be selective.  Patient had no trouble  navigating outside of the home and obtaining alcohol, spending his Social Security check irresponsibly and utilizing methamphetamine. ? ?**Additional history obtained by case management from patient's family.  Please refer to her note for details.  Given this information patient is not an appropriate candidate for a long-acting narcotic substitute such as Suboxone.  Discussed this with my attending physician who agrees.  If patient does undergo surgical procedure prior to discharge plan is to transition over to Oxy IR otherwise if no surgical procedure planned with assistance of pharmacy team will need to transition back to Oxy IR with no more than four 10 mg doses per day.  Patient will receive a limited supply of Oxy IR at discharge and will have no refills. ?  ?  ?Iron deficiency anemia due to chronic blood loss ?Iron indices low.  Hgb stable. Given IV iron. ?- Start oral iron ?  ?  ?CAD (coronary artery disease) ?CAD s/p cabg x4 1998 ?Chronic trifascicular block ?Hyperlipidemia ?Avoid AV nodal blockade ?Continue aspirin and Crestor ? ?Chronic systolic heart failure ?Echocardiogram completed this admission with EF 35 to 40% with notable global hypokinesis-unable to estimate diastolic parameters mild AR and MR but moderate TR ?Patient with chronic venous insufficiency edema as well as edema secondary to prior vein harvesting for CABG ?Not tolerating oral potassium due to difficulty swallowing.  On low-dose Lasix 20 mg.  Have discontinued Lasix and potassium in favor of Aldactone ?Currently not on ACE consider initiating prior to discharge (no beta-blocker due to chronic trifascicular block)-would avoid Entresto since patient would not be compliant and  cost would be prohibitive ?Weights have been inconsistent with current weight 87.9 kg-currently in a negative balance of 7500 cc ?4/5 noted with increased progression of lower extremity edema now 2-3+ pitting on right lower extremity.  Patient reports chronic pitting  edema to left lower extremity secondary to prior injury.  Patient unable to fasten pants at the waist.  Plan is to start with 1 dose of Lasix 60 mg IV and give potassium elixir since patient states can tolerate this better than a tablet which is difficult to swallow.  We will repeat labs in a.m. and follow urinary output and edema.  We will continue to give IV doses of Lasix until edema improved. ?  ?Chronic back pain, spinal stenosis with ? opiate dependence and history of substance use disorder ?According to information obtained directly from the patient he states he was not in a pain clinic but apparently was obtaining Suboxone previously from PCP although documentation seems to reflect patient did go to a Suboxone clinic before this was weaned and discontinued ?Postoperatively patient was on a high dose of Oxy IR i.e. 10 mg 6 times a day to equal 60 mg ?Dr. Loleta Books had a lengthy discussion with him and plan was to transition to Suboxone which patient is currently tolerating well ?Once surgery decision made by the orthopedic team and definite discharge date in place we will contact Suboxone clinic and let them know the patient needs to follow-up.  They will contact patient and give him a number so he can call and arrange for an intake exam. ?Continue tizanidine, gabapentin, Wellbutrin ?Patient may require short-term use of oxy IR in the immediate postoperative timeframe but will once again be transitioned back to Suboxone.  May need to consult pharmacist for assistance ? ?Homelessness ?Patient apparently came up here by bus without a plan.  During this transition he lost his wallet including his driver's license ?During previous admissions while patient lived in the [place] area photocopies of his license were obtained and scanned into the system and he has been given a Engineer, materials here ?Patient has been given contact information regarding the Social Security office and he is yet to contact them to notify them  he has moved to the [place] area.  According to the brother the patient receives over $3000 in retirement benefits.  At this point it is up to the patient to make sure this occurs.  If he has not contacted so security and confirm and obtain monies prior to discharge unfortunately we will need to discharge patient to [facility] ?Case management working closely with patient's prior charts to see if they will be of some assistance in regards to discharge plan and assisting patient ?  ?Anxiety ?Do not prescribe benzodiazepines in combination with Suboxone ?Continue hydroxyzine, Wellbutrin, Buspar (buspar and wellbutrin at max doses) ?Clonazepam has been tapered and discontinuation soon ?  ? ?  ?Subjective:  ?Laying in bed.  IV team changing PICC line dressing.  No complaints verbalized. ?  ?Physical Exam: ?      ?Vitals:  ?  02/16/22 0504 02/16/22 1944 02/17/22 0448 02/17/22 0600  ?BP: 114/64 (!) 105/44 (!) 107/56    ?Pulse: 73 81 81    ?Resp:   18 18    ?Temp: 99.6 ?F (37.6 ?C) 98.4 ?F (36.9 ?C) 99.2 ?F (37.3 ?C)    ?TempSrc: Oral Oral Oral    ?SpO2: 98% 97% 98%    ?Weight:       82 kg  ?Height:          ?  ?  ?  Constitutional: NAD, calm, comfortable ?Respiratory: clear to auscultation bilaterally, no wheezing, no crackles. Normal respiratory effort.  Room air ?Cardiovascular: Regular rate and rhythm, no murmurs / rubs / gallops.  Slightly worse in LLE edema which is chronic and pitting.  Progressive and somewhat new RLE edema pitting 2-3+.  Normotensive ?Abdomen: no tenderness, no masses palpated. No hepatosplenomegaly. Bowel sounds positive.  LBM 4/3 ?Neurologic: CN 2-12 grossly intact. Sensation intact, DTR normal. Strength 5/5 x all 4 extremities.  Ambulates independently ?Psychiatric: Alert and oriented x 3.  Very mild short-term memory deficits ?  ?  ?Data Reviewed: ?Prior notes reviewed, nursing notes reviewed, vital signs reviewed. ?No new labs ?  ?Family Communication:  ?  ?Disposition: ?Status is:  Inpatient ?Remains inpatient appropriate because:  ?Patient requires prolonged course of IV antibiotics, however he currently has no home, and has no identification or access to his money to procure housing.  In the

## 2022-02-23 NOTE — Progress Notes (Signed)
Mobility Specialist Progress Note: ? ? 02/23/22 0956  ?Mobility  ?Activity Ambulated with assistance in hallway  ?Level of Assistance Independent  ?LUE Weight Bearing NWB  ?Distance Ambulated (ft) 570 ft  ?Activity Response Tolerated well  ?$Mobility charge 1 Mobility  ? ?Pt received in chair willing to participate in mobility. Complaints of 8/10 pain  in both shoulders and in lower back. Left in chair with call bell in reach and all needs met.  ? ?Jeremiah [name] ?Mobility Specialist ?Primary Phone [phone] ? ?

## 2022-02-23 NOTE — Progress Notes (Signed)
Speech Language Pathology Treatment: Cognitive-Linquistic  ?Patient Details ?Name: Jeremiah [name] ?MRN: [id number] ?DOB: May 27, 1951 ?Today's Date: 02/23/2022 ?Time: 1130-1150 ?SLP Time Calculation (min) (ACUTE ONLY): 20 min ? ?Assessment / Plan / Recommendation ?Clinical Impression ? Patient seen by SLP for skilled treatment focused on cognition. Patient had just returned from walking in the hall with the mobility specialist and reported some mild soreness in his left shoulder. SLP read recent OT note stating patient unable to recall learned exercises and when SLP asked patient, he demonstrated just one. Patient said that plan was for him to get the spacer removed from his shoulder but he didnt know when this was going to occur. SLP read in MD note that patient still needs to go through his IV antibiotics and per RN his last day of that is 4/11. Patient was not oriented to date, wondering aloud if it was the "10th". SLP told patient that next Marlana Latus is the 12th and today is Wednesday and although patient at first said he was not good with math, he was then able to determine that today was the 5th. SLP provided patient with a paper calendar and some extra paper and encouraged him to take some notes of events as well as questions he has. Patient continues to not have a concrete plan and in addition, he does not demonstrate any motivation to do any of the things he says he will do. For instance, patient lost his debit card, his license was taken/expired?, and he reported that he was going to call a friend about an apartment. Patient did not seem concerned or questioning when he would be discharged. SLP suspects that patient is at or near his baseline cognitive functioning and although he would benefit from assistance with more complex ADL's (financial/money management, medication management), do not anticipate further benefit from skilled SLP intervention. SLP to s/o at this time. ?  ?HPI HPI: Pt is a 71 y/o M  presenting to ED on 2/25 wtih L shoulder wound with pain radiating to chest. Found to have L shoulder anteromedial dislocation after fall 3 months ago. S/p removal of reverse shoulder implant with antibiotic spacer on 2/28. PMH includes CAD s/p bypass, hx of PE and DVT on eliquis, HTN and alcohol abuse. MD with concern for possible degree of Wernicke's encephalopathy and/or mild dementia in context of chronic alcoholism, prompting SLP cognitive evaluation order. ?  ?   ?SLP Plan ? All goals met ? ?  ?  ?Recommendations for follow up therapy are one component of a multi-disciplinary discharge planning process, led by the attending physician.  Recommendations may be updated based on patient status, additional functional criteria and insurance authorization. ?  ? ?Recommendations  ?   ?   ?    ?   ? ? ? ? Follow Up Recommendations: No SLP follow up ?Assistance recommended at discharge: PRN ?SLP Visit Diagnosis: Cognitive communication deficit (R41.841) ?Plan: All goals met ? ? ? ? ?  ?  ? ? ?Sonia Baller, MA, CCC-SLP ?Speech Therapy ? ?

## 2022-02-24 DIAGNOSIS — Z5900 Homelessness unspecified: Secondary | ICD-10-CM | POA: Diagnosis not present

## 2022-02-24 DIAGNOSIS — T8450XD Infection and inflammatory reaction due to unspecified internal joint prosthesis, subsequent encounter: Secondary | ICD-10-CM | POA: Diagnosis not present

## 2022-02-24 DIAGNOSIS — F1010 Alcohol abuse, uncomplicated: Secondary | ICD-10-CM | POA: Diagnosis not present

## 2022-02-24 DIAGNOSIS — I5022 Chronic systolic (congestive) heart failure: Secondary | ICD-10-CM | POA: Diagnosis not present

## 2022-02-24 LAB — COMPREHENSIVE METABOLIC PANEL
ALT: 17 U/L (ref 0–44)
AST: 25 U/L (ref 15–41)
Albumin: 2.7 g/dL — ABNORMAL LOW (ref 3.5–5.0)
Alkaline Phosphatase: 92 U/L (ref 38–126)
Anion gap: 8 (ref 5–15)
BUN: 29 mg/dL — ABNORMAL HIGH (ref 8–23)
CO2: 27 mmol/L (ref 22–32)
Calcium: 9 mg/dL (ref 8.9–10.3)
Chloride: 105 mmol/L (ref 98–111)
Creatinine, Ser: 0.96 mg/dL (ref 0.61–1.24)
GFR, Estimated: >60 mL/min (ref >60)
Glucose, Bld: 105 mg/dL — ABNORMAL HIGH (ref 70–99)
Potassium: 3.9 mmol/L (ref 3.5–5.1)
Sodium: 140 mmol/L (ref 135–145)
Total Bilirubin: 0.6 mg/dL (ref 0.3–1.2)
Total Protein: 6.2 g/dL — ABNORMAL LOW (ref 6.5–8.1)

## 2022-02-24 LAB — VANCOMYCIN, TROUGH: Vancomycin Tr: 17 ug/mL (ref 15–20)

## 2022-02-24 MED ORDER — ENALAPRIL MALEATE 2.5 MG PO TABS
2.5000 mg | ORAL_TABLET | Freq: Every day | ORAL | Status: DC
Start: 2022-02-24 — End: 2022-02-28
  Administered 2022-02-24 – 2022-02-28 (×5): 2.5 mg via ORAL
  Filled 2022-02-24 (×5): qty 1

## 2022-02-24 MED ORDER — VANCOMYCIN HCL IN DEXTROSE 1-5 GM/200ML-% IV SOLN
1000.0000 mg | INTRAVENOUS | Status: AC
  Administered 2022-02-25 – 2022-03-01 (×5): 1000 mg via INTRAVENOUS
  Filled 2022-02-24 (×5): qty 200

## 2022-02-24 NOTE — Progress Notes (Signed)
Mobility Specialist Progress Note: ? ? 02/24/22 0956  ?Mobility  ?Activity Ambulated with assistance in hallway  ?Level of Assistance Independent  ?Assistive Device None  ?Distance Ambulated (ft) 570 ft  ?Activity Response Tolerated well  ?$Mobility charge 1 Mobility  ? ?Pt received in chair willing to participate in mobility. Complaints of shoulder pain. Required 1 seated rest break. Left in chair with call bell in reach and all needs met.  ? ?Estephanie Hubbs ?Mobility Specialist ?Primary Phone [phone] ? ?

## 2022-02-24 NOTE — Care Management Important Message (Signed)
Important Message ? ?Patient Details  ?Name: Jeremiah [name] ?MRN: [id number] ?Date of Birth: 03-04-1951 ? ? ?Medicare Important Message Given:  Yes ? ? ? ? ?[name]  Chukwuemeka Artola-Martin ?02/24/2022, 1:22 PM ?

## 2022-02-24 NOTE — Progress Notes (Signed)
Patient stable.  Left shoulder pain diminishing ?Incision intact ?Plan for thin cut CT scan in near future.  He is about 4 weeks out from his shoulder infection.  CT scan will let us know feasibility of reimplantation if and only if infection can be ruled out. ?

## 2022-02-24 NOTE — Progress Notes (Signed)
OT Cancellation Note ? ?Patient Details ?Name: Jeremiah [name] ?MRN: [id number] ?DOB: 02/14/1951 ? ? ?Cancelled Treatment:    Reason Eval/Treat Not Completed: Pain limiting ability to participate Pt requesting pain meds at this time. Will follow-up for OT treatment as schedule permits. ? ? ?Layla Maw ?02/24/2022, 9:13 AM ?

## 2022-02-24 NOTE — Progress Notes (Signed)
Occupational Therapy Treatment ?Patient Details ?Name: Jeremiah [name] ?MRN: [id number] ?DOB: 1951/10/07 ?Today's Date: 02/24/2022 ? ? ?History of present illness Pt is a 71 y/o M presenting to ED on 2/25 wtih L shoulder wound with pain radiating to chest. Found to have L shoulder anteromedial dislocation after fall 3 months ago. S/p removal of reverse shoulder implant with antibiotic spacer on 2/28. PMH includes CAD s/p bypass, hx of PE and DVT on eliquis, HTN and alcohol abuse. ?  ?OT comments ? Session focused on recall of shoulder precautions, compensatory strategies for UB ADL, and problem solving potential barriers to discharge with implementation of strategies d/t memory deficits. Additional handout provided for hand, wrist, and elbow exercises with good demonstration. However, pt appeared unaware of the recommendations to avoid any ROM of L shoulder - reinforced this based on previous encounters. See ADL section for details on discussions of functional barriers for discharge. Updated OT goals accordingly with addition of functional cognitive tasks to narrow down the assist likely needed at DC.  ? ?Recommendations for follow up therapy are one component of a multi-disciplinary discharge planning process, led by the attending physician.  Recommendations may be updated based on patient status, additional functional criteria and insurance authorization. ?   ?Follow Up Recommendations ? Home health OT  ?  ?Assistance Recommended at Discharge Set up Supervision/Assistance  ?Patient can return home with the following ? Assistance with cooking/housework;Direct supervision/assist for financial management;Assist for transportation;A little help with bathing/dressing/bathroom;Direct supervision/assist for medications management ?  ?Equipment Recommendations ? None recommended by OT  ?  ?Recommendations for Other Services   ? ?  ?Precautions / Restrictions Precautions ?Precautions: Shoulder ?Shoulder Interventions: Shoulder  sling/immobilizer;At all times ?Precaution Comments: L shoulder precautions ?Required Braces or Orthoses: Sling ?Restrictions ?Weight Bearing Restrictions: Yes ?LUE Weight Bearing: Non weight bearing ?Other Position/Activity Restrictions: sling on at all times except for dressing/exercise, AROM of hand/wrist/elbow ok, no shoulder AROM/PROM per MD  ? ? ?  ? ?Mobility Bed Mobility ?  ?  ?  ?  ?  ?  ?  ?General bed mobility comments: pt in chair on arrival ?  ? ?Transfers ?  ?  ?  ?  ?  ?  ?  ?  ?  ?  ?  ?  ?Balance   ?  ?  ?  ?  ?  ?  ?  ?  ?  ?  ?  ?  ?  ?  ?  ?  ?  ?  ?   ? ?ADL either performed or assessed with clinical judgement  ? ?ADL Overall ADL's : Needs assistance/impaired ?  ?  ?  ?  ?  ?  ?  ?  ?  ?  ?  ?  ?  ?  ?  ?  ?  ?  ?  ?General ADL Comments: Emphasis on strategies for UB ADLs with pt able to problem solve donning L UE first without cues. reinforced sling use - pt reports he hand washed it and hung it up to dry. Pt hyperverbose about difficulty finding a place to stay, how he lost his driver's license. Pt reported calling bank today to obtain access to his money to pay money owed to an apartment though the phone rep asked him for info he did not have. When asked, pt unable to specify what info they were asking. Educated pt to call again when ready, ask for specific info needed and to write it down  so CM/SW can further assist d/t pt reported memory deficits. Emphasis on problem solving strategies for med mgmt (reports needs new pill box) with plans to further reassess ?  ? ?Extremity/Trunk Assessment Upper Extremity Assessment ?Upper Extremity Assessment: LUE deficits/detail ?LUE Deficits / Details: hand/wrist ROM WFL, elbow flexion/extension AROM/PROM 75%, supination ~75% ?LUE Coordination: decreased gross motor ?  ?Lower Extremity Assessment ?Lower Extremity Assessment: Defer to PT evaluation ?  ?  ?  ? ?Vision   ?Vision Assessment?: No apparent visual deficits ?  ?Perception   ?  ?Praxis   ?   ? ?Cognition Arousal/Alertness: Awake/alert ?Behavior During Therapy: [facility] for tasks assessed/performed ?Overall Cognitive Status: No family/caregiver present to determine baseline cognitive functioning ?Area of Impairment: Memory, Problem solving ?  ?  ?  ?  ?  ?  ?  ?  ?  ?  ?Memory: Decreased short-term memory ?  ?  ?  ?Problem Solving: Requires verbal cues, Difficulty sequencing ?  ?  ?  ?   ?Exercises Exercises: General Upper Extremity ?General Exercises - Upper Extremity ?Elbow Flexion: Left, AROM, Seated, 5 reps ?Elbow Extension: AROM, Left, Seated, 5 reps ?Wrist Flexion: AROM, Left, Seated, 5 reps ?Wrist Extension: AROM, Left, Seated, 5 reps ?Digit Composite Flexion: AROM, Left, Seated, 5 reps ?Composite Extension: AROM, Left, Seated, 5 reps ? ?  ?Shoulder Instructions   ? ? ?  ?General Comments Pt also reports not being aware of recommendation to not move L shoulder - educated on most recent therapy notes, order sets, etc to not move shoulder though secure chat sent to ortho MD to confirm  ? ? ?Pertinent Vitals/ Pain       Pain Assessment ?Pain Assessment: Faces ?Faces Pain Scale: Hurts a little bit ?Pain Location: left shoulder ?Pain Descriptors / Indicators: Sore ?Pain Intervention(s): Monitored during session, Premedicated before session ? ?Home Living   ?  ?  ?  ?  ?  ?  ?  ?  ?  ?  ?  ?  ?  ?  ?  ?  ?  ?  ? ?  ?Prior Functioning/Environment    ?  ?  ?  ?   ? ?Frequency ? Min 1X/week  ? ? ? ? ?  ?Progress Toward Goals ? ?OT Goals(current goals can now be found in the care plan section) ? Progress towards OT goals: Progressing toward goals ? ?Acute Rehab OT Goals ?Patient Stated Goal: get info from bank to get my money and my own place ?OT Goal Formulation: With patient ?Time For Goal Achievement: 03/10/22 ?Potential to Achieve Goals: Good  ?Plan Discharge plan remains appropriate;Frequency needs to be updated   ? ?Co-evaluation ? ? ?   ?  ?  ?  ?  ? ?  ?AM-PAC OT "6 Clicks" Daily Activity     ?Outcome  Measure ? ? Help from another person eating meals?: None ?Help from another person taking care of personal grooming?: None ?Help from another person toileting, which includes using toliet, bedpan, or urinal?: None ?Help from another person bathing (including washing, rinsing, drying)?: None ?Help from another person to put on and taking off regular upper body clothing?: A Little ?Help from another person to put on and taking off regular lower body clothing?: A Little ?6 Click Score: 22 ? ?  ?End of Session   ? ?OT Visit Diagnosis: Unsteadiness on feet (R26.81);Other abnormalities of gait and mobility (R26.89);Muscle weakness (generalized) (M62.81);Pain ?Pain - Right/Left: Left ?Pain - part of  body: Shoulder ?  ?Activity Tolerance Patient tolerated treatment well ?  ?Patient Left in chair;with call bell/phone within reach ?  ?Nurse Communication   ?  ? ?   ? ?Time: 4010-2725 ?OT Time Calculation (min): 26 min ? ?Charges: OT General Charges ?$OT Visit: 1 Visit ?OT Treatments ?$Self Care/Home Management : 8-22 mins ?$Therapeutic Activity: 8-22 mins ? ?Malachy Chamber, OTR/L ?Acute Rehab Services ?Office: [phone]  ? ?Layla Maw ?02/24/2022, 12:23 PM ?

## 2022-02-24 NOTE — Progress Notes (Signed)
Pharmacy Antibiotic Note ? ?Jeremiah [name] is a 71 y.o. male admitted on 01/15/2022 with  MRSA L shoulder joint infection .  Pharmacy has been consulted for Vancomycin dosing. ?  ?VT 17 drawn correctly and trending up slightly on '1250mg'$  Q 24 hr. Scr stable. Will target lower trough 10-15 given less than 1 week of treatment remaining and continued risk for AKI. Estimated trough on '1000mg'$  q24 hr is ~13.  ? ?Plan: ?Decrease to Vancomycin 1000 MG IV Q24H until 4/11 (6 wks) ?No further levels needed unless renal function changes before end date ? ?Height: '5\' 7"'$  (170.2 cm) ?Weight: 87.1 kg (192 lb 0.3 oz) ?IBW/kg (Calculated) : 66.1 ? ?Temp (24hrs), Avg:97.9 ?F (36.6 ?C), Min:97.5 ?F (36.4 ?C), Max:98.3 ?F (36.8 ?C) ? ?Recent Labs  ?Lab 02/18/22 ?3419 02/18/22 ?6222 02/19/22 ?9798 02/20/22 ?0757 02/21/22 ?9211 02/24/22 ?0800 02/24/22 ?0917  ?WBC 4.8  --   --   --   --   --   --   ?CREATININE 0.99  --  1.04 0.85 0.82 0.96  --   ?[place]  --  16  --   --   --   --  17  ?  ?Estimated Creatinine Clearance: 76.5 mL/min (by C-G formula based on SCr of 0.96 mg/dL).   ? ?Allergies  ?Allergen Reactions  ? Morphine Hives, Swelling and Rash  ?  Has taken with no reaction since original reaction  ? ? ?Antimicrobials this admission: ?Vanc 2/26 >> 4/11 ?Doxy PTA 2/14 >> 2/25 ? ?Dose adjustments this admission: ?3/3 VP/VT 26/18, AUC 558 on '750mg'$  q12 ?3/9 VP/VT 31/20, AUC 618 >> change to '1250mg'$  q24 ?3/11 SCr bump >>change to '1000mg'$  q24 ?3/14 SCr downt o 0.85>>back to 1250 mg q24h ?3/17 VP/VT 29/14, cAUC 534 on 1250 mg q24h ?3/27 VT 15 - continued 1250 q24 ?3/31 SCr trending up again, VT up 16 - continue for now based on prior levels but if further SCr increase, will adjust down dose  ?4/6 VT 17 - decrease to '1000mg'$  q 24 hr ? ?Microbiology: ?2/26 MRSA PCR - positive ?2/27 abscess - MRSA ?2/28 L joint fluid, swab A - negative ?2/28 L joint fluid, swab B - negative ?2/28 L joint fluid, swab C - negative ? ?Thank you for allowing  pharmacy to be a part of this patient?s care. ? ?Benetta Spar, PharmD, BCPS, BCCP ?Clinical Pharmacist ? ?Please check AMION for all [facility] phone numbers ?After 10:00 PM, call [place] [phone] ? ?

## 2022-02-24 NOTE — Progress Notes (Signed)
?Progress Note ?  ?  ?Patient: Jeremiah [name] WJX:[id number] DOB: 30-May-1951 DOA: 01/15/2022     33 ?DOS: the patient was seen and examined on 02/17/2022 ?  ?  ?  ?  ?Brief hospital course: ?See previous extensive summary by Dr. Louanne Belton on 02/02/22 ?  ?In short: 71 y.o. M with CAD s/p CABG 98, PE on Eliquis, HTN, chronic pain and opiate dependence previously on Suboxone who presented with shoulder pain.  ?  ?Had been living in [place] for several years, alcohol dependent, at some point had a fall and hematoma of the left chest, that ultimately must have gotten infected because he got a debridement and wound vac.  In the context of that treatment was found to have a chronically dislocated shoulder, he had a prosthetic shoulder replacement in [place].   ?  ?In that context, he moved back to [place], and unfortunately, literally got off the bus with severe pain in his shoulder, came straight here and was found to have a MRSA infection that had entered the prosthetic joint. ?  ?Hospital stay complicated by homelessness. ?  ?  ?  ?  ?Assessment and Plan: ?*Prosthetic shoulder infection ?- Continue vancomycin until 4/11, then PO per ID ?-Patient will likely require removal of antibiotic spacer before discharge and after current antibiotic regimen is completed ?  ?Chronic combined systolic CHF (congestive heart failure) ([place]) ?Moderate to severe aortic regurgitation on prior echo now mild ?Aortic root 4.27 cm ?Euvolemic, without symptoms ?EF on echocardiogram this admission 35 to 40% ?No beta-blocker secondary to chronic trifascicular block ?Low-dose Lasix discontinued in favor of spironolactone since this is a potassium sparing medication and patient has been having difficulty swallowing potassium tab ?On 4/5 progressive increase in bilateral lower extremity edema.  LLE secondary to prior trauma.  RLE appears to be consistent with volume overload.  Weights have been inconsistent. ?Was given IV Lasix 60 mg on 4/5 with  significant diuresis.  Patient stated did not always measure urines and went frequently to the bathroom and flushed urine down the toilet on 4/5.  On 4/6 significant improvement and RLE edema and pitting has resolved ?SBP in the 140s therefore will attempt to initiate low-dose ACE inhibitor; anticipate will likely need to increase dosage of spironolactone ?Follow labs ?  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  Patient exhibited same symptoms down there but memory loss and inability to function appeared to be selective.  Patient had no trouble navigating outside of the home and obtaining alcohol, spending his Social Security check irresponsibly and utilizing methamphetamine. ? ?**Additional history obtained by case management from patient's family.  Please refer to her note for details.  Given this information patient is not an appropriate candidate for a long-acting narcotic substitute such as Suboxone.  Discussed this with my attending physician who agrees.  If patient does undergo surgical procedure prior to discharge plan is to transition over to Oxy IR otherwise if no surgical procedure planned with assistance of pharmacy team will need to transition back to Oxy IR with no more than four 10 mg doses per day.  Patient will receive a limited supply of Oxy IR at discharge and will have no refills. ?  ?  ?Iron deficiency anemia due to chronic blood loss ?Iron indices low.  Hgb stable. Given IV iron. ?- Start oral iron ?  ?  ?CAD (coronary artery disease) ?CAD s/p cabg x4 1998 ?Chronic trifascicular block ?Hyperlipidemia ?Avoid AV nodal blockade ?Continue aspirin and Crestor ?  ?Chronic back pain, spinal stenosis with ? opiate dependence and  history of substance use disorder ?According to information obtained directly from the patient he states he was not in a pain clinic but apparently was obtaining Suboxone previously from PCP although documentation seems to reflect patient did go to a Suboxone clinic before this was weaned and discontinued ?Postoperatively patient was on a high dose of Oxy IR i.e. 10 mg 6 times a day to equal 60 mg ?Dr. Loleta Books had a lengthy discussion with him and plan was to transition to Suboxone which patient is currently tolerating well ?Once surgery decision made by the orthopedic team and definite discharge date in place we will contact Suboxone clinic and let them know the patient needs to follow-up.  They will contact patient and give him a number so he can call and arrange for an intake exam. ?Continue tizanidine, gabapentin, Wellbutrin ?Patient may require short-term use of oxy IR in the immediate postoperative timeframe but will once again be transitioned back to Suboxone.  May need to consult pharmacist for assistance ? ?Homelessness ?Patient apparently came up here by bus without a plan.  During this transition he lost his wallet including his driver's license ?During previous admissions while patient lived in the [place] area photocopies of his license were obtained and scanned into the system and he has been given a Engineer, materials here ?Patient has been given contact information regarding the Social Security office and he is yet to contact them to notify them he has moved to the [place] area.  According to the brother the patient receives over $3000 in retirement benefits.  At this point it is up to the patient to make sure this occurs.  If he has not contacted so security and confirm and obtain monies prior to discharge unfortunately we will need to discharge patient to [facility] ?Case management working closely with patient's prior charts to see if they will be of some assistance in regards to discharge plan and  assisting patient ?  ?Anxiety ?Do not prescribe benzodiazepines in combination with Suboxone ?Continue hydroxyzine, Wellbutrin, Buspar (buspar and wellbutrin at max doses) ?Clonazepam has been tapered and discontinuation soon ?  ? ?  ?Subjective:  ?Laying in bed.  IV team changing PICC line dressing.  No complaints verbalized. ?  ?Physical Exam: ?      ?Vitals:  ?  02/16/22 0504 02/16/22 1944 02/17/22 0448 02/17/22 0600  ?BP: 114/64 (!) 105/44 (!) 107/56    ?Pulse: 73 81 81    ?Resp:   18 18    ?Temp: 99.6 ?F (37.6 ?C) 98.4 ?F (36.9 ?C) 99.2 ?F (37.3 ?C)    ?TempSrc: Oral Oral Oral    ?SpO2: 98% 97% 98%    ?Weight:       82 kg  ?Height:          ?  ?  ?Constitutional: NAD, calm, comfortable ?Respiratory: clear to auscultation bilaterally, no wheezing, no crackles. Normal respiratory effort.  Room air ?Cardiovascular: Regular rate and rhythm, no murmurs / rubs / gallops.  Slightly worse in LLE edema which is chronic and pitting.  Progressive and somewhat new RLE edema pitting 2-3+.  Normotensive ?Abdomen: no tenderness, no masses palpated. No hepatosplenomegaly. Bowel sounds positive.  LBM 4/3 ?Neurologic: CN 2-12 grossly intact.  Sensation intact, DTR normal. Strength 5/5 x all 4 extremities.  Ambulates independently ?Psychiatric: Alert and oriented x 3.  Very mild short-term memory deficits ?  ?  ?Data Reviewed: ?Prior notes reviewed, nursing notes reviewed, vital signs reviewed. ?No new labs ?  ?Family Communication:  ?  ?Disposition: ?Status is: Inpatient ?Remains inpatient appropriate because:  ?Patient requires prolonged course of IV antibiotics, however he currently has no home, and has no identification or access to his money to procure housing.  In the setting of alcohol abuse and having been seen twice in the emergency room in [place] within the month prior to admission for alcohol abuse, altered mental status, and head injury it would be unsafe to discharge him on oral antibiotics at this time and  potentially limb threatening in the context of a prosthetic joint infection ?  ?  ?Planned Discharge Destination:  ?Current unsafe discharge plan.  Patient currently homeless and requires IV antibiotics through 4/11.

## 2022-02-24 NOTE — TOC Progression Note (Addendum)
Transition of Care (TOC) - Progression Note  ? ? ?Patient Details  ?Name: Jeremiah [name] ?MRN: [id number] ?Date of Birth: [birth year] ? ?Transition of Care (TOC) CM/SW Contact  ?Curlene Labrum, RN ?Phone Number: ?02/24/2022, 10:43 AM ? ?Clinical Narrative:    ?CM met with the patient at the bedside to continue discussion regarding safe discharge plan for the patient.  The patient states that he has a cell phone but no service at this time.  The patient has made no attempt to contact church members, old Radiographer, therapeutic or family/friends to provide assistance.  I spoke with the patient in depth to discuss possible need for ALF placement since the patient is currently homeless and unable to obtain access to obtaining ID, medications, transportation, groceries not housing.   I spoke with the patient's family, including brother and sister-in-law on the phone and they are unwilling to assist the patient and refuse patient's return to their home for care.  The family previous bought a bus ticket for the patient from [place] to [place], in full knowledge that the patient lacked ID or ability to provide for housing or care. ? ?I spoke with Anitha, CM with [facility] facility and she is willing to speak with the patient regarding placement at her facility for care.  Clinicals were emailed to her this morning.  I spoke with the patient at the bedside and he is agreeable to speak with her about admission to the facility - which will be through private pay funding.  I explained to the patient that due to his mild memory impairment that this would be a reasonable discharge plan - otherwise the patient may be sent to [facility] homeless shelter. ? ?02/24/2022 1442 - Called 6 N - Bedside nursing notified that [place] with [facility] was coming to visit with the patient. ? ?CM and MSW with DTP Team will continue to follow the patient for discharge planning. ? ? ?Expected Discharge Plan: [name] ?Barriers to Discharge: Homeless with medical needs ? ?Expected Discharge Plan and Services ?Expected Discharge Plan: [name] ?In-house Referral: Clinical Social Work ?Discharge Planning Services: CM Consult ?  ?Living arrangements for the past 2 months: [place] (Patient was living with his brother/wife in [place] prior to admission to [facility]) ?                ?  ?  ?  ?  ?  ?  ?  ?  ?  ?  ? ? ?Social Determinants of Health (SDOH) Interventions ?  ? ?Readmission Risk Interventions ? ?  02/21/2022  ? 11:53 AM 02/18/2022  ?  1:08 PM  ?Readmission Risk Prevention Plan  ?Transportation Screening Complete Complete  ?Medication Review Press photographer) Complete Complete  ?PCP or Specialist appointment within 3-5 days of discharge Complete Complete  ?[place] or Home Care Consult Complete Complete  ?SW Recovery Care/Counseling Consult Complete Complete  ?Palliative Care Screening Not Applicable Not Applicable  ?[place] Not Applicable   ? ? ?

## 2022-02-25 DIAGNOSIS — F1010 Alcohol abuse, uncomplicated: Secondary | ICD-10-CM | POA: Diagnosis not present

## 2022-02-25 DIAGNOSIS — T8450XD Infection and inflammatory reaction due to unspecified internal joint prosthesis, subsequent encounter: Secondary | ICD-10-CM | POA: Diagnosis not present

## 2022-02-25 DIAGNOSIS — I5022 Chronic systolic (congestive) heart failure: Secondary | ICD-10-CM | POA: Diagnosis not present

## 2022-02-25 LAB — BASIC METABOLIC PANEL
Anion gap: 6 (ref 5–15)
BUN: 30 mg/dL — ABNORMAL HIGH (ref 8–23)
CO2: 25 mmol/L (ref 22–32)
Calcium: 9 mg/dL (ref 8.9–10.3)
Chloride: 108 mmol/L (ref 98–111)
Creatinine, Ser: 0.96 mg/dL (ref 0.61–1.24)
GFR, Estimated: >60 mL/min (ref >60)
Glucose, Bld: 102 mg/dL — ABNORMAL HIGH (ref 70–99)
Potassium: 4 mmol/L (ref 3.5–5.1)
Sodium: 139 mmol/L (ref 135–145)

## 2022-02-25 NOTE — Progress Notes (Signed)
?Progress Note ?  ?  ?Patient: Jeremiah [name] ZOX:[id number] DOB: 1951/10/15 DOA: 01/15/2022     33 ?DOS: the patient was seen and examined on 02/17/2022 ?  ?  ?  ?  ?Brief hospital course: ?See previous extensive summary by Dr. Louanne Belton on 02/02/22 ?  ?In short: 71 y.o. M with CAD s/p CABG 98, PE on Eliquis, HTN, chronic pain and opiate dependence previously on Suboxone who presented with shoulder pain.  ?  ?Had been living in [place] for several years, alcohol dependent, at some point had a fall and hematoma of the left chest, that ultimately must have gotten infected because he got a debridement and wound vac.  In the context of that treatment was found to have a chronically dislocated shoulder, he had a prosthetic shoulder replacement in [place].   ?  ?In that context, he moved back to , and unfortunately, literally got off the bus with severe pain in his shoulder, came straight here and was found to have a MRSA infection that had entered the prosthetic joint. ?  ?Hospital stay complicated by homelessness. ?  ?  ?  ?  ?Assessment and Plan: ?*Prosthetic shoulder infection ?- Continue vancomycin until 4/11, then PO per ID ?-Dr. Marlou Sa documented on the afternoon of 4/6 that patient will need a CT scan in the near future to let the team know whether it is feasible to perform a reimplantation procedure of another prostatic joint.  And only if infection can be ruled out.   ?-I contacted ID who stated no additional antibiotics needed after the 6 weeks of IV antibiotics are completed on 4/11. ?-I contacted Dr. Marlou Sa to find out if he wanted me to order a CT on 4/10 ?  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  LLE secondary to prior trauma.  RLE appears to be consistent with volume overload.  Weights have been inconsistent. ?Was given IV Lasix 60 mg on 4/5 with significant diuresis.  Patient stated did not always measure urines and went frequently to the bathroom and flushed urine down the toilet on 4/5.  On 4/6 significant improvement and RLE edema and pitting has resolved ?/6 low-dose and labral initiated and BP tolerating thus far; anticipate will likely need to increase dosage of spironolactone ?Follow labs ?  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  Patient exhibited same symptoms down there but memory loss and inability to function appeared to be selective.  Patient had no trouble navigating outside of the home and obtaining alcohol, spending his Social Security check irresponsibly and utilizing methamphetamine. ? ?**Additional history obtained by case management from patient's family.  Please refer to her note for details.  Given this information patient is not an appropriate candidate for a long-acting narcotic substitute such as Suboxone.  Discussed this with my attending physician who agrees.  If patient does undergo surgical procedure prior to discharge plan is to  transition over to Oxy IR otherwise if no surgical procedure planned with assistance of pharmacy team will need to transition back to Oxy IR with no more than four 10 mg doses per day.  Patient will receive a limited supply of Oxy IR at discharge and will have no refills. ?  ?  ?Iron deficiency anemia due to chronic blood  loss ?Iron indices low.  Hgb stable. Given IV iron. ?- Start oral iron ?  ?  ?CAD (coronary artery disease) ?CAD s/p cabg x4 1998 ?Chronic trifascicular block ?Hyperlipidemia ?Avoid AV nodal blockade ?Continue aspirin and Crestor ?  ?Chronic back pain, spinal stenosis with ? opiate dependence and history of substance use disorder ?According to information obtained directly from the patient he states he was not in a pain clinic but apparently was obtaining Suboxone previously from PCP although documentation seems to reflect patient did go to a Suboxone clinic before this was weaned and discontinued ?Postoperatively patient was on a high dose of Oxy IR i.e. 10 mg 6 times a day to equal 60 mg ?Dr. Loleta Books had a lengthy discussion with him and plan was to transition to Suboxone which patient is currently tolerating well ?Once surgery decision made by the orthopedic team and definite discharge date in place we will contact Suboxone clinic and let them know the patient needs to follow-up.  They will contact patient and give him a number so he can call and arrange for an intake exam. ?Continue tizanidine, gabapentin, Wellbutrin ?Patient may require short-term use of oxy IR in the immediate postoperative timeframe but will once again be transitioned back to Suboxone.  May need to consult pharmacist for assistance ? ?Homelessness ?Patient apparently came up here by bus without a plan.  During this transition he lost his wallet including his driver's license ?During previous admissions while patient lived in the [place] area photocopies of his license were obtained and scanned into the system and he has been given a Engineer, materials here ?Patient has been given contact information regarding the Social Security office and he is yet to contact them to notify them he has moved to the [place] area.  According to the brother the patient receives over $3000 in retirement benefits.  At this point it is up to the patient to make sure  this occurs.  If he has not contacted so security and confirm and obtain monies prior to discharge unfortunately we will need to discharge patient to [facility] ?Case management working closely with patient's prior charts to see if they will be of some assistance in regards to discharge plan and assisting patient ?  ?Anxiety ?Do not prescribe benzodiazepines in combination with Suboxone ?Continue hydroxyzine, Wellbutrin, Buspar (buspar and wellbutrin at max doses) ?Clonazepam has been tapered and discontinuation soon ?  ? ?  ?Subjective:  ?Alert.  Updated on Dr. Randel Pigg plans to perform a follow-up CT before he can determine whether surgery is indicated.  I told him this potentially could occur after discharge as an outpatient.  Patient became extremely frustrated and somewhat angry immediately stating "I have nowhere to go".  I reminded patient that we are looking for placement in an ALF to which point he responded "I do not have $5000 a month for the ALF that said I could go there".  I told him that case management is looking to find him another ALF that is within the budget for his retirement check.  He then responded "I have worked all my life and I do not  see why I should have to give all my money to a business".  I reminded the patient that is not just a business that he would be provided a place to stay, food, activities, a nurse to help him with his medications and other healthcare issues as well as transportation to any outside doctors appointments.  He began focusing on the fact that he does not have a driver's license anymore.  I told the patient that based on what case management was telling me that if he is ready for discharge and cannot find an accepting ALF/1 within his budget or he refused placement at an ALF that our only option would be to discharge him to [facility] and reminded him that when he came to [place] he did not have any plans in place and did not have housing in place.  I told him we could just not  keep him in the hospital just because he did not have a place to go if he were otherwise medically stable.  Case management made aware of the above concerns. ?  ?Physical Exam: ?      ?Vitals:  ?

## 2022-02-25 NOTE — TOC Progression Note (Addendum)
Transition of Care (TOC) - Progression Note  ? ? ?Patient Details  ?Name: Jeremiah [name] ?MRN: [id number] ?Date of Birth: June 21, 1951 ? ?Transition of Care (TOC) CM/SW Contact  ?Curlene Labrum, RN ?Phone Number: ?02/25/2022, 9:48 AM ? ?Clinical Narrative:    ?CM met with the patient at the bedside to discuss patient's discharge plans next week, once his IV antibiotics are completed on 03/01/2022.  Anitha, owner of [facility] met with the patient yesterday, 02/24/2022 at 1500 and offered the patient a bed at the facility but the patient was unwilling to accept the bed offer and states that he does not want to spend all of his money on the cost of the center.  The patient is aware that he lacks a photo ID, transportation and availability to bank funds at this time.  The patient states that he has called the [facility] in [place], but no one has returned his calls for assistance.  I called and left a message with the pastor on call at the church requesting contact with the patient if willing to speak with the patient or provide assistance.  The patient is aware that he will be discharged after 03/01/2022 after completion of IV antibiotics and is frustrated at his circumstances and lack of transportation. ? ?I spoke with the patient and asked the patient's permission to send referral to family care home  - and patient was agreeable to find less expensive facility in his budget.  Referral was placed with Earlean Polka - [phone] with [facility] and she is agreeable to interview the patient on Monday, 02/28/2022 for possible bed offer.  Patient's clinicals were reviewed by Shanda Bumps, CM at the facility yesterday. ? ?I called and left a message with the patient's brother, Gaynor Ferreras, in [place] to update after permission was granted from the patient. ? ?02/25/2022 1343- CM called and spoke with the patient's sister-in-law, Beverlee Nims and brother on the phone and they are planning to speak with  the patient and encourage him to reach out to the [employer] and consider admission to the offer assisted living facility  - [facility] at this time.  The patient's brother has the minister's personal cell number and has asked the church to speak with the patient and provide support.  I called and spoke with Anitha, owner of [facility] in [place] and she has offered a bed to the patient after his IV antibiotics for a safe discharge plan.  The patient has declined at this time but states that he will think about it.  Lissa Merlin, NP is aware of offered discharge plan to the patient if he is agreeable next week.  Bedside nursing was made aware. ? ?CM and MSW with DTP Team will continue to follow the patient for discharge planning needs. ? ? ?Expected Discharge Plan: [place] ?Barriers to Discharge: Homeless with medical needs ? ?Expected Discharge Plan and Services ?Expected Discharge Plan: [place] ?In-house Referral: Clinical Social Work ?Discharge Planning Services: CM Consult ?  ?Living arrangements for the past 2 months: [place] (Patient was living with his brother/wife in [place] prior to admission to [facility]) ?                ?  ?  ?  ?  ?  ?  ?  ?  ?  ?  ? ? ?Social Determinants of Health (SDOH) Interventions ?  ? ?Readmission Risk Interventions ? ?  02/21/2022  ?  11:53 AM 02/18/2022  ?  1:08 PM  ?Readmission Risk Prevention Plan  ?Transportation Screening Complete Complete  ?Medication Review Press photographer) Complete Complete  ?PCP or Specialist appointment within 3-5 days of discharge Complete Complete  ?[place] or Home Care Consult Complete Complete  ?SW Recovery Care/Counseling Consult Complete Complete  ?Palliative Care Screening Not Applicable Not Applicable  ?[place] Not Applicable   ? ? ?

## 2022-02-25 NOTE — Progress Notes (Signed)
Mobility Specialist Progress Note  ? ? 02/25/22 1224  ?Mobility  ?Activity Ambulated independently in hallway  ?Level of Assistance Modified independent, requires aide device or extra time  ?Assistive Device Other (Comment) ?(IV pole)  ?Distance Ambulated (ft) 570 ft  ?Activity Response Tolerated fair  ?$Mobility charge 1 Mobility  ? ?Pt received in chair and agreeable. No complaints on walk. Returned to chair with call bell in reach.   ? ?Upon return, pt stated he is sick to his stomach about his situation and would like his anxiety and pain med dosages increased. RN notified.  ? ?Hildred Alamin ?Mobility Specialist  ?  ?

## 2022-02-26 DIAGNOSIS — I5043 Acute on chronic combined systolic (congestive) and diastolic (congestive) heart failure: Secondary | ICD-10-CM | POA: Diagnosis not present

## 2022-02-26 DIAGNOSIS — T8450XD Infection and inflammatory reaction due to unspecified internal joint prosthesis, subsequent encounter: Secondary | ICD-10-CM | POA: Diagnosis not present

## 2022-02-26 DIAGNOSIS — I483 Typical atrial flutter: Secondary | ICD-10-CM | POA: Diagnosis not present

## 2022-02-26 DIAGNOSIS — F1010 Alcohol abuse, uncomplicated: Secondary | ICD-10-CM | POA: Diagnosis not present

## 2022-02-26 DIAGNOSIS — E876 Hypokalemia: Secondary | ICD-10-CM

## 2022-02-26 NOTE — Progress Notes (Signed)
PT Cancellation Note and Discharge ? ?Patient Details ?Name: Jeremiah [name] ?MRN: [id number] ?DOB: February 19, 1951 ? ? ?Cancelled Treatment:     New PT eval received, chart reviewed. Noted pt continues to be active with the [facility], ambulating 570' at a modified independent level without an AD (pushing IV pole). It does not appear that there are any new acute PT needs and pt can continue mobilizing with [facility] as able. Will sign off. If needs change, please reconsult.  ? ? ?Thelma Comp ?02/26/2022, 7:47 AM ? ?Rolinda Roan, PT, DPT ?Acute Rehabilitation Services ?Secure Chat Preferred ?Office: [phone] ? ?

## 2022-02-26 NOTE — Progress Notes (Signed)
?Progress Note ? ? ?Patient: GEORGIA BARIA VOH:[id number] DOB: [birth year] DOA: 01/15/2022     42 ?DOS: the patient was seen and examined on 02/26/2022 ?  ?Brief hospital course: ?Patient is a 71 y.o. male with medical history significant of CAD s/p bypass in 1998, Hx of PE and DVT on Eliquis, HTN, alcohol abuse with hx of withdrawal, opioid dependency previous on Suboxone presented to the hospital with left shoulder and neck pain.  Reportedly patient lost his wife last year and moved out to [place] to live with his brother. However did not get along with his sister-in-law and has recently moved back here to [place] l a week prior to presentation and noted to be homeless and living in [place]. Noted to have multiple admissions recently in [place] for alcohol withdrawal.  On 10/2021 he was admitted following a fall and was noted to have a large left-sided chest wall hematoma  had multiple I&D and wound VAC in January due to persistent refractory hematoma, found to have chronically dislocated left shoulder arthroplasty in the setting of likely infection.  On 02/06/2021-CTA showed large burden of thromboembolic disease with central pulmonary artery distribution.  Saw Pulm in office with Dr Janett Billow at [facility]- who noted 'it sounds like his pulmonary embolism was associated with some lower extremity cellulitis which he was hospitalized and minimally mobile. He was treated with Eliquis, now off.  In the ED on this admission patient was hemodynamically stable.  Labs showed some mild anemia of hemoglobin 9.1.  EKG showed atrial flutter.  Troponin was borderline.  Left shoulder x-ray showed anterior dislocation of left reverse total shoulder arthroplasty.  Patient had CTA chest on 01/16/2022 which showed no evidence of pulmonary embolism but pulmonary hypertension and cardiomegaly.  There was note of 4.7 cm thoracic aortic aneurysm.  Radiology recommended dysemia [place] CT or MRI follow-up and referral to  cardiothoracic surgery as outpatient.  Patient was also seen by orthopedics during hospitalization  and the  CT of left shoulder-showed postsurgical changes and dislocation, large periprosthetic fluid collection surrounding the proximal humerus and extending throughout the left axillary region up to 7.8 X4.4X 8.8-sequelae of postoperative seroma, hematoma versus particle disease.  Patient had 2D echocardiogram on 01/16/2022 which showed reduced LV function and cardiology was consulted.  On 01/17/2022 CT-guided biopsy of the left anterior axillary fluid was done with removal of 30 mils of cloudy fluid with.  Patient underwent removal of shoulder implant with excision of sinus tract antibiotic spacer placement and wound VAC placement by Dr. Marlou Sa on 01/18/2022.  On 01/19/2022 infectious disease was consulted who recommended prolonged IV antibiotic course. Patient has homelessness issues which has been a barrier for his disposition as well.  On 01/26/2022, patient was noted to be anemic and received 1 unit of packed RBC.  Otherwise, he has remained stable except for ongoing pain issues. ? ?Assessment and Plan: ?* Prosthetic joint infection ([place]) ?Presented with left shoulder wound.  ?Origin of this wound is a misadventure in [place] in which someone attempted to drain a hematoma. ? ?S/p left shoulder aspiration by IR on 2/27.  Or cultures MRSA ?S/p left shoulder excision of sinus tract, removal of reverse shoulder replacement, antibiotic spacer placement and placement of wound VAC.  Or cultures no growth with no organisms in gram stain ( final ).  ?  ?2. Left sided chest wall hematoma s/p multiple I and Ds ( MRSA) at [place] - stable  ?3. Alcohol abuse ?4. H/o Opioid dependence ?5.  Homelessness ?  ? He has h/o alcohol abuse and  twice seen in the ED in February ( [place]) for head injury and alcohol abuse and ams. It would be unsafe for him to discharge on PO antibiotics at this point given he does not have a  stable housing and this is a Prosthetic joint infection.  ? ?Vancomycin until 4/11, then PO per ID ?Tolerating well antibiotic therapy. ?Continue pain control with oxycodone, gabapentin, ibuprofen  ? 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 ?CT of the left shoulder showed fluid collection and underwent CT-guided biopsy on 01/17/2022 with removal of 30 mil of fluid status post removal of hardware, irrigation/debridement and placement of antibiotic spacer with 6-8 weeks of IV antibiotic.  Orthopedics and infectious disease followed the patient during hospitalization.  ? ?Patient reports joint pain to be well controlled.  ? ?Acute on chronic combined systolic and diastolic CHF (congestive heart failure) ([place]) ?Acute on chronic systolic CHF with EF 35 to 40% ?Moderate to severe aortic regurgitation on prior echo now mild ?Aortic root 4.27 cm ?Medical nonadherence ?Lower leg edema: ?Patient was supposed to be on Bumex which he ran out for several months.  Continue CHF protocol.  Seen by cardiology during this hospitalization.  Net IO Since Admission: -62,694.93 mL [02/03/22 1326]  On oral lasix daily low dose.  Appears compensated at this time. ? ?Continue with spironolactone.  ?Continue with enalapril for blood pressure control.  ? ?Alcohol abuse ?No withdrawal symptoms at this time..  Continue thiamine, folic acid. ? ?Iron deficiency anemia due to chronic blood loss ?Latest hemoglobin was 7.6 after 1 unit of packed RBC transfusion.   Continue vitamin B12.  Check CBC BMP in AM. ? ?CAD (coronary artery disease) ?CAD s/p cabg x4 1998 ?Chronic trifascicular block ?Hyperlipidemia ?Continue aspirin and statins.  Hold AV nodal blockers.  ? ?Wrist pain ?Left wrist pain.  Seen by orthopedics.   Uric acid of 4.4.   X-ray of the left wrist shows severe osteoarthritis.  Orthopedic recommended  3-day course of colchicine which he has completed.    His pain has improved at this time. ? ?Hypokalemia ?Renal function stable, with serum cr at 0,96, K is 4,0 and serum bicarbonate 25. ?Plan to continue follow up renal function in 48 hrs.  ? ?Back pain ?Chronic back pain, spinal stenosis.   Patient does have high tolerance to pain.  On pain medication regimen oxycodone every 4 hourly.  Continue Lidoderm patch, gabapentin 3 times daily and continue tizanidine. ? ?Protein-calorie malnutrition, severe ?Severe protein calorie malnutrition.   ?Present on admission.  Nutrition on board.  Continue supplements.  Encourage oral nutrition. ? ?Thoracic aortic aneurysm (TAA) ([place]) ?CTA incidentally showed a 4.7 cm thoracic aortic aneurysm, will need semiannual CT or MRI imaging/cardiothoracic surgery follow-up as outpatient ? ?Hypotension ? Not on nodal blockers.  Might need antihypertensive if continues to remain high but relatively stable at this time. ? ? ?B12 deficiency ?Continue vitamin B12 replacement. ? ?History of pulmonary embolism ?Completed treatment with Eliquis.  Not currently on anticoagulation due to risk of falls homelessness, history of hematoma, ongoing alcohol abuse. ? ?Depression ?Continue hydroxyzine, bupropion, buspirone. ?Continue with clonazepam for anxiety bid  ?On quetiapine.  ? ?Atrial flutter ([place]) ?New onset a flutter in the ED ?Asymptomatic bradycardia during the night 2/26: ?TSH was within normal  limit.  Patient did have pause for 2.90 seconds.  Cardiology was consulted.   ? ?Heart rate has been well controlled, not on AV blocking agents, at this point.  ? ? ? ? ?  ? ?Subjective: patient continue to have anxiety, pain is well controlled with oral analgesics, no nausea or vomiting.  ? ?Physical Exam: ?Vitals:  ? 02/25/22 2200 02/26/22 0600 02/26/22 0954 02/26/22 1546  ?BP: 132/62 136/61 139/61 (!) 160/97  ?Pulse: 68 72  72 (!) 55  ?Resp: '16 17 17 16  '$ ?Temp: 98 ?F (36.7 ?C) 98.2 ?F (36.8 ?C) 98.7 ?F (37.1 ?C)   ?TempSrc:  Oral Oral   ?SpO2: 100% 100% 98% 100%  ?Weight:      ?Height:      ? ?Neurology awake and alert ?ENT wi

## 2022-02-26 NOTE — Progress Notes (Signed)
Pharmacy Antibiotic Note ? ?Jeremiah [name] is a 71 y.o. male admitted on 01/15/2022 with MRSA L shoulder joint infection . The patient's renal function has remained stable since the last vancomycin dose adjustment. Pharmacy has been consulted for vancomycin dosing. ? ?Plan: ?-Continue vancomycin IV 1000 mg Q24H until 4/11 (6 weeks of therapy) ?-Monitor renal function, clinical status, and need for additional vancomycin peaks/troughs  ? ?Height: '5\' 7"'$  (170.2 cm) ?Weight: 87.1 kg (192 lb 0.3 oz) ?IBW/kg (Calculated) : 66.1 kg ? ?Temp (24hrs), Avg:98.2 ?F (36.8 ?C), Min:97.7 ?F (36.5 ?C), Max:98.7 ?F (37.1 ?C) ? ?Recent Labs  ?Lab 02/20/22 ?1610 02/21/22 ?9604 02/24/22 ?0800 02/24/22 ?5409 02/25/22 ?8119  ?CREATININE 0.85 0.82 0.96  --  0.96  ?[place]  --   --   --  17  --   ?  ?Estimated Creatinine Clearance: 76.5 mL/min (by C-G formula based on SCr of 0.96 mg/dL).   ? ?Allergies  ?Allergen Reactions  ? Morphine Hives, Swelling and Rash  ?  Has taken with no reaction since original reaction  ? ? ?Antimicrobials this admission: ?Vancomycin 2/26 >> (4/11) ?Doxycycline PTA 2/14 >> 2/25 ? ?Dose adjustments this admission: ?3/3 VP/VT 26/18, AUC 558 on '750mg'$  q12 ?3/9 VP/VT 31/20, AUC 618 >> change to '1250mg'$  q24 ?3/11 SCr bump >>change to '1000mg'$  q24 ?3/14 SCr downt o 0.85>>back to 1250 mg q24h ?3/17 VP/VT 29/14, cAUC 534 on 1250 mg q24h ?3/27 VT 15 - continued 1250 q24 ?3/31 SCr trending up again, VT up 16 - continue for now based on prior levels but if further SCr increase, will adjust down dose  ?4/6 VT 17 - decrease to '1000mg'$  q 24 hr ? ?Microbiology results: ?2/26 MRSA PCR - positive ?2/27 abscess - MRSA ?2/28 L joint fluid, swab A - negative ?2/28 L joint fluid, swab B - negative ?2/28 L joint fluid, swab C - negative ?  ?Thank you for allowing pharmacy to be a part of this patient?s care. ? ?Shauna Hugh, PharmD, RPh  ?PGY-2 Pharmacy Resident ?02/26/2022 10:49 AM ? ?Please check [email or web address] for unit-specific pharmacy  phone numbers. ? ? ?

## 2022-02-26 NOTE — Assessment & Plan Note (Signed)
Mild reduction in EF. ?Continue with spironolactone.  ?

## 2022-02-26 NOTE — Progress Notes (Signed)
Mobility Specialist Progress Note  ? ? 02/26/22 1214  ?Mobility  ?Activity Ambulated independently in hallway  ?Level of Assistance Modified independent, requires aide device or extra time  ?Assistive Device Other (Comment) ?(IV pole)  ?Distance Ambulated (ft) 570 ft  ?Activity Response Tolerated well  ?$Mobility charge 1 Mobility  ? ?Pt received in chair and agreeable. No complaints on walk. Took x1 seated rest break. Returned to chair with call bell in reach.   ? ?Hildred Alamin ?Mobility Specialist  ?  ?

## 2022-02-27 DIAGNOSIS — T8450XD Infection and inflammatory reaction due to unspecified internal joint prosthesis, subsequent encounter: Secondary | ICD-10-CM | POA: Diagnosis not present

## 2022-02-27 NOTE — Progress Notes (Signed)
?Progress Note ? ? ?Patient: Jeremiah [name]:[id number] DOB: 11/10/1951 DOA: 01/15/2022     43 ?DOS: the patient was seen and examined on 02/27/2022 ?  ?Brief hospital course: ?Patient is a 71 y.o. male with medical history significant of CAD s/p bypass in 1998, Hx of PE and DVT on Eliquis, HTN, alcohol abuse with hx of withdrawal, opioid dependency previous on Suboxone presented to the hospital with left shoulder and neck pain.  Reportedly patient lost his wife last year and moved out to [place] to live with his brother. However did not get along with his sister-in-law and has recently moved back here to [place] l a week prior to presentation and noted to be homeless and living in [place]. Noted to have multiple admissions recently in [place] for alcohol withdrawal.  On 10/2021 he was admitted following a fall and was noted to have a large left-sided chest wall hematoma  had multiple I&D and wound VAC in January due to persistent refractory hematoma, found to have chronically dislocated left shoulder arthroplasty in the setting of likely infection.  On 02/06/2021-CTA showed large burden of thromboembolic disease with central pulmonary artery distribution.  Saw Pulm in office with Dr Janett Billow at [facility]- who noted 'it sounds like his pulmonary embolism was associated with some lower extremity cellulitis which he was hospitalized and minimally mobile. He was treated with Eliquis, now off.  In the ED on this admission patient was hemodynamically stable.  Labs showed some mild anemia of hemoglobin 9.1.  EKG showed atrial flutter.  Troponin was borderline.  Left shoulder x-ray showed anterior dislocation of left reverse total shoulder arthroplasty.  Patient had CTA chest on 01/16/2022 which showed no evidence of pulmonary embolism but pulmonary hypertension and cardiomegaly.  There was note of 4.7 cm thoracic aortic aneurysm.  Radiology recommended dysemia [place] CT or MRI follow-up and referral to  cardiothoracic surgery as outpatient.  Patient was also seen by orthopedics during hospitalization  and the  CT of left shoulder-showed postsurgical changes and dislocation, large periprosthetic fluid collection surrounding the proximal humerus and extending throughout the left axillary region up to 7.8 X4.4X 8.8-sequelae of postoperative seroma, hematoma versus particle disease.  Patient had 2D echocardiogram on 01/16/2022 which showed reduced LV function and cardiology was consulted.  On 01/17/2022 CT-guided biopsy of the left anterior axillary fluid was done with removal of 30 mils of cloudy fluid with.  Patient underwent removal of shoulder implant with excision of sinus tract antibiotic spacer placement and wound VAC placement by Dr. Marlou Sa on 01/18/2022.  On 01/19/2022 infectious disease was consulted who recommended prolonged IV antibiotic course. Patient has homelessness issues which has been a barrier for his disposition as well.  On 01/26/2022, patient was noted to be anemic and received 1 unit of packed RBC.  Otherwise, he has remained stable except for ongoing pain issues. ? ?Assessment and Plan: ?* Prosthetic joint infection ([place]) ?Presented with left shoulder wound.  ?Origin of this wound is a misadventure in [place] in which someone attempted to drain a hematoma. ? ?S/p left shoulder aspiration by IR on 2/27.  Or cultures MRSA ?S/p left shoulder excision of sinus tract, removal of reverse shoulder replacement, antibiotic spacer placement and placement of wound VAC.  Or cultures no growth with no organisms in gram stain ( final ).  ?  ?2. Left sided chest wall hematoma s/p multiple I and Ds ( MRSA) at [place] - stable  ?3. Alcohol abuse ?4. H/o Opioid dependence ?5.  Homelessness ?  ? He has h/o alcohol abuse and  twice seen in the ED in February ( [place]) for head injury and alcohol abuse and ams. It would be unsafe for him to discharge on PO antibiotics at this point given he does not have a  stable housing and this is a Prosthetic joint infection.  ? ?Vancomycin until 4/11, then PO per ID ?Tolerating well antibiotic therapy. ?Continue pain control with oxycodone, gabapentin, ibuprofen  ? 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 ?CT of the left shoulder showed fluid collection and underwent CT-guided biopsy on 01/17/2022 with removal of 30 mil of fluid status post removal of hardware, irrigation/debridement and placement of antibiotic spacer with 6-8 weeks of IV antibiotic.  Orthopedics and infectious disease followed the patient during hospitalization.  ? ?Patient reports joint pain to be well controlled.  ?He is out of bed to chair with good toleration.  ? 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  Continue CHF protocol.  Seen by cardiology during this hospitalization.  Net IO Since Admission: -80,998.93 mL [02/03/22 1326]  On oral lasix daily low dose.  Appears compensated at this time. ? ?Continue with spironolactone.  ?Continue with enalapril for blood pressure control.  ? ?Alcohol abuse ?No withdrawal symptoms at this time..  Continue thiamine, folic acid. ? ?Iron deficiency anemia due to chronic blood loss ?Cell count has been stable with hgb at 8,4 on 03/31  ? ?CAD (coronary artery disease) ?CAD s/p cabg x4 1998 ?Chronic trifascicular block ?Hyperlipidemia ?Continue aspirin and statins.  Hold AV nodal blockers.  ? ?Wrist pain ?Left wrist pain.  Seen by orthopedics.   Uric acid of 4.4.  X-ray of  the left wrist shows severe osteoarthritis.  Orthopedic recommended  3-day course of colchicine which he has completed.    His pain has improved at this time. ? ?Hypokalemia ?Renal function has been stable.  ?Follow up renal function and electrolytes as needed.  ? ?Back pain ?Chronic back pain, spinal stenosis.   Patient does have high tolerance to pain.  On pain medication regimen oxycodone every 4 hourly.  Continue Lidoderm patch, gabapentin 3 times daily and continue tizanidine. ? ?Protein-calorie malnutrition, severe ?Severe protein calorie malnutrition.   ?Present on admission.  Nutrition on board.  Continue supplements.  Encourage oral nutrition. ? ?Thoracic aortic aneurysm (TAA) ([place]) ?CTA incidentally showed a 4.7 cm thoracic aortic aneurysm, will need semiannual CT or MRI imaging/cardiothoracic surgery follow-up as outpatient ? ?Hypotension ?Blood pressure has been stable, continue close monitoring.  ? ? ?B12 deficiency ?Continue vitamin B12 replacement. ? ?History of pulmonary embolism ?Completed treatment with Eliquis.  Not currently on anticoagulation due to risk of falls homelessness, history of hematoma, ongoing alcohol abuse. ? ?Depression ?Continue hydroxyzine, bupropion, buspirone. ?Continue with clonazepam for anxiety bid  ?On quetiapine.  ? ?Atrial flutter ([place]) ?New onset a flutter in the ED ?Asymptomatic bradycardia during the night 2/26: ?TSH was within normal limit.  Patient did have pause for 2.90 seconds.  Cardiology was consulted.   ? ?Heart rate has been well  controlled, not on AV blocking agents, at this point.  ?Continue to encourage mobility.  ? ? ? ? ?  ? ?Subjective: Patient out of bed to chair, no nausea or vomiting, pain is controlled with analgesics.  ? ?Physical Exam: ?Vitals:  ? 02/26/22 2147 02/27/22 0341 02/27/22 0500 02/27/22 0953  ?BP: (!) 98/53 (!) 119/59  (!) 126/54  ?Pulse: 85 71  73  ?Resp: '16 18  18  '$ ?Temp: 98.5 ?F (36.9 ?C) 98.4 ?F (36.9 ?C)  98.2 ?F (36.8 ?C)   ?TempSrc: Oral Oral  Oral  ?SpO2: 97% 97%  100%  ?Weight:   89.5 kg   ?Height:      ? ?Neurology awake and alert ?ENT with no pallor ?Cardiovascular with S1 and S2 present ?Lungs with no wheezing ?Abdomen not diste

## 2022-02-28 ENCOUNTER — Inpatient Hospital Stay (HOSPITAL_COMMUNITY): Payer: Medicare Other

## 2022-02-28 DIAGNOSIS — T8450XD Infection and inflammatory reaction due to unspecified internal joint prosthesis, subsequent encounter: Secondary | ICD-10-CM | POA: Diagnosis not present

## 2022-02-28 DIAGNOSIS — F1010 Alcohol abuse, uncomplicated: Secondary | ICD-10-CM | POA: Diagnosis not present

## 2022-02-28 LAB — CREATININE, SERUM
Creatinine, Ser: 0.97 mg/dL (ref 0.61–1.24)
GFR, Estimated: >60 mL/min (ref >60)

## 2022-02-28 MED ORDER — TIZANIDINE HCL 2 MG PO TABS
2.0000 mg | ORAL_TABLET | Freq: Three times a day (TID) | ORAL | Status: DC
  Administered 2022-02-28 – 2022-03-08 (×25): 2 mg via ORAL
  Filled 2022-02-28 (×26): qty 1

## 2022-02-28 MED ORDER — IOHEXOL 300 MG/ML  SOLN
100.0000 mL | Freq: Once | INTRAMUSCULAR | Status: AC | PRN
  Administered 2022-02-28: 80 mL via INTRAVENOUS

## 2022-02-28 MED ORDER — HYDROXYZINE HCL 25 MG PO TABS
50.0000 mg | ORAL_TABLET | Freq: Three times a day (TID) | ORAL | Status: DC
  Administered 2022-02-28 – 2022-03-08 (×24): 50 mg via ORAL
  Filled 2022-02-28 (×24): qty 2

## 2022-02-28 MED ORDER — ENALAPRIL MALEATE 5 MG PO TABS
5.0000 mg | ORAL_TABLET | Freq: Every day | ORAL | Status: DC
Start: 2022-03-01 — End: 2022-03-06
  Administered 2022-03-01 – 2022-03-05 (×5): 5 mg via ORAL
  Filled 2022-02-28 (×6): qty 1

## 2022-02-28 MED ORDER — HYDROMORPHONE HCL 1 MG/ML IJ SOLN
1.0000 mg | Freq: Once | INTRAMUSCULAR | Status: AC
  Administered 2022-02-28: 1 mg via INTRAVENOUS
  Filled 2022-02-28: qty 1

## 2022-02-28 NOTE — TOC Progression Note (Signed)
Transition of Care (TOC) - Progression Note  ? ? ?Patient Details  ?Name: Jeremiah [name] ?MRN: [id number] ?Date of Birth: August 16, 1951 ? ?Transition of Care (TOC) CM/SW Contact  ?Curlene Labrum, RN ?Phone Number: ?02/28/2022, 2:19 PM ? ?Clinical Narrative:    ?CM called and spoke with the patient's sister-in-law, Dianna and she and her husband have provided two church members to contact to assist the patient after discharge from the hospital.  The patient continues to refuse ALF placement and wants to go to [place].  The patient does not have money, ID or mode of transportation at the time.  The patient is currently homeless but does have available funds in the bank at this time - $5,000.00\.  The patient's brother and sister-in-law are unwilling for him to return to their home for care. ? ?Beaulah Dinning - [phone], [phone]. ? ?I was able to speak with Gillis Ends on the phone and she offered to call the [facility] and speak with one of the ministers to likely offer support to the patient after discharge from the hospital. ? ?CM and MSW with DTP Team will continue to follow the patient for discharge planning. ? ? ?Expected Discharge Plan: [place] ?Barriers to Discharge: Homeless with medical needs ? ?Expected Discharge Plan and Services ?Expected Discharge Plan: [place] ?In-house Referral: Clinical Social Work ?Discharge Planning Services: CM Consult ?  ?Living arrangements for the past 2 months: [place] (Patient was living with his brother/wife in [place] prior to admission to [facility]) ?                ?  ?  ?  ?  ?  ?  ?  ?  ?  ?  ? ? ?Social Determinants of Health (SDOH) Interventions ?  ? ?Readmission Risk Interventions ? ?  02/21/2022  ? 11:53 AM 02/18/2022  ?  1:08 PM  ?Readmission Risk Prevention Plan  ?Transportation Screening Complete Complete  ?Medication Review Press photographer) Complete Complete  ?PCP or Specialist appointment within 3-5 days  of discharge Complete Complete  ?[place] or Home Care Consult Complete Complete  ?SW Recovery Care/Counseling Consult Complete Complete  ?Palliative Care Screening Not Applicable Not Applicable  ?[place] Not Applicable   ? ? ?

## 2022-02-28 NOTE — Progress Notes (Signed)
Mobility Specialist Progress Note: ? ? 02/28/22 1033  ?Mobility  ?Activity Ambulated with assistance in hallway  ?Level of Assistance Independent after set-up  ?Assistive Device None  ?LUE Weight Bearing NWB  ?Distance Ambulated (ft) 570 ft  ?Activity Response Tolerated well  ?$Mobility charge 1 Mobility  ? ?Pt received in chair willing to participate in mobility. Complaints of shoulder pain and feeling nauseous. Left in chair with call bell in reach and all needs met.  ? ?Michaela Shankel ?Mobility Specialist ?Primary Phone [phone] ? ?

## 2022-02-28 NOTE — Progress Notes (Signed)
?Progress Note ?  ?  ?Patient: Jeremiah KEMPPAINEN YSA:[id number] DOB: 1951-08-28 DOA: 01/15/2022     33 ?DOS: the patient was seen and examined on 02/17/2022 ?  ?  ?  ?  ?Brief hospital course: ?See previous extensive summary by Dr. Louanne Belton on 02/02/22 ?  ?In short: 71 y.o. M with CAD s/p CABG 98, PE on Eliquis, HTN, chronic pain and opiate dependence previously on Suboxone who presented with shoulder pain.  ?  ?Had been living in [place] for several years, alcohol dependent, at some point had a fall and hematoma of the left chest, that ultimately must have gotten infected because he got a debridement and wound vac.  In the context of that treatment was found to have a chronically dislocated shoulder, he had a prosthetic shoulder replacement in [place].   ?  ?In that context, he moved back to , and unfortunately, literally got off the bus with severe pain in his shoulder, came straight here and was found to have a MRSA infection that had entered the prosthetic joint. ?  ?Hospital stay complicated by homelessness as well as patient's lack of motivation regarding contacting his banks in regards to obtaining findings related to his so security retirement check which is enough money to allow him to stay in a nice short stay hotel.  He also does not have a driver's license for which she does not accept accountability for and blames the [employer] for removing his driving privileges. ?  ?  ?  ?  ?Assessment and Plan: ?*Prosthetic shoulder infection ?- Continue vancomycin until 4/11, then PO per ID ?-Dr. Marlou Sa documented on the afternoon of 4/6 that patient will need a CT scan in the near future to let the team know whether it is feasible to perform a reimplantation procedure of another prostatic joint.  And only if infection can be ruled out.   ?-I contacted ID who stated no additional antibiotics needed after the 6 weeks of IV antibiotics are completed on 4/11. ?-Orthopedic team plan to  obtain CT of left shoulder with thin cuts once antibiotics completed to determine if patient appropriate for additional surgical procedures.  Patient finishes antibiotics on 4/11 and for now if no additional surgical procedures are planned he will discharge on 4/12.  I took the liberty of ordering a CT of the left shoulder with and without contrast on 4/10 ?He was complaining of inability to use both shoulders stating he had previously been diagnosed with rotator cuff injury to right shoulder by his pain doctor.  Of note patient was able to use both arms to push up to get self out of chair. ?We will also obtain plain x-ray of right shoulder ?  ?Chronic combined systolic CHF (congestive heart failure) ([place]) ?Moderate to severe aortic regurgitation on prior echo now mild ?Aortic root 4.27 cm ?Euvolemic, without symptoms ?EF on echocardiogram this admission 35 to 40% ?No beta-blocker secondary to chronic trifascicular block ?Low-dose Lasix discontinued in favor of spironolactone since this is a potassium sparing medication and patient has been having difficulty swallowing potassium tab ?Continue spironolactone ?Tolerating low-dose in the labral and plan is to increase this dose with first dose to be given 4/11 ?  ?Chronic alcoholism/severe anxiety ?Although patient does demonstrate mild short-term memory loss based on cognitive evaluation from [facility] symptoms are mild to severe enough to prohibit independent functioning in the community ?Further information obtained from patient's brother in [place] by case manager.  Patient exhibited  same symptoms down there but memory loss and inability to function appeared to be selective.  Patient had no trouble navigating outside of the home and obtaining alcohol, spending his Social Security check irresponsibly and utilizing methamphetamine. ?Patient continues to complain of pain but then states pain would be improved if his anxiety was improved so we will continue current doses  of BuSpar 15 mg 3 times daily as well as Wellbutrin XR 300 mg every morning.  We will also increase his Vistaril to 50 mg scheduled 3 times daily ?In regards to pain management continue short acting Oxy IR 10 mg every 6 hours.  We will not increase this pain medication.  Continue Zanaflex 2 mg every 8 hours. ?Avoid benzodiazepines ?  ?Iron deficiency anemia due to chronic blood loss ?Iron indices low.  Hgb stable. Given IV iron. ?- Start oral iron ?  ?  ?CAD (coronary artery disease) ?CAD s/p cabg x4 1998 ?Chronic trifascicular block ?Hyperlipidemia ?Avoid AV nodal blockade ?Continue aspirin and Crestor ?  ?Chronic back pain, spinal stenosis with ? opiate dependence and history of substance use disorder ?According to information obtained directly from the patient he states he was not in a pain clinic but apparently was obtaining Suboxone previously from PCP although documentation seems to reflect patient did go to a Suboxone clinic before this was weaned and discontinued ?Postoperatively patient was on a high dose of Oxy IR i.e. 10 mg 6 times a day to equal 60 mg ?Dr. Loleta Books had a lengthy discussion with him and plan was to transition to Suboxone which patient is currently tolerating well ?Once surgery decision made by the orthopedic team and definite discharge date in place we will contact Suboxone clinic and let them know the patient needs to follow-up.  They will contact patient and give him a number so he can call and arrange for an intake exam. ?Continue tizanidine, gabapentin, Wellbutrin ?Patient may require short-term use of oxy IR in the immediate postoperative timeframe but will once again be transitioned back to Suboxone.  May need to consult pharmacist for assistance ? ?Homelessness ?Patient apparently came up here by bus without a plan.  During this transition he lost his wallet including his driver's license ?Currently patient is not taking accountability for contacting the [place] department  or his bank regarding having access to his retirement check.  He also remains focused on his lack of ability to drive ?  ? ?  ? ?  ?Subjective:  ? ?  ?Physical Exam: ?      ?Vitals:  ?  02/16/22 0504 02/16/22 1944 02/17/22 0448 02/17/22 0600  ?BP: 114/64 (!) 105/44 (!) 107/56    ?Pulse: 73 81 81    ?Resp:   18 18    ?Temp: 99.6 ?F (37.6 ?C) 98.4 ?F (36.9 ?C) 99.2 ?F (37.3 ?C)    ?TempSrc: Oral Oral Oral    ?SpO2: 98% 97% 98%    ?Weight:       82 kg  ?Height:          ?  ?  ?Constitutional: NAD, calm, comfortable ?Respiratory: clear to auscultation bilaterally, no wheezing, no crackles. Normal respiratory effort.  Room air ?Cardiovascular: Regular rate and rhythm, no murmurs / rubs / gallops.  Slightly worse in LLE edema which is chronic and pitting.  Progressive and somewhat new RLE edema pitting 2-3+.  Normotensive ?Abdomen: no tenderness, no masses palpated. No hepatosplenomegaly. Bowel sounds positive.  LBM 4/3 ?Neurologic: CN 2-12 grossly intact. Sensation  intact, DTR normal. Strength 5/5 x all 4 extremities.  Ambulates independently ?Psychiatric: Alert and oriented x 3.  Very mild short-term memory deficits ?  ?  ?Data Reviewed: ?Prior notes reviewed, nursing notes reviewed, vital signs reviewed. ?No new labs ?  ?Family Communication:  ?  ?Disposition: ?Status is: Inpatient ?Remains inpatient appropriate because:  ?Patient requires prolonged course of IV antibiotics, however he currently has no home, and has no identification or access to his money to procure housing.  In the setting of alcohol abuse and having been seen twice in the emergency room in [place] within the month prior to admission for alcohol abuse, altered mental status, and head injury it would be unsafe to discharge him on oral antibiotics at this time and potentially limb threatening in the context of a prosthetic joint infection ?  ?  ?Planned Discharge Destination:  ?Current unsafe discharge plan.  Patient currently homeless and requires  IV antibiotics through 4/11.  It is anticipated that once antibiotics are completed he will require surgery to remove antibiotic spacer before he has medically stable to discharge. ?  ?  ?Medically

## 2022-03-01 DIAGNOSIS — T8450XD Infection and inflammatory reaction due to unspecified internal joint prosthesis, subsequent encounter: Secondary | ICD-10-CM | POA: Diagnosis not present

## 2022-03-01 DIAGNOSIS — Z86711 Personal history of pulmonary embolism: Secondary | ICD-10-CM | POA: Diagnosis not present

## 2022-03-01 DIAGNOSIS — F1010 Alcohol abuse, uncomplicated: Secondary | ICD-10-CM | POA: Diagnosis not present

## 2022-03-01 LAB — CBC WITH DIFFERENTIAL/PLATELET
Abs Immature Granulocytes: 0.02 10*3/uL (ref 0.00–0.07)
Basophils Absolute: 0.1 10*3/uL (ref 0.0–0.1)
Basophils Relative: 1 %
Eosinophils Absolute: 0.3 10*3/uL (ref 0.0–0.5)
Eosinophils Relative: 5 %
HCT: 33.4 % — ABNORMAL LOW (ref 39.0–52.0)
Hemoglobin: 9.9 g/dL — ABNORMAL LOW (ref 13.0–17.0)
Immature Granulocytes: 0 %
Lymphocytes Relative: 16 %
Lymphs Abs: 0.8 10*3/uL (ref 0.7–4.0)
MCH: 25.9 pg — ABNORMAL LOW (ref 26.0–34.0)
MCHC: 29.6 g/dL — ABNORMAL LOW (ref 30.0–36.0)
MCV: 87.4 fL (ref 80.0–100.0)
Monocytes Absolute: 0.3 10*3/uL (ref 0.1–1.0)
Monocytes Relative: 6 %
Neutro Abs: 3.9 10*3/uL (ref 1.7–7.7)
Neutrophils Relative %: 72 %
Platelets: 188 10*3/uL (ref 150–400)
RBC: 3.82 MIL/uL — ABNORMAL LOW (ref 4.22–5.81)
RDW: 19.8 % — ABNORMAL HIGH (ref 11.5–15.5)
WBC: 5.4 10*3/uL (ref 4.0–10.5)
nRBC: 0 % (ref 0.0–0.2)

## 2022-03-01 LAB — QUANTIFERON-TB GOLD PLUS (RQFGPL)
QuantiFERON Mitogen Value: >10 IU/mL
QuantiFERON Nil Value: 0.02 IU/mL
QuantiFERON TB1 Ag Value: 0.02 IU/mL
QuantiFERON TB2 Ag Value: 0.02 IU/mL

## 2022-03-01 LAB — SEDIMENTATION RATE: Sed Rate: 14 mm/hr (ref 0–16)

## 2022-03-01 LAB — QUANTIFERON-TB GOLD PLUS: QuantiFERON-TB Gold Plus: NEGATIVE

## 2022-03-01 LAB — C-REACTIVE PROTEIN: CRP: 0.6 mg/dL (ref <1.0)

## 2022-03-01 NOTE — Progress Notes (Addendum)
CT scan reviewed. ?Does not look like the glenoid is reconstructable ?Large fluid collection in the joint.  Plan to aspirate the joint in 2 weeks after he has been off antibiotics. ?Okay to stay in sling until that time ?Anticipate removal of antibiotic spacer and likely no further intervention based on parameters at this time. ?Plan to get CBC DIF sed rate C-reactive protein to gauge his recovery from the infection ?

## 2022-03-01 NOTE — Progress Notes (Addendum)
?Progress Note ?  ?  ?Patient: Jeremiah [name] JKD:[id number] DOB: 1951-10-12 DOA: 01/15/2022     33 ?DOS: the patient was seen and examined on 02/17/2022 ?  ?  ?  ?  ?Brief hospital course: ?See previous extensive summary by Dr. Louanne Belton on 02/02/22 ?  ?In short: 71 y.o. M with CAD s/p CABG 98, PE on Eliquis, HTN, chronic pain and opiate dependence previously on Suboxone who presented with shoulder pain.  ?  ?Had been living in [place] for several years, alcohol dependent, at some point had a fall and hematoma of the left chest, that ultimately must have gotten infected because he got a debridement and wound vac.  In the context of that treatment was found to have a chronically dislocated shoulder, he had a prosthetic shoulder replacement in [place].   ?  ?In that context, he moved back to [place], and unfortunately, literally got off the bus with severe pain in his shoulder, came straight here and was found to have a MRSA infection that had entered the prosthetic joint. ?  ?Hospital stay complicated by homelessness as well as patient's lack of motivation regarding contacting his banks in regards to obtaining findings related to his so security retirement check which is enough money to allow him to stay in a nice short stay hotel.  He also does not have a driver's license for which she does not accept accountability for and blames the [employer] for removing his driving privileges. ?  ?  ?  ?  ?Assessment and Plan: ?*Prosthetic shoulder infection ?- Continue vancomycin until 4/11, then PO per ID ?-Dr. Marlou Sa documented on the afternoon of 4/6 that patient will need a CT scan in the near future to let the team know whether it is feasible to perform a reimplantation procedure of another prostatic joint.  And only if infection can be ruled out.   ?-I contacted ID who stated no additional antibiotics needed after the 6 weeks of IV antibiotics are completed on 4/11. ?-Per Dr. Marlou Sa this is  his plan: ?CT scan reviewed. ?Does not look like the glenoid is unreconstructable ?Large fluid collection in the joint.  Plan to aspirate the joint in 2 weeks after he has been off antibiotics. ?Okay to stay in sling until that time ?Anticipate removal of antibiotic spacer and likely no further intervention based on parameters at this time. ?Plan to get CBC DIF sed rate C-reactive protein to gauge his recovery from the infection ?-The patient also reported that he has a known "rotator cuff" injury on his right shoulder and that this has been worked up by his pain doctor in the past and surgery was recommended.  Plain x-ray of right shoulder obtained on 4/10 and this revealed no acute fracture or dislocation.  Progression of severe glenohumeral degenerative changes and severe chronic rotator cuff tendinopathy. ?  ?Chronic combined systolic CHF (congestive heart failure) ([place]) ?Moderate to severe aortic regurgitation on prior echo now mild ?Aortic root 4.27 cm ?Euvolemic, without symptoms ?EF on echocardiogram this admission 35 to 40% ?No beta-blocker secondary to chronic trifascicular block ?Low-dose Lasix discontinued in favor of spironolactone since this is a potassium sparing medication and patient has been having difficulty swallowing potassium tab ?Continue spironolactone and Vasotec ?  ?Chronic alcoholism/severe anxiety ?Although patient does demonstrate mild short-term memory loss based on cognitive evaluation from [facility] symptoms are mild to severe enough to prohibit independent functioning in the community ?Further information obtained from patient's brother  in [place] by case manager.  Patient exhibited same symptoms down there but memory loss and inability to function appeared to be selective.  Patient had no trouble navigating outside of the home and obtaining alcohol, spending his Social Security check irresponsibly and utilizing methamphetamine. ?Continue BuSpar, Wellbutrin XR, Vistaril, Oxy IR  scheduled every 6 hours and Zanaflex every 8 hours ? ?Iron deficiency anemia due to chronic blood loss ?Iron indices low.  Hgb stable. Given IV iron. ?- Start oral iron ?  ?  ?CAD (coronary artery disease) ?CAD s/p cabg x4 1998 ?Chronic trifascicular block ?Hyperlipidemia ?Avoid AV nodal blockade ?Continue aspirin and Crestor ?  ?Chronic back pain, spinal stenosis with ? opiate dependence and history of substance use disorder ?According to information obtained directly from the patient he states he was not in a pain clinic but apparently was obtaining Suboxone previously from PCP although documentation seems to reflect patient did go to a Suboxone clinic before this was weaned and discontinued ?Postoperatively patient was on a high dose of Oxy IR i.e. 10 mg 6 times a day to equal 60 mg ?Dr. Loleta Books had a lengthy discussion with him and plan was to transition to Suboxone which patient is currently tolerating well ?Once surgery decision made by the orthopedic team and definite discharge date in place we will contact Suboxone clinic and let them know the patient needs to follow-up.  They will contact patient and give him a number so he can call and arrange for an intake exam. ?See above regarding pain and depression/anxiety regimen ? ?Homelessness ?Patient apparently came up here by bus without a plan.  During this transition he lost his wallet including his driver's license ?Currently patient is not taking accountability for contacting the [place] department or his bank regarding having access to his retirement check.  He also remains focused on his lack of ability to drive ?  ? ?  ? ?  ?Subjective:  ?Laying in bed.  Discussed that unless the orthopedic surgeon had plans to proceed with surgical intervention this admission that he would be discharged tomorrow.  Once again began shaking his head when informed that we have no other discharge options for him and unfortunately he would need to discharge to the  homeless shelter.  When this plan was mentioned he states "well I will go to a nursing home if I need to".  Reminded patient that to do this that he would need to give up his entire check and previously he is stated that he would not do this.  He states he is willing to do this if a place is available.  When I mentioned that the good thing about this is his mind he now will be at a facility and potentially with him managing it he could have access to it after discharge without having to go to [facility] he stated his usual reply of "this is too much I just cannot handle this or figure this out" Case management made aware of above conversation and plans to discuss with patient as well as previous facilities that have potential bed offers. ?  ?Physical Exam: ?      ?Vitals:  ?  02/16/22 0504 02/16/22 1944 02/17/22 0448 02/17/22 0600  ?BP: 114/64 (!) 105/44 (!) 107/56    ?Pulse: 73 81 81    ?Resp:   18 18    ?Temp: 99.6 ?F (37.6 ?C) 98.4 ?F (36.9 ?C) 99.2 ?F (37.3 ?C)    ?TempSrc: Oral Oral  Oral    ?SpO2: 98% 97% 98%    ?Weight:       82 kg  ?Height:          ?  ?  ?Constitutional: NAD, calm, comfortable ?Respiratory: clear to auscultation bilaterally, no wheezing, no crackles. Normal respiratory effort.  Room air ?Cardiovascular: Regular rate and rhythm, no murmurs / rubs / gallops.  Slightly worse in LLE edema which is chronic and pitting.  Chronic LLE pitting edema.  RLE edema much more improved. ?Abdomen: no tenderness, no masses palpated. No hepatosplenomegaly. Bowel sounds positive.  LBM 4/10 ?Neurologic: CN 2-12 grossly intact. Sensation intact, DTR normal. Strength 5/5 x all 4 extremities.  Ambulates independently ?Psychiatric: Alert and oriented x 3.  Very mild short-term memory deficits ?  ?  ?Data Reviewed: ?Prior notes reviewed, nursing notes reviewed, vital signs reviewed. ?No new labs ?  ?Family Communication:  ?  ?Disposition: ?Status is: Inpatient ?Remains inpatient appropriate because:  ?He will  complete long-term IV antibiotics today on 4/11 and current plan is to discharge homeless given no other options.  At this time he is willing to discharge to an SNF or ALF if a bed becomes available.  CM made awar

## 2022-03-01 NOTE — TOC Progression Note (Addendum)
Transition of Care (TOC) - Progression Note  ? ? ?Patient Details  ?Name: Jeremiah [name] ?MRN: [id number] ?Date of Birth: 22-Jun-1951 ? ?Transition of Care (TOC) CM/SW Contact  ?Curlene Labrum, RN ?Phone Number: ?03/01/2022, 10:08 AM ? ?Clinical Narrative:    ?CM spoke with the patient at the bedside to discuss patient's need to discharge to an [facility] for care.  The patient is agreeable to discharge to the facility once he is able to discuss costs again with [facility].  I spoke with Grier Rocher, manager of [facility] and she plans to speak with the patient at the bedside today to discuss admission to the facility.  [name], NP is aware of plans to discharge to the ALF at a pending discharge date determined by the facility. ? ?03/02/2022 1400 - CM spoke with the patient's pastor from [facility] [place] [phone] and he plans to visit with the patient on 03/02/2022 for spiritual support.  I explained to the minister about the patient's inability to obtain funds from his bank due to lack of sufficient ID and transportation.  CM with speak with pastor at the bedside to request assistance from the church if they are willing.  The patient has birth certificate and copy of expire license at this time.  ? ?1500 - CM met with Donny Pique, [facility] at the bedside and she has offered an available bed at the facility once the patient is able to obtain funds from his bank account.  The patient was reluctant at first due to cost of stay at the facility - $3700.00 per month for 6 months contract.  The patient was agreeable and may speak with the pastor on 03/02/2022 to request assistance in transportation to the bank. ? ?I called and spoke with the patient's sister-in-law on the phone and she plans to send clothing and shoes through the mail to the patient.  The patient expressed concerns over not having available shoes for  ambulation. ? ?CM and MSW with DTP Team will continue to follow the patient for discharge planning needs. ? ? ?Expected Discharge Plan: [place] ?Barriers to Discharge: Homeless with medical needs ? ?Expected Discharge Plan and Services ?Expected Discharge Plan: [place] ?In-house Referral: Clinical Social Work ?Discharge Planning Services: CM Consult ?  ?Living arrangements for the past 2 months: [place] (Patient was living with his brother/wife in [place] prior to admission to [facility]) ?                ?  ?  ?  ?  ?  ?  ?  ?  ?  ?  ? ? ?Social Determinants of Health (SDOH) Interventions ?  ? ?Readmission Risk Interventions ? ?  02/21/2022  ? 11:53 AM 02/18/2022  ?  1:08 PM  ?Readmission Risk Prevention Plan  ?Transportation Screening Complete Complete  ?Medication Review Press photographer) Complete Complete  ?PCP or Specialist appointment within 3-5 days of discharge Complete Complete  ?[place] or Home Care Consult Complete Complete  ?SW Recovery Care/Counseling Consult Complete Complete  ?Palliative Care Screening Not Applicable Not Applicable  ?[place] Not Applicable   ? ? ?

## 2022-03-01 NOTE — Progress Notes (Signed)
Mobility Specialist Progress Note: ? ? 03/01/22 1054  ?Mobility  ?Activity Ambulated with assistance in hallway  ?Level of Assistance Independent after set-up  ?Assistive Device None  ?LUE Weight Bearing NWB  ?Distance Ambulated (ft) 570 ft  ?Activity Response Tolerated well  ?$Mobility charge 1 Mobility  ? ?Pt received in bed willing to participate in mobility. Complaints of L shoulder pain. Left in chair with call bell in reach and all needs met.  ? ?Deran Barro ?Mobility Specialist ?Primary Phone [phone] ? ?

## 2022-03-01 NOTE — Progress Notes (Incomplete Revision)
CT scan reviewed. ?Does not look like the glenoid is reconstructable ?Large fluid collection in the joint.  Plan to aspirate the joint in 2 weeks after he has been off antibiotics. ?Okay to stay in sling until that time ?Anticipate removal of antibiotic spacer and likely no further intervention based on parameters at this time. ?Plan to get CBC DIF sed rate C-reactive protein to gauge his recovery from the infection ?

## 2022-03-01 NOTE — Progress Notes (Signed)
Nutrition Follow-up ? ?DOCUMENTATION CODES:  ?Severe malnutrition in context of social or environmental circumstances ? ?INTERVENTION:  ?Continue current diet as ordered, encourage PO intake ?Ensure Max po 2x/d, each supplement provides 150 kcal and 30 grams of protein.  ?MVI with minerals BID ?500 mg Calcium carbonate TID ? ?NUTRITION DIAGNOSIS:  ?Severe Malnutrition related to social / environmental circumstances (etoh and drug use, hx of gastric bypass) as evidenced by severe fat depletion, severe muscle depletion. ?- remains applicable ? ?GOAL:  ?Patient will meet greater than or equal to 90% of their needs ?- nutrition supplements in place ? ?MONITOR:  ?PO intake, Labs, Supplement acceptance, Weight trends ? ?REASON FOR ASSESSMENT:  ?Consult ?Assessment of nutrition requirement/status, Poor PO, Wound healing ? ?ASSESSMENT:  ?71 y.o. male with history of CAD s/p CABGx4, HTN, HLD, CHF (EF 35-40%), GERD, gout, alcohol abuse, opioid dependency, hx of gastric bypass surgery, and CKD2, presented to ED with worsening left shoulder pain and left-sided neck pain from an injury which occurred ~a year ago. Noted pt is currently homeless and has been for about 6 months. ? ?2/28 - Op, left RSA implant removal with I&D of shoulder joint, placement of antibiotic spacer and wound vac. Hemovac drain placed ? ?Pt continues to have stable intake of meals and no weight loss this admission. Noted that today is last day of antibiotic treatment, pt to be discharged to homeless shelter tomorrow unless SNF placement is obtained. CM working on plan.  ? ?Noted that pt's snacks from yesterday still in fridge as are his AM snacks. Will discontinue from kitchen as pt is not receiving and also not asking for them, this has been discussed with pt multiple times. Pt frequently reporting he does not want to gain weight, despite being told he needs nutrition for recovery.  ? ?Will continue vitamins and ensure as ordered ? ?Average Meal  Intake: ?2/26-3/1: 91% intake x 7 recorded meal ?3/2-3/6: 69% intake x 5 recorded meals ?3/7-3/13: 100% intake x 10 recorded meals ?3/14-3/21: 60% intake x 10 recorded meals ?3/22-3/28: 63% intake x 2 recorded meals ?3/29-4/4: 89% intake x 9 recorded meals ?4/5-4/11: 92% intake x 6 recorded meals ? ?Nutritionally Relevant Medications: ?Scheduled Meds: ? calcium carbonate  1 tablet Oral TID  ? cholecalciferol  1,000 Units Oral Daily  ? docusate sodium  100 mg Oral BID  ? ferrous sulfate  325 mg Oral QODAY  ? multivitamin with minerals  1 tablet Oral BID  ? pantoprazole  40 mg Oral BID  ? Ensure Max Protein  11 oz Oral BID  ? rosuvastatin  40 mg Oral Daily  ? spironolactone  25 mg Oral Daily  ? vitamin B-12  1,000 mcg Oral Daily  ? ?PRN Meds: phenol, metoCLOPramide ? ?Labs Reviewed ? ?Vitamin Labs ?Vitamin A 25.7 (normal, 3/2) ?Vitamin B1 108.8 (normal, 3/2) ?Vitamin B6 2.0 (low, 3/2) ?Vitamin B12 176 (low, 2/26)  ?Vitamin C .6 (normal, 3/2) ?Vitamin D 13.65 (low, 3/2) ?Vitamin E 9.0 (alpha), 1.1 (gamma) (normal, 3/2) ?Vitamin K .46 (normal, 3/2) ?Folate 20.8 (normal, 2/26) ?Copper 97 (normal 3/2) ?Zinc 45 (normal, 3/2) ?Iron 24 (low, 2/26) ? ?NUTRITION - FOCUSED PHYSICAL EXAM: ?Flowsheet Row Most Recent Value  ?Orbital Region Severe depletion  ?Upper Arm Region Severe depletion  ?Thoracic and Lumbar Region Severe depletion  ?Buccal Region Severe depletion  ?Temple Region Severe depletion  ?Clavicle Bone Region Mild depletion  ?Clavicle and Acromion Bone Region Mild depletion  ?Scapular Bone Region Mild depletion  ?Dorsal Hand Severe  depletion  ?Patellar Region Moderate depletion  ?Anterior Thigh Region Moderate depletion  ?Posterior Calf Region Severe depletion  ?Edema (RD Assessment) Mild  ?Hair Reviewed  ?Eyes Reviewed  ?Mouth Reviewed  ?Skin Reviewed  [scattered brusing]  ?Nails Reviewed  ? ?Diet Order:   ?Diet Order   ? ?       ?  Diet regular Room service appropriate? Yes; Fluid consistency: Thin  Diet effective  now       ?  ? ?  ?  ? ?  ? ? ?EDUCATION NEEDS:  ?Education needs have been addressed ? ?Skin:  Skin Assessment:  (open wound on L fifth toe) ?Skin Integrity Issues:: Other (Comment), Incisions ?Incisions: left shoulder ?Other: abrasions to the head, legs, and shoulders; ecchymosis to the bilateral arms and legs ? ?Last BM:  4/10 ? ?Height:  ?Ht Readings from Last 1 Encounters:  ?01/18/22 '5\' 7"'$  (1.702 m)  ? ? ?Weight:  ?Wt Readings from Last 1 Encounters:  ?02/28/22 89.2 kg  ? ? ?Ideal Body Weight:  67.3 kg ? ?BMI:  Body mass index is 30.8 kg/m?. ? ?Estimated Nutritional Needs:  ?Kcal:  2200-2500 kcal/d ?Protein:  120-135g/d ?Fluid:  2.3-2.5 L/d ? ? ?Jeremiah [name], RD, LDN ?Clinical Dietitian ?RD pager # available in [place]  ?After hours/weekend pager # available in [place] ?

## 2022-03-02 ENCOUNTER — Other Ambulatory Visit (HOSPITAL_COMMUNITY): Payer: Self-pay

## 2022-03-02 DIAGNOSIS — T8450XD Infection and inflammatory reaction due to unspecified internal joint prosthesis, subsequent encounter: Secondary | ICD-10-CM | POA: Diagnosis not present

## 2022-03-02 DIAGNOSIS — G894 Chronic pain syndrome: Secondary | ICD-10-CM | POA: Diagnosis not present

## 2022-03-02 DIAGNOSIS — F1010 Alcohol abuse, uncomplicated: Secondary | ICD-10-CM | POA: Diagnosis not present

## 2022-03-02 LAB — CBC WITH DIFFERENTIAL/PLATELET
Abs Immature Granulocytes: 0.02 10*3/uL (ref 0.00–0.07)
Basophils Absolute: 0.1 10*3/uL (ref 0.0–0.1)
Basophils Relative: 1 %
Eosinophils Absolute: 0.5 10*3/uL (ref 0.0–0.5)
Eosinophils Relative: 9 %
HCT: 30.9 % — ABNORMAL LOW (ref 39.0–52.0)
Hemoglobin: 9.4 g/dL — ABNORMAL LOW (ref 13.0–17.0)
Immature Granulocytes: 0 %
Lymphocytes Relative: 20 %
Lymphs Abs: 1 10*3/uL (ref 0.7–4.0)
MCH: 26.2 pg (ref 26.0–34.0)
MCHC: 30.4 g/dL (ref 30.0–36.0)
MCV: 86.1 fL (ref 80.0–100.0)
Monocytes Absolute: 0.4 10*3/uL (ref 0.1–1.0)
Monocytes Relative: 8 %
Neutro Abs: 3.3 10*3/uL (ref 1.7–7.7)
Neutrophils Relative %: 62 %
Platelets: 173 10*3/uL (ref 150–400)
RBC: 3.59 MIL/uL — ABNORMAL LOW (ref 4.22–5.81)
RDW: 19.8 % — ABNORMAL HIGH (ref 11.5–15.5)
WBC: 5.2 10*3/uL (ref 4.0–10.5)
nRBC: 0 % (ref 0.0–0.2)

## 2022-03-02 LAB — BASIC METABOLIC PANEL
Anion gap: 6 (ref 5–15)
BUN: 27 mg/dL — ABNORMAL HIGH (ref 8–23)
CO2: 26 mmol/L (ref 22–32)
Calcium: 8.7 mg/dL — ABNORMAL LOW (ref 8.9–10.3)
Chloride: 107 mmol/L (ref 98–111)
Creatinine, Ser: 0.87 mg/dL (ref 0.61–1.24)
GFR, Estimated: >60 mL/min (ref >60)
Glucose, Bld: 90 mg/dL (ref 70–99)
Potassium: 4.3 mmol/L (ref 3.5–5.1)
Sodium: 139 mmol/L (ref 135–145)

## 2022-03-02 MED ORDER — FERROUS SULFATE 325 (65 FE) MG PO TABS: 325.0000 mg | ORAL_TABLET | ORAL | 3 refills | Status: AC

## 2022-03-02 MED ORDER — ENSURE MAX PROTEIN PO LIQD: 11.0000 [oz_av] | Freq: Two times a day (BID) | ORAL | 3 refills | Status: DC

## 2022-03-02 MED ORDER — CYANOCOBALAMIN 1000 MCG PO TABS: 1000.0000 ug | ORAL_TABLET | Freq: Every day | ORAL | 3 refills | Status: DC

## 2022-03-02 MED ORDER — ENALAPRIL MALEATE 5 MG PO TABS: 5.0000 mg | ORAL_TABLET | Freq: Every day | ORAL | 3 refills | Status: DC

## 2022-03-02 MED ORDER — SPIRONOLACTONE 25 MG PO TABS: 25.0000 mg | ORAL_TABLET | Freq: Every day | ORAL | 3 refills | Status: DC

## 2022-03-02 MED ORDER — GABAPENTIN 300 MG PO CAPS: 300.0000 mg | ORAL_CAPSULE | Freq: Three times a day (TID) | ORAL | 3 refills | Status: AC

## 2022-03-02 MED ORDER — VITAMIN D3 25 MCG PO TABS: 1000.0000 [IU] | ORAL_TABLET | Freq: Every day | ORAL | 3 refills | Status: AC

## 2022-03-02 MED ORDER — MELATONIN 5 MG PO TABS
5.0000 mg | ORAL_TABLET | Freq: Every day | ORAL | 3 refills | Status: DC
  Filled 2022-03-02: qty 30, 30d supply, fill #0

## 2022-03-02 MED ORDER — HYDROXYZINE HCL 10 MG PO TABS: 10.0000 mg | ORAL_TABLET | ORAL | 0 refills | Status: DC | PRN

## 2022-03-02 MED ORDER — PANTOPRAZOLE SODIUM 40 MG PO TBEC: 40.0000 mg | DELAYED_RELEASE_TABLET | Freq: Two times a day (BID) | ORAL | 3 refills | Status: AC

## 2022-03-02 MED ORDER — ACETAMINOPHEN 325 MG PO TABS: 650.0000 mg | ORAL_TABLET | Freq: Four times a day (QID) | ORAL | 3 refills | Status: AC | PRN

## 2022-03-02 MED ORDER — QUETIAPINE FUMARATE 50 MG PO TABS
50.0000 mg | ORAL_TABLET | Freq: Every day | ORAL | 3 refills | Status: DC
  Filled 2022-03-02: qty 30, 30d supply, fill #0

## 2022-03-02 MED ORDER — LIDOCAINE 5 % EX PTCH
1.0000 | MEDICATED_PATCH | CUTANEOUS | 3 refills | Status: DC
Start: 2022-03-03 — End: 2022-03-25

## 2022-03-02 MED ORDER — HYDROXYZINE HCL 50 MG PO TABS: 50.0000 mg | ORAL_TABLET | Freq: Three times a day (TID) | ORAL | 3 refills | Status: DC

## 2022-03-02 MED ORDER — BUSPIRONE HCL 15 MG PO TABS: 15.0000 mg | ORAL_TABLET | Freq: Three times a day (TID) | ORAL | 3 refills | Status: AC

## 2022-03-02 MED ORDER — BUPROPION HCL ER (XL) 300 MG PO TB24: 300.0000 mg | ORAL_TABLET | Freq: Every morning | ORAL | 3 refills | Status: AC

## 2022-03-02 MED ORDER — MORPHINE SULFATE ER 15 MG PO TBCR: 15.0000 mg | EXTENDED_RELEASE_TABLET | Freq: Two times a day (BID) | ORAL | 0 refills | Status: DC

## 2022-03-02 MED ORDER — MORPHINE SULFATE ER 15 MG PO TBCR
15.0000 mg | EXTENDED_RELEASE_TABLET | Freq: Two times a day (BID) | ORAL | Status: DC
  Administered 2022-03-02 – 2022-03-08 (×13): 15 mg via ORAL
  Filled 2022-03-02 (×13): qty 1

## 2022-03-02 MED ORDER — ADULT MULTIVITAMIN W/MINERALS CH: 1.0000 | ORAL_TABLET | Freq: Two times a day (BID) | ORAL | 3 refills | Status: AC

## 2022-03-02 MED ORDER — ALLOPURINOL 100 MG PO TABS: 100.0000 mg | ORAL_TABLET | Freq: Every day | ORAL | 3 refills | Status: AC

## 2022-03-02 MED ORDER — TIZANIDINE HCL 2 MG PO TABS
2.0000 mg | ORAL_TABLET | Freq: Three times a day (TID) | ORAL | 3 refills | Status: DC
  Filled 2022-03-02: qty 90, 30d supply, fill #0

## 2022-03-02 MED ORDER — DOCUSATE SODIUM 100 MG PO CAPS
100.0000 mg | ORAL_CAPSULE | Freq: Two times a day (BID) | ORAL | 3 refills | Status: AC
Start: 2022-03-02 — End: ?

## 2022-03-02 MED ORDER — ROSUVASTATIN CALCIUM 40 MG PO TABS: 40.0000 mg | ORAL_TABLET | Freq: Every day | ORAL | 3 refills | Status: AC

## 2022-03-02 NOTE — Progress Notes (Signed)
Occupational Therapy Treatment ?Patient Details ?Name: Jeremiah [name] ?MRN: [id number] ?DOB: 10/27/1951 ?Today's Date: 03/02/2022 ? ? ?History of present illness Pt is a 71 y/o M presenting to ED on 2/25 wtih L shoulder wound with pain radiating to chest. Found to have L shoulder anteromedial dislocation after fall 3 months ago. S/p removal of reverse shoulder implant with antibiotic spacer on 2/28. PMH includes CAD s/p bypass, hx of PE and DVT on eliquis, HTN and alcohol abuse. ?  ?OT comments ? Pt reminded he is not to use or weight bear on L shoulder and that he has an antibiotic spacer, no joint. Reinforced compensatory strategies for ADLs and issued and instructed in use of long handled bath sponge for back and to reach under R arm. Pt with decreased memory, unable to recall 3 words within 10 minutes with distractions. Pt inconsistently aware of memory deficits. Educated pt to write down concerns, questions and information given by medical staff for him to refer to compensate for memory loss. Pt seems to be at peace with going to ALF, he states he may just continue living there on a long term basis.   ? ?Recommendations for follow up therapy are one component of a multi-disciplinary discharge planning process, led by the attending physician.  Recommendations may be updated based on patient status, additional functional criteria and insurance authorization. ?   ?Follow Up Recommendations ? No OT follow up  ?  ?Assistance Recommended at Discharge Set up Supervision/Assistance  ?Patient can return home with the following ? Assistance with cooking/housework;Direct supervision/assist for financial management;Assist for transportation;A little help with bathing/dressing/bathroom;Direct supervision/assist for medications management ?  ?Equipment Recommendations ? None recommended by OT  ?  ?Recommendations for Other Services   ? ?  ?Precautions / Restrictions Precautions ?Precautions: Shoulder ?Shoulder Interventions:  Shoulder sling/immobilizer;At all times ?Required Braces or Orthoses: Sling ?Restrictions ?Weight Bearing Restrictions: Yes ?LUE Weight Bearing: Non weight bearing ?Other Position/Activity Restrictions: sling on at all times except for dressing/exercise, AROM of hand/wrist/elbow ok, no shoulder AROM/PROM per MD  ? ? ?  ? ?Mobility Bed Mobility ?  ?  ?  ?  ?  ?  ?  ?General bed mobility comments: pt in chair on arrival ?  ? ?Transfers ?Overall transfer level: Independent ?Equipment used: None ?  ?  ?  ?  ?  ?  ?  ?  ?  ?  ?Balance   ?  ?Sitting balance-Leahy Scale: Normal ?  ?  ?  ?Standing balance-Leahy Scale: Good ?Standing balance comment: able to reach to floor to pick up trash and recover without LOB ?  ?  ?  ?  ?  ?  ?  ?  ?  ?  ?  ?   ? ?ADL either performed or assessed with clinical judgement  ? ?ADL Overall ADL's : Needs assistance/impaired ?  ?  ?  ?  ?Upper Body Bathing: Modified independent;Sitting ?Upper Body Bathing Details (indicate cue type and reason): issued long handled bath sponge for back and getting under R arm ?Lower Body Bathing: Set up;Sitting/lateral leans;Sit to/from stand ?  ?Upper Body Dressing : Set up;Sitting ?Upper Body Dressing Details (indicate cue type and reason): pt dressing L UE first without cues ?Lower Body Dressing: Set up;Sitting/lateral leans ?Lower Body Dressing Details (indicate cue type and reason): donned loose socks ?  ?  ?  ?  ?  ?  ?Functional mobility during ADLs: Independent ?  ?  ? ?Extremity/Trunk Assessment Upper  Extremity Assessment ?Upper Extremity Assessment: LUE deficits/detail ?LUE Deficits / Details: performed AROM L elbow to hand x 10 ?  ?  ?  ?  ?  ? ?Vision   ?  ?  ?Perception   ?  ?Praxis   ?  ? ?Cognition Arousal/Alertness: Awake/alert ?Behavior During Therapy: [facility] for tasks assessed/performed ?Overall Cognitive Status: Impaired/Different from baseline ?Area of Impairment: Memory, Problem solving ?  ?  ?  ?  ?  ?  ?  ?  ?  ?  ?Memory: Decreased  short-term memory ?  ?  ?  ?Problem Solving: Requires verbal cues, Difficulty sequencing ?General Comments: pt recalling he has an antibiotic spacer in his shoulder, no joint, did not recall Dr Randel Pigg visit yesterday ?  ?  ?   ?Exercises   ? ?  ?Shoulder Instructions   ? ? ?  ?General Comments    ? ? ?Pertinent Vitals/ Pain       Pain Assessment ?Pain Assessment: No/denies pain ? ?Home Living   ?  ?  ?  ?  ?  ?  ?  ?  ?  ?  ?  ?  ?  ?  ?  ?  ?  ?  ? ?  ?Prior Functioning/Environment    ?  ?  ?  ?   ? ?Frequency ? Min 1X/week  ? ? ? ? ?  ?Progress Toward Goals ? ?OT Goals(current goals can now be found in the care plan section) ? Progress towards OT goals: Progressing toward goals ? ?Acute Rehab OT Goals ?OT Goal Formulation: With patient ?Time For Goal Achievement: 03/10/22 ?Potential to Achieve Goals: Good  ?Plan Discharge plan needs to be updated   ? ?Co-evaluation ? ? ?   ?  ?  ?  ?  ? ?  ?AM-PAC OT "6 Clicks" Daily Activity     ?Outcome Measure ? ? Help from another person eating meals?: None ?Help from another person taking care of personal grooming?: None ?Help from another person toileting, which includes using toliet, bedpan, or urinal?: None ?Help from another person bathing (including washing, rinsing, drying)?: None ?Help from another person to put on and taking off regular upper body clothing?: None ?Help from another person to put on and taking off regular lower body clothing?: None ?6 Click Score: 24 ? ?  ?End of Session Equipment Utilized During Treatment: Other (comment) (L UE sling) ? ?OT Visit Diagnosis: Other symptoms and signs involving cognitive function ?  ?Activity Tolerance Patient tolerated treatment well ?  ?Patient Left in chair;with call bell/phone within reach ?  ?Nurse Communication   ?  ? ?   ? ?Time: 3007-6226 ?OT Time Calculation (min): 33 min ? ?Charges: OT General Charges ?$OT Visit: 1 Visit ?OT Treatments ?$Self Care/Home Management : 23-37 mins ? ?Nestor Lewandowsky, OTR/L ?Acute  Rehabilitation Services ?Pager: [phone] ?Office: [phone]  ? ?Malka So ?03/02/2022, 2:53 PM ?

## 2022-03-02 NOTE — Progress Notes (Signed)
?Progress Note ?  ?  ?Patient: Jeremiah [name] SUP:[id number] DOB: 07-12-1951 DOA: 01/15/2022     33 ?DOS: the patient was seen and examined on 02/17/2022 ?  ?  ?  ?  ?Brief hospital course: ?See previous extensive summary by Dr. Louanne Belton on 02/02/22 ?  ?In short: 71 y.o. M with CAD s/p CABG 98, PE on Eliquis, HTN, chronic pain and opiate dependence previously on Suboxone who presented with shoulder pain.  ?  ?Had been living in [place] for several years, alcohol dependent, at some point had a fall and hematoma of the left chest, that ultimately must have gotten infected because he got a debridement and wound vac.  In the context of that treatment was found to have a chronically dislocated shoulder, he had a prosthetic shoulder replacement in [place].   ?  ?In that context, he moved back to [place], and unfortunately, literally got off the bus with severe pain in his shoulder, came straight here and was found to have a MRSA infection that had entered the prosthetic joint. ?  ?Hospital stay complicated by homelessness as well as patient's lack of motivation regarding contacting his banks in regards to obtaining findings related to his so security retirement check which is enough money to allow him to stay in a nice short stay hotel.  He also does not have a driver's license for which she does not accept accountability for and blames the [employer] for removing his driving privileges. ? ?After further discussion with CM, director for potentially excepting ALF, as well as pastor and other family he has agreed that the best plan is to be discharged to an ALF.  An offer has been made and CM has assisted patient with accessing his bank accounts and his debit card will be mailed directly to the assisted living facility.  Final discharge date at discretion of ALF pending their final approval ?  ?  ?  ?  ?Assessment and Plan: ?*Prosthetic shoulder infection ?- Continue vancomycin until 4/11,  then PO per ID ?-Dr. Marlou Sa documented on the afternoon of 4/6 that patient will need a CT scan in the near future to let the team know whether it is feasible to perform a reimplantation procedure of another prostatic joint.  And only if infection can be ruled out.   ?-I contacted ID who stated no additional antibiotics needed after the 6 weeks of IV antibiotics are completed on 4/11. ?-Per Dr. Marlou Sa this is his plan: ?CT scan reviewed. ?Does not look like the glenoid is unreconstructable ?Large fluid collection in the joint.  Plan to aspirate the joint in 2 weeks after he has been off antibiotics. ?Okay to stay in sling until that time ?Anticipate removal of antibiotic spacer and likely no further intervention based on parameters at this time. ?Plan to get CBC DIF sed rate C-reactive protein to gauge his recovery from the infection ? ?  ?Chronic combined systolic CHF (congestive heart failure) ([place]) ?Moderate to severe aortic regurgitation on prior echo now mild ?Aortic root 4.27 cm ?Euvolemic, without symptoms ?EF on echocardiogram this admission 35 to 40% ?No beta-blocker secondary to chronic trifascicular block ?Low-dose Lasix discontinued in favor of spironolactone since this is a potassium sparing medication and patient has been having difficulty swallowing potassium tab ?Continue spironolactone and Vasotec ?  ?Chronic alcoholism/severe anxiety ?Although patient does demonstrate mild short-term memory loss based on cognitive evaluation from SLP symptoms are mild to severe enough to prohibit independent  functioning in the community ?Further information obtained from patient's brother in [place] by case manager.  Patient exhibited same symptoms down there but memory loss and inability to function appeared to be selective although cognitive testing here did reveal mild cognitive deficit.  Patient had no trouble navigating outside of the home and obtaining alcohol, spending his Social Security check irresponsibly  and utilizing methamphetamine. ?Continue BuSpar, Wellbutrin XR, Vistaril, and Zanaflex every 8 hours ?Notes he will discharge to an ALF plan is to change scheduled Oxy IR to MS Contin 15 mg every 12 hours and follow response ? ?Iron deficiency anemia due to chronic blood loss ?Iron indices low.  Hgb stable.  Was given IV iron. ?Continue oral iron ?  ?  ?CAD (coronary artery disease) ?CAD s/p cabg x4 1998 ?Chronic trifascicular block ?Hyperlipidemia ?Avoid AV nodal blockade ?Continue aspirin and Crestor ?  ?Chronic back pain, spinal stenosis with ? opiate dependence and history of substance use disorder ?According to information obtained directly from the patient he states he was not in a pain clinic but apparently was obtaining Suboxone previously from PCP although documentation seems to reflect patient did go to a Suboxone clinic before this was weaned and discontinued ?Postoperatively patient was on a high dose of Oxy IR i.e. 10 mg 6 times a day to equal 60 mg ?Dr. Loleta Books had a lengthy discussion with him and plan was to transition to Suboxone which patient is currently tolerating well ?Once surgery decision made by the orthopedic team and definite discharge date in place we will contact Suboxone clinic and let them know the patient needs to follow-up.  They will contact patient and give him a number so he can call and arrange for an intake exam. ?See above regarding pain and depression/anxiety regimen ? ?Homelessness ?After lengthy discussion with appropriate parties (see HPI as well as CM note for 4/12) plan is to discharge to ALF who has tentatively offered a bed pending final confirmation of financials-of note bank will be sending his debit card to the ALF so patient will be able to pay for services rendered. ?  ? ?  ? ?  ?Subjective:  ?Patient awake.  Primarily reporting of inadequate pain control.  Remains agreeable to discharge to ALF noting no other option except being discharged as homeless. ?  ?Physical  Exam: ?      ?Vitals:  ?  02/16/22 0504 02/16/22 1944 02/17/22 0448 02/17/22 0600  ?BP: 114/64 (!) 105/44 (!) 107/56    ?Pulse: 73 81 81    ?Resp:   18 18    ?Temp: 99.6 ?F (37.6 ?C) 98.4 ?F (36.9 ?C) 99.2 ?F (37.3 ?C)    ?TempSrc: Oral Oral Oral    ?SpO2: 98% 97% 98%    ?Weight:       82 kg  ?Height:          ?  ?  ?Constitutional: NAD, calm, comfortable ?Respiratory: clear to auscultation bilaterally, no wheezing, no crackles. Normal respiratory effort.  Room air ?Cardiovascular: Regular rate, S1-S2 without S4 gallop, Chronic LLE pitting edema.  RLE edema much more improved. ?Abdomen: no tenderness, no masses palpated. No hepatosplenomegaly. Bowel sounds positive.  LBM 4/10 ?Neurologic: CN 2-12 grossly intact. Sensation intact, DTR normal. Strength 5/5 x all 4 extremities.  Ambulates independently ?Psychiatric: Alert and oriented x 3.  Very mild short-term memory deficits ?  ?  ?Data Reviewed: ?Prior notes reviewed, nursing notes reviewed, vital signs reviewed. ?No new labs ?  ?Family Communication:  ?  ?  Disposition: ?Status is: Inpatient ?Completed long-term IV antibiotics 4/11.  Once ALF can confirm financials plan is to discharge ?  ?  ?Planned Discharge Destination:  ?Current unsafe discharge plan.  Patient currently homeless and requires IV antibiotics through 4/11.  It is anticipated that once antibiotics are completed he will require surgery to remove antibiotic spacer before he has medically stable to discharge. ?  ?  ?Medically stable ?No still needs to finish antibiotic ?  ?Inpatient ?For inpatient is because patient needs to continue IV antibiotics and currently is homeless therefore not eligible for home antibiotics ?  ?COVID vaccination status:  ?Moderna: 01/04/2020, 02/01/2020 and 09/22/2020 ?  ?Consultants: ?Cardiology ?Infectious disease ?Orthopedics ?Procedures: ?Echocardiogram ?Removal of reverse shoulder replacement, extensive debridement and placement of antibiotic spacer secondary to infective  left prosthetic reverse shoulder replacement.  Wound VAC also placed ?Antibiotics: ?Vancomycin 2/25 >> ?  ?Author: ?[name], NP ?02/17/2022 8:25 AM ?

## 2022-03-02 NOTE — TOC Progression Note (Signed)
Transition of Care (TOC) - Progression Note  ? ? ?Patient Details  ?Name: Jeremiah [name] ?MRN: [id number] ?Date of Birth: 1951-08-11 ? ?Transition of Care (TOC) CM/SW Contact  ?Curlene Labrum, RN ?Phone Number: ?03/02/2022, 1:31 PM ? ?Clinical Narrative:    ?CM met with the patient at the bedside to assist the patient in making phone calls to obtain copy of bank card and continue discussion regarding placement at accepting ALF facility.  I assisted the patient to call the [employer] [phone] Roney Jaffe, bank contact)  to give permission to send copy of his debit card to [facility], [address], [place], [place] [address], that patient has agreed admission offer.  The patient needed access to pay for his admission to the facility on Tuesday - 03/08/2022 at total of agreed amount of 3700.00 per month for next 6 months and amount of continued stay will be discussed with the patient at a later date per [facility].  The patient is agreeable to admission to the facility. ? ?The patient's sister-in-law, Kunaal Walkins and brother Izell Volant called and spoke with the patient over the phone to update.  They are sending patient's clothing and shoes to the hospital this week in preparation for the patient's discharge to the facility.  The patient states that his elderly uncle, came to visit him last night and gave him a cell phone charger for his phone along with cash. ? ?Loleta Chance, pastor [phone] at [facility] called and states that he visited at the bedside with the patient to offer spiritual support to the patient.  Maurie Boettcher states that he spoke with the patient and he states that the patient looks forward to discharging to the facility for care - Theodoro Kos is willing to provide transportation to the facility the day he discharges to the ALF by car. ? ?CM called and spoke with Donny Pique, owner of [facility] and he has offered an available bed to the  patient on Tuesday, 03/08/2022.  Patient's signed prescriptions were emailed to [facility] at [email or web address]. ?The ALF sends patient's Rx through [facility].  Lissa Merlin, NP plans to have 7 days of prescriptions sent with the patient to the facility through [place]. ? ?Donny Pique, [facility] plans to meet with the patient tomorrow at 2 pm to sign consent forms at the bedside for the admission to the facility. ? ?Contacts for CM support: ? ?[facility] [phone]; [email or web address] ?Loleta Chance - pastor at [employer] in [place] [phone] ?Diane Rollene Rotunda / Daviyon Widmayer - brother - [phone], [phone] ? ?CM and MSW with DTP Team will continue to follow the patient for discharge planning to [facility] on Tuesday, 03/08/2022. ? ? ? ? ?Expected Discharge Plan: Assisted Living ?Barriers to Discharge: Homeless with medical needs ? ?Expected Discharge Plan and Services ?Expected Discharge Plan: Assisted Living ?In-house Referral: Clinical Social Work ?Discharge Planning Services: CM Consult ?  ?Living arrangements for the past 2 months: [facility] (Patient was living with his brother/wife in [place] prior to admission to [facility]) ?                ?  ?  ?  ?  ?  ?  ?  ?  ?  ?  ? ? ?Social Determinants of Health (SDOH) Interventions ?  ? ?Readmission Risk Interventions ? ?  03/02/2022  ?  1:30 PM 02/21/2022  ? 11:53 AM  02/18/2022  ?  1:08 PM  ?Readmission Risk Prevention Plan  ?Transportation Screening Complete Complete Complete  ?Medication Review Press photographer) Complete Complete Complete  ?PCP or Specialist appointment within 3-5 days of discharge Complete Complete Complete  ?[place] or Home Care Consult Complete Complete Complete  ?SW Recovery Care/Counseling Consult Complete Complete Complete  ?Palliative Care Screening  Not Applicable Not Applicable  ?[place] Not Applicable Not Applicable   ? ? ?

## 2022-03-02 NOTE — Progress Notes (Signed)
Mobility Specialist Progress Note: ? ? 03/02/22 1101  ?Mobility  ?Activity Ambulated with assistance in hallway  ?Level of Assistance Independent after set-up  ?Assistive Device None  ?LUE Weight Bearing NWB  ?Distance Ambulated (ft) 570 ft  ?Activity Response Tolerated well  ?$Mobility charge 1 Mobility  ? ?Pt received in chair willing to participate in mobility. Complaints of L shoulder pain. Left in chair with call bell in reach and all needs met.  ? ?Lenise Jr ?Mobility Specialist ?Primary Phone [phone] ? ?

## 2022-03-03 ENCOUNTER — Other Ambulatory Visit (HOSPITAL_COMMUNITY): Payer: Self-pay

## 2022-03-03 DIAGNOSIS — G894 Chronic pain syndrome: Secondary | ICD-10-CM | POA: Diagnosis not present

## 2022-03-03 DIAGNOSIS — F1010 Alcohol abuse, uncomplicated: Secondary | ICD-10-CM | POA: Diagnosis not present

## 2022-03-03 DIAGNOSIS — T8450XD Infection and inflammatory reaction due to unspecified internal joint prosthesis, subsequent encounter: Secondary | ICD-10-CM | POA: Diagnosis not present

## 2022-03-03 NOTE — Progress Notes (Signed)
?Progress Note ?  ?  ?Patient: Jeremiah [name] JAS:[id number] DOB: Aug 07, 1951 DOA: 01/15/2022     33 ?DOS: the patient was seen and examined on 02/17/2022 ?  ?  ?  ?  ?Brief hospital course: ?See previous extensive summary by Dr. Louanne Belton on 02/02/22 ?  ?In short: 71 y.o. M with CAD s/p CABG 98, PE on Eliquis, HTN, chronic pain and opiate dependence previously on Suboxone who presented with shoulder pain.  ?  ?Had been living in [place] for several years, alcohol dependent, at some point had a fall and hematoma of the left chest, that ultimately must have gotten infected because he got a debridement and wound vac.  In the context of that treatment was found to have a chronically dislocated shoulder, he had a prosthetic shoulder replacement in [place].   ?  ?In that context, he moved back to [place], and unfortunately, literally got off the bus with severe pain in his shoulder, came straight here and was found to have a MRSA infection that had entered the prosthetic joint. ?  ?Hospital stay complicated by homelessness as well as patient's lack of motivation regarding contacting his banks in regards to obtaining findings related to his so security retirement check which is enough money to allow him to stay in a nice short stay hotel.  He also does not have a driver's license for which she does not accept accountability for and blames the [employer] for removing his driving privileges. ? ?After further discussion with CM, director for potentially excepting ALF, as well as pastor and other family he has agreed that the best plan is to be discharged to an ALF.  An offer has been made and CM has assisted patient with accessing his bank accounts and his debit card will be mailed directly to the assisted living facility.  Final discharge date at discretion of ALF pending their final approval ?  ?  ?  ?  ?Assessment and Plan: ?*Prosthetic shoulder infection ?- Continue vancomycin until 4/11,  then PO per ID ?-Dr. Marlou Sa documented on the afternoon of 4/6 that patient will need a CT scan in the near future to let the team know whether it is feasible to perform a reimplantation procedure of another prostatic joint.  And only if infection can be ruled out.   ?-I contacted ID who stated no additional antibiotics needed after the 6 weeks of IV antibiotics are completed on 4/11. ?-Per Dr. Marlou Sa this is his plan: ?CT scan reviewed. ?Does not look like the glenoid is unreconstructable ?Large fluid collection in the joint.  Plan to aspirate the joint in 2 weeks after he has been off antibiotics. ?Okay to stay in sling until that time ?Anticipate removal of antibiotic spacer and likely no further intervention based on parameters at this time. ?Plan to get CBC DIF sed rate C-reactive protein to gauge his recovery from the infection ? ?  ?Chronic combined systolic CHF (congestive heart failure) ([place]) ?Moderate to severe aortic regurgitation on prior echo now mild ?Aortic root 4.27 cm ?Euvolemic, without symptoms ?EF on echocardiogram this admission 35 to 40% ?No beta-blocker secondary to chronic trifascicular block ?Low-dose Lasix discontinued in favor of spironolactone since this is a potassium sparing medication and patient has been having difficulty swallowing potassium tab ?Continue spironolactone and Vasotec ?  ?Chronic alcoholism/severe anxiety ?Although patient does demonstrate mild short-term memory loss based on cognitive evaluation from SLP symptoms are mild to severe enough to prohibit independent  functioning in the community ?Further information obtained from patient's brother in [place] by case manager.  Patient exhibited same symptoms down there but memory loss and inability to function appeared to be selective although cognitive testing here did reveal mild cognitive deficit.  Patient had no trouble navigating outside of the home and obtaining alcohol, spending his Social Security check irresponsibly  and utilizing methamphetamine. ?Continue BuSpar, Wellbutrin XR, Vistaril, and Zanaflex every 8 hours ?Notes he will discharge to an ALF plan is to change scheduled Oxy IR to MS Contin 15 mg every 12 hours and follow response ? ?Iron deficiency anemia due to chronic blood loss ?Iron indices low.  Hgb stable.  Was given IV iron. ?Continue oral iron ?  ?  ?CAD (coronary artery disease) ?CAD s/p cabg x4 1998 ?Chronic trifascicular block ?Hyperlipidemia ?Avoid AV nodal blockade ?Continue aspirin and Crestor ?  ?Chronic back pain, spinal stenosis with ? opiate dependence and history of substance use disorder ?According to information obtained directly from the patient he states he was not in a pain clinic but apparently was obtaining Suboxone previously from PCP although documentation seems to reflect patient did go to a Suboxone clinic before this was weaned and discontinued ?Postoperatively patient was on a high dose of Oxy IR i.e. 10 mg 6 times a day to equal 60 mg ?On 4/12 patient was reporting inadequate pain control on Oxy IR 10 mg every 6 hours.  Decision made to change patient to MS Contin 15 mg every 12 hours since he will be discharging to an ALF ?See above regarding pain and depression/anxiety regimen ? ?Homelessness ?After lengthy discussion with appropriate parties (see HPI as well as CM note for 4/12) plan is to discharge to ALF who has tentatively offered a bed pending final confirmation of financials-of note bank will be sending his debit card to the ALF so patient will be able to pay for services rendered. ?  ? ?  ? ?  ?Subjective:  ?Patient initially agitated and somewhat confrontational.  When myself and CM entered room patient was throwing away his clothing when asked why he stated that "they have urine on him and I cannot get them clean they also have that infection on it and I do not want to catch it again".  Explained that at this point no concerns about catching infection and the close can be  washed.  He treats her clothing from the wastebasket.  He then had multiple questions for centimeters and as she attempted to answer them he stated "I know that, I am 71 years old and I am an adult".  Eventually after much discussion and redirection patient finally calmed down as we attempted to address his questions and concerns.  Basically he was angry because a family member told him that he had dementia. ?  ?Physical Exam: ?      ?Vitals:  ?  02/16/22 0504 02/16/22 1944 02/17/22 0448 02/17/22 0600  ?BP: 114/64 (!) 105/44 (!) 107/56    ?Pulse: 73 81 81    ?Resp:   18 18    ?Temp: 99.6 ?F (37.6 ?C) 98.4 ?F (36.9 ?C) 99.2 ?F (37.3 ?C)    ?TempSrc: Oral Oral Oral    ?SpO2: 98% 97% 98%    ?Weight:       82 kg  ?Height:          ?  ?  ?Constitutional: NAD, initially agitated but eventually redirected and became more appropriate in conversation and manner comfortable ?Respiratory:  clear to auscultation bilaterally, no wheezing, no crackles. Normal respiratory effort.  Room air ?Cardiovascular: Regular rate, S1-S2 without S4 gallop, Chronic LLE pitting edema.  RLE edema much more improved. ?Abdomen: no tenderness, no masses palpated. No hepatosplenomegaly. Bowel sounds positive.  LBM 4/10 ?Neurologic: CN 2-12 grossly intact. Sensation intact, DTR normal. Strength 5/5 x all 4 extremities.  Ambulates independently ?Psychiatric: Alert and oriented x 3.  Very mild short-term memory deficits ?  ?  ?Data Reviewed: ?Prior notes reviewed, nursing notes reviewed, vital signs reviewed. ?No new labs ?  ?Family Communication:  ?  ?Disposition: ?Status is: Inpatient ?Completed long-term IV antibiotics 4/11.  Awaiting ALF to confirm financial plan with patient with tentative plan to discharge on 4/18 ?  ?  ?Planned Discharge Destination:  ?ALF ?  ?  ?Medically stable ?No still needs to finish antibiotic ?  ?Inpatient ?For inpatient is because patient needs to continue IV antibiotics and currently is homeless therefore not eligible  for home antibiotics ?  ?COVID vaccination status:  ?Moderna: 01/04/2020, 02/01/2020 and 09/22/2020 ?  ?Consultants: ?Cardiology ?Infectious disease ?Orthopedics ?Procedures: ?Echocardiogram ?Removal of reverse

## 2022-03-03 NOTE — Progress Notes (Signed)
Mobility Specialist Progress Note: ? ? 03/03/22 1011  ?Mobility  ?Activity Ambulated with assistance in hallway  ?Level of Assistance Independent  ?Assistive Device None  ?LUE Weight Bearing NWB  ?Distance Ambulated (ft) 570 ft  ?Activity Response Tolerated well  ?$Mobility charge 1 Mobility  ? ?Pt received in bed willing to participate in mobility. Complaints of shoulder pain. Left in chair with call bell in reach and all needs met.  ? ?Jeremiah [name] ?Mobility Specialist ?Primary Phone [phone] ? ?

## 2022-03-03 NOTE — TOC Progression Note (Addendum)
Transition of Care (TOC) - Progression Note  ? ? ?Patient Details  ?Name: Jeremiah [name] ?MRN: [id number] ?Date of Birth: [birth year] ? ?Transition of Care (TOC) CM/SW Contact  ?Curlene Labrum, RN ?Phone Number: ?03/03/2022, 12:33 PM ? ?Clinical Narrative:    ?CM met with the patient this morning to continue discussion for patient to be discharged to [facility] [phone]) on Tuesday, 03/08/2022.  The patient is aware that the owner of the facility, [facility], plans to meet with the patient this afternoon at 1400 to have him sign a 6 month contract for admission and will renegotiate the price for bed after six months with the patient since the patient states he is concerned about the cost.  The patient is agreeable to discharge to the facility and MSW will coordinate transfer to the facility - likely through the [facility], Loleta Chance - [phone] - to transport the patient by car.   ? ?[facility] will provide 7 days of medications and [facility] received copies of prescriptions via fax on 03/02/2022.  Original prescriptions are located in the chart and will be sent with the patient when he is discharged to the facility. ? ?CM and MSW with DTP Team will continue to follow the patient for discharge to [facility] private pay on Tuesday, 03/08/2022 by car. ? ? ?Expected Discharge Plan: Assisted Living ?Barriers to Discharge: Homeless with medical needs ? ?Expected Discharge Plan and Services ?Expected Discharge Plan: Assisted Living ?In-house Referral: Clinical Social Work ?Discharge Planning Services: CM Consult ?  ?Living arrangements for the past 2 months: [place] (Patient was living with his brother/wife in [place] prior to admission to [facility]) ?                ?  ?  ?  ?  ?  ?  ?  ?  ?  ?  ? ? ?Social Determinants of Health (SDOH) Interventions ?  ? ?Readmission Risk Interventions ? ?  03/02/2022  ?  1:30 PM 02/21/2022  ? 11:53 AM 02/18/2022  ?  1:08 PM   ?Readmission Risk Prevention Plan  ?Transportation Screening Complete Complete Complete  ?Medication Review Press photographer) Complete Complete Complete  ?PCP or Specialist appointment within 3-5 days of discharge Complete Complete Complete  ?[place] or Home Care Consult Complete Complete Complete  ?SW Recovery Care/Counseling Consult Complete Complete Complete  ?Palliative Care Screening  Not Applicable Not Applicable  ?[place] Not Applicable Not Applicable   ? ? ?

## 2022-03-04 DIAGNOSIS — T8450XD Infection and inflammatory reaction due to unspecified internal joint prosthesis, subsequent encounter: Secondary | ICD-10-CM | POA: Diagnosis not present

## 2022-03-04 DIAGNOSIS — G894 Chronic pain syndrome: Secondary | ICD-10-CM | POA: Diagnosis not present

## 2022-03-04 NOTE — Plan of Care (Signed)
?  Problem: Activity: ?Goal: Capacity to carry out activities will improve ?Outcome: Progressing ?  ?Problem: Clinical Measurements: ?Goal: Ability to maintain clinical measurements within normal limits will improve ?Outcome: Progressing ?  ?Problem: Clinical Measurements: ?Goal: Will remain free from infection ?Outcome: Progressing ?  ?

## 2022-03-04 NOTE — Progress Notes (Signed)
Mobility Specialist Progress Note: ? ? 03/04/22 1126  ?Mobility  ?Activity Ambulated with assistance in hallway  ?Level of Assistance Independent  ?Assistive Device None  ?LUE Weight Bearing NWB  ?Distance Ambulated (ft) 570 ft  ?Activity Response Tolerated well  ?$Mobility charge 1 Mobility  ? ?Pt received in bed willing to participate in mobility. No complaints of pain. Left in bed with call bell in reach and all needs met.  ? ?Arlee Bossard ?Mobility Specialist ?Primary Phone [phone] ? ?

## 2022-03-04 NOTE — Progress Notes (Signed)
?Progress Note ?  ?  ?Patient: Jeremiah [name] IZT:[id number] DOB: [birth year] DOA: 01/15/2022     33 ?DOS: the patient was seen and examined on 02/17/2022 ?  ?  ?  ?  ?Brief hospital course: ?See previous extensive summary by Dr. Louanne Belton on 02/02/22 ?  ?In short: 71 y.o. M with CAD s/p CABG 98, PE on Eliquis, HTN, chronic pain and opiate dependence previously on Suboxone who presented with shoulder pain.  ?  ?Had been living in [place] for several years, alcohol dependent, at some point had a fall and hematoma of the left chest, that ultimately must have gotten infected because he got a debridement and wound vac.  In the context of that treatment was found to have a chronically dislocated shoulder, he had a prosthetic shoulder replacement in [place].   ?  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 ?  ?Hospital stay complicated by homelessness as well as patient's lack of motivation regarding contacting his banks in regards to obtaining findings related to his so security retirement check which is enough money to allow him to stay in a nice short stay hotel.  He also does not have a driver's license for which she does not accept accountability for and blames the [employer] for removing his driving privileges. ? ?After further discussion with CM, director for potentially excepting ALF, as well as pastor and other family he has agreed that the best plan is to be discharged to an ALF.  An offer has been made and CM has assisted patient with accessing his bank accounts and his debit card will be mailed directly to the assisted living facility.  Final discharge date at discretion of ALF pending their final approval ?  ?  ?  ?  ?Assessment and Plan: ?*Prosthetic shoulder infection ?-I contacted ID who stated no  additional antibiotics needed after the 6 weeks of IV antibiotics are completed on 4/11. ?-Per Dr. Marlou Sa this is his plan: ?CT scan reviewed. ?Does not look like the glenoid is unreconstructable ?Large fluid collection in the joint.  Plan to aspirate the joint in 2 weeks after he has been off antibiotics. ?Okay to stay in sling until that time ?Anticipate removal of antibiotic spacer and likely no further intervention based on parameters at this time. ?Plan to get CBC DIF sed rate C-reactive protein to gauge his recovery from the infection ? ?  ?Chronic combined systolic CHF (congestive heart failure) ([place]) ?Moderate to severe aortic regurgitation on prior echo now mild ?Aortic root 4.27 cm ?Euvolemic, without symptoms ?EF on echocardiogram this admission 35 to 40% ?No beta-blocker secondary to chronic trifascicular block ?Low-dose Lasix due to difficulty in swallowing potassium tablet ?Continue spironolactone and Vasotec ?  ?Chronic alcoholism/severe anxiety ?Although patient does demonstrate mild short-term memory loss based on cognitive evaluation from SLP symptoms are mild to severe enough to prohibit independent functioning in the community ?Further information obtained from patient's brother in [place] by case manager.  Patient exhibited same symptoms down there but memory loss and inability to function appeared to be selective although cognitive testing here did reveal mild cognitive deficit.  Patient had no trouble navigating outside of the home and obtaining alcohol, spending his Social Security check irresponsibly and utilizing methamphetamine. ?Continue BuSpar, Wellbutrin XR, Vistaril, and Zanaflex  every 8 hours ? ? ?Iron deficiency anemia due to chronic blood loss ?Iron indices low.  Hgb stable.  Was given IV iron. ?Continue oral iron ?  ?  ?CAD (coronary artery disease) ?CAD s/p cabg x4 1998 ?Chronic trifascicular block ?Hyperlipidemia ?Avoid AV nodal blockade ?Continue aspirin and Crestor ?   ?Chronic back pain, spinal stenosis with ? opiate dependence and history of substance use disorder ?Postoperatively patient was on a high dose of Oxy IR i.e. 10 mg 6 times a day to equal 60 mg ?Continue MS Contin 15 mg every 12 hours since will be discharging to ALF.  In addition continue Neurontin and Zanaflex ? ?Homelessness ?After lengthy discussion with appropriate parties (see HPI as well as CM note for 4/12) plan is to discharge to ALF who has tentatively offered a bed pending final confirmation of financials-of note bank will be sending his debit card to the ALF so patient will be able to pay for services rendered. ?  ? ?  ? ?  ?Subjective:  ?In bed.  Again having some mildly paranoid thoughts regarding payment issues with ALF but eventually was redirected ?  ?Physical Exam: ?      ?Vitals:  ?  02/16/22 0504 02/16/22 1944 02/17/22 0448 02/17/22 0600  ?BP: 114/64 (!) 105/44 (!) 107/56    ?Pulse: 73 81 81    ?Resp:   18 18    ?Temp: 99.6 ?F (37.6 ?C) 98.4 ?F (36.9 ?C) 99.2 ?F (37.3 ?C)    ?TempSrc: Oral Oral Oral    ?SpO2: 98% 97% 98%    ?Weight:       82 kg  ?Height:          ?  ?  ?Constitutional: NAD, initially agitated but eventually redirected and became more appropriate in conversation and manner comfortable ?Respiratory: clear to auscultation bilaterally, no wheezing, no crackles. Normal respiratory effort.  Room air ?Cardiovascular: Regular rate, S1-S2 without S4 gallop, Chronic LLE pitting edema.  RLE edema much more improved. ?Abdomen: no tenderness, no masses palpated. No hepatosplenomegaly. Bowel sounds positive.  LBM 4/12 ?Neurologic: CN 2-12 grossly intact. Sensation intact, DTR normal. Strength 5/5 x all 4 extremities.  Ambulates independently ?Psychiatric: Alert and oriented x 3.  Very mild short-term memory deficits ?  ?  ?Data Reviewed: ?Prior notes reviewed, nursing notes reviewed, vital signs reviewed. ?No new labs ?  ?Family Communication:  ?  ?Disposition: ?Status is: Inpatient ?Completed  long-term IV antibiotics 4/11.  Awaiting ALF to confirm financial plan with patient with tentative plan to discharge on 4/18 ?  ?  ?Planned Discharge Destination:  ?ALF ?  ?  ?Medically stable ?No still needs to finish antibiotic ?  ?Inpatient ?For inpatient is because patient needs to continue IV antibiotics and currently is homeless therefore not eligible for home antibiotics ?  ?COVID vaccination status:  ?Moderna: 01/04/2020, 02/01/2020 and 09/22/2020 ?  ?Consultants: ?Cardiology ?Infectious disease ?Orthopedics ?Procedures: ?Echocardiogram ?Removal of reverse shoulder replacement, extensive debridement and placement of antibiotic spacer secondary to infective left prosthetic reverse shoulder replacement.  Wound VAC also placed ?Antibiotics: ?Vancomycin 2/25 >> ?  ?Author: ?[name], NP ?02/17/2022 8:25 AM ?

## 2022-03-05 LAB — BASIC METABOLIC PANEL
Anion gap: 6 (ref 5–15)
BUN: 20 mg/dL (ref 8–23)
CO2: 28 mmol/L (ref 22–32)
Calcium: 9.5 mg/dL (ref 8.9–10.3)
Chloride: 104 mmol/L (ref 98–111)
Creatinine, Ser: 0.96 mg/dL (ref 0.61–1.24)
GFR, Estimated: >60 mL/min (ref >60)
Glucose, Bld: 97 mg/dL (ref 70–99)
Potassium: 5.4 mmol/L — ABNORMAL HIGH (ref 3.5–5.1)
Sodium: 138 mmol/L (ref 135–145)

## 2022-03-05 MED ORDER — DAPAGLIFLOZIN PROPANEDIOL 10 MG PO TABS
10.0000 mg | ORAL_TABLET | Freq: Every day | ORAL | Status: DC
  Administered 2022-03-05 – 2022-03-06 (×2): 10 mg via ORAL
  Filled 2022-03-05 (×3): qty 1

## 2022-03-05 NOTE — Progress Notes (Addendum)
Patient seen and examined, please see extensive note by [name], NP, prolonged hospitalization ?-70/M from [place] with CAD/CABG, history of PE, history of chronic pain and narcotic dependence, homelessness, complicated shoulder history was admitted with prosthetic shoulder infection, completed 6-week antibiotic course inpatient on 4/11, needs Ortho follow-up in 2 weeks for aspiration and repeat ESR, CRP, would be beneficial to have this follow-up in place before his DC. ?-He is clinically compensated as far as chronic systolic and diastolic CHF is concerned,, continue low-dose Lasix, Aldactone, add Farxiga-check labs in a.m. ?-Plan for discharge to ALF on Tuesday noted ? ?Domenic Polite, MD ? ?

## 2022-03-06 LAB — BASIC METABOLIC PANEL
Anion gap: 7 (ref 5–15)
BUN: 19 mg/dL (ref 8–23)
CO2: 28 mmol/L (ref 22–32)
Calcium: 9.3 mg/dL (ref 8.9–10.3)
Chloride: 101 mmol/L (ref 98–111)
Creatinine, Ser: 1.07 mg/dL (ref 0.61–1.24)
GFR, Estimated: >60 mL/min (ref >60)
Glucose, Bld: 100 mg/dL — ABNORMAL HIGH (ref 70–99)
Potassium: 5.5 mmol/L — ABNORMAL HIGH (ref 3.5–5.1)
Sodium: 136 mmol/L (ref 135–145)

## 2022-03-06 MED ORDER — SPIRONOLACTONE 25 MG PO TABS: 25.0000 mg | ORAL_TABLET | Freq: Every day | ORAL | Status: DC

## 2022-03-06 MED ORDER — SODIUM ZIRCONIUM CYCLOSILICATE 5 G PO PACK
5.0000 g | PACK | Freq: Once | ORAL | Status: AC
  Administered 2022-03-06: 5 g via ORAL
  Filled 2022-03-06: qty 1

## 2022-03-06 MED ORDER — ZOLPIDEM TARTRATE 5 MG PO TABS
5.0000 mg | ORAL_TABLET | Freq: Every evening | ORAL | Status: DC | PRN
  Administered 2022-03-06 – 2022-03-07 (×2): 5 mg via ORAL
  Filled 2022-03-06 (×2): qty 1

## 2022-03-06 NOTE — Progress Notes (Addendum)
Patient seen and examined, please see extensive note by [name], NP, prolonged hospitalization ?-70/M from [place] with CAD/CABG, history of PE, history of chronic pain and narcotic dependence, homelessness, complicated shoulder history was admitted with prosthetic shoulder infection, completed 6-week antibiotic course inpatient on 4/11, needs Ortho follow-up in 2 weeks for aspiration and repeat ESR, CRP, would be beneficial to have this follow-up in place before his DC. ?-As far as chronic systolic and diastolic CHF he remains clinically compensated, started on Farxiga yesterday, potassium higher today in the setting of lisinopril, Aldactone and Farxiga,  will stop lisinopril, Lokelma x1 , BMP in a.m.  ?-Asking for zolpidem for sleep ?-Plan for discharge to ALF on Tuesday noted ? ?Domenic Polite, MD ? ?

## 2022-03-06 NOTE — Progress Notes (Signed)
Mobility Specialist: Progress Note ? ? 03/06/22 0956  ?Mobility  ?Activity Ambulated independently in hallway  ?Level of Assistance Independent  ?Assistive Device None  ?LUE Weight Bearing NWB  ?Distance Ambulated (ft) 570 ft  ?Activity Response Tolerated well  ?$Mobility charge 1 Mobility  ? ?Received pt in bed having no complaints and agreeable to mobility. Asymptomatic throughout ambulation, returned back to bed w/ call bell in reach and all needs met. ? ?Jeremiah [name] ?Mobility Specialist ?Mobility Specialist [place]: [phone] ?Mobility Specialist [place]: [phone] ? ?

## 2022-03-07 DIAGNOSIS — E875 Hyperkalemia: Secondary | ICD-10-CM

## 2022-03-07 LAB — BASIC METABOLIC PANEL
Anion gap: 8 (ref 5–15)
BUN: 24 mg/dL — ABNORMAL HIGH (ref 8–23)
CO2: 26 mmol/L (ref 22–32)
Calcium: 9.4 mg/dL (ref 8.9–10.3)
Chloride: 102 mmol/L (ref 98–111)
Creatinine, Ser: 0.94 mg/dL (ref 0.61–1.24)
GFR, Estimated: >60 mL/min (ref >60)
Glucose, Bld: 120 mg/dL — ABNORMAL HIGH (ref 70–99)
Potassium: 4.4 mmol/L (ref 3.5–5.1)
Sodium: 136 mmol/L (ref 135–145)

## 2022-03-07 MED ORDER — SPIRONOLACTONE 25 MG PO TABS
25.0000 mg | ORAL_TABLET | Freq: Every day | ORAL | Status: DC
  Administered 2022-03-07 – 2022-03-08 (×2): 25 mg via ORAL
  Filled 2022-03-07 (×2): qty 1

## 2022-03-07 MED ORDER — SODIUM ZIRCONIUM CYCLOSILICATE 10 G PO PACK: 10.0000 g | PACK | Freq: Once | ORAL | Status: DC

## 2022-03-07 MED ORDER — FUROSEMIDE 20 MG PO TABS
20.0000 mg | ORAL_TABLET | Freq: Every day | ORAL | Status: DC
Start: 2022-03-07 — End: 2022-03-08
  Administered 2022-03-07 – 2022-03-08 (×2): 20 mg via ORAL
  Filled 2022-03-07 (×2): qty 1

## 2022-03-07 MED ORDER — SPIRONOLACTONE 25 MG PO TABS: 25.0000 mg | ORAL_TABLET | Freq: Every day | ORAL | Status: DC

## 2022-03-07 MED ORDER — ASPIRIN 81 MG PO TBEC
81.0000 mg | DELAYED_RELEASE_TABLET | Freq: Every day | ORAL | 11 refills | Status: AC
Start: 2022-03-07 — End: ?

## 2022-03-07 MED ORDER — FUROSEMIDE 20 MG PO TABS: 20.0000 mg | ORAL_TABLET | Freq: Every day | ORAL | 1 refills | Status: DC

## 2022-03-07 NOTE — Progress Notes (Signed)
Mobility Specialist Progress Note  ? ? 03/07/22 1019  ?Mobility  ?Activity Ambulated independently in hallway  ?Level of Assistance Independent  ?Assistive Device None  ?LUE Weight Bearing NWB  ?Distance Ambulated (ft) 200 ft  ?Activity Response Tolerated well  ?$Mobility charge 1 Mobility  ? ?Pt received in chair and agreeable. C/o back pain and the floor hurting his feet. Returned to chair with call bell in reach.   ? ?Hildred Alamin ?Mobility Specialist  ?  ?

## 2022-03-07 NOTE — Progress Notes (Signed)
?Progress Note ?  ?  ?Patient: Jeremiah [name] EUM:[id number] DOB: 03-Apr-1951 DOA: 01/15/2022     33 ?DOS: the patient was seen and examined on 02/17/2022 ?  ?  ?  ?  ?Brief hospital course: ?See previous extensive summary by Dr. Louanne Belton on 02/02/22 ?  ?In short: 71 y.o. M with CAD s/p CABG 98, PE on Eliquis, HTN, chronic pain and opiate dependence previously on Suboxone who presented with shoulder pain.  ?  ?Had been living in [place] for several years, alcohol dependent, at some point had a fall and hematoma of the left chest, that ultimately must have gotten infected because he got a debridement and wound vac.  In the context of that treatment was found to have a chronically dislocated shoulder, he had a prosthetic shoulder replacement in [place].   ?  ?In that context, he moved back to [place], and unfortunately, literally got off the bus with severe pain in his shoulder, came straight here and was found to have a MRSA infection that had entered the prosthetic joint. ?  ?Hospital stay complicated by homelessness as well as patient's lack of motivation regarding contacting his banks in regards to obtaining findings related to his so security retirement check which is enough money to allow him to stay in a nice short stay hotel.  He also does not have a driver's license for which she does not accept accountability for and blames the [employer] for removing his driving privileges. ? ?After further discussion with CM, director for potentially excepting ALF, as well as pastor and other family he has agreed that the best plan is to be discharged to an ALF.  An offer has been made and CM has assisted patient with accessing his bank accounts and his debit card will be mailed directly to the assisted living facility.  Final discharge date at discretion of ALF pending their final approval ?  ?  ?  ?  ?Assessment and Plan: ?*Prosthetic shoulder infection ?-I contacted ID who stated no  additional antibiotics needed after the 6 weeks of IV antibiotics are completed on 4/11. ?-Per Dr. Marlou Sa this is his plan: ?CT scan reviewed. ?Does not look like the glenoid is unreconstructable ?Large fluid collection in the joint.  Plan to aspirate the joint in 2 weeks after he has been off antibiotics. ?Okay to stay in sling until that time ?Anticipate removal of antibiotic spacer and likely no further intervention based on parameters at this time. ?Plan to get CBC DIF sed rate C-reactive protein to gauge his recovery from the infection ? ?  ?Chronic combined systolic CHF (congestive heart failure) ([place]) ?Moderate to severe aortic regurgitation on prior echo now mild ?Aortic root 4.27 cm ?Euvolemic, without symptoms ?EF on echocardiogram this admission 35 to 40% ?No beta-blocker secondary to chronic trifascicular block ?Low-dose Lasix due to difficulty in swallowing potassium tablet ?Farxiga added over the weekend for patient's diabetes since this is indicated in patients with cardiovascular disease/CAD.  Unfortunately he developed hyperkalemia with potassium greater than 5.0.  Initially suspected due to lisinopril/Vasotec but this was discontinued.  Hyperkalemia persisted so [place] and Aldactone.  As of 4/17 potassium back down to 2.4.  Discussed with attending and decision made to continue Lasix but add Aldactone only. ? ?Chronic alcoholism/severe anxiety ?Although patient does demonstrate mild short-term memory loss based on cognitive evaluation from SLP symptoms are mild to severe enough to prohibit independent functioning in the community ?Further information obtained from patient's  brother in [place] by case [employer].  Patient exhibited same symptoms down there but memory loss and inability to function appeared to be selective although cognitive testing here did reveal mild cognitive deficit.  Patient had no trouble navigating outside of the home and obtaining alcohol, spending his Social Security check  irresponsibly and utilizing methamphetamine. ?Continue BuSpar, Wellbutrin XR, Vistaril, and Zanaflex every 8 hours ? ? ?Iron deficiency anemia due to chronic blood loss ?Iron indices low.  Hgb stable.  Was given IV iron. ?Continue oral iron ?  ?  ?CAD (coronary artery disease) ?CAD s/p cabg x4 1998 ?Chronic trifascicular block ?Hyperlipidemia ?Avoid AV nodal blockade ?Continue aspirin and Crestor. ?Can consider Imdur in the future if develops recurring chest pain or if blood pressure becomes an issue. ?  ?Chronic back pain, spinal stenosis with ? opiate dependence and history of substance use disorder ?Postoperatively patient was on a high dose of Oxy IR i.e. 10 mg 6 times a day to equal 60 mg ?Continue MS Contin 15 mg every 12 hours since will be discharging to ALF.  In addition continue Neurontin and Zanaflex ? ?Homelessness ?After lengthy discussion with appropriate parties (see HPI as well as CM note for 4/12) plan is to discharge to ALF.  Contract has been signed and [facility] ?  ? ?  ? ?  ?Subjective:  ?In bed.  No complaints.  Is asking to have someone notifies pastor because he needs a larger size pair of pants and does not have any close to where home because he has gained weight.  He states he wears a 48 x 30 size gene.  Social worker updated and patient's pastor made aware of need for jeans prior to discharge tomorrow ?  ?Physical Exam: ?      ?Vitals:  ?  02/16/22 0504 02/16/22 1944 02/17/22 0448 02/17/22 0600  ?BP: 114/64 (!) 105/44 (!) 107/56    ?Pulse: 73 81 81    ?Resp:   18 18    ?Temp: 99.6 ?F (37.6 ?C) 98.4 ?F (36.9 ?C) 99.2 ?F (37.3 ?C)    ?TempSrc: Oral Oral Oral    ?SpO2: 98% 97% 98%    ?Weight:       82 kg  ?Height:          ?  ?  ?Constitutional: NAD, initially agitated but eventually redirected and became more appropriate in conversation and manner comfortable ?Respiratory: clear to auscultation bilaterally, no wheezing, no crackles. Normal respiratory  effort.  Room air ?Cardiovascular: Regular rate, S1-S2 without S4 gallop, Chronic LLE pitting edema.  RLE edema much more improved. ?Abdomen: no tenderness, no masses palpated. No hepatosplenomegaly. Bowel sounds positive.  LBM 4/12 ?Neurologic: CN 2-12 grossly intact. Sensation intact, DTR normal. Strength 5/5 x all 4 extremities.  Ambulates independently ?Psychiatric: Alert and oriented x 3.  Very mild short-term memory deficits ?  ?  ?Data Reviewed: ?Prior notes reviewed, nursing notes reviewed, vital signs reviewed. ?No new labs ?  ?Family Communication:  ?  ?Disposition: ?Status is: Inpatient ?Completed long-term IV antibiotics 4/11.  Awaiting ALF to confirm financial plan with patient with tentative plan to discharge on 4/18 ?  ?  ?Planned Discharge Destination:  ?ALF ?  ?  ?Medically stable ?No still needs to finish antibiotic ?  ?Inpatient ?Initially because he needed IV antibiotics which have now been completed.  Finally was able to obtain placement in an ALF and should be discharging tomorrow. ?  ?COVID  vaccination status:  ?Moderna: 01/04/2020, 02/01/2020 and 09/22/2020 ?  ?Consultants: ?Cardiology ?Infectious disease ?Orthopedics ?Procedures: ?Echocardiogram ?Removal of reverse shoulder replacement, extensive debridement and placement of antibiotic spacer secondary to infective left prosthetic reverse shoulder replacement.  Wound VAC also placed ?Antibiotics: ?Vancomycin 2/25 >> ?  ?Author: ?[name], NP ?02/17/2022 8:25 AM ?

## 2022-03-07 NOTE — Progress Notes (Addendum)
9:40am: ?CSW spoke with Anitha who confirmed the patient can arrive at the facility tomorrow anytime after 1pm. The address to the home is [place], [place] [address]. ? ?CSW spoke with Loleta Chance of [facility] who states he can provide patient with transportation tomorrow at [place] will obtain some clothing items for patient also. ? ?8:15am: ?CSW attempted to reach Anitha from [place] without success - a voicemail was left requesting a return call. ? ?Madilyn Fireman, MSW, LCSW ?Transitions of Care  Clinical Social Worker II ?[phone] ? ?

## 2022-03-08 MED ORDER — ZOLPIDEM TARTRATE 5 MG PO TABS: 5.0000 mg | ORAL_TABLET | Freq: Every evening | ORAL | 0 refills | Status: DC | PRN

## 2022-03-08 MED ORDER — MAGNESIUM HYDROXIDE 400 MG/5ML PO SUSP
30.0000 mL | Freq: Once | ORAL | Status: AC
  Administered 2022-03-08: 30 mL via ORAL
  Filled 2022-03-08: qty 30

## 2022-03-08 NOTE — Progress Notes (Signed)
[facility] being [facility] by [facility]. Belongings sent with pt. AVS reviewed with pt and packet given to [place]. ?

## 2022-03-08 NOTE — Plan of Care (Signed)

## 2022-03-08 NOTE — Progress Notes (Signed)
Assessed for patient discharge to ALF via family transport. Family can't come until 3pm but he is not discharge lounge appropriate due to contact precautions. RN made aware. ? ?Ramzey Petrovic SWOT-RN ?

## 2022-03-08 NOTE — Progress Notes (Signed)
Mobility Specialist Progress Note: ? ? 03/08/22 1030  ?Mobility  ?Activity Ambulated with assistance in hallway  ?Level of Assistance Independent  ?Assistive Device None  ?Distance Ambulated (ft) 570 ft  ?Activity Response Tolerated well  ?$Mobility charge 1 Mobility  ? ?Pt received in bed willing to participate in mobility. No complaints of pain. Left in chair with call bell in reach and all needs met.  ? ?Jeremiah [name] ?Mobility Specialist ?Primary Phone [phone] ? ?

## 2022-03-08 NOTE — Progress Notes (Signed)
Occupational Therapy Treatment ?Patient Details ?Name: Jeremiah [name] ?MRN: [id number] ?DOB: [birth year] ?Today's Date: 03/08/2022 ? ? ?History of present illness Pt is a 70 y/o M presenting to ED on 2/25 wtih L shoulder wound with pain radiating to chest. Found to have L shoulder anteromedial dislocation after fall 3 months ago. S/p removal of reverse shoulder implant with antibiotic spacer on 2/28. PMH includes CAD s/p bypass, hx of PE and DVT on eliquis, HTN and alcohol abuse. ?  ?OT comments ? Pt making slow progress towards goals, focus of session was review of precautions and exercises for LUE. Pt educated on elbow, wrist, and hand exercises. Reviewed sling wear schedule and provided min A in positioning sling correctly. Reviewed NWB LUE precautions along with no passive/active ROM of shoulder. Pt perseverative about d/c plans during session, however is able to repeat precautions at end of session. Pt presenting with impairments listed below, will follow acutely.   ? ?Recommendations for follow up therapy are one component of a multi-disciplinary discharge planning process, led by the attending physician.  Recommendations may be updated based on patient status, additional functional criteria and insurance authorization. ?   ?Follow Up Recommendations ? No OT follow up  ?  ?Assistance Recommended at Discharge Set up Supervision/Assistance  ?Patient can return home with the following ? Assistance with cooking/housework;Direct supervision/assist for financial management;Assist for transportation;A little help with bathing/dressing/bathroom;Direct supervision/assist for medications management ?  ?Equipment Recommendations ? None recommended by OT  ?  ?Recommendations for Other Services   ? ?  ?Precautions / Restrictions Precautions ?Precautions: Shoulder ?Shoulder Interventions: Shoulder sling/immobilizer;At all times ?Precaution Booklet Issued: Yes (comment) ?Precaution Comments: L shoulder precautions ?Required  Braces or Orthoses: Sling ?Restrictions ?Weight Bearing Restrictions: Yes ?LUE Weight Bearing: Non weight bearing ?Other Position/Activity Restrictions: sling on at all times except for dressing/exercise, AROM of hand/wrist/elbow ok, no shoulder AROM/PROM per MD  ? ? ?  ? ?Mobility Bed Mobility ?Overal bed mobility: Modified Independent ?  ?  ?  ?  ?  ?  ?  ?  ? ?Transfers ?Overall transfer level: Modified independent ?Equipment used: None ?Transfers: Sit to/from Stand ?Sit to Stand: Modified independent (Device/Increase time) ?  ?  ?  ?  ?  ?General transfer comment: no difficulty or LOB ?  ?  ?Balance Overall balance assessment: Modified Independent ?Sitting-balance support: Feet supported ?Sitting balance-Leahy Scale: Normal ?Sitting balance - Comments: able to reach down towards feet to don loose socks without LOB ?  ?Standing balance support: Single extremity supported, During functional activity ?Standing balance-Leahy Scale: Good ?  ?  ?  ?  ?  ?  ?  ?  ?  ?  ?  ?  ?   ? ?ADL either performed or assessed with clinical judgement  ? ?ADL Overall ADL's : Needs assistance/impaired ?Eating/Feeding: Modified independent;Sitting ?  ?  ?  ?  ?  ?  ?  ?Upper Body Dressing : Minimal assistance;Standing ?Upper Body Dressing Details (indicate cue type and reason): to don brace with correct alignment ?  ?  ?Toilet Transfer: Modified Independent ?Toilet Transfer Details (indicate cue type and reason): simulated to chair, no AD ?  ?  ?  ?  ?Functional mobility during ADLs: Modified independent ?  ?  ? ?Extremity/Trunk Assessment Upper Extremity Assessment ?Upper Extremity Assessment: LUE deficits/detail ?LUE Deficits / Details: performed AROM L elbow to hand x 10 ?LUE: Unable to fully assess due to immobilization ?LUE Coordination: decreased gross motor ?  ?  Lower Extremity Assessment ?Lower Extremity Assessment: Defer to PT evaluation ?  ?  ?  ? ?Vision   ?Vision Assessment?: No apparent visual deficits ?  ?Perception  Perception ?Perception: Not tested ?  ?Praxis Praxis ?Praxis: Not tested ?  ? ?Cognition Arousal/Alertness: Awake/alert ?Behavior During Therapy: [facility] for tasks assessed/performed ?Overall Cognitive Status: Impaired/Different from baseline ?Area of Impairment: Memory, Problem solving ?  ?  ?  ?  ?  ?  ?  ?  ?  ?  ?Memory: Decreased short-term memory ?  ?  ?  ?Problem Solving: Requires verbal cues, Difficulty sequencing ?General Comments: pt recalling he has an antibiotic spacer in his shoulder, no joint, did not recall seeing doctor or therapy giving exercises ?  ?  ?   ?Exercises Exercises: General Upper Extremity ?General Exercises - Upper Extremity ?Elbow Flexion: Left, AROM, Seated, 5 reps ?Elbow Extension: AROM, Left, Seated, 5 reps ?Wrist Flexion: AROM, Left, Seated, 5 reps ?Wrist Extension: AROM, Left, Seated, 5 reps ?Digit Composite Flexion: AROM, Left, Seated, 5 reps ?Composite Extension: AROM, Left, Seated, 5 reps ?Other Exercises ?Other Exercises: pronation/supination x10 LUE ? ?  ?Shoulder Instructions   ? ? ?  ?General Comments Session focused on emphasis of LUE precautions and exercises, verbally reviewed compensatory strategies for dressing with pt as he is awaiting clothes. Assisted pt with correct alignment of sling, and wear schedule. Pt able to repeat although, states he was unaware of this, perseverative on his d/c plans.  ? ? ?Pertinent Vitals/ Pain       Pain Assessment ?Pain Assessment: No/denies pain ? ?Home Living   ?  ?  ?  ?  ?  ?  ?  ?  ?  ?  ?  ?  ?  ?  ?  ?  ?  ?  ? ?  ?Prior Functioning/Environment    ?  ?  ?  ?   ? ?Frequency ? Min 1X/week  ? ? ? ? ?  ?Progress Toward Goals ? ?OT Goals(current goals can now be found in the care plan section) ? Progress towards OT goals: Progressing toward goals ? ?Acute Rehab OT Goals ?Patient Stated Goal: none stated ?OT Goal Formulation: With patient ?Time For Goal Achievement: 03/22/22 ?Potential to Achieve Goals: Good ?ADL Goals ?Pt Will Perform  Grooming: Independently;standing ?Pt Will Perform Upper Body Dressing: with modified independence;sitting;standing ?Pt Will Perform Lower Body Dressing: with modified independence;sit to/from stand ?Pt/caregiver will Perform Home Exercise Program: Left upper extremity;With written HEP provided;Independently ?Additional ADL Goal #1: Pt to complete pill box test with 0 errors ?Additional ADL Goal #2: Pt to demo ability to recall 3/3 words > 10 min without cues ?Additional ADL Goal #3: Pt to complete 2-3 step trail making task with no more than min verbal cues  ?Plan Discharge plan remains appropriate;Frequency remains appropriate   ? ?Co-evaluation ? ? ?   ?  ?  ?  ?  ? ?  ?AM-PAC OT "6 Clicks" Daily Activity     ?Outcome Measure ? ? Help from another person eating meals?: None ?Help from another person taking care of personal grooming?: None ?Help from another person toileting, which includes using toliet, bedpan, or urinal?: None ?Help from another person bathing (including washing, rinsing, drying)?: None ?Help from another person to put on and taking off regular upper body clothing?: None ?Help from another person to put on and taking off regular lower body clothing?: None ?6 Click Score: 24 ? ?  ?End  of Session Equipment Utilized During Treatment: Other (comment) (sling) ? ?OT Visit Diagnosis: Other symptoms and signs involving cognitive function ?Pain - Right/Left: Left ?Pain - part of body: Shoulder ?  ?Activity Tolerance Patient tolerated treatment well ?  ?Patient Left in chair;with call bell/phone within reach;with nursing/sitter in room ?  ?Nurse Communication Mobility status ?  ? ?   ? ?Time: 3552-1747 ?OT Time Calculation (min): 19 min ? ?Charges: OT General Charges ?$OT Visit: 1 Visit ?OT Treatments ?$Therapeutic Activity: 8-22 mins ? ?Lynnda Child, OTD, OTR/L ?Acute Rehab ?[phone] ? ? ?Kaylyn Lim ?03/08/2022, 12:25 PM ?

## 2022-03-08 NOTE — Progress Notes (Addendum)
Patient will be discharged to [place] today at 3pm. Patient will be transported via personal vehicle by Loleta Chance of [facility]. The address to the home is [place], [place] [address]. ? ?Patient's prescriptions, AVS, discharge summary, and quantiferon gold results are at the nurse's station. ? ?CSW spoke with Corene Cornea to provide him with address and general discharge instructions.  ? ?CSW spoke with patient at bedside to discuss discharge plan - all questions answered. ? ?RN aware of discharge plan. ? ?Madilyn Fireman, MSW, LCSW ?Transitions of Care  Clinical Social Worker II ?[phone] ? ?

## 2022-03-08 NOTE — Discharge Summary (Signed)
Physician Discharge Summary  ?Jeremiah [name] JKD:[id number] DOB: 06-30-1951 DOA: 01/15/2022 ? ?PCP: Carol Ada, MD ? ?Admit date: 01/15/2022 ?Discharge date: 03/08/2022 ? ?Time spent: 45 minutes ? ?Recommendations for Outpatient Follow-up:  ?Patient will need to follow-up with the community health and wellness clinic in [place] regarding primary care follow-up if physician not available through assisted living facility ?Please call [facility] cardiology at [phone] to reschedule follow-up appointment with Richardson Dopp, PA.  Previous appointment scheduled for April 5 at 2:45 PM but patient had remained in the hospital. ?Patient will need to follow-up with orthopedic surgeon Dr. Marlou Sa in 2 weeks after discharge.  An ambulatory referral has been sent.  If you have not heard from their office in 1 week after arrival please contact them at [phone] ?[facility] ?New sling to left arm until otherwise instructed by Dr. Marlou Sa with orthopedics ?Patient previously instructed that when seated to keep feet elevated at all times.  He has chronic pitting edema of the left lower extremity due to prior fracture making this leg unreliable for evaluating fluid volume overload. ? ? ?Discharge Diagnoses:  ?Principal Problem: ?  Prosthetic joint infection ([place]) ?Active Problems: ?  Acute on chronic combined systolic and diastolic CHF (congestive heart failure) ([place]) ?  Atrial flutter ([place]) ?  Iron deficiency anemia due to chronic blood loss ?  Alcohol abuse ?  Depression ?  History of pulmonary embolism ?  B12 deficiency ?  Hypotension ?  Shoulder wound, left, initial encounter ?  Thoracic aortic aneurysm (TAA) ([place]) ?  CAD (coronary artery disease) ?  Protein-calorie malnutrition, severe ?  Hypokalemia ?  Back pain ?  Wrist pain ?  Chronic pain disorder ?  Hyperkalemia ? ?SEPSIS RULED OUT ? ?Discharge Condition: Stable ? ?Diet recommendation: Heart  healthy ? ?Filed Weights  ? 03/04/22 1115 03/07/22 0500 03/08/22 0500  ?Weight: 84.6 kg 81.6 kg 81.5 kg  ? ? ?History of present illness:  ?71 y.o. M with CAD s/p CABG 98, PE on Eliquis, HTN, chronic pain and opiate dependence previously on Suboxone who presented with shoulder pain.  ?  ?Had been living in [place] for several years, alcohol dependent, at some point had a fall and hematoma of the left chest, that ultimately must have gotten infected because he got a debridement and wound vac.  In the context of that treatment was found to have a chronically dislocated shoulder, he had a prosthetic shoulder replacement in [place].   ?  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 ?  ?Hospital stay complicated by homelessness as well as patient's lack of motivation regarding contacting his banks in regards to obtaining findings related to his so security retirement check which is enough money to allow him to stay in a nice short stay hotel.  He also does not have a driver's license for which she does not accept accountability for and blames the [employer] for removing his driving privileges. ?  ?After further discussion with CM, director for potentially excepting ALF, as well as pastor and other family he has agreed that the best plan is to be discharged to an ALF.  An offer has been made and CM has assisted patient with accessing his bank accounts and his  debit card will be mailed directly to the assisted living facility.  Final discharge date at discretion of ALF pending their final approval ? ?Hospital Course:  ?*Prosthetic shoulder infection ?-I contacted ID who stated no additional antibiotics needed after the 6 weeks of IV antibiotics are completed on 4/11. ?-Per Dr. Marlou Sa this is his plan: ?CT scan  reviewed. ?Does not look like the glenoid is unreconstructable ?Large fluid collection in the joint.  Plan to aspirate the joint in 2 weeks after he has been off antibiotics. ?Okay to stay in sling until that time ?Anticipate removal of antibiotic spacer and likely no further intervention based on parameters at this time. ?Plan to get CBC DIF sed rate C-reactive protein to gauge his recovery from the infection ?  ?  ?Chronic combined systolic CHF (congestive heart failure) ([place]) ?Moderate to severe aortic regurgitation on prior echo now mild ?Aortic root 4.27 cm ?Euvolemic, without symptoms ?EF on echocardiogram this admission 35 to 40% ?No beta-blocker secondary to chronic trifascicular block ?Low-dose Lasix due to difficulty in swallowing potassium tablet ?Farxiga added over the weekend for patient's diabetes since this is indicated in patients with cardiovascular disease/CAD.  Unfortunately he developed hyperkalemia with potassium greater than 5.0.  Initially suspected due to lisinopril/Vasotec but this was discontinued.  Hyperkalemia persisted so [place] and Aldactone.  As of 4/17 potassium back down to 4.4.  Discussed with attending and decision made to continue Lasix but add Aldactone only. ? ?Chronic insomnia ?Continue Ambien ?  ?Chronic alcoholism/severe anxiety ?Although patient does demonstrate mild short-term memory loss based on cognitive evaluation from [facility] symptoms are mild to severe enough to prohibit independent functioning in the community ?Further information obtained from patient's brother in [place] by case manager.  Patient exhibited same symptoms down there but memory loss and inability to function appeared to be selective although cognitive testing here did reveal mild cognitive deficit.  Patient had no trouble navigating outside of the home and obtaining alcohol, spending his Social Security check irresponsibly and utilizing methamphetamine. ?Continue BuSpar, Wellbutrin XR, Vistaril, and  Zanaflex every 8 hours ?  ?  ?Iron deficiency anemia due to chronic blood loss ?Iron indices low.  Hgb stable.  Was given IV iron. ?Continue oral iron ?  ?  ?CAD (coronary artery disease) ?CAD s/p cabg x4 1998 ?Chronic trifascicular block ?Hyperlipidemia ?Avoid AV nodal blockade ?Continue aspirin and Crestor. ?Can consider Imdur in the future if develops recurring chest pain or if blood pressure becomes an issue. ?  ?Chronic back pain, spinal stenosis with ? opiate dependence and history of substance use disorder ?Postoperatively patient was on a high dose of Oxy IR i.e. 10 mg 6 times a day to equal 60 mg ?Continue MS Contin 15 mg every 12 hours since will be discharging to ALF.  In addition continue Neurontin and Zanaflex ? ?Chronic insomnia ?Patient resumed on Ambien on 4/17 by attending physician ? ?Obstipation. ?Last bowel movement on 4/12 ?30 cc of milk of magnesia prior to discharge ?  ?Homelessness ?After lengthy discussion with appropriate parties (see HPI as well as CM note for 4/12) plan is to discharge to ALF.  Contract has been signed and patient will be discharging to [facility] 4/18 ? ?Procedures: ?Echocardiogram ?Removal of reverse shoulder replacement, extensive debridement and placement of antibiotic spacer secondary to infective left prosthetic reverse shoulder replacement.  Wound VAC also placed (i.e. Studies not automatically included, echos, thoracentesis, etc; not x-rays) ? ?Consultations: ?Cardiology ?Infectious disease ?Orthopedics ? ?Antibiotics: ?Vancomycin 2/25  through 4/11 ?  ? ?Discharge Exam: ?Vitals:  ? 03/08/22 0327 03/08/22 0750  ?BP: (!) 141/60 128/82  ?Pulse: (!) 59 79  ?Resp: 18 18  ?Temp: 98 ?F (36.7 ?C) 98.3 ?F (36.8 ?C)  ?SpO2: 97% 93%  ? ?  ?Constitutional: NAD, initially agitated but eventually redirected and became more appropriate in conversation and manner comfortable ?Respiratory: clear to auscultation bilaterally, no wheezing, no crackles. Normal respiratory effort.  Room  air ?Cardiovascular: Regular rate, S1-S2 without S4 gallop, Chronic LLE pitting edema.  RLE edema much more improved. ?Abdomen: no tenderness, no masses palpated. No hepatosplenomegaly. Bowel sounds positive.  LBM 4/12

## 2022-03-10 ENCOUNTER — Other Ambulatory Visit (HOSPITAL_COMMUNITY): Payer: Self-pay

## 2022-03-22 ENCOUNTER — Encounter (HOSPITAL_COMMUNITY): Payer: Self-pay

## 2022-03-22 ENCOUNTER — Other Ambulatory Visit: Payer: Self-pay

## 2022-03-22 ENCOUNTER — Observation Stay (HOSPITAL_COMMUNITY): Payer: Medicare Other

## 2022-03-22 ENCOUNTER — Inpatient Hospital Stay (HOSPITAL_COMMUNITY)
Admission: EM | Admit: 2022-03-22 | Discharge: 2022-03-25 | DRG: 641 | Disposition: A | Discharge status: Nursing Home (Includes ICF) | Payer: Medicare Other | Attending: Internal Medicine | Admitting: Internal Medicine

## 2022-03-22 ENCOUNTER — Emergency Department (HOSPITAL_COMMUNITY): Payer: Medicare Other

## 2022-03-22 DIAGNOSIS — Z7982 Long term (current) use of aspirin: Secondary | ICD-10-CM

## 2022-03-22 DIAGNOSIS — I429 Cardiomyopathy, unspecified: Secondary | ICD-10-CM | POA: Diagnosis present

## 2022-03-22 DIAGNOSIS — I2510 Atherosclerotic heart disease of native coronary artery without angina pectoris: Secondary | ICD-10-CM | POA: Diagnosis present

## 2022-03-22 DIAGNOSIS — I453 Trifascicular block: Secondary | ICD-10-CM | POA: Diagnosis present

## 2022-03-22 DIAGNOSIS — I5042 Chronic combined systolic (congestive) and diastolic (congestive) heart failure: Secondary | ICD-10-CM

## 2022-03-22 DIAGNOSIS — G8929 Other chronic pain: Secondary | ICD-10-CM | POA: Diagnosis present

## 2022-03-22 DIAGNOSIS — Z8614 Personal history of Methicillin resistant Staphylococcus aureus infection: Secondary | ICD-10-CM

## 2022-03-22 DIAGNOSIS — Z96619 Presence of unspecified artificial shoulder joint: Secondary | ICD-10-CM | POA: Diagnosis present

## 2022-03-22 DIAGNOSIS — E860 Dehydration: Principal | ICD-10-CM | POA: Diagnosis present

## 2022-03-22 DIAGNOSIS — Z8679 Personal history of other diseases of the circulatory system: Secondary | ICD-10-CM

## 2022-03-22 DIAGNOSIS — R001 Bradycardia, unspecified: Secondary | ICD-10-CM | POA: Diagnosis not present

## 2022-03-22 DIAGNOSIS — Z9181 History of falling: Secondary | ICD-10-CM

## 2022-03-22 DIAGNOSIS — I959 Hypotension, unspecified: Secondary | ICD-10-CM | POA: Diagnosis present

## 2022-03-22 DIAGNOSIS — E7800 Pure hypercholesterolemia, unspecified: Secondary | ICD-10-CM | POA: Diagnosis present

## 2022-03-22 DIAGNOSIS — M109 Gout, unspecified: Secondary | ICD-10-CM | POA: Diagnosis present

## 2022-03-22 DIAGNOSIS — Z885 Allergy status to narcotic agent status: Secondary | ICD-10-CM

## 2022-03-22 DIAGNOSIS — Z951 Presence of aortocoronary bypass graft: Secondary | ICD-10-CM | POA: Diagnosis not present

## 2022-03-22 DIAGNOSIS — T8459XA Infection and inflammatory reaction due to other internal joint prosthesis, initial encounter: Secondary | ICD-10-CM | POA: Diagnosis present

## 2022-03-22 DIAGNOSIS — I248 Other forms of acute ischemic heart disease: Secondary | ICD-10-CM | POA: Diagnosis present

## 2022-03-22 DIAGNOSIS — R55 Syncope and collapse: Secondary | ICD-10-CM | POA: Diagnosis not present

## 2022-03-22 DIAGNOSIS — I110 Hypertensive heart disease with heart failure: Secondary | ICD-10-CM | POA: Diagnosis present

## 2022-03-22 DIAGNOSIS — I440 Atrioventricular block, first degree: Secondary | ICD-10-CM | POA: Diagnosis not present

## 2022-03-22 DIAGNOSIS — T8450XA Infection and inflammatory reaction due to unspecified internal joint prosthesis, initial encounter: Secondary | ICD-10-CM | POA: Diagnosis present

## 2022-03-22 DIAGNOSIS — E872 Acidosis, unspecified: Secondary | ICD-10-CM | POA: Diagnosis present

## 2022-03-22 DIAGNOSIS — I4892 Unspecified atrial flutter: Secondary | ICD-10-CM | POA: Diagnosis present

## 2022-03-22 DIAGNOSIS — I10 Essential (primary) hypertension: Secondary | ICD-10-CM | POA: Diagnosis present

## 2022-03-22 DIAGNOSIS — N179 Acute kidney failure, unspecified: Secondary | ICD-10-CM | POA: Diagnosis not present

## 2022-03-22 DIAGNOSIS — Z79899 Other long term (current) drug therapy: Secondary | ICD-10-CM

## 2022-03-22 DIAGNOSIS — Z9884 Bariatric surgery status: Secondary | ICD-10-CM

## 2022-03-22 DIAGNOSIS — F1010 Alcohol abuse, uncomplicated: Secondary | ICD-10-CM | POA: Diagnosis present

## 2022-03-22 DIAGNOSIS — Z5900 Homelessness unspecified: Secondary | ICD-10-CM

## 2022-03-22 DIAGNOSIS — Z8249 Family history of ischemic heart disease and other diseases of the circulatory system: Secondary | ICD-10-CM

## 2022-03-22 DIAGNOSIS — Z86711 Personal history of pulmonary embolism: Secondary | ICD-10-CM

## 2022-03-22 DIAGNOSIS — M7989 Other specified soft tissue disorders: Secondary | ICD-10-CM | POA: Diagnosis present

## 2022-03-22 DIAGNOSIS — Z96651 Presence of right artificial knee joint: Secondary | ICD-10-CM | POA: Diagnosis present

## 2022-03-22 LAB — LACTIC ACID, PLASMA
Lactic Acid, Venous: 3.4 mmol/L (ref 0.5–1.9)
Lactic Acid, Venous: 2.2 mmol/L (ref 0.5–1.9)

## 2022-03-22 LAB — CBC
HCT: 45 % (ref 39.0–52.0)
Hemoglobin: 13.4 g/dL (ref 13.0–17.0)
MCH: 25.9 pg — ABNORMAL LOW (ref 26.0–34.0)
MCHC: 29.8 g/dL — ABNORMAL LOW (ref 30.0–36.0)
MCV: 86.9 fL (ref 80.0–100.0)
Platelets: 211 10*3/uL (ref 150–400)
RBC: 5.18 MIL/uL (ref 4.22–5.81)
RDW: 19.2 % — ABNORMAL HIGH (ref 11.5–15.5)
WBC: 6.5 10*3/uL (ref 4.0–10.5)
nRBC: 0 % (ref 0.0–0.2)

## 2022-03-22 LAB — BASIC METABOLIC PANEL
Anion gap: 15 (ref 5–15)
BUN: 32 mg/dL — ABNORMAL HIGH (ref 8–23)
CO2: 18 mmol/L — ABNORMAL LOW (ref 22–32)
Calcium: 8.9 mg/dL (ref 8.9–10.3)
Chloride: 103 mmol/L (ref 98–111)
Creatinine, Ser: 1.46 mg/dL — ABNORMAL HIGH (ref 0.61–1.24)
GFR, Estimated: 52 mL/min — ABNORMAL LOW (ref >60)
Glucose, Bld: 187 mg/dL — ABNORMAL HIGH (ref 70–99)
Potassium: 4.4 mmol/L (ref 3.5–5.1)
Sodium: 136 mmol/L (ref 135–145)

## 2022-03-22 LAB — URINALYSIS, ROUTINE W REFLEX MICROSCOPIC
Bilirubin Urine: NEGATIVE
Glucose, UA: NEGATIVE mg/dL
Hgb urine dipstick: NEGATIVE
Ketones, ur: NEGATIVE mg/dL
Leukocytes,Ua: NEGATIVE
Nitrite: NEGATIVE
Protein, ur: NEGATIVE mg/dL
Specific Gravity, Urine: 1.028 (ref 1.005–1.030)
pH: 5 (ref 5.0–8.0)

## 2022-03-22 LAB — RAPID URINE DRUG SCREEN, HOSP PERFORMED
Amphetamines: NOT DETECTED
Barbiturates: NOT DETECTED
Benzodiazepines: NOT DETECTED
Cocaine: NOT DETECTED
Opiates: POSITIVE — AB
Tetrahydrocannabinol: NOT DETECTED

## 2022-03-22 LAB — CBG MONITORING, ED: Glucose-Capillary: 182 mg/dL — ABNORMAL HIGH (ref 70–99)

## 2022-03-22 LAB — TROPONIN I (HIGH SENSITIVITY)
Troponin I (High Sensitivity): 19 ng/L — ABNORMAL HIGH (ref <18)
Troponin I (High Sensitivity): 52 ng/L — ABNORMAL HIGH (ref <18)
Troponin I (High Sensitivity): 91 ng/L — ABNORMAL HIGH (ref <18)

## 2022-03-22 LAB — MAGNESIUM: Magnesium: 1.9 mg/dL (ref 1.7–2.4)

## 2022-03-22 MED ORDER — ONDANSETRON HCL 4 MG/2ML IJ SOLN
4.0000 mg | Freq: Once | INTRAMUSCULAR | Status: AC
Start: 2022-03-22 — End: 2022-03-22
  Administered 2022-03-22: 4 mg via INTRAVENOUS
  Filled 2022-03-22: qty 2

## 2022-03-22 MED ORDER — IOHEXOL 350 MG/ML SOLN
100.0000 mL | Freq: Once | INTRAVENOUS | Status: AC | PRN
  Administered 2022-03-22: 80 mL via INTRAVENOUS

## 2022-03-22 MED ORDER — SODIUM CHLORIDE 0.9 % IV SOLN: INTRAVENOUS | Status: AC

## 2022-03-22 MED ORDER — ASPIRIN 325 MG PO TABS
325.0000 mg | ORAL_TABLET | Freq: Once | ORAL | Status: AC
  Administered 2022-03-22: 325 mg via ORAL
  Filled 2022-03-22: qty 1

## 2022-03-22 MED ORDER — SODIUM CHLORIDE 0.9 % IV SOLN
1.0000 g | Freq: Once | INTRAVENOUS | Status: AC
  Administered 2022-03-22: 1 g via INTRAVENOUS
  Filled 2022-03-22: qty 10

## 2022-03-22 MED ORDER — HYDROXYZINE HCL 25 MG PO TABS
50.0000 mg | ORAL_TABLET | Freq: Three times a day (TID) | ORAL | Status: DC
  Administered 2022-03-22 – 2022-03-23 (×2): 50 mg via ORAL
  Filled 2022-03-22 (×2): qty 2

## 2022-03-22 MED ORDER — ROSUVASTATIN CALCIUM 20 MG PO TABS
40.0000 mg | ORAL_TABLET | Freq: Every day | ORAL | Status: DC
Start: 2022-03-23 — End: 2022-03-25
  Administered 2022-03-23 – 2022-03-25 (×3): 40 mg via ORAL
  Filled 2022-03-22 (×3): qty 2

## 2022-03-22 MED ORDER — GABAPENTIN 300 MG PO CAPS
300.0000 mg | ORAL_CAPSULE | Freq: Three times a day (TID) | ORAL | Status: DC
  Administered 2022-03-22 – 2022-03-25 (×8): 300 mg via ORAL
  Filled 2022-03-22 (×8): qty 1

## 2022-03-22 MED ORDER — BUSPIRONE HCL 5 MG PO TABS: 15.0000 mg | ORAL_TABLET | Freq: Three times a day (TID) | ORAL | Status: DC

## 2022-03-22 MED ORDER — BUPROPION HCL ER (XL) 300 MG PO TB24: 300.0000 mg | ORAL_TABLET | Freq: Every morning | ORAL | Status: DC

## 2022-03-22 MED ORDER — ACETAMINOPHEN 650 MG RE SUPP: 650.0000 mg | Freq: Four times a day (QID) | RECTAL | Status: DC | PRN

## 2022-03-22 MED ORDER — ACETAMINOPHEN 325 MG PO TABS: 650.0000 mg | ORAL_TABLET | Freq: Four times a day (QID) | ORAL | Status: DC | PRN

## 2022-03-22 MED ORDER — MORPHINE SULFATE ER 15 MG PO TBCR
15.0000 mg | EXTENDED_RELEASE_TABLET | Freq: Two times a day (BID) | ORAL | Status: DC
  Administered 2022-03-22 – 2022-03-25 (×6): 15 mg via ORAL
  Filled 2022-03-22 (×6): qty 1

## 2022-03-22 MED ORDER — ZOLPIDEM TARTRATE 5 MG PO TABS
5.0000 mg | ORAL_TABLET | Freq: Every evening | ORAL | Status: DC | PRN
  Administered 2022-03-22 – 2022-03-24 (×3): 5 mg via ORAL
  Filled 2022-03-22 (×3): qty 1

## 2022-03-22 MED ORDER — SODIUM CHLORIDE 0.9 % IV BOLUS
1000.0000 mL | Freq: Once | INTRAVENOUS | Status: AC
  Administered 2022-03-22: 1000 mL via INTRAVENOUS

## 2022-03-22 MED ORDER — PANTOPRAZOLE SODIUM 40 MG PO TBEC
40.0000 mg | DELAYED_RELEASE_TABLET | Freq: Two times a day (BID) | ORAL | Status: DC
  Administered 2022-03-22 – 2022-03-25 (×6): 40 mg via ORAL
  Filled 2022-03-22 (×6): qty 1

## 2022-03-22 MED ORDER — ENOXAPARIN SODIUM 40 MG/0.4ML IJ SOSY
40.0000 mg | PREFILLED_SYRINGE | INTRAMUSCULAR | Status: DC
  Administered 2022-03-22 – 2022-03-24 (×3): 40 mg via SUBCUTANEOUS
  Filled 2022-03-22 (×3): qty 0.4

## 2022-03-22 MED ORDER — POLYETHYLENE GLYCOL 3350 17 G PO PACK: 17.0000 g | PACK | Freq: Every day | ORAL | Status: DC | PRN

## 2022-03-22 MED ORDER — ALLOPURINOL 100 MG PO TABS
100.0000 mg | ORAL_TABLET | Freq: Every day | ORAL | Status: DC
  Administered 2022-03-23 – 2022-03-25 (×3): 100 mg via ORAL
  Filled 2022-03-22 (×3): qty 1

## 2022-03-22 MED ORDER — SODIUM CHLORIDE 0.9 % IV SOLN: INTRAVENOUS | Status: DC

## 2022-03-22 MED ORDER — DOCUSATE SODIUM 100 MG PO CAPS
100.0000 mg | ORAL_CAPSULE | Freq: Two times a day (BID) | ORAL | Status: DC
  Administered 2022-03-22 – 2022-03-25 (×6): 100 mg via ORAL
  Filled 2022-03-22 (×6): qty 1

## 2022-03-22 NOTE — Assessment & Plan Note (Signed)
Presents with hypertension syncope AKI and lactic acidosis. ?-Hold Enalapril 5 mg daily, Lasix, 20 mg daily, spironolactone 25 mg daily ?

## 2022-03-22 NOTE — Assessment & Plan Note (Addendum)
Syncope likely secondary to hypotension, blood pressure documented down to 83 systolic.  Reports good oral intake.  CTA chest gated for PE, suggest scarring versus interstitial pneumonia.  He is unaware of what exact medications he takes, but Lasix 20 mg daily listed on med list.  EKG without significant changes. ?-Recent echo 12/2021 shows EF of 35 to 40%, indeterminate LV diastolic parameters, normal PA pressure no significant valvular disease. ?-Mild troponin elevation 19 >> 52 trend ?- EDP talked to Dr. Stanford Breed, did not feel patient needed to be admitted to [facility], recommended for cycling troponins, cardiology consult in the morning. ?-Syncope with unwitnessed fall, obtain head CT ?-Antibiotics held for now ?

## 2022-03-22 NOTE — ED Provider Notes (Signed)
?[facility] ?Provider Note ? ? ?CSN: [id number] ?Arrival date & time: 03/22/22  1207 ? ?  ? ?History ? ?Chief Complaint  ?Patient presents with  ? Loss of Consciousness  ? ? ?Jeremiah [name] is a 71 y.o. male with a history including hypertension, CAD, with CABG in 1998 history of AAA, CHF, atrial flutter history of pulmonary embolism, prior substance abuse presenting for evaluation of a syncopal event which occurred just prior to arrival.  He reports taking a shower after which he felt a little lightheaded but this symptom improved, and then sometime later this morning became lightheaded again in association with shortness of breath, he sat down after which he completely passed out.  He denies having chest pain or palpitations before or during this episode and denies shortness of breath currently, but does have significant nausea without emesis currently.  He arrived by EMS who noted significant hypotension, he is 83/58 upon arrival.  He states he typically has hypertension but was told he probably is dehydrated.  He denies reduced p.o. intake however.  He has had no fevers or chills. ? ?Review of chart also revealing for a recent prolonged hospital stay secondary to MRSA infection in his left prosthetic shoulder joint.  He was discharged about 2 weeks ago to an assisted living facility. ? ?The history is provided by the patient.  ? ?  ? ?Home Medications ?Prior to Admission medications   ?Medication Sig Start Date End Date Taking? Authorizing Provider  ?acetaminophen (TYLENOL) 325 MG tablet Take 2 tablets (650 mg total) by mouth every 6 (six) hours as needed for mild pain, fever or headache. 03/02/22  Yes Samella Parr, NP  ?allopurinol (ZYLOPRIM) 100 MG tablet Take 1 tablet (100 mg total) by mouth daily. 03/03/22  Yes Samella Parr, NP  ?aspirin EC 81 MG EC tablet Take 1 tablet (81 mg total) by mouth daily. Swallow whole. 03/07/22  Yes Samella Parr, NP  ?buPROPion (WELLBUTRIN XL) 300 MG  24 hr tablet Take 1 tablet (300 mg total) by mouth every morning. 03/02/22  Yes Samella Parr, NP  ?busPIRone (BUSPAR) 15 MG tablet Take 1 tablet (15 mg total) by mouth 3 (three) times daily. 03/02/22  Yes Samella Parr, NP  ?cholecalciferol (VITAMIN D) 25 MCG tablet Take 1 tablet (1,000 Units total) by mouth daily. 03/03/22  Yes Samella Parr, NP  ?docusate sodium (COLACE) 100 MG capsule Take 1 capsule (100 mg total) by mouth 2 (two) times daily. 03/02/22  Yes Samella Parr, NP  ?enalapril (VASOTEC) 5 MG tablet Take 5 mg by mouth daily.   Yes [provider]  ?ferrous sulfate 325 (65 FE) MG tablet Take 1 tablet (325 mg total) by mouth every other day. 03/04/22  Yes Samella Parr, NP  ?furosemide (LASIX) 20 MG tablet Take 1 tablet (20 mg total) by mouth daily. 03/07/22  Yes Samella Parr, NP  ?gabapentin (NEURONTIN) 300 MG capsule Take 1 capsule (300 mg total) by mouth 3 (three) times daily. 03/02/22  Yes Samella Parr, NP  ?hydrOXYzine (ATARAX) 10 MG tablet Take 1 tablet (10 mg total) by mouth every 4 (four) hours as needed for anxiety. ?Patient taking differently: Take 10 mg by mouth 3 (three) times daily as needed for anxiety. 03/02/22  Yes Samella Parr, NP  ?hydrOXYzine (ATARAX) 50 MG tablet Take 1 tablet (50 mg total) by mouth 3 (three) times daily. 03/02/22  Yes Samella Parr, NP  ?  morphine (MS CONTIN) 15 MG 12 hr tablet Take 1 tablet (15 mg total) by mouth every 12 (twelve) hours. 03/02/22  Yes Samella Parr, NP  ?Multiple Vitamin (MULTIVITAMIN WITH MINERALS) TABS tablet Take 1 tablet by mouth 2 (two) times daily. 03/02/22  Yes Samella Parr, NP  ?pantoprazole (PROTONIX) 40 MG tablet Take 1 tablet (40 mg total) by mouth 2 (two) times daily. 03/02/22  Yes Samella Parr, NP  ?rosuvastatin (CRESTOR) 40 MG tablet Take 1 tablet (40 mg total) by mouth daily. 03/02/22  Yes Samella Parr, NP  ?spironolactone (ALDACTONE) 25 MG tablet Take 1 tablet (25 mg total) by mouth daily.  03/07/22  Yes Samella Parr, NP  ?vitamin B-12 1000 MCG tablet Take 1 tablet (1,000 mcg total) by mouth daily. 03/03/22  Yes Samella Parr, NP  ?zolpidem (AMBIEN) 5 MG tablet Take 1 tablet (5 mg total) by mouth at bedtime as needed for sleep. 03/08/22  Yes Samella Parr, NP  ?Ensure Max Protein (ENSURE MAX PROTEIN) LIQD Take 330 mLs (11 oz total) by mouth 2 (two) times daily at 10 AM and 5 PM. ?Patient not taking: Reported on 03/22/2022 03/02/22   Samella Parr, NP  ?lidocaine (LIDODERM) 5 % Place 1 patch onto the skin daily. Remove & Discard patch within 12 hours or as directed by MD ?Patient not taking: Reported on 03/22/2022 03/03/22   Samella Parr, NP  ?melatonin 5 MG TABS Take 1 tablet (5 mg total) by mouth at bedtime. ?Patient not taking: Reported on 03/22/2022 03/02/22   Samella Parr, NP  ?QUEtiapine (SEROQUEL) 50 MG tablet Take 1 tablet (50 mg total) by mouth at bedtime. ?Patient not taking: Reported on 03/22/2022 03/02/22   Samella Parr, NP  ?tiZANidine (ZANAFLEX) 2 MG tablet Take 1 tablet (2 mg total) by mouth every 8 (eight) hours. ?Patient not taking: Reported on 03/22/2022 03/02/22   Samella Parr, NP  ?   ? ?Allergies    ?Morphine   ? ?Review of Systems   ?Review of Systems  ?Constitutional:  Negative for chills and fever.  ?HENT:  Negative for congestion.   ?Eyes: Negative.   ?Respiratory:  Positive for shortness of breath. Negative for chest tightness.   ?Cardiovascular:  Negative for chest pain and palpitations.  ?Gastrointestinal:  Positive for nausea. Negative for abdominal pain and vomiting.  ?Genitourinary: Negative.  Negative for dysuria.  ?Musculoskeletal:  Positive for arthralgias. Negative for joint swelling and neck pain.  ?Skin: Negative.  Negative for rash and wound.  ?Neurological:  Positive for syncope. Negative for dizziness, weakness, light-headedness, numbness and headaches.  ?Psychiatric/Behavioral: Negative.    ? ?Physical Exam ?Updated Vital Signs ?BP 112/79 (BP Location:  Left Arm)   Pulse 82   Temp 97.9 ?F (36.6 ?C) (Oral)   Resp 14   Ht '5\' 7"'$  (1.702 m)   Wt 68 kg   SpO2 100%   BMI 23.49 kg/m?  ?Physical Exam ?Vitals and nursing note reviewed.  ?Constitutional:   ?   Appearance: He is well-developed.  ?HENT:  ?   Head: Normocephalic and atraumatic.  ?Eyes:  ?   Conjunctiva/sclera: Conjunctivae normal.  ?Cardiovascular:  ?   Rate and Rhythm: Normal rate and regular rhythm.  ?   Heart sounds: Normal heart sounds.  ?Pulmonary:  ?   Effort: Pulmonary effort is normal.  ?   Breath sounds: Normal breath sounds. No wheezing.  ?Abdominal:  ?   General: Bowel sounds are  normal.  ?   Palpations: Abdomen is soft.  ?   Tenderness: There is no abdominal tenderness.  ?Musculoskeletal:     ?   General: Normal range of motion.  ?   Cervical back: Normal range of motion.  ?Skin: ?   General: Skin is warm and dry.  ?Neurological:  ?   Mental Status: He is alert.  ? ? ?ED Results / Procedures / Treatments   ?Labs ?(all labs ordered are listed, but only abnormal results are displayed) ?Labs Reviewed  ?BASIC METABOLIC PANEL - Abnormal; Notable for the following components:  ?    Result Value  ? CO2 18 (*)   ? Glucose, Bld 187 (*)   ? BUN 32 (*)   ? Creatinine, Ser 1.46 (*)   ? GFR, Estimated 52 (*)   ? All other components within normal limits  ?CBC - Abnormal; Notable for the following components:  ? MCH 25.9 (*)   ? MCHC 29.8 (*)   ? RDW 19.2 (*)   ? All other components within normal limits  ?LACTIC ACID, PLASMA - Abnormal; Notable for the following components:  ? Lactic Acid, Venous 3.4 (*)   ? All other components within normal limits  ?LACTIC ACID, PLASMA - Abnormal; Notable for the following components:  ? Lactic Acid, Venous 2.2 (*)   ? All other components within normal limits  ?CBG MONITORING, ED - Abnormal; Notable for the following components:  ? Glucose-Capillary 182 (*)   ? All other components within normal limits  ?TROPONIN I (HIGH SENSITIVITY) - Abnormal; Notable for the  following components:  ? Troponin I (High Sensitivity) 19 (*)   ? All other components within normal limits  ?TROPONIN I (HIGH SENSITIVITY) - Abnormal; Notable for the following components:  ? Troponin I (High [employer]

## 2022-03-22 NOTE — Plan of Care (Signed)
Patient admitted to unit via stretcher, ambulatory and able to communicate needs. Weighed, ambulated to bathroom and provided hydration. Oriented to room and equipment and hospital staff. Call bell and belongings within reach.  ?Problem: Education: ?Goal: Knowledge of General Education information will improve ?Description: Including pain rating scale, medication(s)/side effects and non-pharmacologic comfort measures ?Outcome: Progressing ?  ?Problem: Health Behavior/Discharge Planning: ?Goal: Ability to manage health-related needs will improve ?Outcome: Progressing ?  ?Problem: Clinical Measurements: ?Goal: Ability to maintain clinical measurements within normal limits will improve ?Outcome: Progressing ?Goal: Will remain free from infection ?Outcome: Progressing ?Goal: Diagnostic test results will improve ?Outcome: Progressing ?Goal: Respiratory complications will improve ?Outcome: Progressing ?Goal: Cardiovascular complication will be avoided ?Outcome: Progressing ?  ?Problem: Activity: ?Goal: Risk for activity intolerance will decrease ?Outcome: Progressing ?  ?Problem: Nutrition: ?Goal: Adequate nutrition will be maintained ?Outcome: Progressing ?  ?Problem: Coping: ?Goal: Level of anxiety will decrease ?Outcome: Progressing ?  ?Problem: Elimination: ?Goal: Will not experience complications related to bowel motility ?Outcome: Progressing ?Goal: Will not experience complications related to urinary retention ?Outcome: Progressing ?  ?Problem: Pain Managment: ?Goal: General experience of comfort will improve ?Outcome: Progressing ?  ?Problem: Safety: ?Goal: Ability to remain free from injury will improve ?Outcome: Progressing ?  ?Problem: Skin Integrity: ?Goal: Risk for impaired skin integrity will decrease ?Outcome: Progressing ?  ?

## 2022-03-22 NOTE — ED Triage Notes (Signed)
Patient via EMS from [facility] due to syncopal episode and hypotension today.  ?
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
## 2022-03-22 NOTE — Assessment & Plan Note (Addendum)
Creatinine elevated 1.46 from baseline 0.8-1.  With lactic acidosis 3.4 > 2.2. ?-1 L bolus given, N/s 50cc/hr x 20hrs ?

## 2022-03-22 NOTE — Assessment & Plan Note (Signed)
Currently in sinus rhythm.  Not on anticoagulation ?

## 2022-03-22 NOTE — ED Notes (Signed)
Report called to [place], RN on 300 ?

## 2022-03-22 NOTE — Assessment & Plan Note (Addendum)
Recent prolonged hospitalization.  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  He underwent removal of reverse shoulder replacement with extensive debridement and placement of antibiotic spacer. ?-Patient completed 6 week course of antibiotics 4/11 ?

## 2022-03-22 NOTE — ED Notes (Signed)
Date and time results received: 03/22/22 3:07 PM ? ? ?Test: LA ?Critical Value: 3.4 ? ?Name of Provider Notified: Evalee Jefferson ? ?Orders Received? Or Actions Taken?: see orders ?

## 2022-03-23 ENCOUNTER — Encounter: Payer: Medicare Other | Admitting: Orthopedic Surgery

## 2022-03-23 DIAGNOSIS — G8929 Other chronic pain: Secondary | ICD-10-CM | POA: Diagnosis present

## 2022-03-23 DIAGNOSIS — F1010 Alcohol abuse, uncomplicated: Secondary | ICD-10-CM | POA: Diagnosis present

## 2022-03-23 DIAGNOSIS — I5042 Chronic combined systolic (congestive) and diastolic (congestive) heart failure: Secondary | ICD-10-CM

## 2022-03-23 DIAGNOSIS — I2510 Atherosclerotic heart disease of native coronary artery without angina pectoris: Secondary | ICD-10-CM | POA: Diagnosis present

## 2022-03-23 DIAGNOSIS — I4892 Unspecified atrial flutter: Secondary | ICD-10-CM | POA: Diagnosis present

## 2022-03-23 DIAGNOSIS — E860 Dehydration: Secondary | ICD-10-CM | POA: Diagnosis present

## 2022-03-23 DIAGNOSIS — I483 Typical atrial flutter: Secondary | ICD-10-CM

## 2022-03-23 DIAGNOSIS — E7800 Pure hypercholesterolemia, unspecified: Secondary | ICD-10-CM | POA: Diagnosis present

## 2022-03-23 DIAGNOSIS — Z8679 Personal history of other diseases of the circulatory system: Secondary | ICD-10-CM | POA: Diagnosis not present

## 2022-03-23 DIAGNOSIS — I429 Cardiomyopathy, unspecified: Secondary | ICD-10-CM | POA: Diagnosis present

## 2022-03-23 DIAGNOSIS — N179 Acute kidney failure, unspecified: Secondary | ICD-10-CM | POA: Diagnosis present

## 2022-03-23 DIAGNOSIS — Z8614 Personal history of Methicillin resistant Staphylococcus aureus infection: Secondary | ICD-10-CM | POA: Diagnosis not present

## 2022-03-23 DIAGNOSIS — Z9181 History of falling: Secondary | ICD-10-CM | POA: Diagnosis not present

## 2022-03-23 DIAGNOSIS — Z96651 Presence of right artificial knee joint: Secondary | ICD-10-CM | POA: Diagnosis present

## 2022-03-23 DIAGNOSIS — Z8249 Family history of ischemic heart disease and other diseases of the circulatory system: Secondary | ICD-10-CM | POA: Diagnosis not present

## 2022-03-23 DIAGNOSIS — E872 Acidosis, unspecified: Secondary | ICD-10-CM | POA: Diagnosis present

## 2022-03-23 DIAGNOSIS — M109 Gout, unspecified: Secondary | ICD-10-CM | POA: Diagnosis present

## 2022-03-23 DIAGNOSIS — I248 Other forms of acute ischemic heart disease: Secondary | ICD-10-CM | POA: Diagnosis present

## 2022-03-23 DIAGNOSIS — Z951 Presence of aortocoronary bypass graft: Secondary | ICD-10-CM | POA: Diagnosis not present

## 2022-03-23 DIAGNOSIS — I959 Hypotension, unspecified: Secondary | ICD-10-CM | POA: Diagnosis present

## 2022-03-23 DIAGNOSIS — Z86711 Personal history of pulmonary embolism: Secondary | ICD-10-CM | POA: Diagnosis not present

## 2022-03-23 DIAGNOSIS — I453 Trifascicular block: Secondary | ICD-10-CM | POA: Diagnosis present

## 2022-03-23 DIAGNOSIS — Z5900 Homelessness unspecified: Secondary | ICD-10-CM | POA: Diagnosis not present

## 2022-03-23 DIAGNOSIS — R55 Syncope and collapse: Secondary | ICD-10-CM | POA: Diagnosis present

## 2022-03-23 DIAGNOSIS — I110 Hypertensive heart disease with heart failure: Secondary | ICD-10-CM | POA: Diagnosis present

## 2022-03-23 LAB — BASIC METABOLIC PANEL
Anion gap: 9 (ref 5–15)
BUN: 38 mg/dL — ABNORMAL HIGH (ref 8–23)
CO2: 25 mmol/L (ref 22–32)
Calcium: 8.2 mg/dL — ABNORMAL LOW (ref 8.9–10.3)
Chloride: 102 mmol/L (ref 98–111)
Creatinine, Ser: 1.11 mg/dL (ref 0.61–1.24)
GFR, Estimated: >60 mL/min (ref >60)
Glucose, Bld: 90 mg/dL (ref 70–99)
Potassium: 3.7 mmol/L (ref 3.5–5.1)
Sodium: 136 mmol/L (ref 135–145)

## 2022-03-23 LAB — TROPONIN I (HIGH SENSITIVITY): Troponin I (High Sensitivity): 60 ng/L — ABNORMAL HIGH (ref <18)

## 2022-03-23 LAB — MRSA NEXT GEN BY PCR, NASAL: MRSA by PCR Next Gen: DETECTED — AB

## 2022-03-23 MED ORDER — ASPIRIN EC 81 MG PO TBEC
81.0000 mg | DELAYED_RELEASE_TABLET | Freq: Every day | ORAL | Status: DC
  Administered 2022-03-24 – 2022-03-25 (×2): 81 mg via ORAL
  Filled 2022-03-23 (×2): qty 1

## 2022-03-23 MED ORDER — HYDROXYZINE HCL 10 MG PO TABS
10.0000 mg | ORAL_TABLET | Freq: Three times a day (TID) | ORAL | Status: DC | PRN
  Administered 2022-03-23 – 2022-03-25 (×4): 10 mg via ORAL
  Filled 2022-03-23 (×4): qty 1

## 2022-03-23 MED ORDER — OXYCODONE-ACETAMINOPHEN 5-325 MG PO TABS
1.0000 | ORAL_TABLET | ORAL | Status: DC | PRN
  Administered 2022-03-23: 1 via ORAL
  Administered 2022-03-24: 2 via ORAL
  Administered 2022-03-24: 1 via ORAL
  Administered 2022-03-24: 2 via ORAL
  Administered 2022-03-24: 1 via ORAL
  Administered 2022-03-25: 2 via ORAL
  Filled 2022-03-23 (×2): qty 1
  Filled 2022-03-23 (×3): qty 2
  Filled 2022-03-23: qty 1

## 2022-03-23 NOTE — Evaluation (Signed)
Physical Therapy Evaluation ?Patient Details ?Name: Jeremiah [name] ?MRN: [id number] ?DOB: 17-Jan-1951 ?Today's Date: 03/23/2022 ? ?History of Present Illness ? Jeremiah [name] is a 71 y.o. male with medical history significant for systolic and diastolic CHF, hypertension, alcohol abuse, atrial flutter, pulmonary embolism, CABG x3.  Patient was brought to the ED with reports of hypotension and passing out.  Patient tells me he was in the shower, he felt a little lightheaded, he got out up and was walking to his bedroom, the dizziness worsened and went to sit down on a chair.  And he passed out.  He denies chest pain, no difficulty breathing.  EMS arrived, it was documented that his blood pressure was down to 83/58, ED provider tells me recordings down to 61 systolic was noted on the scene.  Patient denies vomiting, had 1 episode of loose stool in the ED.  She reports stable oral intake.  He is on several medications and not sure what he takes. ?  ?Clinical Impression ? Patient demonstrates good return to function with ability to perform bed mobility and transfers independently and no assistive device. Patient demonstrates improved functional status with ability to perform chair to [facility] transfer independently. Patient is able to ambulate independently with fatigue being the main limiting factor during assessment. Patient discharged to care of nursing for ambulation daily as tolerated for length of stay.   ?   ? ?Recommendations for follow up therapy are one component of a multi-disciplinary discharge planning process, led by the attending physician.  Recommendations may be updated based on patient status, additional functional criteria and insurance authorization. ? ?Follow Up Recommendations No PT follow up ? ?  ?Assistance Recommended at Discharge PRN  ?Patient can return home with the following ? Assistance with cooking/housework ? ?  ?Equipment Recommendations None recommended by PT  ?Recommendations for Other  Services ?    ?  ?Functional Status Assessment Patient has had a recent decline in their functional status and/or demonstrates limited ability to make significant improvements in function in a reasonable and predictable amount of time  ? ?  ?Precautions / Restrictions Precautions ?Precautions: Shoulder ?Shoulder Interventions: Shoulder sling/immobilizer;At all times ?Precaution Booklet Issued: Yes (comment) ?Precaution Comments: L shoulder precautions ?Required Braces or Orthoses: Sling ?Restrictions ?Weight Bearing Restrictions: Yes ?LUE Weight Bearing: Non weight bearing ?Other Position/Activity Restrictions: sling on at all times except for dressing/exercise, AROM of hand/wrist/elbow ok, no shoulder AROM/PROM per MD  ? ?  ? ?Mobility ? Bed Mobility ?Overal bed mobility: Independent ?Bed Mobility: Supine to Sit, Sit to Supine ?Rolling: Independent ?Sidelying to sit: Independent ?Supine to sit: Independent ?Sit to supine: Independent ?  ?General bed mobility comments: Patient is independent with bed mobility with slight increase in time needed to complete task ?  ? ?Transfers ?Overall transfer level: Independent ?Equipment used: None ?Transfers: Sit to/from Stand, Bed to chair/wheelchair/BSC ?Sit to Stand: Independent ?  ?Step pivot transfers: Independent ?  ?  ?  ?General transfer comment: Patient demonstrates appropriate LE strength and balance to perform sit to stand, bed to chair and chair to [facility] transfer indicating good return to function ?  ? ?Ambulation/Gait ?Ambulation/Gait assistance: Independent, Modified independent (Device/Increase time) ?Gait Distance (Feet): 100 Feet ?Assistive device: None ?Gait Pattern/deviations: Step-through pattern, Decreased step length - right, Decreased step length - left, Wide base of support ?Gait velocity: decreased ?  ?  ?General Gait Details: Patient demonstrates close to WNL gait pattern with decreased step length and fatigue being the primary  limiting factors during  assessment ? ?Stairs ?  ?  ?  ?  ?  ? ?Wheelchair Mobility ?  ? ?Modified Rankin (Stroke Patients Only) ?  ? ?  ? ?Balance Overall balance assessment: Independent ?Sitting-balance support: Feet supported ?Sitting balance-Leahy Scale: Normal ?Sitting balance - Comments: Patient able to sit EOB with feet supported without trunk lean or experiencing movement outside BOS ?  ?Standing balance support: No upper extremity supported, During functional activity ?Standing balance-Leahy Scale: Good ?Standing balance comment: Patient able to maintain standing balance with no sway and good control ?  ?  ?  ?  ?  ?  ?  ?  ?  ?  ?  ?   ? ? ? ?Pertinent Vitals/Pain Pain Assessment ?Pain Assessment: 0-10 ?Pain Score: 7  ?Pain Location: left shoulder ?Pain Descriptors / Indicators: Aching ?Pain Intervention(s): Monitored during session, Repositioned, Limited activity within patient's tolerance  ? ? ?Home Living Family/patient expects to be discharged to:: Assisted living ?  ?  ?Type of Home: Assisted living ?  ?  ?  ?  ?  ?Home Equipment: Kasandra Knudsen - single point ?   ?  ?Prior Function Prior Level of Function : Needs assist ?  ?  ?  ?Physical Assist : Mobility (physical) ?Mobility (physical): Bed mobility;Transfers;Gait;Stairs ?  ?Mobility Comments: Patient reports being an independent ambulator w/o AD for household distances ?ADLs Comments: Assisted by ALF staff ?  ? ? ?Hand Dominance  ? Dominant Hand: Right ? ?  ?Extremity/Trunk Assessment  ? Upper Extremity Assessment ?Upper Extremity Assessment: LUE deficits/detail ?LUE Deficits / Details: L UE painful to move out of sling like position along with experiencing pain. ?  ? ?Lower Extremity Assessment ?Lower Extremity Assessment: Overall WFL for tasks assessed ?  ? ?Cervical / Trunk Assessment ?Cervical / Trunk Assessment: Normal  ?Communication  ? Communication: No difficulties  ?Cognition Arousal/Alertness: Awake/alert ?Behavior During Therapy: [facility] for tasks assessed/performed ?Overall  Cognitive Status: Within Functional Limits for tasks assessed ?  ?  ?  ?  ?  ?  ?  ?  ?  ?  ?  ?  ?  ?  ?  ?  ?  ?  ?  ? ?  ?General Comments   ? ?  ?Exercises    ? ?Assessment/Plan  ?  ?PT Assessment Patient does not need any further PT services  ?PT Problem List Decreased strength;Decreased range of motion;Decreased activity tolerance;Decreased balance;Decreased mobility;Decreased coordination;Pain ? ?   ?  ?PT Treatment Interventions     ? ?PT Goals (Current goals can be found in the Care Plan section)  ?Acute Rehab PT Goals ?Patient Stated Goal: return to assisted living ?PT Goal Formulation: With patient ?Time For Goal Achievement: 04/06/22 ?Potential to Achieve Goals: Good ? ?  ?Frequency   ?  ? ? ?Co-evaluation   ?  ?  ?  ?  ? ? ?  ?AM-PAC PT "6 Clicks" Mobility  ?Outcome Measure Help needed turning from your back to your side while in a flat bed without using bedrails?: None ?Help needed moving from lying on your back to sitting on the side of a flat bed without using bedrails?: None ?Help needed moving to and from a bed to a chair (including a wheelchair)?: None ?Help needed standing up from a chair using your arms (e.g., wheelchair or bedside chair)?: None ?Help needed to walk in hospital room?: None ?Help needed climbing 3-5 steps with a railing? : None ?6 Click  Score: 24 ? ?  ?End of Session   ?Activity Tolerance: Patient tolerated treatment well;No increased pain;Patient limited by fatigue ?Patient left: in bed;with call bell/phone within reach ?Nurse Communication: Mobility status ?PT Visit Diagnosis: Unsteadiness on feet (R26.81);Muscle weakness (generalized) (M62.81);Other abnormalities of gait and mobility (R26.89) ?  ? ?Time: 5027-7412 ?PT Time Calculation (min) (ACUTE ONLY): 20 min ? ? ?Charges:   PT Evaluation ?$PT Eval Moderate Complexity: 1 Mod ?PT Treatments ?$Therapeutic Activity: 8-22 mins ?  ?   ? ? ?3:36 PM, 03/23/22 ?Lestine Box, S/PT  ? ?

## 2022-03-23 NOTE — Progress Notes (Signed)
?PROGRESS NOTE ? ? ? ?Jeremiah [name]  HEN:[id number] DOB: [birth year] DOA: 03/22/2022 ?PCP: Pcp, No  ? ?  ?Brief Narrative:  ?Jeremiah [name] is a 71 y.o. male with medical history significant for systolic and diastolic CHF, hypertension, alcohol abuse, atrial flutter, pulmonary embolism, CABG x3. ? ?Patient was brought to the ED with reports of hypotension and passing out.  Patient states that he was in the shower, he felt a little lightheaded, he got out up and was walking to his bedroom, the dizziness worsened and went to sit down on a chair.  And he passed out.  He denies chest pain, no difficulty breathing.  EMS arrived, it was documented that his blood pressure was down to 83/58, ED provider tells me recordings down to 61 systolic was noted on the scene.  Patient was given IV fluids, antihypertensives on hold. ? ?New events last 24 hours / Subjective: ?Patient does not think that she actually passed out, states that after showering, felt dizzy and went to go sit down and closed his eyes.  Denies any chest pains or shortness of breath, heart palpitations.  Denies any nausea, vomiting or diarrhea. ? ?Assessment & Plan: ?  ?Principal Problem: ?  Syncope ?Active Problems: ?  Prosthetic joint infection ([place]) ?  Chronic combined systolic and diastolic heart failure ([place]) ?  Alcohol abuse ?  Essential hypertension ?  Atrial flutter ([place]) ?  AKI (acute kidney injury) ([place]) ?  History of coronary artery bypass graft x 3 ? ? ?Syncope versus presyncope, secondary to hypotension ?-Orthostatic vital signs pending ?-Monitor on telemetry ? ?AKI ?-Resolved ? ?Paroxysmal a flutter ?-Currently on telemetry, normal sinus rhythm ?-Not on beta-blocker therapy due to chronic trifascicular block, history of pauses with beta-blockers in the past ? ?Demand ischemia ?-Troponin trend is flat 19, 52, 91, 60 ?-Cardiology is following ? ?Chronic systolic heart failure ?-Holding diuretic therapy ? ?CAD ?-Aspirin,  Crestor ? ?Hypertension ?-Holding enalapril, Lasix, spironolactone in setting of syncope and hypotension, AKI ? ?Chronic pain ?-Continue MS Contin ? ? ? ? ?DVT prophylaxis:  ?enoxaparin (LOVENOX) injection 40 mg Start: 03/22/22 2145 ? ?Code Status: Full ?Family Communication: None at bedside ?Disposition Plan:  ?Status is: Inpatient ?Remains inpatient appropriate because: Continue to monitor vital signs, orthostatic vital sign is pending, PT evaluation is pending. ? ? ?Antimicrobials:  ?Anti-infectives (From admission, onward)  ? ? Start     Dose/Rate Route Frequency Ordered Stop  ? 03/22/22 1745  cefTRIAXone (ROCEPHIN) 1 g in sodium chloride 0.9 % 100 mL IVPB       ? 1 g ?200 mL/hr over 30 Minutes Intravenous  Once 03/22/22 1730 03/22/22 1853  ? ?  ? ? ? ?Objective: ?Vitals:  ? 03/22/22 2049 03/23/22 0030 03/23/22 0349 03/23/22 0809  ?BP: (!) 102/59 (!) 88/55 (!) 81/58 102/68  ?Pulse: 86 80 68 75  ?Resp: '16 18 18 18  '$ ?Temp: 98.2 ?F (36.8 ?C) 97.6 ?F (36.4 ?C) 97.6 ?F (36.4 ?C) 97.8 ?F (36.6 ?C)  ?TempSrc: Oral Oral    ?SpO2: 99% 100% 100% 100%  ?Weight:      ?Height:      ? ? ?Intake/Output Summary (Last 24 hours) at 03/23/2022 1241 ?Last data filed at 03/23/2022 1100 ?Gross per 24 hour  ?Intake 1390 ml  ?Output 200 ml  ?Net 1190 ml  ? ?Filed Weights  ? 03/22/22 1226  ?Weight: 68 kg  ? ? ?Examination:  ?General exam: Appears calm and comfortable  ?Respiratory system: Clear to  auscultation. Respiratory effort normal. No respiratory distress. No conversational dyspnea.  ?Cardiovascular system: S1 & S2 heard, RRR. No murmurs. No pedal edema. ?Gastrointestinal system: Abdomen is nondistended, soft and nontender. Normal bowel sounds heard. ?Central nervous system: Alert and oriented. No focal neurological deficits. Speech clear.  ?Extremities: Symmetric in appearance  ?Skin: No rashes, lesions or ulcers on exposed skin  ?Psychiatry: Judgement and insight appear normal. Mood & affect appropriate.  ? ?Data Reviewed: I have  personally reviewed following labs and imaging studies ? ?CBC: ?Recent Labs  ?Lab 03/22/22 ?1229  ?WBC 6.5  ?HGB 13.4  ?HCT 45.0  ?MCV 86.9  ?PLT 211  ? ?Basic Metabolic Panel: ?Recent Labs  ?Lab 03/22/22 ?1229 03/22/22 ?2103 03/23/22 ?0451  ?NA 136  --  136  ?K 4.4  --  3.7  ?CL 103  --  102  ?CO2 18*  --  25  ?GLUCOSE 187*  --  90  ?BUN 32*  --  38*  ?CREATININE 1.46*  --  1.11  ?CALCIUM 8.9  --  8.2*  ?MG  --  1.9  --   ? ?GFR: ?Estimated Creatinine Clearance: 58.7 mL/min (by C-G formula based on SCr of 1.11 mg/dL). ?Liver Function Tests: ?No results for input(s): AST, ALT, ALKPHOS, BILITOT, PROT, ALBUMIN in the last 168 hours. ?No results for input(s): LIPASE, AMYLASE in the last 168 hours. ?No results for input(s): AMMONIA in the last 168 hours. ?Coagulation Profile: ?No results for input(s): INR, PROTIME in the last 168 hours. ?Cardiac Enzymes: ?No results for input(s): CKTOTAL, CKMB, CKMBINDEX, TROPONINI in the last 168 hours. ?BNP (last 3 results) ?No results for input(s): PROBNP in the last 8760 hours. ?HbA1C: ?No results for input(s): HGBA1C in the last 72 hours. ?CBG: ?Recent Labs  ?Lab 03/22/22 ?1228  ?GLUCAP 182*  ? ?Lipid Profile: ?No results for input(s): CHOL, HDL, LDLCALC, TRIG, CHOLHDL, LDLDIRECT in the last 72 hours. ?Thyroid Function Tests: ?No results for input(s): TSH, T4TOTAL, FREET4, T3FREE, THYROIDAB in the last 72 hours. ?Anemia Panel: ?No results for input(s): VITAMINB12, FOLATE, FERRITIN, TIBC, IRON, RETICCTPCT in the last 72 hours. ?Sepsis Labs: ?Recent Labs  ?Lab 03/22/22 ?1348 03/22/22 ?1531  ?LATICACIDVEN 3.4* 2.2*  ? ? ?Recent Results (from the past 240 hour(s))  ?Blood culture (routine x 2)     Status: None (Preliminary result)  ? Collection Time: 03/22/22  1:50 PM  ? Specimen: BLOOD RIGHT FOREARM  ?Result Value Ref Range Status  ? Specimen Description BLOOD RIGHT FOREARM  Final  ? Special Requests   Final  ?  BOTTLES DRAWN AEROBIC AND ANAEROBIC Blood Culture results may not be  optimal due to an inadequate volume of blood received in culture bottles ?Performed at [facility], [address], [place], [place] [address] ?  ? Culture PENDING  Incomplete  ? Report Status PENDING  Incomplete  ?Blood culture (routine x 2)     Status: None (Preliminary result)  ? Collection Time: 03/22/22  1:50 PM  ? Specimen: Right Antecubital; Blood  ?Result Value Ref Range Status  ? Specimen Description RIGHT ANTECUBITAL  Final  ? Special Requests   Final  ?  BOTTLES DRAWN AEROBIC AND ANAEROBIC Blood Culture adequate volume ?Performed at [facility], [address], [place],  [address] ?  ? Culture PENDING  Incomplete  ? Report Status PENDING  Incomplete  ?MRSA Next Gen by PCR, Nasal     Status: Abnormal  ? Collection Time: 03/22/22 10:53 PM  ? Specimen: Nasal Mucosa; Nasal  Swab  ?Result Value Ref Range Status  ? MRSA by PCR Next Gen DETECTED (A) NOT DETECTED Final  ?  Comment: RESULT CALLED TO, READ BACK BY AND VERIFIED WITH: ?J MUSE AT 0053 ON 05.03.23 BY ADGER J  ?       ?The GeneXpert MRSA Assay (FDA ?approved for NASAL specimens ?only), is one component of a ?comprehensive MRSA colonization ?surveillance program. It is not ?intended to diagnose MRSA ?infection nor to guide or ?monitor treatment for ?MRSA infections. ?Performed at [facility], [address] [place], [place] [address] ?  ?  ? ? ?Radiology Studies: ?CT HEAD WO CONTRAST (5MM) ? ?Result Date: 03/22/2022 ?CLINICAL DATA:  Unwitnessed fall. EXAM: CT HEAD WITHOUT CONTRAST TECHNIQUE: Contiguous axial images were obtained from the base of the skull through the vertex without intravenous contrast. RADIATION DOSE REDUCTION: This exam was performed according to the departmental dose-optimization program which includes automated exposure control, adjustment of the mA and/or kV according to patient size and/or use of iterative reconstruction technique. COMPARISON:  Mar 29, 2021 FINDINGS: Brain: There is mild cerebral atrophy with widening of the  extra-axial spaces and ventricular dilatation. There are areas of decreased attenuation within the white matter tracts of the supratentorial brain, consistent with microvascular disease changes. Vascular: No hyperdense vessel or unexpecte
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
## 2022-03-23 NOTE — TOC Initial Note (Addendum)
Transition of Care (TOC) - Initial/Assessment Note  ? ? ?Patient Details  ?Name: Jeremiah [name] ?MRN: [id number] ?Date of Birth: August 27, 1951 ? ?Transition of Care (TOC) CM/SW Contact:    ?Iona Beard, LCSWA ?Phone Number: ?03/23/2022, 12:19 PM ? ?Clinical Narrative:                 ?TOC noted per chart review that pt was from [facility] facility. CSW called facility and spoke with staff. CSW informed that pt has been at their facility for about 2 weeks and pt can return at D/C. Pt is independent in completing ADLs at facility. CSW inquired as to if facility could provide transportation at D/C, they are short staffed. CSW spoke to [employer] the owner of the ALF who states that they use [facility] out of [facility] for their residents prescriptions.  ? ?CSW noted per chart review that Loleta Chance listed in chart provided transportation at last D/C. CSW reached out and left VM requesting callback to see if he could provide transportation at time of D/C. TOC to follow.  ? ?Expected Discharge Plan: Assisted Living ?Barriers to Discharge: Continued Medical Work up ? ? ?Patient Goals and CMS Choice ?Patient states their goals for this hospitalization and ongoing recovery are:: Return to ALF ?CMS Medicare.gov Compare Post Acute Care list provided to:: Patient ?Choice offered to / list presented to : Patient ? ?Expected Discharge Plan and Services ?Expected Discharge Plan: Assisted Living ?In-house Referral: Clinical Social Work ?  ?  ?Living arrangements for the past 2 months: [place] ?                ?  ?  ?  ?  ?  ?  ?  ?  ?  ?  ? ?Prior Living Arrangements/Services ?Living arrangements for the past 2 months: [place] ?Lives with:: Facility Resident ?Patient language and need for interpreter reviewed:: Yes ?Do you feel safe going back to the place where you live?: Yes      ?Need for Family Participation in Patient Care: Yes (Comment) ?Care giver support system in  place?: Yes (comment) ?  ?Criminal Activity/Legal Involvement Pertinent to Current Situation/Hospitalization: No - Comment as needed ? ?Activities of Daily Living ?Home Assistive Devices/Equipment: Gilford Rile (specify type), Wheelchair ?ADL Screening (condition at time of admission) ?Patient's cognitive ability adequate to safely complete daily activities?: Yes ?Is the patient deaf or have difficulty hearing?: No ?Does the patient have difficulty seeing, even when wearing glasses/contacts?: No ?Does the patient have difficulty concentrating, remembering, or making decisions?: No ?Patient able to express need for assistance with ADLs?: Yes ?Does the patient have difficulty dressing or bathing?: No ?Independently performs ADLs?: Yes (appropriate for developmental age) ?Does the patient have difficulty walking or climbing stairs?: No ?Weakness of Legs: None ?Weakness of Arms/Hands: None ? ?Permission Sought/Granted ?  ?  ?   ?   ?   ?   ? ?Emotional Assessment ?Appearance:: Appears stated age ?  ?  ?Orientation: : Oriented to Self, Oriented to Place, Oriented to  Time, Oriented to Situation ?Alcohol / Substance Use: Not Applicable ?Psych Involvement: No (comment) ? ?Admission diagnosis:  Syncope and collapse [R55] ?Syncope [R55] ?Patient Active Problem List  ? Diagnosis Date Noted  ? Syncope 03/22/2022  ? Hyperkalemia   ? Prosthetic joint infection ([place]) 02/11/2022  ? Allergic rhinitis due to pollen 01/30/2022  ? Amnesia 01/30/2022  ? Arthropathy 01/30/2022  ? Body mass index (BMI)  34.0-34.9, adult 01/30/2022  ? Erectile dysfunction due to arterial insufficiency 01/30/2022  ? Gout 01/30/2022  ? Hypertensive heart disease without congestive heart failure 01/30/2022  ? Insomnia 01/30/2022  ? Iron deficiency anemia 01/30/2022  ? Major depressive disorder, single episode, in full remission ([place]) 01/30/2022  ? Recurrent falls 01/30/2022  ? Spinal stenosis of lumbosacral region 01/30/2022  ? 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  Vitamin D deficiency 01/30/2022  ? Wrist pain 01/28/2022  ? Back pain 01/26/2022  ? Hypokalemia 01/25/2022  ? Protein-calorie malnutrition, severe 01/20/2022  ? CAD (coronary artery disease) 01/18/2022  ? Thoracic aortic aneurysm (TAA) ([place]) 01/17/2022  ? Chronic combined systolic and diastolic heart failure ([place]) 01/16/2022  ? Atrial flutter ([place]) 01/16/2022  ? Traumatic hematoma of left shoulder 01/16/2022  ? Iron deficiency anemia due to chronic blood loss 01/16/2022  ? Alcohol abuse 01/16/2022  ? Lower leg edema 01/16/2022  ? B12 deficiency 01/16/2022  ? Hypotension 01/16/2022  ? Shoulder wound, left, initial encounter 01/16/2022  ? Depression   ? Chest pain 01/15/2022  ? Alcohol withdrawal syndrome with complication ([place]) 77/41/[id number]  ? Homeless 11/22/2021  ? Opioid dependence ([place]) 11/22/2021  ? Generalized anxiety disorder 09/27/2021  ? Severe recurrent major 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 ([place]) 06/06/2021  ? Pressure injury of both heels, stage 1 05/18/2021  ? Substance use disorder 05/17/2021  ? History of coronary artery disease 05/17/2021  ? Pulmonary embolism ([place]) 04/23/2021  ? Degenerative disc disease, lumbar 04/23/2021  ? History of coronary artery bypass graft x 3 04/23/2021  ? History of gastric bypass 04/23/2021  ? Chronic pain disorder 04/23/2021  ? Primary osteoarthritis involving multiple joints 04/23/2021  ? History of pulmonary embolism 03/29/2021  ? Polypharmacy 03/29/2021  ? Major neurocognitive disorder ([place]) 02/11/2021  ? Hypoalbuminemia 01/31/2021  ? AKI (acute kidney injury) ([place]) 10/13/2020  ? GERD (gastroesophageal reflux disease) 10/13/2020  ? Shoulder arthritis 02/27/2018  ? Morbid obesity ([place]) 07/24/2013  ? Expected blood loss anemia 07/24/2013  ? S/P right TKA 07/23/2013  ? AAA (abdominal aortic aneurysm) without rupture ([place]) 03/18/2011  ? EDEMA 03/09/2009  ? HYPERCHOLESTEROLEMIA 03/06/2009  ? Essential hypertension 03/06/2009  ? Coronary atherosclerosis 03/06/2009  ?  BUNDLE BRANCH BLOCK, RIGHT 03/06/2009  ? BRADYCARDIA 03/06/2009  ? ?PCP:  Pcp, No ?Pharmacy:   ?[facility] ?[address] ?[place] [address] ?Phone: [phone] Fax: [phone] ? ? ? ? ?Social Determinants of Health (SDOH) Interventions ?  ? ?Readmission Risk Interventions ? ?  03/23/2022  ? 12:11 PM 03/02/2022  ?  1:30 PM 02/21/2022  ? 11:53 AM  ?Readmission Risk Prevention Plan  ?Transportation Screening Complete Complete Complete  ?Medication Review Press photographer) Complete Complete Complete  ?PCP or Specialist appointment within 3-5 days of discharge  Complete Complete  ?[place] or Home Care Consult Complete Complete Complete  ?SW Recovery Care/Counseling Consult Complete Complete Complete  ?Palliative Care Screening Not Applicable  Not Applicable  ?[place] Not Applicable Not Applicable Not Applicable  ? ? ? ?

## 2022-03-24 MED ORDER — SPIRONOLACTONE 12.5 MG HALF TABLET
12.5000 mg | ORAL_TABLET | Freq: Every day | ORAL | Status: DC
  Administered 2022-03-24 – 2022-03-25 (×2): 12.5 mg via ORAL
  Filled 2022-03-24 (×5): qty 1

## 2022-03-24 MED ORDER — LISINOPRIL 5 MG PO TABS
2.5000 mg | ORAL_TABLET | Freq: Every day | ORAL | Status: DC
  Administered 2022-03-24 – 2022-03-25 (×2): 2.5 mg via ORAL
  Filled 2022-03-24 (×2): qty 1

## 2022-03-24 NOTE — Progress Notes (Signed)
?PROGRESS NOTE ? ? ? ?Jeremiah [name]  TFT:[id number] DOB: 1951-04-15 DOA: 03/22/2022 ?PCP: Pcp, No  ? ?  ?Brief Narrative:  ?Jeremiah [name] is a 71 y.o. male with medical history significant for systolic and diastolic CHF, hypertension, alcohol abuse, atrial flutter, pulmonary embolism, CABG x3. ? ?Patient was brought to the ED with reports of hypotension and passing out.  Patient states that he was in the shower, he felt a little lightheaded, he got out up and was walking to his bedroom, the dizziness worsened and went to sit down on a chair.  And he passed out.  He denies chest pain, no difficulty breathing.  EMS arrived, it was documented that his blood pressure was down to 83/58, ED provider tells me recordings down to 61 systolic was noted on the scene.  Patient was given IV fluids, antihypertensives on hold. ? ?New events last 24 hours / Subjective: ?His main complaint is occipital headache.  He has received some pain medication.  Admits to having some dizziness when getting out of bed yesterday, has not been out of bed yet today. ? ?Assessment & Plan: ?  ?Principal Problem: ?  Syncope ?Active Problems: ?  Prosthetic joint infection ([place]) ?  Chronic combined systolic and diastolic heart failure ([place]) ?  Alcohol abuse ?  Essential hypertension ?  Atrial flutter ([place]) ?  AKI (acute kidney injury) ([place]) ?  History of coronary artery bypass graft x 3 ? ? ?Syncope versus presyncope, secondary to hypotension ?-Orthostatic vital signs negative yesterday ?-Monitor on telemetry, and sinus bradycardia rate 58 ? ?AKI ?-Resolved ? ?Paroxysmal a flutter ?-Currently on telemetry, normal sinus rhythm ?-Not on beta-blocker therapy due to chronic trifascicular block, history of pauses with beta-blockers in the past ? ?Demand ischemia ?-Troponin trend is flat 19, 52, 91, 60 ?-Cardiology is following ? ?Chronic systolic heart failure ?-Holding diuretic therapy ? ?CAD ?-Aspirin, Crestor ? ?Hypertension ?-Holding enalapril, Lasix,  spironolactone in setting of syncope and hypotension, AKI ? ?Chronic pain ?-Continue MS Contin ?-Oxycodone as needed ? ? ? ? ?DVT prophylaxis:  ?enoxaparin (LOVENOX) injection 40 mg Start: 03/22/22 2145 ? ?Code Status: Full ?Family Communication: None at bedside ?Disposition Plan:  ?Status is: Inpatient ?Remains inpatient appropriate because: Need to adjust his home antihypertensive, diuretic medication regimen ? ?Antimicrobials:  ?Anti-infectives (From admission, onward)  ? ? Start     Dose/Rate Route Frequency Ordered Stop  ? 03/22/22 1745  cefTRIAXone (ROCEPHIN) 1 g in sodium chloride 0.9 % 100 mL IVPB       ? 1 g ?200 mL/hr over 30 Minutes Intravenous  Once 03/22/22 1730 03/22/22 1853  ? ?  ? ? ? ?Objective: ?Vitals:  ? 03/23/22 0809 03/23/22 1309 03/23/22 2025 03/24/22 0400  ?BP: 102/68  102/66 110/74  ?Pulse: 75  68 70  ?Resp: '18 18 18 18  '$ ?Temp: 97.8 ?F (36.6 ?C) 98 ?F (36.7 ?C) 97.9 ?F (36.6 ?C) 98.7 ?F (37.1 ?C)  ?TempSrc:   Oral   ?SpO2: 100%  99% 100%  ?Weight:      ?Height:      ? ? ?Intake/Output Summary (Last 24 hours) at 03/24/2022 1113 ?Last data filed at 03/24/2022 [phone] ?Gross per 24 hour  ?Intake 480 ml  ?Output --  ?Net 480 ml  ? ? ?Filed Weights  ? 03/22/22 1226  ?Weight: 68 kg  ? ? ?Examination:  ?General exam: Appears calm  ?Respiratory system: Clear to auscultation. Respiratory effort normal. No respiratory distress. No conversational dyspnea.  ?Cardiovascular  system: S1 & S2 heard, bradycardic, regular rhythm, rate 58 ?Gastrointestinal system: Abdomen is nondistended, soft and nontender. Normal bowel sounds heard. ?Central nervous system: Alert and oriented. No focal neurological deficits. Speech clear.  ?Extremities: Symmetric in appearance  ?Skin: No rashes, lesions or ulcers on exposed skin  ?Psychiatry: Judgement and insight appear normal. Mood & affect appropriate.  ? ?Data Reviewed: I have personally reviewed following labs and imaging studies ? ?CBC: ?Recent Labs  ?Lab 03/22/22 ?1229  ?WBC  6.5  ?HGB 13.4  ?HCT 45.0  ?MCV 86.9  ?PLT 211  ? ? ?Basic Metabolic Panel: ?Recent Labs  ?Lab 03/22/22 ?1229 03/22/22 ?2103 03/23/22 ?0451  ?NA 136  --  136  ?K 4.4  --  3.7  ?CL 103  --  102  ?CO2 18*  --  25  ?GLUCOSE 187*  --  90  ?BUN 32*  --  38*  ?CREATININE 1.46*  --  1.11  ?CALCIUM 8.9  --  8.2*  ?MG  --  1.9  --   ? ? ?GFR: ?Estimated Creatinine Clearance: 58.7 mL/min (by C-G formula based on SCr of 1.11 mg/dL). ?Liver Function Tests: ?No results for input(s): AST, ALT, ALKPHOS, BILITOT, PROT, ALBUMIN in the last 168 hours. ?No results for input(s): LIPASE, AMYLASE in the last 168 hours. ?No results for input(s): AMMONIA in the last 168 hours. ?Coagulation Profile: ?No results for input(s): INR, PROTIME in the last 168 hours. ?Cardiac Enzymes: ?No results for input(s): CKTOTAL, CKMB, CKMBINDEX, TROPONINI in the last 168 hours. ?BNP (last 3 results) ?No results for input(s): PROBNP in the last 8760 hours. ?HbA1C: ?No results for input(s): HGBA1C in the last 72 hours. ?CBG: ?Recent Labs  ?Lab 03/22/22 ?1228  ?GLUCAP 182*  ? ? ?Lipid Profile: ?No results for input(s): CHOL, HDL, LDLCALC, TRIG, CHOLHDL, LDLDIRECT in the last 72 hours. ?Thyroid Function Tests: ?No results for input(s): TSH, T4TOTAL, FREET4, T3FREE, THYROIDAB in the last 72 hours. ?Anemia Panel: ?No results for input(s): VITAMINB12, FOLATE, FERRITIN, TIBC, IRON, RETICCTPCT in the last 72 hours. ?Sepsis Labs: ?Recent Labs  ?Lab 03/22/22 ?1348 03/22/22 ?1531  ?LATICACIDVEN 3.4* 2.2*  ? ? ? ?Recent Results (from the past 240 hour(s))  ?Blood culture (routine x 2)     Status: None (Preliminary result)  ? Collection Time: 03/22/22  1:50 PM  ? Specimen: BLOOD RIGHT FOREARM  ?Result Value Ref Range Status  ? Specimen Description BLOOD RIGHT FOREARM  Final  ? Special Requests   Final  ?  BOTTLES DRAWN AEROBIC AND ANAEROBIC Blood Culture results may not be optimal due to an inadequate volume of blood received in culture bottles  ? Culture   Final  ?  NO  GROWTH 2 DAYS ?Performed at [facility], [address], [place], [place] [address] ?  ? Report Status PENDING  Incomplete  ?Blood culture (routine x 2)     Status: None (Preliminary result)  ? Collection Time: 03/22/22  1:50 PM  ? Specimen: Right Antecubital; Blood  ?Result Value Ref Range Status  ? Specimen Description RIGHT ANTECUBITAL  Final  ? Special Requests   Final  ?  BOTTLES DRAWN AEROBIC AND ANAEROBIC Blood Culture adequate volume  ? Culture   Final  ?  NO GROWTH 2 DAYS ?Performed at [facility], [address], [place], [place] [address] ?  ? Report Status PENDING  Incomplete  ?MRSA Next Gen by PCR, Nasal     Status: Abnormal  ? Collection Time: 03/22/22 10:53 PM  ?  Specimen: Nasal Mucosa; Nasal Swab  ?Result Value Ref Range Status  ? MRSA by PCR Next Gen DETECTED (A) NOT DETECTED Final  ?  Comment: RESULT CALLED TO, READ BACK BY AND VERIFIED WITH: ?J MUSE AT 0053 ON 05.03.23 BY ADGER J  ?       ?The GeneXpert MRSA Assay (FDA ?approved for NASAL specimens ?only), is one component of a ?comprehensive MRSA colonization ?surveillance program. It is not ?intended to diagnose MRSA ?infection nor to guide or ?monitor treatment for ?MRSA infections. ?Performed at [facility], [address] [place], [place] [address] ?  ? ?  ? ? ?Radiology Studies: ?CT HEAD WO CONTRAST (5MM) ? ?Result Date: 03/22/2022 ?CLINICAL DATA:  Unwitnessed fall. EXAM: CT HEAD WITHOUT CONTRAST TECHNIQUE: Contiguous axial images were obtained from the base of the skull through the vertex without intravenous contrast. RADIATION DOSE REDUCTION: This exam was performed according to the departmental dose-optimization program which includes automated exposure control, adjustment of the mA and/or kV according to patient size and/or use of iterative reconstruction technique. COMPARISON:  Mar 29, 2021 FINDINGS: Brain: There is mild cerebral atrophy with widening of the extra-axial spaces and ventricular dilatation. There are areas of decreased  attenuation within the white matter tracts of the supratentorial brain, consistent with microvascular disease changes. Vascular: No hyperdense vessel or unexpected calcification. Skull: Normal. Negative f

## 2022-03-24 NOTE — Progress Notes (Addendum)
? ?Progress Note ? ?Patient Name: ASAR EVILSIZER ?Date of Encounter: 03/24/2022 ? ?[facility] Cardiologist: Jenkins Rouge, MD  ? ?Subjective  ? ?Dizziness improved. No recurrent presyncope. Says he has a headache this morning and just received Morphine. Covering his head with a pillow. Says he was on Oxycodone as an outpatient and is requesting this dose be adjusted.  ? ?Inpatient Medications  ?  ?Scheduled Meds: ? allopurinol  100 mg Oral Daily  ? aspirin EC  81 mg Oral Daily  ? docusate sodium  100 mg Oral BID  ? enoxaparin (LOVENOX) injection  40 mg Subcutaneous Q24H  ? gabapentin  300 mg Oral TID  ? morphine  15 mg Oral Q12H  ? pantoprazole  40 mg Oral BID  ? rosuvastatin  40 mg Oral Daily  ? ?Continuous Infusions: ? ?PRN Meds: ?acetaminophen **OR** acetaminophen, hydrOXYzine, oxyCODONE-acetaminophen, polyethylene glycol, zolpidem  ? ?Vital Signs  ?  ?Vitals:  ? 03/23/22 0809 03/23/22 1309 03/23/22 2025 03/24/22 0400  ?BP: 102/68  102/66 110/74  ?Pulse: 75  68 70  ?Resp: '18 18 18 18  '$ ?Temp: 97.8 ?F (36.6 ?C) 98 ?F (36.7 ?C) 97.9 ?F (36.6 ?C) 98.7 ?F (37.1 ?C)  ?TempSrc:   Oral   ?SpO2: 100%  99% 100%  ?Weight:      ?Height:      ? ? ?Intake/Output Summary (Last 24 hours) at 03/24/2022 0930 ?Last data filed at 03/23/2022 1819 ?Gross per 24 hour  ?Intake 480 ml  ?Output 200 ml  ?Net 280 ml  ? ? ?  03/22/2022  ? 12:26 PM 03/08/2022  ?  5:00 AM 03/07/2022  ?  5:00 AM  ?Last 3 Weights  ?Weight (lbs) 150 lb 179 lb 10.8 oz 179 lb 14.3 oz  ?Weight (kg) 68.04 kg 81.5 kg 81.6 kg  ?   ? ?Telemetry  ?  ?Sinus bradycardia, HR in 50's to 60's with 1st degree AV block. No high-grade block.  - Personally Reviewed ? ?ECG  ?  ?No new tracings.  ? ?Physical Exam  ? ?GEN: Appearing in no acute distress.   ?Neck: No JVD ?Cardiac: RRR, no murmurs, rubs, or gallops.  ?Respiratory: Clear to auscultation bilaterally. ?GI: Soft, nontender, non-distended  ?MS: Trace lower extremity edema; No deformity. ?Neuro:  Nonfocal  ?Psych: Normal affect   ? ?Labs  ?  ?High Sensitivity Troponin:   ?Recent Labs  ?Lab 03/22/22 ?1229 03/22/22 ?1531 03/22/22 ?2103 03/23/22 ?0451  ?TROPONINIHS 19* 52* 91* 60*  ?   ?Chemistry ?Recent Labs  ?Lab 03/22/22 ?1229 03/22/22 ?2103 03/23/22 ?0451  ?NA 136  --  136  ?K 4.4  --  3.7  ?CL 103  --  102  ?CO2 18*  --  25  ?GLUCOSE 187*  --  90  ?BUN 32*  --  38*  ?CREATININE 1.46*  --  1.11  ?CALCIUM 8.9  --  8.2*  ?MG  --  1.9  --   ?GFRNONAA 52*  --  >60  ?ANIONGAP 15  --  9  ?  ?Lipids No results for input(s): CHOL, TRIG, HDL, LABVLDL, LDLCALC, CHOLHDL in the last 168 hours.  ?Hematology ?Recent Labs  ?Lab 03/22/22 ?1229  ?WBC 6.5  ?RBC 5.18  ?HGB 13.4  ?HCT 45.0  ?MCV 86.9  ?MCH 25.9*  ?MCHC 29.8*  ?RDW 19.2*  ?PLT 211  ? ?Thyroid No results for input(s): TSH, FREET4 in the last 168 hours.  ?BNPNo results for input(s): BNP, PROBNP in the last 168  hours.  ?DDimer No results for input(s): DDIMER in the last 168 hours.  ? ?Radiology  ?  ?CT HEAD WO CONTRAST (5MM) ? ?Result Date: 03/22/2022 ?CLINICAL DATA:  Unwitnessed fall. EXAM: CT HEAD WITHOUT CONTRAST TECHNIQUE: Contiguous axial images were obtained from the base of the skull through the vertex without intravenous contrast. RADIATION DOSE REDUCTION: This exam was performed according to the departmental dose-optimization program which includes automated exposure control, adjustment of the mA and/or kV according to patient size and/or use of iterative reconstruction technique. COMPARISON:  Mar 29, 2021 FINDINGS: Brain: There is mild cerebral atrophy with widening of the extra-axial spaces and ventricular dilatation. There are areas of decreased attenuation within the white matter tracts of the supratentorial brain, consistent with microvascular disease changes. Vascular: No hyperdense vessel or unexpected calcification. Skull: Normal. Negative for fracture or focal lesion. Sinuses/Orbits: No acute finding. Other: None. IMPRESSION: No acute intracranial abnormality. Electronically Signed    By: Virgina Norfolk M.D.   On: 03/22/2022 23:18  ? ?CT Angio Chest PE W and/or Wo Contrast ? ?Result Date: 03/22/2022 ?CLINICAL DATA:  Syncope, hypotension, prior history of PE EXAM: CT ANGIOGRAPHY CHEST WITH CONTRAST TECHNIQUE: Multidetector CT imaging of the chest was performed using the standard protocol during bolus administration of intravenous contrast. Multiplanar CT image reconstructions and MIPs were obtained to evaluate the vascular anatomy. RADIATION DOSE REDUCTION: This exam was performed according to the departmental dose-optimization program which includes automated exposure control, adjustment of the mA and/or kV according to patient size and/or use of iterative reconstruction technique. CONTRAST:  25m OMNIPAQUE IOHEXOL 350 MG/ML SOLN COMPARISON:  01/16/2022 FINDINGS: Cardiovascular: There is homogeneous enhancement in the thoracic aorta. There is ectasia of ascending thoracic aorta measuring 4.6 cm. There is ectasia of main pulmonary artery measuring 3.5 cm. There are no intraluminal filling defects in the pulmonary artery branches. Coronary artery calcifications are seen. There is evidence of previous coronary bypass surgery. Heart is enlarged in size. Mediastinum/Nodes: Small hiatal hernia is seen. Thyroid is smaller than usual in size. Lungs/Pleura: Increased interstitial markings are seen in the posterior lower lung fields. There is no pleural effusion or pneumothorax. Upper Abdomen: Surgical staples are seen in the stomach. Musculoskeletal: There is anterior dislocation in the prosthetic left shoulder. Severe degenerative changes are noted in the right shoulder. Effusions are present in both shoulders, more so on the left side. Review of the MIP images confirms the above findings. IMPRESSION: There is no evidence of pulmonary artery embolism. There is ectasia of main pulmonary artery suggesting pulmonary arterial hypertension. 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 Coronary artery calcifications are seen. Cardiomegaly. Increased interstitial markings in the posterior mid and lower lung fields may suggest scarring or interstitial pneumonia. There is dislocation in the prosthetic left shoulder. Degenerative changes are noted in the right shoulder. Effusions are present in both shoulders, more so on the left side. Small hiatal hernia. Electronically Signed   By: PElmer PickerM.D.   On: 03/22/2022 15:11  ? ?DG Chest Port 1 View ? ?Result Date: 03/22/2022 ?CLINICAL DATA:  Syncope and hypotension. EXAM: PORTABLE CHEST 1 VIEW COMPARISON:  Chest x-ray dated January 27, 2022. FINDINGS: Interval removal of the right upper extremity PICC line. Stable cardiomediastinal silhouette status post CABG. Normal pulmonary vascularity. No focal consolidation, pleural effusion, or pneumothorax. No acute osseous abnormality. IMPRESSION: 1. No active disease. Electronically Signed   By: WTitus DubinM.D.   On: 03/22/2022 14:01   ? ?  Cardiac Studies  ? ?Echocardiogram: 01/16/2022 ?IMPRESSIONS  ? ? ? 1. Left ventricular ejection fraction, by estimation, is 35 to 40%. The  ?left ventricle has moderately decreased function. The left ventricle  ?demonstrates global hypokinesis. The left ventricular internal cavity size  ?was mildly dilated. There is mild  ?left ventricular hypertrophy. Left ventricular diastolic parameters are  ?indeterminate.  ? 2. Right ventricular systolic function is normal. The right ventricular  ?size is normal. There is normal pulmonary artery systolic pressure.  ? 3. Left atrial size was moderately dilated.  ? 4. The mitral valve is normal in structure. Mild mitral valve  ?regurgitation. No evidence of mitral stenosis.  ? 5. Tricuspid valve regurgitation is mild to moderate.  ? 6. The aortic valve is tricuspid. Aortic valve regurgitation is mild.  ?Aortic valve sclerosis is present, with no evidence of aortic valve  ?stenosis.  ? 7. Aortic dilatation noted. There is  mild dilatation of the ascending  ?aorta, measuring 43 mm.  ? 8. The inferior vena cava is dilated in size with >50% respiratory  ?variability, suggesting right atrial pressure of 8 mmHg.  ? ?Patient

## 2022-03-25 LAB — BASIC METABOLIC PANEL
Anion gap: 6 (ref 5–15)
BUN: 15 mg/dL (ref 8–23)
CO2: 26 mmol/L (ref 22–32)
Calcium: 8.8 mg/dL — ABNORMAL LOW (ref 8.9–10.3)
Chloride: 107 mmol/L (ref 98–111)
Creatinine, Ser: 0.8 mg/dL (ref 0.61–1.24)
GFR, Estimated: >60 mL/min (ref >60)
Glucose, Bld: 85 mg/dL (ref 70–99)
Potassium: 3.9 mmol/L (ref 3.5–5.1)
Sodium: 139 mmol/L (ref 135–145)

## 2022-03-25 MED ORDER — ENALAPRIL MALEATE 2.5 MG PO TABS: 2.5000 mg | ORAL_TABLET | Freq: Every day | ORAL | 0 refills | Status: AC

## 2022-03-25 MED ORDER — FUROSEMIDE 20 MG PO TABS: 20.0000 mg | ORAL_TABLET | Freq: Every day | ORAL | 1 refills | Status: DC | PRN

## 2022-03-25 MED ORDER — SPIRONOLACTONE 25 MG PO TABS
12.5000 mg | ORAL_TABLET | Freq: Every day | ORAL | 0 refills | Status: AC
Start: 2022-03-25 — End: ?

## 2022-03-25 NOTE — [place] FL2 (Signed)
?[place] MEDICAID FL2 LEVEL OF CARE SCREENING TOOL  ?  ? ?IDENTIFICATION  ?Patient Name: ?Jeremiah [name] Birthdate: 1951-09-17 Sex: male Admission Date (Current Location): ?03/22/2022  ?[place] and [place] Number: ? [place] and Address:  ?[facility] [address], [place] ?     Provider Number: ?[id number]  ?Attending Physician Name and Address:  ?Dessa Phi, DO ? Relative Name and Phone Number:  ?  ?   ?Current Level of Care: ?Hospital Recommended Level of Care: ?Assisted Living Facility Prior Approval Number: ?  ? ?Date Approved/Denied: ?  PASRR Number: ?  ? ?Discharge Plan: ?Other (Comment) (ALF) ?  ? ?Current Diagnoses: ?Patient Active Problem List  ? Diagnosis Date Noted  ? Syncope 03/22/2022  ? Hyperkalemia   ? Prosthetic joint infection ([place]) 02/11/2022  ? Allergic rhinitis due to pollen 01/30/2022  ? Amnesia 01/30/2022  ? Arthropathy 01/30/2022  ? Body mass index (BMI) 34.0-34.9, adult 01/30/2022  ? Erectile dysfunction due to arterial insufficiency 01/30/2022  ? Gout 01/30/2022  ? Hypertensive heart disease without congestive heart failure 01/30/2022  ? Insomnia 01/30/2022  ? Iron deficiency anemia 01/30/2022  ? Major depressive disorder, single episode, in full remission ([place]) 01/30/2022  ? Recurrent falls 01/30/2022  ? Spinal stenosis of lumbosacral region 01/30/2022  ? 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 Vitamin D deficiency 01/30/2022  ? Wrist pain 01/28/2022  ? Back pain 01/26/2022  ? Hypokalemia 01/25/2022  ? Protein-calorie malnutrition, severe 01/20/2022  ? CAD (coronary artery disease) 01/18/2022  ? Thoracic aortic aneurysm (TAA) ([place]) 01/17/2022  ? Chronic combined systolic and diastolic heart failure ([place]) 01/16/2022  ? Atrial flutter ([place]) 01/16/2022  ? Traumatic hematoma of left shoulder 01/16/2022  ? Iron deficiency anemia due to chronic blood loss 01/16/2022  ? Alcohol abuse 01/16/2022  ? Lower leg edema 01/16/2022  ? B12 deficiency 01/16/2022  ?  Hypotension 01/16/2022  ? Shoulder wound, left, initial encounter 01/16/2022  ? Depression   ? Chest pain 01/15/2022  ? Alcohol withdrawal syndrome with complication ([place]) 05/14/7627  ? Homeless 11/22/2021  ? Opioid dependence ([place]) 11/22/2021  ? Generalized anxiety disorder 09/27/2021  ? Severe recurrent major 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 ([place]) 06/06/2021  ? Pressure injury of both heels, stage 1 05/18/2021  ? Substance use disorder 05/17/2021  ? History of coronary artery disease 05/17/2021  ? Pulmonary embolism ([place]) 04/23/2021  ? Degenerative disc disease, lumbar 04/23/2021  ? History of coronary artery bypass graft x 3 04/23/2021  ? History of gastric bypass 04/23/2021  ? Chronic pain disorder 04/23/2021  ? Primary osteoarthritis involving multiple joints 04/23/2021  ? History of pulmonary embolism 03/29/2021  ? Polypharmacy 03/29/2021  ? Major neurocognitive disorder ([place]) 02/11/2021  ? Hypoalbuminemia 01/31/2021  ? AKI (acute kidney injury) ([place]) 10/13/2020  ? GERD (gastroesophageal reflux disease) 10/13/2020  ? Shoulder arthritis 02/27/2018  ? Morbid obesity ([place]) 07/24/2013  ? Expected blood loss anemia 07/24/2013  ? S/P right TKA 07/23/2013  ? AAA (abdominal aortic aneurysm) without rupture ([place]) 03/18/2011  ? EDEMA 03/09/2009  ? HYPERCHOLESTEROLEMIA 03/06/2009  ? Essential hypertension 03/06/2009  ? Coronary atherosclerosis 03/06/2009  ? BUNDLE BRANCH BLOCK, RIGHT 03/06/2009  ? BRADYCARDIA 03/06/2009  ? ? ?Orientation RESPIRATION BLADDER Height & Weight   ?  ?Self, Time, Situation, Place ? Normal Continent Weight: 150 lb (68 kg) ?Height:  '5\' 7"'$  (170.2 cm)  ?BEHAVIORAL SYMPTOMS/MOOD NEUROLOGICAL BOWEL NUTRITION STATUS  ?    Continent Diet (Regular)  ?AMBULATORY STATUS  COMMUNICATION OF NEEDS Skin   ?Independent Verbally Normal ?  ?  ?  ?    ?     ?     ? ? ?Personal Care Assistance Level of Assistance  ?  Bathing Assistance: Independent ?Feeding assistance: Independent ?Dressing Assistance:  Independent ?   ? ?Functional Limitations Info  ?  Sight Info: Adequate ?Hearing Info: Adequate ?Speech Info: Adequate  ? ? ?SPECIAL CARE FACTORS FREQUENCY  ?    ?  ?  ?  ?  ?  ?  ?   ? ? ?Contractures Contractures Info: Not present  ? ? ?Additional Factors Info  ?  Code Status Info: Full ?Allergies Info: Morphine ?Psychotropic Info: Wellbutrin, Klonapin, Buspar, Seroquel ?  ?  ?   ? ?Current Medications (03/25/2022):  This is the current hospital active medication list ?Current Facility-Administered Medications  ?Medication Dose Route Frequency Provider Last Rate Last Admin  ? acetaminophen (TYLENOL) tablet 650 mg  650 mg Oral Q6H PRN Emokpae, Ejiroghene E, MD      ? Or  ? acetaminophen (TYLENOL) suppository 650 mg  650 mg Rectal Q6H PRN Emokpae, Ejiroghene E, MD      ? allopurinol (ZYLOPRIM) tablet 100 mg  100 mg Oral Daily Emokpae, Ejiroghene E, MD   100 mg at 03/25/22 0838  ? aspirin EC tablet 81 mg  81 mg Oral Daily Erma Heritage, PA-C   81 mg at 03/25/22 7741  ? docusate sodium (COLACE) capsule 100 mg  100 mg Oral BID Emokpae, Ejiroghene E, MD   100 mg at 03/25/22 0839  ? enoxaparin (LOVENOX) injection 40 mg  40 mg Subcutaneous Q24H Emokpae, Ejiroghene E, MD   40 mg at 03/24/22 2200  ? gabapentin (NEURONTIN) capsule 300 mg  300 mg Oral TID Emokpae, Ejiroghene E, MD   300 mg at 03/25/22 2878  ? hydrOXYzine (ATARAX) tablet 10 mg  10 mg Oral TID PRN Dessa Phi, DO   10 mg at 03/25/22 0422  ? lisinopril (ZESTRIL) tablet 2.5 mg  2.5 mg Oral Daily Arnoldo Lenis, MD   2.5 mg at 03/25/22 6767  ? morphine (MS CONTIN) 12 hr tablet 15 mg  15 mg Oral Q12H Emokpae, Ejiroghene E, MD   15 mg at 03/25/22 2094  ? oxyCODONE-acetaminophen (PERCOCET/ROXICET) 5-325 MG per tablet 1-2 tablet  1-2 tablet Oral Q4H PRN Dessa Phi, DO   2 tablet at 03/25/22 0422  ? pantoprazole (PROTONIX) EC tablet 40 mg  40 mg Oral BID Emokpae, Ejiroghene E, MD   40 mg at 03/25/22 0839  ? polyethylene glycol (MIRALAX / GLYCOLAX) packet  17 g  17 g Oral Daily PRN Emokpae, Ejiroghene E, MD      ? rosuvastatin (CRESTOR) tablet 40 mg  40 mg Oral Daily Emokpae, Ejiroghene E, MD   40 mg at 03/25/22 7096  ? spironolactone (ALDACTONE) tablet 12.5 mg  12.5 mg Oral Daily Arnoldo Lenis, MD   12.5 mg at 03/25/22 2836  ? zolpidem (AMBIEN) tablet 5 mg  5 mg Oral QHS PRN Emokpae, Ejiroghene E, MD   5 mg at 03/24/22 2201  ? ? ? ?Discharge Medications: ? ?STOP taking these medications   ?  ?Ensure Max Protein Liqd ?   ?lidocaine 5 % ?Commonly known as: LIDODERM ?   ?melatonin 5 MG Tabs ?   ?QUEtiapine 50 MG tablet ?Commonly known as: SEROQUEL ?   ?tiZANidine 2 MG tablet ?Commonly known as: ZANAFLEX ?   ?  ?   ?  ?  TAKE these medications   ?  ?acetaminophen 325 MG tablet ?Commonly known as: TYLENOL ?Take 2 tablets (650 mg total) by mouth every 6 (six) hours as needed for mild pain, fever or headache. ?   ?allopurinol 100 MG tablet ?Commonly known as: ZYLOPRIM ?Take 1 tablet (100 mg total) by mouth daily. ?   ?aspirin 81 MG EC tablet ?Take 1 tablet (81 mg total) by mouth daily. Swallow whole. ?   ?buPROPion 300 MG 24 hr tablet ?Commonly known as: WELLBUTRIN XL ?Take 1 tablet (300 mg total) by mouth every morning. ?   ?busPIRone 15 MG tablet ?Commonly known as: BUSPAR ?Take 1 tablet (15 mg total) by mouth 3 (three) times daily. ?   ?cyanocobalamin 1000 MCG tablet ?Take 1 tablet (1,000 mcg total) by mouth daily. ?   ?docusate sodium 100 MG capsule ?Commonly known as: COLACE ?Take 1 capsule (100 mg total) by mouth 2 (two) times daily. ?   ?enalapril 2.5 MG tablet ?Commonly known as: VASOTEC ?Take 1 tablet (2.5 mg total) by mouth daily. ?What changed:  ?medication strength ?how much to take ?   ?ferrous sulfate 325 (65 FE) MG tablet ?Take 1 tablet (325 mg total) by mouth every other day. ?   ?furosemide 20 MG tablet ?Commonly known as: LASIX ?Take 1 tablet (20 mg total) by mouth daily as needed for fluid or edema. ?What changed:  ?when to take this ?reasons to take  this ?   ?gabapentin 300 MG capsule ?Commonly known as: NEURONTIN ?Take 1 capsule (300 mg total) by mouth 3 (three) times daily. ?   ?hydrOXYzine 10 MG tablet ?Commonly known as: ATARAX ?Take 1 tablet (10 mg

## 2022-03-25 NOTE — Care Management Important Message (Signed)
Important Message ? ?Patient Details  ?Name: Jeremiah [name] ?MRN: [id number] ?Date of Birth: 1950-12-15 ? ? ?Medicare Important Message Given:  Yes ? ? ? ? ?Tommy Medal ?03/25/2022, 11:35 AM ?

## 2022-03-25 NOTE — Discharge Summary (Signed)
Physician Discharge Summary  ?MACDONALD [name] DOB: 12-22-1950 DOA: 03/22/2022 ? ?PCP: Pcp, No ? ?Admit date: 03/22/2022 ?Discharge date: 03/25/2022 ? ?Admitted From: [name]  ?Disposition:  [name]  ? ?Recommendations for Outpatient Follow-up:  ?Follow up with PCP in 1 week ?Follow-up with cardiology as scheduled 5/25 ? ?Discharge Condition: Stable ?CODE STATUS: Full  ?Diet recommendation:  ?Diet Orders (From admission, onward)  ? ?  Start     Ordered  ? 03/25/22 0000  Diet - low sodium heart healthy       ? 03/25/22 2458  ? 03/22/22 2041  Diet Heart Room service appropriate? Yes; Fluid consistency: Thin  Diet effective now       ?Question Answer Comment  ?Room service appropriate? Yes   ?Fluid consistency: Thin   ?  ? 03/22/22 2040  ? ?  ?  ? ?  ? ?Brief/Interim Summary: ?Jeremiah [name] is a 71 y.o. male with medical history significant for systolic and diastolic CHF, hypertension, alcohol abuse, atrial flutter, pulmonary embolism, CABG x3. ?  ?Patient was brought to the ED with reports of hypotension and passing out.  Patient states that he was in the shower, he felt a little lightheaded, he got out up and was walking to his bedroom, the dizziness worsened and went to sit down on a chair.  And he passed out.  He denies chest pain, no difficulty breathing.  EMS arrived, it was documented that his blood pressure was down to 83/58, ED provider tells me recordings down to 61 systolic was noted on the scene.  Patient was given IV fluids, antihypertensives on hold. ? ?Cardiology was consulted.  Orthostatic vital signs were negative.  His antihypertensive was resumed at a lower dose.  With IV fluid, AKI resolved. ? ?Discharge Diagnoses:  ? ?Principal Problem: ?  Syncope ?Active Problems: ?  Prosthetic joint infection ([place]) ?  Chronic combined systolic and diastolic heart failure ([place]) ?  Alcohol abuse ?  Essential hypertension ?  Atrial flutter ([place]) ?  AKI (acute kidney injury) ([place]) ?   History of coronary artery bypass graft x 3 ? ? ?Syncope versus presyncope, secondary to hypotension ?-Orthostatic vital signs negative 5/3 ?-Follow-up with cardiology as outpatient ?  ?AKI ?-Resolved ? ?Paroxysmal a flutter ?-Currently on telemetry, normal sinus rhythm ?-Not on beta-blocker therapy due to chronic trifascicular block, history of pauses with beta-blockers in the past ?  ?Demand ischemia ?-Troponin trend is flat 19, 52, 91, 60 ?-Cardiology consulted ?  ?Chronic systolic heart failure ?-Resume Lasix as needed prn severe leg swelling or 4 lbs weight gain ?  ?CAD ?-Aspirin, Crestor ? ?Hypertension ?-Resume lisinopril and Aldactone at lower dose.  Changed Lasix to use as needed basis ?  ?Chronic pain ?-Continue MS Contin ?-Oxycodone as needed ? ?Discharge Instructions ? ?Discharge Instructions   ? ? (HEART FAILURE PATIENTS) Call MD:  Anytime you have any of the following symptoms: 1) 3 pound weight gain in 24 hours or 5 pounds in 1 week 2) shortness of breath, with or without a dry hacking cough 3) swelling in the hands, feet or stomach 4) if you have to sleep on extra pillows at night in order to breathe.   Complete by: As directed ?  ? Call MD for:  difficulty breathing, headache or visual disturbances   Complete by: As directed ?  ? Call MD for:  extreme fatigue   Complete by: As directed ?  ? Call MD for:  persistant dizziness or light-headedness   Complete by: As directed ?  ? Call MD for:  persistant nausea and vomiting   Complete by: As directed ?  ? Call MD for:  severe uncontrolled pain   Complete by: As directed ?  ? Call MD for:  temperature >100.4   Complete by: As directed ?  ? Diet - low sodium heart healthy   Complete by: As directed ?  ? Discharge instructions   Complete by: As directed ?  ? You were cared for by a hospitalist during your hospital stay. If you have any questions about your discharge medications or the care you received while you were in the hospital after you are  discharged, you can call the unit and ask to speak with the hospitalist on call if the hospitalist that took care of you is not available. Once you are discharged, your primary care physician will handle any further medical issues. Please note that NO REFILLS for any discharge medications will be authorized once you are discharged, as it is imperative that you return to your primary care physician (or establish a relationship with a primary care physician if you do not have one) for your aftercare needs so that they can reassess your need for medications and monitor your lab values.  ? Increase activity slowly   Complete by: As directed ?  ? ?  ? ?Allergies as of 03/25/2022   ? ?   Reactions  ? Morphine Hives, Swelling, Rash  ? Has taken with no reaction since original reaction  ? ?  ? ?  ?Medication List  ?  ? ?STOP taking these medications   ? ?Ensure Max Protein Liqd ?  ?lidocaine 5 % ?Commonly known as: LIDODERM ?  ?melatonin 5 MG Tabs ?  ?QUEtiapine 50 MG tablet ?Commonly known as: SEROQUEL ?  ?tiZANidine 2 MG tablet ?Commonly known as: ZANAFLEX ?  ? ?  ? ?TAKE these medications   ? ?acetaminophen 325 MG tablet ?Commonly known as: TYLENOL ?Take 2 tablets (650 mg total) by mouth every 6 (six) hours as needed for mild pain, fever or headache. ?  ?allopurinol 100 MG tablet ?Commonly known as: ZYLOPRIM ?Take 1 tablet (100 mg total) by mouth daily. ?  ?aspirin 81 MG EC tablet ?Take 1 tablet (81 mg total) by mouth daily. Swallow whole. ?  ?buPROPion 300 MG 24 hr tablet ?Commonly known as: WELLBUTRIN XL ?Take 1 tablet (300 mg total) by mouth every morning. ?  ?busPIRone 15 MG tablet ?Commonly known as: BUSPAR ?Take 1 tablet (15 mg total) by mouth 3 (three) times daily. ?  ?cyanocobalamin 1000 MCG tablet ?Take 1 tablet (1,000 mcg total) by mouth daily. ?  ?docusate sodium 100 MG capsule ?Commonly known as: COLACE ?Take 1 capsule (100 mg total) by mouth 2 (two) times daily. ?  ?enalapril 2.5 MG tablet ?Commonly known as:  VASOTEC ?Take 1 tablet (2.5 mg total) by mouth daily. ?What changed:  ?medication strength ?how much to take ?  ?ferrous sulfate 325 (65 FE) MG tablet ?Take 1 tablet (325 mg total) by mouth every other day. ?  ?furosemide 20 MG tablet ?Commonly known as: LASIX ?Take 1 tablet (20 mg total) by mouth daily as needed for fluid or edema. ?What changed:  ?when to take this ?reasons to take this ?  ?gabapentin 300 MG capsule ?Commonly known as: NEURONTIN ?Take 1 capsule (300 mg total) by mouth 3 (three) times daily. ?  ?hydrOXYzine 10 MG tablet ?Commonly  known as: ATARAX ?Take 1 tablet (10 mg total) by mouth every 4 (four) hours as needed for anxiety. ?What changed:  ?when to take this ?Another medication with the same name was removed. Continue taking this medication, and follow the directions you see here. ?  ?morphine 15 MG 12 hr tablet ?Commonly known as: MS CONTIN ?Take 1 tablet (15 mg total) by mouth every 12 (twelve) hours. ?  ?multivitamin with minerals Tabs tablet ?Take 1 tablet by mouth 2 (two) times daily. ?  ?pantoprazole 40 MG tablet ?Commonly known as: PROTONIX ?Take 1 tablet (40 mg total) by mouth 2 (two) times daily. ?  ?rosuvastatin 40 MG tablet ?Commonly known as: CRESTOR ?Take 1 tablet (40 mg total) by mouth daily. ?  ?spironolactone 25 MG tablet ?Commonly known as: ALDACTONE ?Take 0.5 tablets (12.5 mg total) by mouth daily. ?What changed: how much to take ?  ?Vitamin D3 25 MCG tablet ?Commonly known as: Vitamin D ?Take 1 tablet (1,000 Units total) by mouth daily. ?  ?zolpidem 5 MG tablet ?Commonly known as: AMBIEN ?Take 1 tablet (5 mg total) by mouth at bedtime as needed for sleep. ?  ? ?  ? ? Follow-up Information   ? ? Barrett, Evelene Croon, PA-C Follow up on 04/14/2022.   ?Specialties: Cardiology, Radiology ?Why: Cardiology Hospital Follow-up on 04/14/2022 at 2:30 PM. ?Contact information: ?[address] ?[place] [address] ?[phone] ? ? ?  ?  ? ?  ?  ? ?  ? ?Allergies  ?Allergen Reactions  ? Morphine  Hives, Swelling and Rash  ?  Has taken with no reaction since original reaction  ? ? ?Consultations: ?Cardiology  ? ? ?Procedures/Studies: ?DG Shoulder Right ? ?Result Date: 02/28/2022 ?CLINICAL DATA:  Chronic pain

## 2022-03-25 NOTE — TOC Transition Note (Signed)
Transition of Care (TOC) - CM/SW Discharge Note ? ? ?Patient Details  ?Name: MARLON SULEIMAN ?MRN: [id number] ?Date of Birth: 04-03-1951 ? ?Transition of Care (TOC) CM/SW Contact:  ?Shade Flood, LCSW ?Phone Number: ?03/25/2022, 11:16 AM ? ? ?Clinical Narrative:    ? ?Pt stable for dc back to his ALF today per MD. Updated ALF. DC clinical faxed and included in DC packet. Transport arranged by Therapist, sports. There are no other TOC needs for dc. ? ?Final next level of care: Assisted Living ?Barriers to Discharge: Barriers Resolved ? ? ?Patient Goals and CMS Choice ?Patient states their goals for this hospitalization and ongoing recovery are:: Return to ALF ?CMS Medicare.gov Compare Post Acute Care list provided to:: Patient ?Choice offered to / list presented to : Patient ? ?Discharge Placement ?  ?           ?  ?  ?  ?  ? ?Discharge Plan and Services ?In-house Referral: Clinical Social Work ?  ?           ?  ?  ?  ?  ?  ?  ?  ?  ?  ?  ? ?Social Determinants of Health (SDOH) Interventions ?  ? ? ?Readmission Risk Interventions ? ?  03/23/2022  ? 12:11 PM 03/02/2022  ?  1:30 PM 02/21/2022  ? 11:53 AM  ?Readmission Risk Prevention Plan  ?Transportation Screening Complete Complete Complete  ?Medication Review Press photographer) Complete Complete Complete  ?PCP or Specialist appointment within 3-5 days of discharge  Complete Complete  ?[place] or Home Care Consult Complete Complete Complete  ?SW Recovery Care/Counseling Consult Complete Complete Complete  ?Palliative Care Screening Not Applicable  Not Applicable  ?[place] Not Applicable Not Applicable Not Applicable  ? ? ? ? ? ?

## 2022-03-27 LAB — CULTURE, BLOOD (ROUTINE X 2)
Culture: NO GROWTH
Culture: NO GROWTH
Special Requests: ADEQUATE

## 2022-04-14 ENCOUNTER — Encounter: Payer: Self-pay | Admitting: Physician Assistant

## 2022-04-14 ENCOUNTER — Ambulatory Visit: Payer: Medicare Other | Admitting: Orthopaedic Surgery

## 2022-04-14 ENCOUNTER — Ambulatory Visit: Payer: Medicare Other | Admitting: Physician Assistant
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
## 2022-04-26 ENCOUNTER — Ambulatory Visit: Payer: Medicare Other | Admitting: Orthopaedic Surgery

## 2022-05-10 ENCOUNTER — Ambulatory Visit: Payer: Medicare Other | Admitting: Orthopaedic Surgery

## 2022-05-10 ENCOUNTER — Telehealth: Payer: Self-pay | Admitting: Orthopedic Surgery

## 2022-05-10 NOTE — Telephone Encounter (Signed)
Yes I would say he needs to come in maybe Friday morning to be seen just because we need to get that antibiotic spacer out of his shoulder thanks

## 2022-05-10 NOTE — Telephone Encounter (Signed)
Patient came in today for appt with Dr. Erlinda Hong but had to get it rescheduled being he was set an appt with the wrong doctor on 06/06, is there anyway we can get this patient in to be seen any earlier than 07/07? We were unsuccessful getting in contact with him being he still had his old address and phone number in the system and he could not come to [place] being the state has taken his license. I have updates his information in the system and I have a new person of contact if we cannot get in contact with him we should contact Rodena Piety at [phone], she helps him get around, being he lives an hour away, and keeps his appts in order. Please advise

## 2022-05-11 NOTE — Telephone Encounter (Signed)
IC no answer. LMVM advising needed to call back to schedule appointment for Friday morning for patient to see Dr Marlou Sa
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
## 2022-05-30 ENCOUNTER — Ambulatory Visit: Payer: Medicare Other | Admitting: Orthopedic Surgery

## 2022-06-08 ENCOUNTER — Ambulatory Visit: Payer: Medicare Other | Admitting: Cardiovascular Disease

## 2022-06-08 ENCOUNTER — Encounter: Payer: Self-pay | Admitting: Cardiovascular Disease

## 2022-06-08 VITALS — BP 110/72 | HR 80 | Ht 67.0 in | Wt 186.8 lb

## 2022-06-08 DIAGNOSIS — Z79899 Other long term (current) drug therapy: Secondary | ICD-10-CM

## 2022-06-08 DIAGNOSIS — Z951 Presence of aortocoronary bypass graft: Secondary | ICD-10-CM

## 2022-06-08 DIAGNOSIS — I10 Essential (primary) hypertension: Secondary | ICD-10-CM | POA: Diagnosis not present

## 2022-06-08 LAB — BASIC METABOLIC PANEL
BUN/Creatinine Ratio: 19 (ref 10–24)
BUN: 23 mg/dL (ref 8–27)
CO2: 24 mmol/L (ref 20–29)
Calcium: 9 mg/dL (ref 8.6–10.2)
Chloride: 102 mmol/L (ref 96–106)
Creatinine, Ser: 1.23 mg/dL (ref 0.76–1.27)
Glucose: 105 mg/dL — ABNORMAL HIGH (ref 70–99)
Potassium: 4.5 mmol/L (ref 3.5–5.2)
Sodium: 140 mmol/L (ref 134–144)
eGFR: 64 mL/min/{1.73_m2} (ref >59)

## 2022-06-08 MED ORDER — BUMETANIDE 1 MG PO TABS: 1.0000 mg | ORAL_TABLET | Freq: Every day | ORAL | 3 refills | Status: AC

## 2022-06-08 NOTE — Patient Instructions (Addendum)
Medication Instructions:  Your physician has recommended you make the following change in your medication:  1-STOP Lasix (Furosemide) 2-START Bumex (Bumetanide)1 mg by mouth daily.  *If you need a refill on your cardiac medications before your next appointment, please call your pharmacy*  Lab Work: Your physician recommends that you have lab work today- BMET  If you have labs (blood work) drawn today and your tests are completely normal, you will receive your results only by: MyChart Message (if you have MyChart) OR A paper copy in the mail If you have any lab test that is abnormal or we need to change your treatment, we will call you to review the results.  Testing/Procedures: None ordered today.  Follow-Up: At [facility], you and your health needs are our priority.  As part of our continuing mission to provide you with exceptional heart care, we have created designated Provider Care Teams.  These Care Teams include your primary Cardiologist (physician) and Advanced Practice Providers (APPs -  Physician Assistants and Nurse Practitioners) who all work together to provide you with the care you need, when you need it.  We recommend signing up for the patient portal called "MyChart".  Sign up information is provided on this After Visit Summary.  MyChart is used to connect with patients for Virtual Visits (Telemedicine).  Patients are able to view lab/test results, encounter notes, upcoming appointments, etc.  Non-urgent messages can be sent to your provider as well.   To learn more about what you can do with MyChart, go to [email or web address].    Your next appointment:   12 month(s)  The format for your next appointment:   In Person  Provider:   Jenkins Rouge, MD {    Important Information About Sugar

## 2022-06-13 ENCOUNTER — Ambulatory Visit: Payer: Medicare Other | Admitting: Orthopedic Surgery

## 2022-08-01 ENCOUNTER — Ambulatory Visit (INDEPENDENT_AMBULATORY_CARE_PROVIDER_SITE_OTHER): Payer: Medicare Other | Admitting: Orthopedic Surgery

## 2022-08-01 ENCOUNTER — Ambulatory Visit (INDEPENDENT_AMBULATORY_CARE_PROVIDER_SITE_OTHER): Payer: Medicare Other

## 2022-08-01 DIAGNOSIS — M25512 Pain in left shoulder: Secondary | ICD-10-CM

## 2022-08-02 ENCOUNTER — Telehealth: Payer: Self-pay | Admitting: *Deleted

## 2022-08-02 ENCOUNTER — Encounter: Payer: Self-pay | Admitting: Orthopedic Surgery

## 2022-08-02 NOTE — Telephone Encounter (Signed)
   Pre-operative Risk Assessment    Patient Name: Jeremiah [name]  DOB: 09/29/1951 MRN: [id number]      Request for Surgical Clearance    Procedure:   LEFT SHOULDER REMOVAL ANTIBIOTIC SPACER  Date of Surgery:  Clearance TBD                                 Surgeon:  DR. Anderson Malta Surgeon's Group or Practice Name:  [facility] Phone number:  [phone] Fax number:  [phone]   Type of Clearance Requested:   - Medical ; ASA    Type of Anesthesia:  General    Additional requests/questions:    [name]   08/02/2022, 3:04 PM
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
## 2022-08-04 NOTE — Telephone Encounter (Signed)
   Patient Name: FLORA RATZ  DOB: March 19, 1951 MRN: [id number]  Primary Cardiologist: Jenkins Rouge, MD  Chart reviewed as part of pre-operative protocol coverage. Very complex hx noted including remote CABG 1998, chronic trifascicular block, syncope 03/2022, chronic HFrEF with EF 35-40%, ascending TAA, HTN, HLD, PAF no anticoagulation, also homelessness, polysubstance abuse, and falls. Possible Myoview for surgery mentioned in Dr. Kyla Balzarine last note -> Patient is now pending left shoulder removal antibiotic spacer and needs clearance for general anesthesia and to hold ASA. I will route to Dr. Johnsie Cancel for input on cardiac clearance and holding ASA given his commentary at recent [facility]. Dr. Johnsie Cancel - Please route response to [facility] PREOP (the pre-op pool). Thank you.    Charlie Pitter, PA-C 08/04/2022, 1:12 PM

## 2022-08-08 NOTE — Telephone Encounter (Signed)
   Name: Jeremiah [name]  DOB: 04-11-1951  MRN: [id number]  Primary Cardiologist: Jenkins Rouge, MD  Chart reviewed as part of pre-operative protocol coverage. Because of Jeremiah [name]'s past medical history and time since last visit, he will require a follow-up telephone visit in order to better assess preoperative cardiovascular risk.  Pre-op covering staff: - Please schedule appointment and call patient to inform them. If patient already had an upcoming appointment within acceptable timeframe, please add "pre-op clearance" to the appointment notes so provider is aware. - Please contact requesting surgeon's office via preferred method (i.e, phone, fax) to inform them of need for appointment prior to surgery.  Per Dr. Alfonse Spruce okay to hold ASA x7 days prior to the procedure.  Please resume when medically safe to do so.  Jeremiah Collard, PA-C  08/08/2022, 1:07 PM

## 2022-08-09 NOTE — Telephone Encounter (Signed)
Vm full, could not leave message to call back for tele pre op appt

## 2022-08-10 NOTE — Telephone Encounter (Signed)
Ok thx he needs pre op cards eval thx

## 2022-08-10 NOTE — Telephone Encounter (Signed)
Vm full, could not leave message to call back for tele pre op appt. I will update the requesting office.

## 2022-08-11 NOTE — Telephone Encounter (Signed)
Call placed to pt regarding surgical clearance, voicemail full.  Called wife's #, DPR on file, and # is no longer in service.

## 2022-08-12 NOTE — Telephone Encounter (Signed)
Our office has attempted x 3 to reach pt to schedule a televisit for pre op. I will send a letter to the pt to call the office for a tele pre op appt. Will remove from the call back at this time.

## 2022-09-14 ENCOUNTER — Telehealth: Payer: Self-pay | Admitting: Orthopedic Surgery

## 2022-09-14 NOTE — Telephone Encounter (Signed)
This patient is no longer at the assisted living facility associated with the administrator Fabio Asa.  Mrs Maretta Bees had provided her number in addition to the patient's telephone number on the day patient was seen. I reached out to Mrs Maretta Bees again last week to have the patient call regarding clearance and surgery.  She stated the patient has been moved to [facility] and provided a contact person.  I called and spoke with Lizbeth Bark -director of [facility]  [phone] to let her know Dr. Kyla Balzarine office has tried to reach patient so we can get patient scheduled for left shoulder surgery.  Tammy has take the information and will call today for an appointment. She also has been given my name and contact number and will work to get patient set up for transportation and surgery.

## 2022-09-14 NOTE — Telephone Encounter (Signed)
fyi

## 2022-09-15 ENCOUNTER — Telehealth: Payer: Self-pay | Admitting: Cardiovascular Disease

## 2022-09-15 ENCOUNTER — Telehealth: Payer: Self-pay | Admitting: *Deleted

## 2022-09-15 NOTE — Telephone Encounter (Signed)
I s/w Sunday Spillers, RN (1st shift supervisor at [employer]). Sunday Spillers, helped set up tele pre op appt for the pt. She will be with the pt for the tele appt. Sunday Spillers and I have gone through the pt's medications. Consent has been given.

## 2022-09-15 NOTE — Telephone Encounter (Signed)
Sunday Spillers from [facility] called to set up patient's tele visit appt.

## 2022-09-15 NOTE — Telephone Encounter (Signed)
I s/w Sunday Spillers, RN (1st shift supervisor at [employer]). Sunday Spillers, helped set up tele pre op appt for the pt. She will be with the pt for the tele appt. Sunday Spillers and I have gone through the pt's medications. Consent has been given.      Patient Consent for Virtual Visit        Jeremiah [name] has provided verbal consent on 09/15/2022 for a virtual visit (video or telephone).   CONSENT FOR VIRTUAL VISIT FOR:  Jeremiah [name]  By participating in this virtual visit I agree to the following:  I hereby voluntarily request, consent and authorize [facility] and its employed or contracted physicians, physician assistants, nurse practitioners or other licensed health care professionals (the Practitioner), to provide me with telemedicine health care services (the "Services") as deemed necessary by the treating Practitioner. I acknowledge and consent to receive the Services by the Practitioner via telemedicine. I understand that the telemedicine visit will involve communicating with the Practitioner through live audiovisual communication technology and the disclosure of certain medical information by electronic transmission. I acknowledge that I have been given the opportunity to request an in-person assessment or other available alternative prior to the telemedicine visit and am voluntarily participating in the telemedicine visit.  I understand that I have the right to withhold or withdraw my consent to the use of telemedicine in the course of my care at any time, without affecting my right to future care or treatment, and that the Practitioner or I may terminate the telemedicine visit at any time. I understand that I have the right to inspect all information obtained and/or recorded in the course of the telemedicine visit and may receive copies of available information for a reasonable fee.  I understand that some of the potential risks of receiving the Services via telemedicine  include:  Delay or interruption in medical evaluation due to technological equipment failure or disruption; Information transmitted may not be sufficient (e.g. poor resolution of images) to allow for appropriate medical decision making by the Practitioner; and/or  In rare instances, security protocols could fail, causing a breach of personal health information.  Furthermore, I acknowledge that it is my responsibility to provide information about my medical history, conditions and care that is complete and accurate to the best of my ability. I acknowledge that Practitioner's advice, recommendations, and/or decision may be based on factors not within their control, such as incomplete or inaccurate data provided by me or distortions of diagnostic images or specimens that may result from electronic transmissions. I understand that the practice of medicine is not an exact science and that Practitioner makes no warranties or guarantees regarding treatment outcomes. I acknowledge that a copy of this consent can be made available to me via my patient portal ([facility]), or I can request a printed copy by calling the office of [facility].    I understand that my insurance will be billed for this visit.   I have read or had this consent read to me. I understand the contents of this consent, which adequately explains the benefits and risks of the Services being provided via telemedicine.  I have been provided ample opportunity to ask questions regarding this consent and the Services and have had my questions answered to my satisfaction. I give my informed consent for the services to be provided through the use of telemedicine in my medical care

## 2022-09-15 NOTE — Telephone Encounter (Signed)
Thx very much

## 2022-09-20 ENCOUNTER — Ambulatory Visit: Payer: Medicare Other | Attending: Cardiology | Admitting: Nurse Practitioner

## 2022-09-20 DIAGNOSIS — Z01810 Encounter for preprocedural cardiovascular examination: Secondary | ICD-10-CM | POA: Diagnosis not present
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
## 2022-10-04 ENCOUNTER — Encounter (HOSPITAL_COMMUNITY): Payer: Self-pay | Admitting: Physician Assistant

## 2022-10-04 NOTE — Anesthesia Preprocedure Evaluation (Signed)
Anesthesia Evaluation    Airway        Dental   Pulmonary           Cardiovascular hypertension,      Neuro/Psych    GI/Hepatic   Endo/Other    Renal/GU      Musculoskeletal   Abdominal   Peds  Hematology   Anesthesia Other Findings   Reproductive/Obstetrics                              Anesthesia Physical Anesthesia Plan  ASA:   Anesthesia Plan:    Post-op Pain Management:    Induction:   PONV Risk Score and Plan:   Airway Management Planned:   Additional Equipment:   Intra-op Plan:   Post-operative Plan:   Informed Consent:   Plan Discussed with:   Anesthesia Plan Comments: (PAT note by Karoline Caldwell, PA-C: Follows with cardiology for CAD s/p CABG x 3 1998, history of hypotension and syncope, HTN, HLD, PAF (not anticoagulated due to history of bleeding), HFrEF, heart valve insufficiency, aortic dilatation, and edema TTE 01/16/22 EF 35-40% normal RV mild MR mild.mod TR mild AR.  Last stress test 2014 was nonischemic.  Seen by Finis Bud, NP via televisit 09/20/2022 for preop evaluation.  Per note, "Jeremiah [name]'s perioperative risk of a major cardiac event is 0.9% according to the Revised Cardiac Risk Index (RCRI).  Therefore, he is at low risk for perioperative complications.   His functional capacity is good at 5.07 METs according to the Duke Activity Status Index (DASI). Recommendations: According to ACC/AHA guidelines, no further cardiovascular testing needed.  The patient may proceed to surgery at acceptable risk.  Antiplatelet and/or Anticoagulation Recommendations: Aspirin can be held for 7 days prior to his surgery.  Please resume Aspirin post operatively when it is felt to be safe from a bleeding standpoint. Pt does not require SBE prophylaxis prior to surgery.  Per Dr. Alfonse Spruce, okay to hold ASA x 7 days prior to the procedure. Please resume when medically safe to do  so."  Patient will need day of surgery labs and evaluation.  EKG 03/22/2022: Sinus rhythm.  Rate 86.  Prolonged PR interval (EPR 226 ms), right bundle branch block, left anterior fascicular block.  TTE 01/16/2022:  1. Left ventricular ejection fraction, by estimation, is 35 to 40%. The  left ventricle has moderately decreased function. The left ventricle  demonstrates global hypokinesis. The left ventricular internal cavity size  was mildly dilated. There is mild  left ventricular hypertrophy. Left ventricular diastolic parameters are  indeterminate.   2. Right ventricular systolic function is normal. The right ventricular  size is normal. There is normal pulmonary artery systolic pressure.   3. Left atrial size was moderately dilated.   4. The mitral valve is normal in structure. Mild mitral valve  regurgitation. No evidence of mitral stenosis.   5. Tricuspid valve regurgitation is mild to moderate.   6. The aortic valve is tricuspid. Aortic valve regurgitation is mild.  Aortic valve sclerosis is present, with no evidence of aortic valve  stenosis.   7. Aortic dilatation noted. There is mild dilatation of the ascending  aorta, measuring 43 mm.   8. The inferior vena cava is dilated in size with >50% respiratory  variability, suggesting right atrial pressure of 8 mmHg.   Nuclear stress 07/15/2013: Overall Impression:  Normal stress nuclear study.   LV Ejection Fraction: 54%.  LV Wall Motion:  NL LV Function; NL Wall Motion  )         Anesthesia Quick Evaluation

## 2022-10-04 NOTE — Progress Notes (Signed)
Anesthesia Chart Review: Same day workup  Follows with cardiology for CAD s/p CABG x 3 1998, history of hypotension and syncope, HTN, HLD, PAF (not anticoagulated due to history of bleeding), HFrEF, heart valve insufficiency, aortic dilatation, and edema TTE 01/16/22 EF 35-40% normal RV mild MR mild.mod TR mild AR.  Last stress test 2014 was nonischemic.  Seen by Jeremiah Bud, Jeremiah [name] via televisit 09/20/2022 for preop evaluation.  Per note, "Jeremiah [name]'s perioperative risk of a major cardiac event is 0.9% according to the Revised Cardiac Risk Index (RCRI).  Therefore, he is at low risk for perioperative complications.   His functional capacity is good at 5.07 METs according to the Duke Activity Status Index (DASI). Recommendations: According to ACC/AHA guidelines, no further cardiovascular testing needed.  The patient may proceed to surgery at acceptable risk.  Antiplatelet and/or Anticoagulation Recommendations: Aspirin can be held for 7 days prior to his surgery.  Please resume Aspirin post operatively when it is felt to be safe from a bleeding standpoint. Pt does not require SBE prophylaxis prior to surgery.  Per Jeremiah [name], okay to hold ASA x 7 days prior to the procedure. Please resume when medically safe to do so."  Patient will need day of surgery labs and evaluation.  EKG 03/22/2022: Sinus rhythm.  Rate 86.  Prolonged PR interval (EPR 226 ms), right bundle branch block, left anterior fascicular block.  TTE 01/16/2022:  1. Left ventricular ejection fraction, by estimation, is 35 to 40%. The  left ventricle has moderately decreased function. The left ventricle  demonstrates global hypokinesis. The left ventricular internal cavity size  was mildly dilated. There is mild  left ventricular hypertrophy. Left ventricular diastolic parameters are  indeterminate.   2. Right ventricular systolic function is normal. The right ventricular  size is normal. There is normal pulmonary artery systolic pressure.    3. Left atrial size was moderately dilated.   4. The mitral valve is normal in structure. Mild mitral valve  regurgitation. No evidence of mitral stenosis.   5. Tricuspid valve regurgitation is mild to moderate.   6. The aortic valve is tricuspid. Aortic valve regurgitation is mild.  Aortic valve sclerosis is present, with no evidence of aortic valve  stenosis.   7. Aortic dilatation noted. There is mild dilatation of the ascending  aorta, measuring 43 mm.   8. The inferior vena cava is dilated in size with >50% respiratory  variability, suggesting right atrial pressure of 8 mmHg.   Nuclear stress 07/15/2013: Overall Impression:  Normal stress nuclear study.   LV Ejection Fraction: 54%.  LV Wall Motion:  NL LV Function; NL Wall Motion    Jeremiah [name]

## 2022-10-05 ENCOUNTER — Telehealth: Payer: Self-pay | Admitting: Orthopedic Surgery

## 2022-10-05 NOTE — Progress Notes (Signed)
Per Jackelyn Poling from Dr. Randel Pigg office, the pt's surgery will be rescheduled once his toe heals. Jackelyn Poling will reach out to the facility with the information.

## 2022-10-05 NOTE — Telephone Encounter (Signed)
Thanks for the update debbie

## 2022-10-05 NOTE — Telephone Encounter (Signed)
Called Sunday Spillers the nurse with [facility] to advise the surgery will be cancelled for tomorrow and rescheduled once the toe is healed.  She will send updates on patient's toe to [facility] or call the office to provide an update.  Sunday Spillers also has my direct number.  She declined an appointment with Dr. Sharol Given in our office because they have some one on site handling wound care.

## 2022-10-05 NOTE — Progress Notes (Signed)
S.D.W- Instructions   Your procedure is scheduled on Thurs., Nov. 16, 2023 from 10:54AM-12:54PM.  Report to [facility] Main Entrance "A" at 10:30 A.M., then check in with the Admitting office.  Call this number if you have problems the morning of surgery:  [phone]             If you experience any cold or flu symptoms such as cough, fever, chills, shortness of breath, etc. between now and your scheduled surgery, please notify us at the above         number.  Remember:  Do not eat after midnight on Nov. 15th  You may drink clear liquids until 3 hours (7:50AM) prior to surgery time the morning of your surgery.   Clear liquids allowed are: Water, Non-Citrus Juices (without pulp), Carbonated Beverages, Clear Tea, Black Coffee ONLY (NO MILK, CREAM OR POWDERED CREAMER of any kind), and Gatorade    Take these medicines the morning of surgery with A SIP OF WATER: Please provide the exact times the medicines were taken for anesthesia accuracy. Allopurinol (ZYLOPRIM)  BuPROPion (WELLBUTRIN XL)  Gabapentin (NEURONTIN)  HydrOXYzine (ATARAX)  Morphine (MS CONTIN)  Pantoprazole (PROTONIX)  Rosuvastatin (CRESTOR)   If Needed: Acetaminophen (TYLENOL)   As of today, STOP taking any Aspirin (unless otherwise instructed by your surgeon) Aleve, Naproxen, Ibuprofen, Motrin, Advil, Goody's, BC's, all herbal medications, fish oil, and all vitamins.          Do not wear jewelry. Do not wear lotions, powders, cologne or deodorant. Do not shave 48 hours prior to surgery.  Men may shave face and neck. Do not bring valuables to the hospital.  [facility] is not responsible for any belongings or valuables.    Do NOT Smoke (Tobacco/Vaping)  24 hours prior to your procedure  If you use a CPAP at night, you may bring your mask for your overnight stay.   Contacts, glasses, hearing aids, dentures or partials may not be worn into surgery, please bring cases for these belongings   For patients admitted to  the hospital, discharge time will be determined by your treatment team.   Patients discharged the day of surgery will not be allowed to drive home, and someone needs to stay with them for 24 hours.  Special instructions:    Oral Hygiene is also important to reduce your risk of infection.  Remember - BRUSH YOUR TEETH THE MORNING OF SURGERY WITH YOUR REGULAR TOOTHPASTE  [place]- Preparing For Surgery  Before surgery, you can play an important role. Because skin is not sterile, your skin needs to be as free of germs as possible. You can reduce the number of germs on your skin by washing with Antibacterial Soap before surgery.     Please follow these instructions carefully.     Shower the NIGHT BEFORE SURGERY and the MORNING OF SURGERY with Antibacterial Soap.   Pat yourself dry with a CLEAN TOWEL.  Wear CLEAN PAJAMAS to bed the night before surgery  Place CLEAN SHEETS on your bed the night before your surgery  DO NOT SLEEP WITH PETS.  Day of Surgery:  Take a shower with Antibacterial soap. Wear Clean/Comfortable clothing the morning of surgery Do not apply any deodorants/lotions.   Remember to brush your teeth WITH YOUR REGULAR TOOTHPASTE.   If you test positive for Covid, or been in contact with anyone that has tested positive in the last 10 days, please notify your surgeon.  SURGICAL WAITING ROOM VISITATION Patients having  surgery or a procedure may have no more than 2 support people in the waiting area - these visitors may rotate.   Children under the age of 6 must have an adult with them who is not the patient. If the patient needs to stay at the hospital during part of their recovery, the visitor guidelines for inpatient rooms apply. Pre-op nurse will coordinate an appropriate time for 1 support person to accompany patient in pre-op.  This support person may not rotate.   Please refer to the [facility] website for the visitor guidelines for Inpatients (after your  surgery is over and you are in a regular room).

## 2022-10-05 NOTE — Telephone Encounter (Signed)
Patient is having his pre op via telephone with Minna Merritts.  Minna Merritts would like to know if the patient will have shoulder surgery tomorrow.  Sunday Spillers, the nurse at [facility] taking care of Mr. Ryle states the patient has an infected toe (describing as a blister initially)  and is going to start antibiotics.  Sunday Spillers will send photo of the toe to [facility] to try and determine if Dr. Marlou Sa will allow the patient to proceed or will need to be pushed back until the toe has healed.

## 2022-10-05 NOTE — Progress Notes (Signed)
Spoke with Jackelyn Poling, the surgery scheduler for Dr. Marlou Sa, and she states that Dr. Marlou Sa needs to speak with the facility to determine if the surgery will proceed. Called Sunday Spillers to make her aware to expect a phone call from the surgeon's office, as well.Awaiting an update.

## 2022-10-05 NOTE — Progress Notes (Signed)
While attempting to fax over pre-op instructions for this pt, Jeremiah [name], nurse from [facility] [phone] states that the pt has a red, swollen, and raw 5th toe on his left foot from where his shoe has been rubbing since last week. Jeremiah [name] states at the time last week when the pt noticed it he didn't think to tell anyone. Today she contacted his pcp, and the pt will be starting a course of antibiotics this afternoon. Jeremiah [name] is wondering of the the pt will still have his surgery on tomorrow 11/16. Will reach out to Dr. Randel Pigg office.

## 2022-10-05 NOTE — Telephone Encounter (Signed)
Nursing home advised that patient is scheduled for surgery tomorrow am, and is now being treated for an infection on his toe. Questioning if the surgery will still be preformed tomorrow..please call [phone] or sylvia

## 2022-10-05 NOTE — Telephone Encounter (Signed)
We need to hold off on surgery until that toes either removed or completely healed.  This is some mild with good surgery to remove the antibiotic spacer.  Plan for potential surgery in 4 weeks at the earliest

## 2022-10-06 ENCOUNTER — Ambulatory Visit (HOSPITAL_COMMUNITY): Admission: RE | Admit: 2022-10-06 | Payer: Medicare Other | Source: Home / Self Care | Admitting: Orthopedic Surgery

## 2022-10-06 DIAGNOSIS — Z01818 Encounter for other preprocedural examination: Secondary | ICD-10-CM

## 2022-10-06 SURGERY — REMOVAL, HARDWARE
Anesthesia: General | Laterality: Left

## 2022-10-17 ENCOUNTER — Encounter: Payer: Medicare Other | Admitting: Orthopedic Surgery

## 2022-12-20 ENCOUNTER — Other Ambulatory Visit: Payer: Self-pay

## 2022-12-20 ENCOUNTER — Telehealth: Payer: Self-pay | Admitting: Orthopedic Surgery

## 2022-12-20 NOTE — Telephone Encounter (Signed)
Hold asa 5 d pre op thx

## 2022-12-20 NOTE — Telephone Encounter (Signed)
Patient is scheduled for removal antibiotic spacer left shoulder on 01-12-23 at  [facility].  Please fax order to  hold ASA (and for how long) to [facility].  Attention:  SYLVIA  Fax:  [phone].  If any questions call and ask for Sunday Spillers [phone].

## 2022-12-20 NOTE — Telephone Encounter (Signed)
Faxed to provided number  

## 2022-12-22 DIAGNOSIS — I25708 Atherosclerosis of coronary artery bypass graft(s), unspecified, with other forms of angina pectoris: Secondary | ICD-10-CM | POA: Diagnosis not present

## 2022-12-22 DIAGNOSIS — E039 Hypothyroidism, unspecified: Secondary | ICD-10-CM | POA: Diagnosis not present

## 2022-12-22 DIAGNOSIS — M6281 Muscle weakness (generalized): Secondary | ICD-10-CM | POA: Diagnosis not present

## 2022-12-22 DIAGNOSIS — I10 Essential (primary) hypertension: Secondary | ICD-10-CM | POA: Diagnosis not present

## 2022-12-22 DIAGNOSIS — I483 Typical atrial flutter: Secondary | ICD-10-CM | POA: Diagnosis not present

## 2022-12-22 DIAGNOSIS — Z0001 Encounter for general adult medical examination with abnormal findings: Secondary | ICD-10-CM | POA: Diagnosis not present

## 2022-12-26 DIAGNOSIS — M6281 Muscle weakness (generalized): Secondary | ICD-10-CM | POA: Diagnosis not present

## 2022-12-28 DIAGNOSIS — M6281 Muscle weakness (generalized): Secondary | ICD-10-CM | POA: Diagnosis not present

## 2023-01-02 DIAGNOSIS — M6281 Muscle weakness (generalized): Secondary | ICD-10-CM | POA: Diagnosis not present

## 2023-01-04 ENCOUNTER — Ambulatory Visit: Payer: Medicare Other | Admitting: Orthopedic Surgery

## 2023-01-04 DIAGNOSIS — M6281 Muscle weakness (generalized): Secondary | ICD-10-CM | POA: Diagnosis not present

## 2023-01-09 DIAGNOSIS — M6281 Muscle weakness (generalized): Secondary | ICD-10-CM | POA: Diagnosis not present

## 2023-01-10 DIAGNOSIS — M6281 Muscle weakness (generalized): Secondary | ICD-10-CM | POA: Diagnosis not present

## 2023-01-11 ENCOUNTER — Other Ambulatory Visit: Payer: Self-pay

## 2023-01-11 ENCOUNTER — Encounter (HOSPITAL_COMMUNITY): Payer: Self-pay | Admitting: Orthopedic Surgery

## 2023-01-11 DIAGNOSIS — M25572 Pain in left ankle and joints of left foot: Secondary | ICD-10-CM | POA: Diagnosis not present

## 2023-01-11 DIAGNOSIS — M47817 Spondylosis without myelopathy or radiculopathy, lumbosacral region: Secondary | ICD-10-CM | POA: Diagnosis not present

## 2023-01-11 DIAGNOSIS — M62830 Muscle spasm of back: Secondary | ICD-10-CM | POA: Diagnosis not present

## 2023-01-11 DIAGNOSIS — G5601 Carpal tunnel syndrome, right upper limb: Secondary | ICD-10-CM | POA: Diagnosis not present

## 2023-01-11 DIAGNOSIS — G894 Chronic pain syndrome: Secondary | ICD-10-CM | POA: Diagnosis not present

## 2023-01-11 NOTE — Anesthesia Preprocedure Evaluation (Signed)
Anesthesia Evaluation  Patient identified by MRN, date of birth, ID band Patient awake    Reviewed: Allergy & Precautions, NPO status , Patient's Chart, lab work & pertinent test results  History of Anesthesia Complications Negative for: history of anesthetic complications  Airway Mallampati: I  TM Distance: >3 FB Neck ROM: Full   Comment: Previous grade I view with [name] 4, easy mask Dental  (+) Edentulous Upper, Edentulous Lower   Pulmonary neg shortness of breath, neg sleep apnea, neg COPD, neg recent URI, PE   Pulmonary exam normal breath sounds clear to auscultation       Cardiovascular hypertension (enalapril, bumetanide, spironolactone), Pt. on medications (-) angina + CAD and + CABG (1998)  CHF: EF 35-40%.+ dysrhythmias (RBBB) Atrial Fibrillation + Valvular Problems/Murmurs (TR) AI and MR  Rhythm:Regular Rate:Normal  HLD, AAA, thoracic aortic aneurysm (43 mm)  TTE 01/16/2022: IMPRESSIONS     1. Left ventricular ejection fraction, by estimation, is 35 to 40%. The  left ventricle has moderately decreased function. The left ventricle  demonstrates global hypokinesis. The left ventricular internal cavity size  was mildly dilated. There is mild  left ventricular hypertrophy. Left ventricular diastolic parameters are  indeterminate.   2. Right ventricular systolic function is normal. The right ventricular  size is normal. There is normal pulmonary artery systolic pressure.   3. Left atrial size was moderately dilated.   4. The mitral valve is normal in structure. Mild mitral valve  regurgitation. No evidence of mitral stenosis.   5. Tricuspid valve regurgitation is mild to moderate.   6. The aortic valve is tricuspid. Aortic valve regurgitation is mild.  Aortic valve sclerosis is present, with no evidence of aortic valve  stenosis.   7. Aortic dilatation noted. There is mild dilatation of the ascending  aorta, measuring 43  mm.   8. The inferior vena cava is dilated in size with >50% respiratory  variability, suggesting right atrial pressure of 8 mmHg.     Neuro/Psych  Headaches PSYCHIATRIC DISORDERS Anxiety Depression   Dementia Chronic pain  Neuromuscular disease (lumbar spinal stenosis)    GI/Hepatic ,GERD  Medicated,,(+)     substance abuse (h/o)  alcohol useS/p bariactic surgery 2006   Endo/Other  neg diabetesHypothyroidism    Renal/GU negative Renal ROS     Musculoskeletal  (+) Arthritis , Osteoarthritis,    Abdominal  (+) - obese  Peds  Hematology  (+) Blood dyscrasia (iron deficiency), anemia   Anesthesia Other Findings Gout   Reproductive/Obstetrics                             Anesthesia Physical Anesthesia Plan  ASA: 4  Anesthesia Plan: General   Post-op Pain Management: Tylenol PO (pre-op)* and Regional block*   Induction: Intravenous  PONV Risk Score and Plan: 2 and Ondansetron, Dexamethasone and Treatment may vary due to age or medical condition  Airway Management Planned: Oral ETT  Additional Equipment:   Intra-op Plan:   Post-operative Plan: Extubation in OR  Informed Consent: I have reviewed the patients History and Physical, chart, labs and discussed the procedure including the risks, benefits and alternatives for the proposed anesthesia with the patient or authorized representative who has indicated his/her understanding and acceptance.     Dental advisory given  Plan Discussed with: CRNA and Anesthesiologist  Anesthesia Plan Comments: (Discussed potential risks of nerve blocks including, but not limited to, infection, bleeding, nerve damage, seizures, pneumothorax, respiratory  depression, and potential failure of the block. Alternatives to nerve blocks discussed. All questions answered.  Risks of general anesthesia discussed including, but not limited to, sore throat, hoarse voice, chipped/damaged teeth, injury to vocal cords,  nausea and vomiting, allergic reactions, lung infection, heart attack, stroke, and death. All questions answered. )       Anesthesia Quick Evaluation

## 2023-01-11 NOTE — Progress Notes (Addendum)
Patient is a resident at [facility]. This Probation officer called the facility and the patient was able to talk with me and answer my questions. The instructions for the surgery day were faxed to [phone].  PCP -  Cardiologist - Jenkins Rouge, MD  PPM/ICD - denies  EKG - 03/23/22 Stress Test - 07/16/13 ECHO - 01/16/22 Cardiac Cath - 1998  CPAP - n/a  Fasting Blood Sugar - n/a  Blood Thinner Instructions: n/a Aspirin Instructions - hold Aspirin prior surgery Patient was instructed: As of today, STOP taking any Aspirin (unless otherwise instructed by your surgeon) Aleve, Naproxen, Ibuprofen, Motrin, Advil, Goody's, BC's, all herbal medications, fish oil, and all vitamins.  ERAS Protcol - yes, until 09:50 o'clock  COVID TEST- n/a  Anesthesia review: yes - cardiac history  Patient verbally denies any shortness of breath, fever, cough and chest pain during phone call   -------------  SDW INSTRUCTIONS given:  Your procedure is scheduled on Thursday, February 22nd, 2024.   Report to [facility] Main Entrance "A" at 09:20 A.M., and check in at the Admitting office.  Call this number if you have problems the morning of surgery:  [phone]   Remember:  Do not eat after midnight the night before your surgery  You may drink clear liquids until 09:50 the morning of your surgery.   Clear liquids allowed are: Water, Non-Citrus Juices (without pulp), Carbonated Beverages, Clear Tea, Black Coffee Only, and Gatorade    Take these medicines the morning of surgery with A SIP OF WATER:  Allopurinol, Bupropion, Buspar, Gabapentin, Synthroid, Protonix, Crestor, Morphine PRN: Tylenol, Hydroxyzine  Hold Aspirin prior surgery  As of today, STOP taking any Aspirin (unless otherwise instructed by your surgeon) Aleve, Naproxen, Ibuprofen, Motrin, Advil, Goody's, BC's, all herbal medications, fish oil, and all vitamins.    The day of surgery:                    Do not  wear jewelry,             Do not wear lotions, powders, colognes, or deodorant.            Men may shave face and neck.            Do not bring valuables to the hospital.            [facility] is not responsible for any belongings or valuables.  Do NOT Smoke (Tobacco/Vaping) 24 hours prior to your procedure If you use a CPAP at night, you may bring all equipment for your overnight stay.   Contacts, glasses, dentures or bridgework may not be worn into surgery.      For patients admitted to the hospital, discharge time will be determined by your treatment team.   Patients discharged the day of surgery will not be allowed to drive home, and someone needs to stay with them for 24 hours.    Special instructions:   [place]- Preparing For Surgery  Before surgery, you can play an important role. Because skin is not sterile, your skin needs to be as free of germs as possible. You can reduce the number of germs on your skin by washing with CHG (chlorahexidine gluconate) Soap before surgery.  CHG is an antiseptic cleaner which kills germs and bonds with the skin to continue killing germs even after washing.    Oral Hygiene is also important to reduce your risk of infection.  Remember - BRUSH  YOUR TEETH THE MORNING OF SURGERY WITH YOUR REGULAR TOOTHPASTE  Please do not use if you have an allergy to CHG or antibacterial soaps. If your skin becomes reddened/irritated stop using the CHG.  Do not shave (including legs and underarms) for at least 48 hours prior to first CHG shower. It is OK to shave your face.  Please follow these instructions carefully.   Shower the NIGHT BEFORE SURGERY and the MORNING OF SURGERY with DIAL Soap.   Pat yourself dry with a CLEAN TOWEL.  Wear CLEAN PAJAMAS to bed the night before surgery  Place CLEAN SHEETS on your bed the night of your first shower and DO NOT SLEEP WITH PETS.   Day of Surgery: Please shower morning of surgery  Wear Clean/Comfortable  clothing the morning of surgery Do not apply any deodorants/lotions.   Remember to brush your teeth WITH YOUR REGULAR TOOTHPASTE.   Questions were answered. Patient verbalized understanding of instructions.

## 2023-01-12 ENCOUNTER — Other Ambulatory Visit: Payer: Self-pay

## 2023-01-12 ENCOUNTER — Encounter (HOSPITAL_COMMUNITY)
Admission: RE | Disposition: A | Discharge status: Home Health | Payer: Self-pay | Source: Skilled Nursing Facility | Attending: Orthopedic Surgery

## 2023-01-12 ENCOUNTER — Inpatient Hospital Stay (HOSPITAL_COMMUNITY)
Admission: RE | Admit: 2023-01-12 | Discharge: 2023-01-13 | DRG: 465 | Disposition: A | Discharge status: Home Health | Payer: Medicare Other | Source: Skilled Nursing Facility | Attending: Orthopedic Surgery | Admitting: Orthopedic Surgery

## 2023-01-12 ENCOUNTER — Inpatient Hospital Stay (HOSPITAL_COMMUNITY): Payer: Medicare Other | Admitting: Physician Assistant

## 2023-01-12 ENCOUNTER — Encounter (HOSPITAL_COMMUNITY): Payer: Self-pay | Admitting: Orthopedic Surgery

## 2023-01-12 DIAGNOSIS — T8459XA Infection and inflammatory reaction due to other internal joint prosthesis, initial encounter: Secondary | ICD-10-CM

## 2023-01-12 DIAGNOSIS — Z9049 Acquired absence of other specified parts of digestive tract: Secondary | ICD-10-CM | POA: Diagnosis not present

## 2023-01-12 DIAGNOSIS — F419 Anxiety disorder, unspecified: Secondary | ICD-10-CM | POA: Diagnosis present

## 2023-01-12 DIAGNOSIS — Z7982 Long term (current) use of aspirin: Secondary | ICD-10-CM | POA: Diagnosis not present

## 2023-01-12 DIAGNOSIS — I509 Heart failure, unspecified: Secondary | ICD-10-CM | POA: Diagnosis not present

## 2023-01-12 DIAGNOSIS — Z96653 Presence of artificial knee joint, bilateral: Secondary | ICD-10-CM | POA: Diagnosis not present

## 2023-01-12 DIAGNOSIS — F418 Other specified anxiety disorders: Secondary | ICD-10-CM

## 2023-01-12 DIAGNOSIS — Z8249 Family history of ischemic heart disease and other diseases of the circulatory system: Secondary | ICD-10-CM

## 2023-01-12 DIAGNOSIS — Z951 Presence of aortocoronary bypass graft: Secondary | ICD-10-CM | POA: Diagnosis not present

## 2023-01-12 DIAGNOSIS — M19012 Primary osteoarthritis, left shoulder: Secondary | ICD-10-CM | POA: Diagnosis not present

## 2023-01-12 DIAGNOSIS — E7800 Pure hypercholesterolemia, unspecified: Secondary | ICD-10-CM | POA: Diagnosis not present

## 2023-01-12 DIAGNOSIS — Z79899 Other long term (current) drug therapy: Secondary | ICD-10-CM

## 2023-01-12 DIAGNOSIS — Z79890 Hormone replacement therapy (postmenopausal): Secondary | ICD-10-CM | POA: Diagnosis not present

## 2023-01-12 DIAGNOSIS — I110 Hypertensive heart disease with heart failure: Secondary | ICD-10-CM | POA: Diagnosis not present

## 2023-01-12 DIAGNOSIS — Z22322 Carrier or suspected carrier of Methicillin resistant Staphylococcus aureus: Secondary | ICD-10-CM

## 2023-01-12 DIAGNOSIS — I2510 Atherosclerotic heart disease of native coronary artery without angina pectoris: Secondary | ICD-10-CM | POA: Diagnosis not present

## 2023-01-12 DIAGNOSIS — T8459XD Infection and inflammatory reaction due to other internal joint prosthesis, subsequent encounter: Secondary | ICD-10-CM | POA: Diagnosis not present

## 2023-01-12 DIAGNOSIS — D638 Anemia in other chronic diseases classified elsewhere: Secondary | ICD-10-CM | POA: Diagnosis not present

## 2023-01-12 DIAGNOSIS — K219 Gastro-esophageal reflux disease without esophagitis: Secondary | ICD-10-CM | POA: Diagnosis not present

## 2023-01-12 DIAGNOSIS — F32A Depression, unspecified: Secondary | ICD-10-CM | POA: Diagnosis not present

## 2023-01-12 DIAGNOSIS — E039 Hypothyroidism, unspecified: Secondary | ICD-10-CM | POA: Diagnosis not present

## 2023-01-12 DIAGNOSIS — M25412 Effusion, left shoulder: Secondary | ICD-10-CM | POA: Diagnosis not present

## 2023-01-12 DIAGNOSIS — Z79891 Long term (current) use of opiate analgesic: Secondary | ICD-10-CM

## 2023-01-12 DIAGNOSIS — I4510 Unspecified right bundle-branch block: Secondary | ICD-10-CM | POA: Diagnosis present

## 2023-01-12 DIAGNOSIS — Z885 Allergy status to narcotic agent status: Secondary | ICD-10-CM | POA: Diagnosis not present

## 2023-01-12 DIAGNOSIS — M19019 Primary osteoarthritis, unspecified shoulder: Secondary | ICD-10-CM | POA: Diagnosis present

## 2023-01-12 DIAGNOSIS — I10 Essential (primary) hypertension: Secondary | ICD-10-CM | POA: Diagnosis not present

## 2023-01-12 DIAGNOSIS — Z96619 Presence of unspecified artificial shoulder joint: Secondary | ICD-10-CM | POA: Diagnosis not present

## 2023-01-12 DIAGNOSIS — M109 Gout, unspecified: Secondary | ICD-10-CM | POA: Diagnosis not present

## 2023-01-12 DIAGNOSIS — Z01818 Encounter for other preprocedural examination: Principal | ICD-10-CM

## 2023-01-12 DIAGNOSIS — Z472 Encounter for removal of internal fixation device: Secondary | ICD-10-CM | POA: Diagnosis not present

## 2023-01-12 DIAGNOSIS — G8918 Other acute postprocedural pain: Secondary | ICD-10-CM | POA: Diagnosis not present

## 2023-01-12 DIAGNOSIS — Z9884 Bariatric surgery status: Secondary | ICD-10-CM | POA: Diagnosis not present

## 2023-01-12 HISTORY — PX: EXCISIONAL TOTAL SHOULDER ARTHROPLASTY WITH ANTIBIOTIC SPACER: SHX6264

## 2023-01-12 LAB — BASIC METABOLIC PANEL
Anion gap: 13 (ref 5–15)
BUN: 21 mg/dL (ref 8–23)
CO2: 23 mmol/L (ref 22–32)
Calcium: 9.2 mg/dL (ref 8.9–10.3)
Chloride: 101 mmol/L (ref 98–111)
Creatinine, Ser: 1.12 mg/dL (ref 0.61–1.24)
GFR, Estimated: >60 mL/min (ref >60)
Glucose, Bld: 96 mg/dL (ref 70–99)
Potassium: 4 mmol/L (ref 3.5–5.1)
Sodium: 137 mmol/L (ref 135–145)

## 2023-01-12 LAB — CBC
HCT: 36.1 % — ABNORMAL LOW (ref 39.0–52.0)
Hemoglobin: 11.8 g/dL — ABNORMAL LOW (ref 13.0–17.0)
MCH: 30.5 pg (ref 26.0–34.0)
MCHC: 32.7 g/dL (ref 30.0–36.0)
MCV: 93.3 fL (ref 80.0–100.0)
Platelets: 249 10*3/uL (ref 150–400)
RBC: 3.87 MIL/uL — ABNORMAL LOW (ref 4.22–5.81)
RDW: 13.5 % (ref 11.5–15.5)
WBC: 6.6 10*3/uL (ref 4.0–10.5)
nRBC: 0 % (ref 0.0–0.2)

## 2023-01-12 LAB — SURGICAL PCR SCREEN
MRSA, PCR: POSITIVE — AB
Staphylococcus aureus: POSITIVE — AB

## 2023-01-12 SURGERY — REMOVAL, HARDWARE, SHOULDER, WITH IRRIGATION, DEBRIDEMENT, AND INSERTION OF ANTIBIOTIC BEADS OR ANTIBIOTIC SPACER
Anesthesia: General | Site: Shoulder | Laterality: Left

## 2023-01-12 MED ORDER — BUPIVACAINE HCL (PF) 0.5 % IJ SOLN
INTRAMUSCULAR | Status: AC
  Filled 2023-01-12: qty 30

## 2023-01-12 MED ORDER — IRRISEPT - 450ML BOTTLE WITH 0.05% CHG IN STERILE WATER, USP 99.95% OPTIME
TOPICAL | Status: DC | PRN
  Administered 2023-01-12: 450 mL via TOPICAL

## 2023-01-12 MED ORDER — HYDROXYZINE HCL 10 MG PO TABS
10.0000 mg | ORAL_TABLET | ORAL | Status: DC | PRN
  Administered 2023-01-13: 10 mg via ORAL
  Filled 2023-01-12: qty 1

## 2023-01-12 MED ORDER — ONDANSETRON HCL 4 MG/2ML IJ SOLN
INTRAMUSCULAR | Status: DC | PRN
  Administered 2023-01-12: 4 mg via INTRAVENOUS

## 2023-01-12 MED ORDER — GABAPENTIN 300 MG PO CAPS
300.0000 mg | ORAL_CAPSULE | Freq: Three times a day (TID) | ORAL | Status: DC
  Administered 2023-01-12 – 2023-01-13 (×3): 300 mg via ORAL
  Filled 2023-01-12 (×4): qty 1

## 2023-01-12 MED ORDER — LIDOCAINE 2% (20 MG/ML) 5 ML SYRINGE
INTRAMUSCULAR | Status: AC
  Filled 2023-01-12: qty 5

## 2023-01-12 MED ORDER — FERROUS SULFATE 325 (65 FE) MG PO TABS: 325.0000 mg | ORAL_TABLET | ORAL | Status: DC

## 2023-01-12 MED ORDER — METOCLOPRAMIDE HCL 5 MG/ML IJ SOLN: 5.0000 mg | Freq: Three times a day (TID) | INTRAMUSCULAR | Status: DC | PRN

## 2023-01-12 MED ORDER — ALLOPURINOL 100 MG PO TABS
100.0000 mg | ORAL_TABLET | Freq: Every day | ORAL | Status: DC
  Administered 2023-01-13: 100 mg via ORAL
  Filled 2023-01-12 (×2): qty 1

## 2023-01-12 MED ORDER — PHENOL 1.4 % MT LIQD: 1.0000 | OROMUCOSAL | Status: DC | PRN

## 2023-01-12 MED ORDER — TRANEXAMIC ACID-NACL 1000-0.7 MG/100ML-% IV SOLN
1000.0000 mg | INTRAVENOUS | Status: AC
  Administered 2023-01-12: 1000 mg via INTRAVENOUS
  Filled 2023-01-12: qty 100

## 2023-01-12 MED ORDER — BUSPIRONE HCL 5 MG PO TABS
15.0000 mg | ORAL_TABLET | Freq: Three times a day (TID) | ORAL | Status: DC
  Administered 2023-01-12 – 2023-01-13 (×3): 15 mg via ORAL
  Filled 2023-01-12 (×4): qty 1

## 2023-01-12 MED ORDER — VITAMIN D 25 MCG (1000 UNIT) PO TABS
1000.0000 [IU] | ORAL_TABLET | Freq: Every day | ORAL | Status: DC
  Administered 2023-01-13: 1000 [IU] via ORAL
  Filled 2023-01-12: qty 1

## 2023-01-12 MED ORDER — ONDANSETRON HCL 4 MG/2ML IJ SOLN: 4.0000 mg | Freq: Four times a day (QID) | INTRAMUSCULAR | Status: DC | PRN

## 2023-01-12 MED ORDER — POVIDONE-IODINE 7.5 % EX SOLN
Freq: Once | CUTANEOUS | Status: DC
  Filled 2023-01-12: qty 118

## 2023-01-12 MED ORDER — 0.9 % SODIUM CHLORIDE (POUR BTL) OPTIME
TOPICAL | Status: DC | PRN
  Administered 2023-01-12: 1000 mL

## 2023-01-12 MED ORDER — POVIDONE-IODINE 10 % EX SWAB
2.0000 | Freq: Once | CUTANEOUS | Status: AC
  Administered 2023-01-12: 2 via TOPICAL

## 2023-01-12 MED ORDER — EPHEDRINE SULFATE-NACL 50-0.9 MG/10ML-% IV SOSY
PREFILLED_SYRINGE | INTRAVENOUS | Status: DC | PRN
  Administered 2023-01-12: 10 mg via INTRAVENOUS
  Administered 2023-01-12: 5 mg via INTRAVENOUS

## 2023-01-12 MED ORDER — CEFAZOLIN SODIUM-DEXTROSE 1-4 GM/50ML-% IV SOLN
1.0000 g | Freq: Four times a day (QID) | INTRAVENOUS | Status: AC
  Administered 2023-01-12 – 2023-01-13 (×3): 1 g via INTRAVENOUS
  Filled 2023-01-12 (×3): qty 50

## 2023-01-12 MED ORDER — FENTANYL CITRATE (PF) 100 MCG/2ML IJ SOLN: 25.0000 ug | INTRAMUSCULAR | Status: DC | PRN

## 2023-01-12 MED ORDER — MENTHOL 3 MG MT LOZG: 1.0000 | LOZENGE | OROMUCOSAL | Status: DC | PRN

## 2023-01-12 MED ORDER — VANCOMYCIN HCL 1000 MG IV SOLR
INTRAVENOUS | Status: AC
  Filled 2023-01-12: qty 20

## 2023-01-12 MED ORDER — OXYCODONE HCL 5 MG/5ML PO SOLN: 5.0000 mg | Freq: Once | ORAL | Status: DC | PRN

## 2023-01-12 MED ORDER — PHENYLEPHRINE HCL-NACL 20-0.9 MG/250ML-% IV SOLN
INTRAVENOUS | Status: DC | PRN
  Administered 2023-01-12: 25 ug/min via INTRAVENOUS

## 2023-01-12 MED ORDER — PROPOFOL 10 MG/ML IV BOLUS
INTRAVENOUS | Status: DC | PRN
  Administered 2023-01-12: 100 mg via INTRAVENOUS

## 2023-01-12 MED ORDER — CEFAZOLIN SODIUM-DEXTROSE 2-4 GM/100ML-% IV SOLN
2.0000 g | INTRAVENOUS | Status: AC
  Administered 2023-01-12: 2 g via INTRAVENOUS
  Filled 2023-01-12: qty 100

## 2023-01-12 MED ORDER — ONDANSETRON HCL 4 MG PO TABS: 4.0000 mg | ORAL_TABLET | Freq: Four times a day (QID) | ORAL | Status: DC | PRN

## 2023-01-12 MED ORDER — MELATONIN 5 MG PO TABS
5.0000 mg | ORAL_TABLET | Freq: Every day | ORAL | Status: DC
  Administered 2023-01-12: 5 mg via ORAL
  Filled 2023-01-12: qty 1

## 2023-01-12 MED ORDER — VANCOMYCIN HCL 1000 MG IV SOLR
INTRAVENOUS | Status: DC | PRN
  Administered 2023-01-12: 1000 mg via TOPICAL

## 2023-01-12 MED ORDER — FENTANYL CITRATE (PF) 100 MCG/2ML IJ SOLN
INTRAMUSCULAR | Status: AC
  Administered 2023-01-12: 50 ug via INTRAVENOUS
  Filled 2023-01-12: qty 2

## 2023-01-12 MED ORDER — ASPIRIN 81 MG PO TBEC
81.0000 mg | DELAYED_RELEASE_TABLET | Freq: Every day | ORAL | Status: DC
  Administered 2023-01-12 – 2023-01-13 (×2): 81 mg via ORAL
  Filled 2023-01-12 (×3): qty 1

## 2023-01-12 MED ORDER — EPHEDRINE 5 MG/ML INJ
INTRAVENOUS | Status: AC
  Filled 2023-01-12: qty 5

## 2023-01-12 MED ORDER — LIDOCAINE 2% (20 MG/ML) 5 ML SYRINGE
INTRAMUSCULAR | Status: DC | PRN
  Administered 2023-01-12: 100 mg via INTRAVENOUS

## 2023-01-12 MED ORDER — BUPROPION HCL ER (XL) 150 MG PO TB24
300.0000 mg | ORAL_TABLET | Freq: Every morning | ORAL | Status: DC
  Administered 2023-01-13: 300 mg via ORAL
  Filled 2023-01-12: qty 1
  Filled 2023-01-12: qty 2

## 2023-01-12 MED ORDER — DOCUSATE SODIUM 100 MG PO CAPS
100.0000 mg | ORAL_CAPSULE | Freq: Two times a day (BID) | ORAL | Status: DC
  Administered 2023-01-12 – 2023-01-13 (×2): 100 mg via ORAL
  Filled 2023-01-12 (×2): qty 1

## 2023-01-12 MED ORDER — ZOLPIDEM TARTRATE 5 MG PO TABS
10.0000 mg | ORAL_TABLET | Freq: Every day | ORAL | Status: DC
  Administered 2023-01-12: 10 mg via ORAL
  Filled 2023-01-12: qty 2

## 2023-01-12 MED ORDER — BUPIVACAINE HCL (PF) 0.5 % IJ SOLN
INTRAMUSCULAR | Status: DC | PRN
  Administered 2023-01-12: 10 mL via PERINEURAL

## 2023-01-12 MED ORDER — ACETAMINOPHEN 500 MG PO TABS
1000.0000 mg | ORAL_TABLET | Freq: Once | ORAL | Status: AC
  Administered 2023-01-12: 1000 mg via ORAL
  Filled 2023-01-12: qty 2

## 2023-01-12 MED ORDER — PHENYLEPHRINE HCL (PRESSORS) 10 MG/ML IV SOLN
INTRAVENOUS | Status: AC
  Filled 2023-01-12: qty 1

## 2023-01-12 MED ORDER — ADULT MULTIVITAMIN W/MINERALS CH
1.0000 | ORAL_TABLET | Freq: Two times a day (BID) | ORAL | Status: DC
  Administered 2023-01-12 – 2023-01-13 (×2): 1 via ORAL
  Filled 2023-01-12 (×2): qty 1

## 2023-01-12 MED ORDER — SUGAMMADEX SODIUM 200 MG/2ML IV SOLN
INTRAVENOUS | Status: DC | PRN
  Administered 2023-01-12: 200 mg via INTRAVENOUS

## 2023-01-12 MED ORDER — FENTANYL CITRATE (PF) 100 MCG/2ML IJ SOLN
INTRAMUSCULAR | Status: DC | PRN
  Administered 2023-01-12: 50 ug via INTRAVENOUS

## 2023-01-12 MED ORDER — ENALAPRIL MALEATE 2.5 MG PO TABS
2.5000 mg | ORAL_TABLET | Freq: Every day | ORAL | Status: DC
  Administered 2023-01-13: 2.5 mg via ORAL
  Filled 2023-01-12: qty 1

## 2023-01-12 MED ORDER — AMISULPRIDE (ANTIEMETIC) 5 MG/2ML IV SOLN: 10.0000 mg | Freq: Once | INTRAVENOUS | Status: DC | PRN

## 2023-01-12 MED ORDER — LEVOTHYROXINE SODIUM 25 MCG PO TABS
25.0000 ug | ORAL_TABLET | Freq: Every day | ORAL | Status: DC
  Administered 2023-01-13: 25 ug via ORAL
  Filled 2023-01-12: qty 1

## 2023-01-12 MED ORDER — CHLORHEXIDINE GLUCONATE 0.12 % MT SOLN
OROMUCOSAL | Status: AC
  Administered 2023-01-12: 15 mL
  Filled 2023-01-12: qty 15

## 2023-01-12 MED ORDER — LACTATED RINGERS IV SOLN: INTRAVENOUS | Status: DC | PRN

## 2023-01-12 MED ORDER — ACETAMINOPHEN 325 MG PO TABS: 325.0000 mg | ORAL_TABLET | Freq: Four times a day (QID) | ORAL | Status: DC | PRN

## 2023-01-12 MED ORDER — PROPOFOL 10 MG/ML IV BOLUS
INTRAVENOUS | Status: AC
  Filled 2023-01-12: qty 20

## 2023-01-12 MED ORDER — OXYCODONE HCL 5 MG PO TABS: 5.0000 mg | ORAL_TABLET | Freq: Once | ORAL | Status: DC | PRN

## 2023-01-12 MED ORDER — MUPIROCIN 2 % EX OINT
1.0000 | TOPICAL_OINTMENT | Freq: Two times a day (BID) | CUTANEOUS | Status: DC
  Administered 2023-01-12 – 2023-01-13 (×2): 1 via NASAL
  Filled 2023-01-12 (×2): qty 22

## 2023-01-12 MED ORDER — ROCURONIUM BROMIDE 10 MG/ML (PF) SYRINGE
PREFILLED_SYRINGE | INTRAVENOUS | Status: DC | PRN
  Administered 2023-01-12: 40 mg via INTRAVENOUS

## 2023-01-12 MED ORDER — DOCUSATE SODIUM 100 MG PO CAPS: 100.0000 mg | ORAL_CAPSULE | Freq: Two times a day (BID) | ORAL | Status: DC

## 2023-01-12 MED ORDER — HYDROXYZINE HCL 25 MG PO TABS: 25.0000 mg | ORAL_TABLET | Freq: Three times a day (TID) | ORAL | Status: DC | PRN

## 2023-01-12 MED ORDER — METHOCARBAMOL 500 MG PO TABS
500.0000 mg | ORAL_TABLET | Freq: Four times a day (QID) | ORAL | Status: DC | PRN
  Administered 2023-01-13: 500 mg via ORAL
  Filled 2023-01-12: qty 1

## 2023-01-12 MED ORDER — FENTANYL CITRATE (PF) 100 MCG/2ML IJ SOLN: 50.0000 ug | Freq: Once | INTRAMUSCULAR | Status: AC

## 2023-01-12 MED ORDER — FENTANYL CITRATE (PF) 250 MCG/5ML IJ SOLN
INTRAMUSCULAR | Status: AC
  Filled 2023-01-12: qty 5

## 2023-01-12 MED ORDER — MORPHINE SULFATE ER 15 MG PO TBCR
15.0000 mg | EXTENDED_RELEASE_TABLET | Freq: Three times a day (TID) | ORAL | Status: DC
  Administered 2023-01-12 – 2023-01-13 (×3): 15 mg via ORAL
  Filled 2023-01-12 (×3): qty 1

## 2023-01-12 MED ORDER — ROCURONIUM BROMIDE 10 MG/ML (PF) SYRINGE
PREFILLED_SYRINGE | INTRAVENOUS | Status: AC
  Filled 2023-01-12: qty 10

## 2023-01-12 MED ORDER — CHLORHEXIDINE GLUCONATE CLOTH 2 % EX PADS
6.0000 | MEDICATED_PAD | Freq: Every day | CUTANEOUS | Status: DC
  Administered 2023-01-13: 6 via TOPICAL

## 2023-01-12 MED ORDER — SPIRONOLACTONE 12.5 MG HALF TABLET
12.5000 mg | ORAL_TABLET | Freq: Every day | ORAL | Status: DC
  Administered 2023-01-13: 12.5 mg via ORAL
  Filled 2023-01-12: qty 1

## 2023-01-12 MED ORDER — DEXAMETHASONE SODIUM PHOSPHATE 10 MG/ML IJ SOLN
INTRAMUSCULAR | Status: DC | PRN
  Administered 2023-01-12: 8 mg via INTRAVENOUS

## 2023-01-12 MED ORDER — METOCLOPRAMIDE HCL 5 MG PO TABS: 5.0000 mg | ORAL_TABLET | Freq: Three times a day (TID) | ORAL | Status: DC | PRN

## 2023-01-12 MED ORDER — METHOCARBAMOL 1000 MG/10ML IJ SOLN: 500.0000 mg | Freq: Four times a day (QID) | INTRAVENOUS | Status: DC | PRN

## 2023-01-12 MED ORDER — OXYCODONE HCL 5 MG PO TABS
5.0000 mg | ORAL_TABLET | Freq: Four times a day (QID) | ORAL | Status: DC | PRN
  Administered 2023-01-13 (×2): 5 mg via ORAL
  Filled 2023-01-12 (×2): qty 1

## 2023-01-12 MED ORDER — BUMETANIDE 1 MG PO TABS
1.0000 mg | ORAL_TABLET | Freq: Every day | ORAL | Status: DC
  Administered 2023-01-13: 1 mg via ORAL
  Filled 2023-01-12 (×2): qty 1

## 2023-01-12 MED ORDER — ACETAMINOPHEN 325 MG PO TABS: 650.0000 mg | ORAL_TABLET | Freq: Four times a day (QID) | ORAL | Status: DC | PRN

## 2023-01-12 MED ORDER — SODIUM CHLORIDE 0.9 % IR SOLN
Status: DC | PRN
  Administered 2023-01-12: 3000 mL

## 2023-01-12 MED ORDER — PANTOPRAZOLE SODIUM 40 MG PO TBEC
40.0000 mg | DELAYED_RELEASE_TABLET | Freq: Two times a day (BID) | ORAL | Status: DC
  Administered 2023-01-12 – 2023-01-13 (×2): 40 mg via ORAL
  Filled 2023-01-12 (×2): qty 1

## 2023-01-12 MED ORDER — ROSUVASTATIN CALCIUM 20 MG PO TABS
40.0000 mg | ORAL_TABLET | Freq: Every day | ORAL | Status: DC
  Administered 2023-01-13: 40 mg via ORAL
  Filled 2023-01-12: qty 2

## 2023-01-12 SURGICAL SUPPLY — 78 items
AID PSTN UNV HD RSTRNT DISP (MISCELLANEOUS) ×1
ALCOHOL 70% 16 OZ (MISCELLANEOUS) ×1 IMPLANT
APL PRP STRL LF DISP 70% ISPRP (MISCELLANEOUS) ×2
BAG COUNTER SPONGE SURGICOUNT (BAG) ×1 IMPLANT
BAG SPNG CNTER NS LX DISP (BAG) ×1
BLADE SAW SGTL 13X75X1.27 (BLADE) ×1 IMPLANT
CANISTER WOUND CARE 500ML ATS (WOUND CARE) IMPLANT
CHLORAPREP W/TINT 26 (MISCELLANEOUS) ×2 IMPLANT
COOLER ICEMAN CLASSIC (MISCELLANEOUS) ×1 IMPLANT
COVER SURGICAL LIGHT HANDLE (MISCELLANEOUS) ×1 IMPLANT
DRAPE INCISE IOBAN 66X45 STRL (DRAPES) ×1 IMPLANT
DRAPE U-SHAPE 47X51 STRL (DRAPES) ×2 IMPLANT
DRESSING PEEL AND PLC PRVNA 13 (GAUZE/BANDAGES/DRESSINGS) IMPLANT
DRSG AQUACEL AG ADV 3.5X10 (GAUZE/BANDAGES/DRESSINGS) ×1 IMPLANT
DRSG PEEL AND PLACE PREVENA 13 (GAUZE/BANDAGES/DRESSINGS) ×1
ELECT BLADE 4.0 EZ CLEAN MEGAD (MISCELLANEOUS)
ELECT REM PT RETURN 9FT ADLT (ELECTROSURGICAL) ×1
ELECTRODE BLDE 4.0 EZ CLN MEGD (MISCELLANEOUS) IMPLANT
ELECTRODE REM PT RTRN 9FT ADLT (ELECTROSURGICAL) ×1 IMPLANT
GAUZE SPONGE 4X4 12PLY STRL LF (GAUZE/BANDAGES/DRESSINGS) ×1 IMPLANT
GLOVE BIOGEL PI IND STRL 7.0 (GLOVE) ×1 IMPLANT
GLOVE BIOGEL PI IND STRL 8 (GLOVE) ×1 IMPLANT
GLOVE ECLIPSE 7.0 STRL STRAW (GLOVE) ×1 IMPLANT
GLOVE ECLIPSE 8.0 STRL XLNG CF (GLOVE) ×1 IMPLANT
GOWN STRL REUS W/ TWL LRG LVL3 (GOWN DISPOSABLE) ×2 IMPLANT
GOWN STRL REUS W/ TWL XL LVL3 (GOWN DISPOSABLE) ×1 IMPLANT
GOWN STRL REUS W/TWL LRG LVL3 (GOWN DISPOSABLE) ×2
GOWN STRL REUS W/TWL XL LVL3 (GOWN DISPOSABLE) ×1
HANDPIECE INTERPULSE COAX TIP (DISPOSABLE) ×1
HYDROGEN PEROXIDE 16OZ (MISCELLANEOUS) ×1 IMPLANT
JET LAVAGE IRRISEPT WOUND (IRRIGATION / IRRIGATOR) ×1
KIT BASIN OR (CUSTOM PROCEDURE TRAY) ×1 IMPLANT
KIT DRSG PREVENA PLUS 7DAY 125 (MISCELLANEOUS) IMPLANT
KIT TURNOVER KIT B (KITS) ×1 IMPLANT
LAVAGE JET IRRISEPT WOUND (IRRIGATION / IRRIGATOR) ×1 IMPLANT
LOOP VESSEL MAXI BLUE (MISCELLANEOUS) ×1 IMPLANT
MANIFOLD NEPTUNE II (INSTRUMENTS) ×1 IMPLANT
NDL HYPO 25GX1X1/2 BEV (NEEDLE) IMPLANT
NDL SUT 6 .5 CRC .975X.05 MAYO (NEEDLE) ×1 IMPLANT
NEEDLE HYPO 25GX1X1/2 BEV (NEEDLE) IMPLANT
NEEDLE MAYO TAPER (NEEDLE) ×1
NOZZLE CEMENT SMALL (ORTHOPEDIC DISPOSABLE SUPPLIES) IMPLANT
NS IRRIG 1000ML POUR BTL (IV SOLUTION) ×1 IMPLANT
PACK SHOULDER (CUSTOM PROCEDURE TRAY) ×1 IMPLANT
PAD ARMBOARD 7.5X6 YLW CONV (MISCELLANEOUS) ×2 IMPLANT
PAD COLD SHLDR UNI WRAP-ON (PAD) ×1
PAD COLD SHLDR WRAP-ON (PAD) ×1 IMPLANT
PAD COLD UNI WRAP-ON (PAD) IMPLANT
RESTRAINT HEAD UNIVERSAL NS (MISCELLANEOUS) ×1 IMPLANT
RETRIEVER SUT HEWSON (MISCELLANEOUS) ×1 IMPLANT
SET HNDPC FAN SPRY TIP SCT (DISPOSABLE) IMPLANT
SLING ARM IMMOBILIZER LRG (SOFTGOODS) ×1 IMPLANT
SOL PREP POV-IOD 4OZ 10% (MISCELLANEOUS) ×1 IMPLANT
SPONGE T-LAP 18X18 ~~LOC~~+RFID (SPONGE) ×1 IMPLANT
STRIP CLOSURE SKIN 1/2X4 (GAUZE/BANDAGES/DRESSINGS) ×1 IMPLANT
SUCTION FRAZIER HANDLE 10FR (MISCELLANEOUS) ×1
SUCTION TUBE FRAZIER 10FR DISP (MISCELLANEOUS) ×1 IMPLANT
SUT ETHILON 3 0 PS 1 (SUTURE) IMPLANT
SUT FIBERWIRE #2 38 T-5 BLUE (SUTURE)
SUT MAXBRAID (SUTURE) IMPLANT
SUT MNCRL AB 3-0 PS2 18 (SUTURE) ×1 IMPLANT
SUT SILK 2 0 TIES 10X30 (SUTURE) ×1 IMPLANT
SUT VIC AB 0 CT1 27 (SUTURE) ×2
SUT VIC AB 0 CT1 27XBRD ANBCTR (SUTURE) ×2 IMPLANT
SUT VIC AB 1 CT1 27 (SUTURE) ×4
SUT VIC AB 1 CT1 27XBRD ANBCTR (SUTURE) ×1 IMPLANT
SUT VIC AB 2-0 CT1 27 (SUTURE) ×1
SUT VIC AB 2-0 CT1 TAPERPNT 27 (SUTURE) ×2 IMPLANT
SUT VICRYL 0 UR6 27IN ABS (SUTURE) ×2 IMPLANT
SUTURE FIBERWR #2 38 T-5 BLUE (SUTURE) IMPLANT
SUTURE TAPE 1.3 40 TPR END (SUTURE) IMPLANT
SUTURETAPE 1.3 40 TPR END (SUTURE)
SYR CONTROL 10ML LL (SYRINGE) IMPLANT
TOWEL GREEN STERILE (TOWEL DISPOSABLE) ×1 IMPLANT
TOWEL GREEN STERILE FF (TOWEL DISPOSABLE) ×1 IMPLANT
TRAY FOL W/BAG SLVR 16FR STRL (SET/KITS/TRAYS/PACK) IMPLANT
TRAY FOLEY W/BAG SLVR 16FR LF (SET/KITS/TRAYS/PACK)
WATER STERILE IRR 1000ML POUR (IV SOLUTION) ×1 IMPLANT

## 2023-01-12 NOTE — Anesthesia Procedure Notes (Signed)
Procedure Name: Intubation Date/Time: 01/12/2023 12:14 PM  Performed by: Niel Hummer, CRNAPre-anesthesia Checklist: Patient identified, Emergency Drugs available, Suction available and Patient being monitored Patient Re-evaluated:Patient Re-evaluated prior to induction Oxygen Delivery Method: Circle system utilized Preoxygenation: Pre-oxygenation with 100% oxygen Induction Type: IV induction Ventilation: Mask ventilation without difficulty Laryngoscope Size: [name] and 4 Grade View: Grade I Tube type: Oral Tube size: 7.5 mm Number of attempts: 1 Airway Equipment and Method: Stylet Placement Confirmation: ETT inserted through vocal cords under direct vision, positive ETCO2 and breath sounds checked- equal and bilateral Secured at: 23 cm Tube secured with: Tape Dental Injury: Teeth and Oropharynx as per pre-operative assessment  Comments: Intubation done by Paramedic student Teressa Senter. Supervised entire time. Grade 1 view, easy mask.

## 2023-01-12 NOTE — Anesthesia Postprocedure Evaluation (Signed)
Anesthesia Post Note  Patient: Jeremiah [name]  Procedure(s) Performed: LEFT SHOULDER REMOVAL OF ANTIBIOTIC SPACER (Left: Shoulder)     Patient location during evaluation: PACU Anesthesia Type: General and Regional Level of consciousness: awake Pain management: pain level controlled Vital Signs Assessment: post-procedure vital signs reviewed and stable Respiratory status: spontaneous breathing, nonlabored ventilation and respiratory function stable Cardiovascular status: blood pressure returned to baseline and stable Postop Assessment: no apparent nausea or vomiting Anesthetic complications: no   No notable events documented.  Last Vitals:  Vitals:   01/12/23 1426 01/12/23 1441  BP: 123/70 129/68  Pulse: (!) 57 (!) 54  Resp: 16 14  Temp:  36.6 C  SpO2: 97% 96%    Last Pain:  Vitals:   01/12/23 1341  TempSrc:   PainSc: 0-No pain                 Nilda Simmer

## 2023-01-12 NOTE — Brief Op Note (Signed)
   01/12/2023  2:03 PM  PATIENT:  Jeremiah [name]  72 y.o. male  PRE-OPERATIVE DIAGNOSIS:  left shoulder retained hardware  POST-OPERATIVE DIAGNOSIS:  left shoulder retained hardware  PROCEDURE:  Procedure(s): LEFT SHOULDER REMOVAL OF ANTIBIOTIC SPACER  SURGEON:  Surgeon(s): Meredith Pel, MD  ASSISTANT: green rnfa  ANESTHESIA:   general  EBL: 25 ml    Total I/O In: 1000 [I.V.:800; IV Piggyback:200] Out: 25 [Blood:25]  BLOOD ADMINISTERED: none  DRAINS:  provena wound vac    LOCAL MEDICATIONS USED:  none  SPECIMEN:  No Specimen  COUNTS:  YES  TOURNIQUET:  * No tourniquets in log *  DICTATION: .Other Dictation: Dictation Number [id number]  PLAN OF CARE: Admit for overnight observation  PATIENT DISPOSITION:  PACU - hemodynamically stable

## 2023-01-12 NOTE — Anesthesia Procedure Notes (Addendum)
Anesthesia Regional Block: Interscalene brachial plexus block   Pre-Anesthetic Checklist: , timeout performed,  Correct Patient, Correct Site, Correct Laterality,  Correct Procedure, Correct Position, site marked,  Risks and benefits discussed,  Surgical consent,  Pre-op evaluation,  At surgeon's request and post-op pain management  Laterality: Left  Prep: chloraprep       Needles:  Injection technique: Single-shot  Needle Type: Echogenic Stimulator Needle     Needle Length: 9cm  Needle Gauge: 21     Additional Needles:   Procedures:,,,, ultrasound used (permanent image in chart),,    Narrative:  Start time: 01/12/2023 11:14 AM End time: 01/12/2023 11:16 AM Injection made incrementally with aspirations every 5 mL.  Performed by: Personally  Anesthesiologist: Nilda Simmer, MD  Additional Notes: Discussed risks and benefits of nerve block including, but not limited to, prolonged and/or permanent nerve injury involving sensory and/or motor function. Monitors were applied and a time-out was performed. The nerve and associated structures were visualized under ultrasound guidance. After negative aspiration, local anesthetic was slowly injected around the nerve. There was no evidence of high pressure during the procedure. There were no paresthesias. VSS remained stable and the patient tolerated the procedure well.
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
## 2023-01-12 NOTE — Transfer of Care (Signed)
Immediate Anesthesia Transfer of Care Note  Patient: Jeremiah [name]  Procedure(s) Performed: LEFT SHOULDER REMOVAL OF ANTIBIOTIC SPACER (Left: Shoulder)  Patient Location: PACU  Anesthesia Type:General  Level of Consciousness: awake, alert , and oriented  Airway & Oxygen Therapy: Patient Spontanous Breathing and Patient connected to face mask oxygen  Post-op Assessment: Report given to RN and Post -op Vital signs reviewed and stable  Post vital signs: Reviewed and stable  Last Vitals:  Vitals Value Taken Time  BP 126/61 01/12/23 1341  Temp    Pulse 55 01/12/23 1343  Resp 13 01/12/23 1343  SpO2 100 % 01/12/23 1343  Vitals shown include unvalidated device data.  Last Pain:  Vitals:   01/12/23 1000  TempSrc:   PainSc: 5       Patients Stated Pain Goal: 2 ([id number])  Complications: No notable events documented.

## 2023-01-12 NOTE — Op Note (Signed)
NAMEOLANDA, CARUFEL. MEDICAL RECORD NO: [id number] ACCOUNT NO: [id number] DATE OF BIRTH: 1951/11/02 FACILITY: [facility] LOCATION: [facility] PHYSICIAN: Yetta Barre. Marlou Sa, MD  Operative Report   DATE OF PROCEDURE: 01/12/2023  PREOPERATIVE DIAGNOSIS:  Retained antibiotic shoulder spacer, left shoulder.  POSTOPERATIVE DIAGNOSIS:  Retained antibiotic shoulder spacer, left shoulder.  PROCEDURE:  Removal of antibiotic spacer, left shoulder and excisional debridement of devitalized appearing tissue.  INDICATIONS:  This is a 72 year old patient who is about a year out from antibiotic spacer placement for infected total shoulder.  Presents for operative management after explanation of risks and benefits.  DESCRIPTION OF PROCEDURE:  The patient was brought to the operating room where general endotracheal anesthesia was induced.  Preoperative antibiotics administered.  Timeout was called.  The patient was placed in the beach chair position with head in  neutral position.  Left shoulder, arm and hand prescrubbed with alcohol and Betadine, allowed to air dry, prepped with DuraPrep solution and draped in usual sterile manner.  Ioban used to cover the operative field.  Incision was made in the prior  deltopectoral incision.  Skin and subcutaneous tissue were sharply divided.  The patient did have mildly atrophic deltoid.  Deltopectoral interval was entered.  Capsule encased the glenohumeral joint.  This capsule was incised.  Cultures were taken from  fluid.  It did not appear infected.  The glenoid wear was present.  The bones themselves were palpated and did not feel particularly osteomyelitic.  Using an osteotome circumferentially around the edge of the bone cement interface the cement spacer was  loosened.  A retrograde debriding device was used in the canal. A curette was also used circumferentially around the glenoid and the soft tissue capsular region.  Thorough irrigation was then performed with 3 liters of  irrigating solution.  A gram of  vancomycin powder was placed then the capsular incision was then closed using #1 Vicryl suture in watertight fashion.  Remaining skin was closed using interrupted inverted 0 Vicryl suture, 2-0 Vicryl suture, and 3-0 nylon with Prevena wound VAC applied.   Shoulder immobilizer placed.  The patient tolerated the procedure well without immediate complications.  Glenoid deformity was present.  The patient tolerated the procedure well without immediate complications and transferred to recovery room in stable  condition.   PUS D: 01/12/2023 2:07:37 pm T: 01/12/2023 2:26:00 pm  JOB: [id number]

## 2023-01-12 NOTE — Progress Notes (Signed)
CRITICAL RESULT PROVIDER NOTIFICATION  Test performed and critical result:  MRSA PCR positive  Date and time result received:  01/12/23 1408  Standing order set placed

## 2023-01-13 ENCOUNTER — Encounter (HOSPITAL_COMMUNITY): Payer: Self-pay | Admitting: Orthopedic Surgery

## 2023-01-13 MED ORDER — OXYCODONE HCL 5 MG PO TABS: 5.0000 mg | ORAL_TABLET | Freq: Four times a day (QID) | ORAL | 0 refills | Status: AC | PRN

## 2023-01-13 MED ORDER — METHOCARBAMOL 500 MG PO TABS: 500.0000 mg | ORAL_TABLET | Freq: Four times a day (QID) | ORAL | 0 refills | Status: AC | PRN

## 2023-01-13 NOTE — Progress Notes (Addendum)
PT Cancellation Note  Patient Details Name: Jeremiah [name] MRN: [id number] DOB: [birth year]   Cancelled Treatment:    Reason Eval/Treat Not Completed: (P) PT screened, no needs identified, will sign off. Pt appears to have been mod I for functional mobility with OT today and plans to d/c back to ALF today. No acute PT needs identified. Follow physician's recommendations for discharge plan and follow up therapies. If pt does not end up discharging today and demonstrates need for acute PT, please re-consult.   Moishe Spice, PT, DPT Acute Rehabilitation Services  Office: [phone]    Orvan Falconer 01/13/2023, 1:47 PM

## 2023-01-13 NOTE — TOC Transition Note (Signed)
Transition of Care [facility]) - CM/SW Discharge Note   Patient Details  Name: Jeremiah [name] MRN: [id number] Date of Birth: 11-15-1951  [facility]) CM/SW Contact:  Joanne Chars, LCSW Phone Number: 01/13/2023, 12:27 PM   Clinical Narrative:   Pt discharging to [facility].  [place] will provide transportation and should be here 1300.  RN call report to [phone].      Final next level of care: Assisted Living Barriers to Discharge: Barriers Resolved   Patient Goals and CMS Choice   Choice offered to / list presented to : Patient (pt wants to return to [place], current residence)  Discharge Placement                Patient chooses bed at:  [facility]) Patient to be transferred to facility by: [facility] Name of family member notified: pt reports no family to notify Patient and family notified of of transfer: 01/13/23  Discharge Plan and Services Additional resources added to the After Visit Summary for   In-house Referral: Clinical Social Work   Post Acute Care Choice: Resumption of Svcs/PTA Provider                               Social Determinants of Health (SDOH) Interventions SDOH Screenings   Food Insecurity: No Food Insecurity (01/12/2023)  Housing: Low Risk  (01/12/2023)  Transportation Needs: No Transportation Needs (01/12/2023)  Utilities: Not At Risk (01/12/2023)  Tobacco Use: Low Risk  (01/13/2023)     Readmission Risk Interventions    03/23/2022   12:11 PM 03/02/2022    1:30 PM 02/21/2022   11:53 AM  Readmission Risk Prevention Plan  Transportation Screening Complete Complete Complete  Medication Review Press photographer) Complete Complete Complete  PCP or Specialist appointment within 3-5 days of discharge  Complete Complete  HRI or Home Care Consult Complete Complete Complete  SW Recovery Care/Counseling Consult Complete Complete Complete  Palliative Care Screening Not Applicable  Not [place] Not Applicable Not Applicable Not Applicable

## 2023-01-13 NOTE — Progress Notes (Addendum)
Physician Discharge Summary      Patient ID: Jeremiah [name] MRN: [id number] DOB/AGE: [birth year] 71 y.o.  Admit date: 01/12/2023 Discharge date: 01/13/2023  Admission Diagnoses:  Principal Problem:   Shoulder effusion, left Active Problems:   Shoulder arthritis   Discharge Diagnoses:  Same  Surgeries: Procedure(s): LEFT SHOULDER REMOVAL OF ANTIBIOTIC SPACER on 01/12/2023   Consultants:   Discharged Condition: Stable  Hospital Course: Jeremiah [name] is an 72 y.o. male who was admitted 01/12/2023 with a chief complaint of retained shoulder spacer, and found to have a diagnosis of Shoulder effusion, left.  They were brought to the operating room on 01/12/2023 and underwent the above named procedures.  Did well post op. Provena incisional wound vac placed. Plan for f/u 7 days. Activity and rom as tolerated with left shoulder. Resume ms contin - oxy added for 5 days for post op pain.on asa for dvt prophylaxis Patient will need nursing and home health physical therapy to work on ambulation strength as well as functional shoulder motion.  The Prevena wound VAC is fine to be on the hand-held battery-powered unit until his follow-up appointment. Okay to start home health OT and PT tomorrow if possible with rn also next week for eval and treatment.  If that is not feasible okay to start that on Monday.  3 times a week for 6 weeks for the ot pt portion.     Antibiotics given:  Anti-infectives (From admission, onward)    Start     Dose/Rate Route Frequency Ordered Stop   01/12/23 1815  ceFAZolin (ANCEF) IVPB 1 g/50 mL premix        1 g 100 mL/hr over 30 Minutes Intravenous Every 6 hours 01/12/23 1449 01/13/23 0614   01/12/23 1257  vancomycin (VANCOCIN) powder  Status:  Discontinued          As needed 01/12/23 1257 01/12/23 1336   01/12/23 0945  ceFAZolin (ANCEF) IVPB 2g/100 mL premix        2 g 200 mL/hr over 30 Minutes Intravenous On call to O.R. 01/12/23 [id number] 01/12/23 1245      .  Recent vital signs:  Vitals:   01/13/23 0520 01/13/23 0737  BP: 115/66 104/64  Pulse: 61 (!) 58  Resp: 17 15  Temp: 98.2 F (36.8 C) 97.8 F (36.6 C)  SpO2: 95% 100%    Recent laboratory studies:  Results for orders placed or performed during the hospital encounter of 01/12/23  Surgical pcr screen   Specimen: Nasal Mucosa; Nasal Swab  Result Value Ref Range   MRSA, PCR POSITIVE (A) NEGATIVE   Staphylococcus aureus POSITIVE (A) NEGATIVE  Aerobic/Anaerobic Culture w Gram Stain (surgical/deep wound)   Specimen: Synovial, Left Shoulder; Body Fluid  Result Value Ref Range   Specimen Description SYNOVIAL    Special Requests SHOULDER SYNOVIAL FLUID    Gram Stain NO WBC SEEN NO ORGANISMS SEEN     Culture      NO GROWTH < 24 HOURS Performed at [facility], [place] [address] [place], [place] [address]    Report Status PENDING   Aerobic/Anaerobic Culture w Gram Stain (surgical/deep wound)   Specimen: Bone; Tissue  Result Value Ref Range   Specimen Description BONE    Special Requests NONE    Gram Stain NO WBC SEEN NO ORGANISMS SEEN     Culture      NO GROWTH < 24 HOURS Performed at [facility], [place] [address] [place], [place]  [id number]    Report Status PENDING   CBC  Result Value Ref Range   WBC 6.6 4.0 - 10.5 K/uL   RBC 3.87 (L) 4.22 - 5.81 MIL/uL   Hemoglobin 11.8 (L) 13.0 - 17.0 g/dL   HCT 36.1 (L) 39.0 - 52.0 %   MCV 93.3 80.0 - 100.0 fL   MCH 30.5 26.0 - 34.0 pg   MCHC 32.7 30.0 - 36.0 g/dL   RDW 13.5 11.5 - 15.5 %   Platelets 249 150 - 400 K/uL   nRBC 0.0 0.0 - 0.2 %  Basic metabolic panel  Result Value Ref Range   Sodium 137 135 - 145 mmol/L   Potassium 4.0 3.5 - 5.1 mmol/L   Chloride 101 98 - 111 mmol/L   CO2 23 22 - 32 mmol/L   Glucose, Bld 96 70 - 99 mg/dL   BUN 21 8 - 23 mg/dL   Creatinine, Ser 1.12 0.61 - 1.24 mg/dL   Calcium 9.2 8.9 - 10.3 mg/dL   GFR, Estimated >60 >60 mL/min   Anion gap 13 5 - 15    Discharge Medications:    Allergies as of 01/13/2023       Reactions   Morphine Hives, Swelling, Rash   Has taken with no reaction since original reaction        Medication List     STOP taking these medications    Voltaren Arthritis Pain 1 % Gel Generic drug: diclofenac Sodium       TAKE these medications    acetaminophen 325 MG tablet Commonly known as: TYLENOL Take 2 tablets (650 mg total) by mouth every 6 (six) hours as needed for mild pain, fever or headache.   allopurinol 100 MG tablet Commonly known as: ZYLOPRIM Take 1 tablet (100 mg total) by mouth daily.   aspirin EC 81 MG tablet Take 1 tablet (81 mg total) by mouth daily. Swallow whole.   bumetanide 1 MG tablet Commonly known as: Bumex Take 1 tablet (1 mg total) by mouth daily.   buPROPion 300 MG 24 hr tablet Commonly known as: WELLBUTRIN XL Take 1 tablet (300 mg total) by mouth every morning.   busPIRone 15 MG tablet Commonly known as: BUSPAR Take 1 tablet (15 mg total) by mouth 3 (three) times daily.   docusate sodium 100 MG capsule Commonly known as: COLACE Take 1 capsule (100 mg total) by mouth 2 (two) times daily.   enalapril 2.5 MG tablet Commonly known as: VASOTEC Take 1 tablet (2.5 mg total) by mouth daily.   ferrous sulfate 325 (65 FE) MG tablet Take 1 tablet (325 mg total) by mouth every other day.   gabapentin 300 MG capsule Commonly known as: NEURONTIN Take 1 capsule (300 mg total) by mouth 3 (three) times daily.   hydrOXYzine 25 MG tablet Commonly known as: ATARAX Take 25 mg by mouth in the morning, at noon, and at bedtime. (0800, 1400 & 2000) What changed: Another medication with the same name was removed. Continue taking this medication, and follow the directions you see here.   levothyroxine 25 MCG tablet Commonly known as: SYNTHROID Take 25 mcg by mouth daily before breakfast.   melatonin 5 MG Tabs Take 5 mg by mouth at bedtime.   methocarbamol 500 MG tablet Commonly known as: ROBAXIN Take 1  tablet (500 mg total) by mouth every 6 (six) hours as needed for muscle spasms.   morphine 15 MG 12 hr tablet Commonly known as: MS CONTIN Take 15  mg by mouth every 8 (eight) hours.   multivitamin with minerals Tabs tablet Take 1 tablet by mouth 2 (two) times daily.   oxyCODONE 5 MG immediate release tablet Commonly known as: Oxy IR/ROXICODONE Take 1 tablet (5 mg total) by mouth every 6 (six) hours as needed for moderate pain (pain score 4-6).   pantoprazole 40 MG tablet Commonly known as: PROTONIX Take 1 tablet (40 mg total) by mouth 2 (two) times daily.   rosuvastatin 40 MG tablet Commonly known as: CRESTOR Take 1 tablet (40 mg total) by mouth daily.   spironolactone 25 MG tablet Commonly known as: ALDACTONE Take 0.5 tablets (12.5 mg total) by mouth daily.   vitamin D3 25 MCG (1000 UT) tablet Generic drug: Cholecalciferol Take 1 tablet (1,000 Units total) by mouth daily.   zolpidem 10 MG tablet Commonly known as: AMBIEN Take 10 mg by mouth at bedtime.        Diagnostic Studies: No results found.  Disposition: Discharge disposition: 03-Skilled Nursing Facility       Discharge Instructions     Call MD / Call 911   Complete by: As directed    If you experience chest pain or shortness of breath, CALL 911 and be transported to the hospital emergency room.  If you develope a fever above 101 F, pus (white drainage) or increased drainage or redness at the wound, or calf pain, call your surgeon's office.   Constipation Prevention   Complete by: As directed    Drink plenty of fluids.  Prune juice may be helpful.  You may use a stool softener, such as Colace (over the counter) 100 mg twice a day.  Use MiraLax (over the counter) for constipation as needed.   Diet - low sodium heart healthy   Complete by: As directed    Discharge instructions   Complete by: As directed    Use sling Keep wound sponge on Return to clinic in 7 days   Increase activity slowly as  tolerated   Complete by: As directed    Post-operative opioid taper instructions:   Complete by: As directed    POST-OPERATIVE OPIOID TAPER INSTRUCTIONS: It is important to wean off of your opioid medication as soon as possible. If you do not need pain medication after your surgery it is ok to stop day one. Opioids include: Codeine, Hydrocodone(Norco, Vicodin), Oxycodone(Percocet, oxycontin) and hydromorphone amongst others.  Long term and even short term use of opiods can cause: Increased pain response Dependence Constipation Depression Respiratory depression And more.  Withdrawal symptoms can include Flu like symptoms Nausea, vomiting And more Techniques to manage these symptoms Hydrate well Eat regular healthy meals Stay active Use relaxation techniques(deep breathing, meditating, yoga) Do Not substitute Alcohol to help with tapering If you have been on opioids for less than two weeks and do not have pain than it is ok to stop all together.  Plan to wean off of opioids This plan should start within one week post op of your joint replacement. Maintain the same interval or time between taking each dose and first decrease the dose.  Cut the total daily intake of opioids by one tablet each day Next start to increase the time between doses. The last dose that should be eliminated is the evening dose.             Signed: Anderson Malta 01/13/2023, 8:10 AM

## 2023-01-13 NOTE — Progress Notes (Signed)
Discharge instructions are given to the pt. Wound vac is switched to prevena.  All questions are answered.

## 2023-01-13 NOTE — TOC Initial Note (Addendum)
Transition of Care [facility]) - Initial/Assessment Note    Patient Details  Name: Jeremiah [name] MRN: [id number] Date of Birth: 1951-07-09  Transition [facility]) CM/SW Contact:    Joanne Chars, LCSW Phone Number: 01/13/2023, 8:54 AM  Clinical Narrative:    CSW met with pt for initial assessment.  Pt from [facility] on [place] and is planning to return.  Pt reports PT/OT services already in place for him there.  CSW asked for permission to call family contact, pt declines, says no contact.    CSW spoke with [facility].  She confirmed they are expecting pt to return.  Just need written and signed DC summary stating need for PT/OT.  No FL2 needed.  Asked that rx be sent to [facility], [address]/ [place].  MD notified.              1115: TC Sylvia and DON, questions about wound vac, as they are not usually able to accommodate pt with wound vac.  CSW answered questions, confirmed that wound vac just needs to be plugged in and MD will follow up at the next appt.  They asked for several additional items to be added to DC summary related to wound vac and HHPT.  MD notifed.  1210: updated DC summary faxed.  TC Sylvia.  They will review.  They will send transportation to pick pt up, should be here at 1pm.  RN informed.    Expected Discharge Plan: Assisted Living Barriers to Discharge: No Barriers Identified   Patient Goals and CMS Choice Patient states their goals for this hospitalization and ongoing recovery are:: get this shoulder fixed   Choice offered to / list presented to : Patient (pt wants to return to [facility], current residence)      Expected Discharge Plan and Services In-house Referral: Clinical Social Work   Post Acute Care Choice: Resumption of Svcs/PTA Provider Living arrangements for the past 2 months: [place] Expected Discharge Date: 01/13/23                                    Prior Living  Arrangements/Services Living arrangements for the past 2 months: [place] Lives with:: Facility Resident Patient language and need for interpreter reviewed:: Yes Do you feel safe going back to the place where you live?: Yes      Need for Family Participation in Patient Care: No (Comment) Care giver support system in place?: Yes (comment) Current home services: Home PT, Home OT Criminal Activity/Legal Involvement Pertinent to Current Situation/Hospitalization: No - Comment as needed  Activities of Daily Living Home Assistive Devices/Equipment: None ADL Screening (condition at time of admission) Patient's cognitive ability adequate to safely complete daily activities?: Yes Is the patient deaf or have difficulty hearing?: No Does the patient have difficulty seeing, even when wearing glasses/contacts?: No Does the patient have difficulty concentrating, remembering, or making decisions?: No Patient able to express need for assistance with ADLs?: Yes Does the patient have difficulty dressing or bathing?: No Independently performs ADLs?: Yes (appropriate for developmental age) Does the patient have difficulty walking or climbing stairs?: Yes Weakness of Legs: None Weakness of Arms/Hands: Both  Permission Sought/Granted Permission sought to share information with : Family Supports Permission granted to share information with : No  Share Information with NAME: [place]  Emotional Assessment Appearance:: Appears stated age Attitude/Demeanor/Rapport: Engaged Affect (typically observed): Appropriate, Pleasant Orientation: : Oriented to Self, Oriented to Place, Oriented to  Time, Oriented to Situation      Admission diagnosis:  Shoulder effusion, left [M25.412] Shoulder arthritis [M19.019] Patient Active Problem List   Diagnosis Date Noted   Shoulder effusion, left 01/12/2023   Syncope 03/22/2022   Hyperkalemia    Prosthetic joint infection ([place])  02/11/2022   Allergic rhinitis due to pollen 01/30/2022   Amnesia 01/30/2022   Arthropathy 01/30/2022   Body mass index (BMI) 34.0-34.9, adult 01/30/2022   Erectile dysfunction due to arterial insufficiency 01/30/2022   Gout 01/30/2022   Hypertensive heart disease without congestive heart failure 01/30/2022   Insomnia 01/30/2022   Iron deficiency anemia 01/30/2022   Major depressive disorder, single episode, in full remission ([place]) 01/30/2022   Recurrent falls 01/30/2022   Spinal stenosis of lumbosacral region 01/30/2022   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   Vitamin D deficiency 01/30/2022   Wrist pain 01/28/2022   Back pain 01/26/2022   Hypokalemia 01/25/2022   Protein-calorie malnutrition, severe 01/20/2022   CAD (coronary artery disease) 01/18/2022   Thoracic aortic aneurysm (TAA) ([place]) 01/17/2022   Chronic combined systolic and diastolic heart failure ([place]) 01/16/2022   Atrial flutter ([place]) 01/16/2022   Traumatic hematoma of left shoulder 01/16/2022   Iron deficiency anemia due to chronic blood loss 01/16/2022   Alcohol abuse 01/16/2022   Lower leg edema 01/16/2022   B12 deficiency 01/16/2022   Hypotension 01/16/2022   Shoulder wound, left, initial encounter 01/16/2022   Depression    Chest pain 01/15/2022   Alcohol withdrawal syndrome with complication ([place]) 0000000   Homeless 11/22/2021   Opioid dependence ([place]) 11/22/2021   Generalized anxiety disorder 09/27/2021   Severe recurrent major 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 ([place]) 06/06/2021   Pressure injury of both heels, stage 1 05/18/2021   Substance use disorder 05/17/2021   History of coronary artery disease 05/17/2021   Pulmonary embolism ([place]) 04/23/2021   Degenerative disc disease, lumbar 04/23/2021   History of coronary artery bypass graft x 3 04/23/2021   History of gastric bypass 04/23/2021   Chronic pain disorder 04/23/2021   Primary osteoarthritis involving multiple joints 04/23/2021   History of  pulmonary embolism 03/29/2021   Polypharmacy 03/29/2021   Major neurocognitive disorder ([place]) 02/11/2021   Hypoalbuminemia 01/31/2021   AKI (acute kidney injury) ([place]) 10/13/2020   GERD (gastroesophageal reflux disease) 10/13/2020   Shoulder arthritis 02/27/2018   Morbid obesity ([place]) 07/24/2013   Expected blood loss anemia 07/24/2013   S/P right TKA 07/23/2013   AAA (abdominal aortic aneurysm) without rupture ([place]) 03/18/2011   EDEMA 03/09/2009   HYPERCHOLESTEROLEMIA 03/06/2009   Essential hypertension 03/06/2009   Coronary atherosclerosis 03/06/2009   BUNDLE BRANCH BLOCK, RIGHT 03/06/2009   BRADYCARDIA 03/06/2009   PCP:  Pcp, No Pharmacy:   [place], [place] - [address] [place] [address] Phone: [phone] Fax: [phone]     Social Determinants of Health (SDOH) Social History: SDOH Screenings   Food Insecurity: No Food Insecurity (01/12/2023)  Housing: Low Risk  (01/12/2023)  Transportation Needs: No Transportation Needs (01/12/2023)  Utilities: Not At Risk (01/12/2023)  Tobacco Use: Low Risk  (01/13/2023)   SDOH Interventions:     Readmission Risk Interventions    03/23/2022   12:11 PM 03/02/2022    1:30 PM 02/21/2022   11:53 AM  Readmission Risk Prevention Plan  Transportation Screening Complete Complete  Complete  Medication Review Press photographer) Complete Complete Complete  PCP or Specialist appointment within 3-5 days of discharge  Complete Complete  HRI or Home Care Consult Complete Complete Complete  SW Recovery Care/Counseling Consult Complete Complete Complete  Palliative Care Screening Not Applicable  Not [place] Not Applicable Not Applicable Not Applicable

## 2023-01-13 NOTE — Evaluation (Signed)
Occupational Therapy Evaluation Patient Details Name: Jeremiah [name] MRN: [id number] DOB: [birth year] Today's Date: 01/13/2023   History of Present Illness Pt is a 72 y.o. male s/p L shoulder removal of antibiotic spacer on 2/22. Initial L shoulder replacement removal ~1 year ago. PMH significant for anxiety, arthritis, gout, GERD, CAD, HTN, hypercholesteremia, R bundle branch block.   Clinical Impression   PTA, pt from ALF and reports that staff provided supervision only during ADL and that he was independent in functional mobility. Upon eval, pt performing UB ADL with up to mod A and LB ADL with supervision. Pt required cues during session for optimal technique, but no overt safety concerns. Pt educated and demonstrating therapeutic exercises recommended per shoulder orders, UB bathing, UB dressing, sling application, sleep positioning, LE dressing, and is aware of NWB precautions. Recommending follow physicians orders for discharge therapies.      Recommendations for follow up therapy are one component of a multi-disciplinary discharge planning process, led by the attending physician.  Recommendations may be updated based on patient status, additional functional criteria and insurance authorization.   Follow Up Recommendations  Follow physician's recommendations for discharge plan and follow up therapies     Assistance Recommended at Discharge Intermittent Supervision/Assistance  Patient can return home with the following A little help with bathing/dressing/bathroom;Assistance with cooking/housework;Help with stairs or ramp for entrance;Assist for transportation    Functional Status Assessment  Patient has had a recent decline in their functional status and demonstrates the ability to make significant improvements in function in a reasonable and predictable amount of time.  Equipment Recommendations  None recommended by OT    Recommendations for Other Services       Precautions /  Restrictions Precautions Precautions: Shoulder Type of Shoulder Precautions: NWB, AROM elbow/wrist/hand, OK for pendulums, PROM shoulder flexion to 90, ext rotation to 30      Mobility Bed Mobility Overal bed mobility: Modified Independent             General bed mobility comments: increased time. Provided education re: sleep positioning    Transfers Overall transfer level: Modified independent Equipment used: None               General transfer comment: incr time      Balance Overall balance assessment: Mild deficits observed, not formally tested                                         ADL either performed or assessed with clinical judgement   ADL Overall ADL's : Needs assistance/impaired Eating/Feeding: Modified independent   Grooming: Modified independent;Standing Grooming Details (indicate cue type and reason): able to use LUE as helper hand Upper Body Bathing: Sitting;Supervision/ safety Upper Body Bathing Details (indicate cue type and reason): Pt able to return demo use of compensatory techniques Lower Body Bathing: Supervison/ safety;Sit to/from stand   Upper Body Dressing : Moderate assistance;Sitting Upper Body Dressing Details (indicate cue type and reason): to don sling Lower Body Dressing: Supervision/safety;Sitting/lateral leans Lower Body Dressing Details (indicate cue type and reason): donning socks; provided education regarding compensatory techniques Toilet Transfer: Ambulation;Modified Independent Toilet Transfer Details (indicate cue type and reason): assist for management of wound vac, lines, etc only Toileting- Clothing Manipulation and Hygiene: Modified independent;Sitting/lateral lean   Tub/ Shower Transfer: Walk-in shower;Ambulation;Modified independent   Functional mobility during ADLs: Modified independent  Vision Baseline Vision/History: 1 Wears glasses Ability to See in Adequate Light: 0 Adequate Patient  Visual Report: No change from baseline Additional Comments: Pt did not have glasses present in room, and thus difficulty reading handouts. Reports he will be able to read then when corrective lenses are donned     Perception     Praxis      Pertinent Vitals/Pain Pain Assessment Pain Assessment: No/denies pain     Hand Dominance Right   Extremity/Trunk Assessment Upper Extremity Assessment Upper Extremity Assessment: LUE deficits/detail LUE Deficits / Details: elbow flexion to ~95 degrees, supination to ~10 degrees; otherwise forearm and hand ROM WFL. Pt performing self PROM to shoulder flexion ~30 degrees, abduction to ~10 degrees, ext rotation to neutral. LUE Sensation:  (Pt reports sensation coming back in hand) LUE Coordination: decreased gross motor   Lower Extremity Assessment Lower Extremity Assessment: Defer to PT evaluation;LLE deficits/detail LLE Deficits / Details: Decr ROM in L ankle at baseline       Communication Communication Communication: No difficulties   Cognition Arousal/Alertness: Awake/alert Behavior During Therapy: WFL for tasks assessed/performed Overall Cognitive Status: Within Functional Limits for tasks assessed                                 General Comments: Suspect some mild memory difficulties, as pt asking questions that have already been answered during session, however, likley baseline as a lot of information was provided. All handouts provided as well. Pt reporting he did not have his glasses with him.     General Comments  VSS    Exercises Exercises: Shoulder Shoulder Exercises Pendulum Exercise: PROM, Left, 10 reps, Standing, Seated Shoulder Flexion: PROM, Left, Seated Shoulder ABduction: PROM, Left, 10 reps, Seated Elbow Flexion: 10 reps, Left, Seated, AROM Elbow Extension: Left, 10 reps, Seated, AROM Wrist Flexion: Left, 10 reps, Seated, AROM Wrist Extension: Left, 10 reps, Seated, AROM Digit Composite Flexion:  AROM, Left, 10 reps, Seated Composite Extension: AROM, Left, 10 reps, Seated   Shoulder Instructions Shoulder Instructions Donning/doffing shirt without moving shoulder: Minimal assistance Method for sponge bathing under operated UE: Supervision/safety Donning/doffing sling/immobilizer: Moderate assistance Correct positioning of sling/immobilizer: Supervision/safety Pendulum exercises (written home exercise program): Supervision/safety ROM for elbow, wrist and digits of operated UE: Modified independent (with handout) Sling wearing schedule (on at all times/off for ADL's): Modified independent (with handout) Proper positioning of operated UE when showering: Supervision/safety Positioning of UE while sleeping: Modified independent (may reference handout)    Home Living Family/patient expects to be discharged to:: Assisted living [facility] [place] in [place])                             Home Equipment: Shower seat - built in          Prior Functioning/Environment Prior Level of Function : Needs assist             Mobility Comments: no DME ADLs Comments: ADL with supervision which per pt is policy where he lives and he denies receiving any physical assist but reports staff can help if he needs it. Meals provided, staff does laudry and light housekeeping; pt makes his own bed.        OT Problem List: Decreased strength;Decreased activity tolerance;Decreased range of motion;Impaired balance (sitting and/or standing);Decreased coordination;Decreased knowledge of use of DME or AE;Decreased knowledge of precautions;Impaired UE functional use  OT Treatment/Interventions: Self-care/ADL training;Therapeutic exercise;DME and/or AE instruction;Patient/family education;Balance training;Therapeutic activities    OT Goals(Current goals can be found in the care plan section) Acute Rehab OT Goals Patient Stated Goal: get betrter OT Goal Formulation: With patient Time For Goal  Achievement: 01/26/23 Potential to Achieve Goals: Good  OT Frequency: Min 2X/week    Co-evaluation              AM-PAC OT "6 Clicks" Daily Activity     Outcome Measure Help from another person eating meals?: None Help from another person taking care of personal grooming?: None Help from another person toileting, which includes using toliet, bedpan, or urinal?: None Help from another person bathing (including washing, rinsing, drying)?: A Little Help from another person to put on and taking off regular upper body clothing?: A Little Help from another person to put on and taking off regular lower body clothing?: A Little 6 Click Score: 21   End of Session Equipment Utilized During Treatment: Other (comment) (R shoulder sling) Nurse Communication: Mobility status;Other (comment);Weight bearing status;Precautions (Pt reports that he was not able to order breakfast when he called downstairs but that he would like to eat)  Activity Tolerance: Patient tolerated treatment well Patient left: in chair;with call bell/phone within reach;with chair alarm set  OT Visit Diagnosis: Unsteadiness on feet (R26.81);Muscle weakness (generalized) (M62.81)                Time: [id number] OT Time Calculation (min): 45 min Charges:  OT General Charges $OT Visit: 1 Visit OT Evaluation $OT Eval Low Complexity: 1 Low OT Treatments $Self Care/Home Management : 8-22 mins $Therapeutic Exercise: 8-22 mins  Elder Cyphers, OTR/L [facility] Acute Rehabilitation Office: [phone]   Magnus Ivan 01/13/2023, 9:07 AM

## 2023-01-13 NOTE — H&P (Incomplete)
Report called to facility patient is discharging to.

## 2023-01-13 NOTE — Progress Notes (Signed)
Pt stable Dressing dry left shoulder Ot working with patient Ready for dc to [facility] from medical standpoint Will use provena for 7 days

## 2023-01-16 ENCOUNTER — Telehealth: Payer: Self-pay | Admitting: Orthopedic Surgery

## 2023-01-16 DIAGNOSIS — F5101 Primary insomnia: Secondary | ICD-10-CM | POA: Diagnosis not present

## 2023-01-16 NOTE — Telephone Encounter (Signed)
[facility] (P) [id number].  IS REQUESTING VERBAL ORDERS FOR PATIENT

## 2023-01-16 NOTE — Telephone Encounter (Signed)
What do they need verbal orders for?

## 2023-01-17 DIAGNOSIS — M6281 Muscle weakness (generalized): Secondary | ICD-10-CM | POA: Diagnosis not present

## 2023-01-17 LAB — AEROBIC/ANAEROBIC CULTURE W GRAM STAIN (SURGICAL/DEEP WOUND)
Culture: NO GROWTH
Culture: NO GROWTH
Gram Stain: NONE SEEN
Gram Stain: NONE SEEN

## 2023-01-17 NOTE — Telephone Encounter (Signed)
Received message stating its for nursing, PT, and OT

## 2023-01-18 ENCOUNTER — Telehealth: Payer: Self-pay

## 2023-01-18 DIAGNOSIS — R2689 Other abnormalities of gait and mobility: Secondary | ICD-10-CM | POA: Diagnosis not present

## 2023-01-18 NOTE — Telephone Encounter (Signed)
[facility] called stating that an order is needed for home health for patient.  Stated that someone can call and give a verbal or enter the order in EPIC.  CB# [phone] ask for Mariella Saa or Vicente Males.  Please advise.  Thank You

## 2023-01-19 ENCOUNTER — Telehealth: Payer: Self-pay | Admitting: Orthopedic Surgery

## 2023-01-19 DIAGNOSIS — M6281 Muscle weakness (generalized): Secondary | ICD-10-CM | POA: Diagnosis not present

## 2023-01-19 NOTE — Telephone Encounter (Signed)
[facility] called in stating medicare rules say they are not allowed to do just OT he would have to do PT and OT and then discharge the PT and do only OT or just do them both together calback number is [phone] they would like to know how the Dr wants them to move forward

## 2023-01-19 NOTE — Telephone Encounter (Signed)
IC advised per Luke's note.

## 2023-01-19 NOTE — Telephone Encounter (Signed)
Okay for home health OT for shoulder.  Dont think he needs PT or nursing

## 2023-01-20 ENCOUNTER — Ambulatory Visit (INDEPENDENT_AMBULATORY_CARE_PROVIDER_SITE_OTHER): Payer: Medicare Other | Admitting: Surgical

## 2023-01-20 ENCOUNTER — Encounter: Payer: Self-pay | Admitting: Orthopedic Surgery

## 2023-01-20 DIAGNOSIS — Z9889 Other specified postprocedural states: Secondary | ICD-10-CM
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
## 2023-01-23 DIAGNOSIS — M6281 Muscle weakness (generalized): Secondary | ICD-10-CM | POA: Diagnosis not present

## 2023-01-24 NOTE — Telephone Encounter (Signed)
Okay for PT and OT and then cancel PT

## 2023-01-25 ENCOUNTER — Encounter: Payer: Self-pay | Admitting: Orthopedic Surgery

## 2023-01-25 ENCOUNTER — Ambulatory Visit (INDEPENDENT_AMBULATORY_CARE_PROVIDER_SITE_OTHER): Payer: Medicare Other | Admitting: Orthopedic Surgery

## 2023-01-25 DIAGNOSIS — M6281 Muscle weakness (generalized): Secondary | ICD-10-CM | POA: Diagnosis not present

## 2023-01-25 DIAGNOSIS — Z9889 Other specified postprocedural states: Secondary | ICD-10-CM
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
## 2023-01-25 NOTE — Telephone Encounter (Signed)
I called and gave verbal ok. 

## 2023-01-30 DIAGNOSIS — R2689 Other abnormalities of gait and mobility: Secondary | ICD-10-CM | POA: Diagnosis not present

## 2023-01-31 DIAGNOSIS — M6281 Muscle weakness (generalized): Secondary | ICD-10-CM | POA: Diagnosis not present

## 2023-02-02 DIAGNOSIS — M6281 Muscle weakness (generalized): Secondary | ICD-10-CM | POA: Diagnosis not present

## 2023-02-03 DIAGNOSIS — R2689 Other abnormalities of gait and mobility: Secondary | ICD-10-CM | POA: Diagnosis not present

## 2023-02-07 DIAGNOSIS — M6281 Muscle weakness (generalized): Secondary | ICD-10-CM | POA: Diagnosis not present

## 2023-02-07 DIAGNOSIS — M62830 Muscle spasm of back: Secondary | ICD-10-CM | POA: Diagnosis not present

## 2023-02-07 DIAGNOSIS — G894 Chronic pain syndrome: Secondary | ICD-10-CM | POA: Diagnosis not present

## 2023-02-07 DIAGNOSIS — M47817 Spondylosis without myelopathy or radiculopathy, lumbosacral region: Secondary | ICD-10-CM | POA: Diagnosis not present

## 2023-02-07 DIAGNOSIS — G5601 Carpal tunnel syndrome, right upper limb: Secondary | ICD-10-CM | POA: Diagnosis not present

## 2023-02-08 DIAGNOSIS — R2689 Other abnormalities of gait and mobility: Secondary | ICD-10-CM | POA: Diagnosis not present

## 2023-02-09 DIAGNOSIS — G894 Chronic pain syndrome: Secondary | ICD-10-CM | POA: Diagnosis not present

## 2023-02-09 DIAGNOSIS — F5101 Primary insomnia: Secondary | ICD-10-CM | POA: Diagnosis not present

## 2023-02-13 DIAGNOSIS — M6281 Muscle weakness (generalized): Secondary | ICD-10-CM | POA: Diagnosis not present

## 2023-02-15 DIAGNOSIS — E039 Hypothyroidism, unspecified: Secondary | ICD-10-CM | POA: Diagnosis not present

## 2023-02-15 DIAGNOSIS — G894 Chronic pain syndrome: Secondary | ICD-10-CM | POA: Diagnosis not present

## 2023-02-15 DIAGNOSIS — I5042 Chronic combined systolic (congestive) and diastolic (congestive) heart failure: Secondary | ICD-10-CM | POA: Diagnosis not present

## 2023-02-15 DIAGNOSIS — I10 Essential (primary) hypertension: Secondary | ICD-10-CM | POA: Diagnosis not present

## 2023-02-15 DIAGNOSIS — I25708 Atherosclerosis of coronary artery bypass graft(s), unspecified, with other forms of angina pectoris: Secondary | ICD-10-CM | POA: Diagnosis not present

## 2023-02-16 DIAGNOSIS — R2689 Other abnormalities of gait and mobility: Secondary | ICD-10-CM | POA: Diagnosis not present

## 2023-02-16 DIAGNOSIS — M6281 Muscle weakness (generalized): Secondary | ICD-10-CM | POA: Diagnosis not present

## 2023-02-17 DIAGNOSIS — R2689 Other abnormalities of gait and mobility: Secondary | ICD-10-CM | POA: Diagnosis not present

## 2023-02-20 DIAGNOSIS — M6281 Muscle weakness (generalized): Secondary | ICD-10-CM | POA: Diagnosis not present

## 2023-02-21 DIAGNOSIS — M6281 Muscle weakness (generalized): Secondary | ICD-10-CM | POA: Diagnosis not present

## 2023-02-27 DIAGNOSIS — R2689 Other abnormalities of gait and mobility: Secondary | ICD-10-CM | POA: Diagnosis not present

## 2023-02-28 DIAGNOSIS — M6281 Muscle weakness (generalized): Secondary | ICD-10-CM | POA: Diagnosis not present

## 2023-03-02 DIAGNOSIS — M6281 Muscle weakness (generalized): Secondary | ICD-10-CM | POA: Diagnosis not present

## 2023-03-03 DIAGNOSIS — R2689 Other abnormalities of gait and mobility: Secondary | ICD-10-CM | POA: Diagnosis not present

## 2023-03-07 DIAGNOSIS — M6281 Muscle weakness (generalized): Secondary | ICD-10-CM | POA: Diagnosis not present

## 2023-03-08 DIAGNOSIS — M62830 Muscle spasm of back: Secondary | ICD-10-CM | POA: Diagnosis not present

## 2023-03-08 DIAGNOSIS — M47817 Spondylosis without myelopathy or radiculopathy, lumbosacral region: Secondary | ICD-10-CM | POA: Diagnosis not present

## 2023-03-08 DIAGNOSIS — G894 Chronic pain syndrome: Secondary | ICD-10-CM | POA: Diagnosis not present

## 2023-03-08 DIAGNOSIS — R2689 Other abnormalities of gait and mobility: Secondary | ICD-10-CM | POA: Diagnosis not present

## 2023-03-08 DIAGNOSIS — G5601 Carpal tunnel syndrome, right upper limb: Secondary | ICD-10-CM | POA: Diagnosis not present

## 2023-03-09 DIAGNOSIS — F5101 Primary insomnia: Secondary | ICD-10-CM | POA: Diagnosis not present

## 2023-03-09 DIAGNOSIS — G894 Chronic pain syndrome: Secondary | ICD-10-CM | POA: Diagnosis not present

## 2023-03-09 DIAGNOSIS — M6281 Muscle weakness (generalized): Secondary | ICD-10-CM | POA: Diagnosis not present

## 2023-03-14 DIAGNOSIS — M6281 Muscle weakness (generalized): Secondary | ICD-10-CM | POA: Diagnosis not present

## 2023-03-15 DIAGNOSIS — R2689 Other abnormalities of gait and mobility: Secondary | ICD-10-CM | POA: Diagnosis not present

## 2023-03-16 DIAGNOSIS — M6281 Muscle weakness (generalized): Secondary | ICD-10-CM | POA: Diagnosis not present

## 2023-03-21 DIAGNOSIS — M6281 Muscle weakness (generalized): Secondary | ICD-10-CM | POA: Diagnosis not present

## 2023-03-22 ENCOUNTER — Ambulatory Visit (INDEPENDENT_AMBULATORY_CARE_PROVIDER_SITE_OTHER): Payer: Medicare Other | Admitting: Surgical

## 2023-03-22 ENCOUNTER — Other Ambulatory Visit (INDEPENDENT_AMBULATORY_CARE_PROVIDER_SITE_OTHER): Payer: Medicare Other

## 2023-03-22 ENCOUNTER — Encounter: Payer: Self-pay | Admitting: Orthopedic Surgery

## 2023-03-22 DIAGNOSIS — Z9889 Other specified postprocedural states: Secondary | ICD-10-CM | POA: Diagnosis not present

## 2023-03-22 DIAGNOSIS — M25512 Pain in left shoulder: Secondary | ICD-10-CM | POA: Diagnosis not present

## 2023-03-22 DIAGNOSIS — E559 Vitamin D deficiency, unspecified: Secondary | ICD-10-CM | POA: Diagnosis not present

## 2023-03-22 DIAGNOSIS — T8459XA Infection and inflammatory reaction due to other internal joint prosthesis, initial encounter: Secondary | ICD-10-CM | POA: Diagnosis not present
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
## 2023-03-23 DIAGNOSIS — M6281 Muscle weakness (generalized): Secondary | ICD-10-CM | POA: Diagnosis not present

## 2023-03-23 LAB — CBC WITH DIFFERENTIAL/PLATELET
Absolute Monocytes: 467 cells/uL (ref 200–950)
Basophils Absolute: 51 cells/uL (ref 0–200)
Basophils Relative: 0.8 %
Eosinophils Absolute: 141 cells/uL (ref 15–500)
Eosinophils Relative: 2.2 %
HCT: 38.9 % (ref 38.5–50.0)
Hemoglobin: 12.5 g/dL — ABNORMAL LOW (ref 13.2–17.1)
Lymphs Abs: 1594 cells/uL (ref 850–3900)
MCH: 29.6 pg (ref 27.0–33.0)
MCHC: 32.1 g/dL (ref 32.0–36.0)
MCV: 92 fL (ref 80.0–100.0)
MPV: 11.1 fL (ref 7.5–12.5)
Monocytes Relative: 7.3 %
Neutro Abs: 4147 cells/uL (ref 1500–7800)
Neutrophils Relative %: 64.8 %
Platelets: 191 10*3/uL (ref 140–400)
RBC: 4.23 10*6/uL (ref 4.20–5.80)
RDW: 12.2 % (ref 11.0–15.0)
Total Lymphocyte: 24.9 %
WBC: 6.4 10*3/uL (ref 3.8–10.8)

## 2023-03-23 LAB — COMPLETE METABOLIC PANEL WITH GFR
AG Ratio: 1.3 (calc) (ref 1.0–2.5)
ALT: 26 U/L (ref 9–46)
AST: 42 U/L — ABNORMAL HIGH (ref 10–35)
Albumin: 3.9 g/dL (ref 3.6–5.1)
Alkaline phosphatase (APISO): 109 U/L (ref 35–144)
BUN/Creatinine Ratio: 24 (calc) — ABNORMAL HIGH (ref 6–22)
BUN: 32 mg/dL — ABNORMAL HIGH (ref 7–25)
CO2: 27 mmol/L (ref 20–32)
Calcium: 9.4 mg/dL (ref 8.6–10.3)
Chloride: 102 mmol/L (ref 98–110)
Creat: 1.31 mg/dL — ABNORMAL HIGH (ref 0.70–1.28)
Globulin: 3 g/dL (calc) (ref 1.9–3.7)
Glucose, Bld: 95 mg/dL (ref 65–99)
Potassium: 5.5 mmol/L — ABNORMAL HIGH (ref 3.5–5.3)
Sodium: 140 mmol/L (ref 135–146)
Total Bilirubin: 0.5 mg/dL (ref 0.2–1.2)
Total Protein: 6.9 g/dL (ref 6.1–8.1)
eGFR: 59 mL/min/{1.73_m2} — ABNORMAL LOW (ref >=60)

## 2023-03-23 LAB — PREALBUMIN: Prealbumin: 21 mg/dL (ref 21–43)

## 2023-03-23 LAB — VITAMIN D 25 HYDROXY (VIT D DEFICIENCY, FRACTURES): Vit D, 25-Hydroxy: 59 ng/mL (ref 30–100)

## 2023-03-23 LAB — SEDIMENTATION RATE: Sed Rate: 28 mm/h — ABNORMAL HIGH (ref 0–20)

## 2023-03-23 LAB — C-REACTIVE PROTEIN: CRP: <3 mg/L (ref <8.0)

## 2023-03-24 DIAGNOSIS — R2689 Other abnormalities of gait and mobility: Secondary | ICD-10-CM | POA: Diagnosis not present

## 2023-03-27 DIAGNOSIS — M6281 Muscle weakness (generalized): Secondary | ICD-10-CM | POA: Diagnosis not present

## 2023-03-30 DIAGNOSIS — M6281 Muscle weakness (generalized): Secondary | ICD-10-CM | POA: Diagnosis not present

## 2023-03-31 DIAGNOSIS — F5101 Primary insomnia: Secondary | ICD-10-CM | POA: Diagnosis not present

## 2023-03-31 DIAGNOSIS — G894 Chronic pain syndrome: Secondary | ICD-10-CM | POA: Diagnosis not present

## 2023-04-04 DIAGNOSIS — M6281 Muscle weakness (generalized): Secondary | ICD-10-CM | POA: Diagnosis not present

## 2023-04-05 DIAGNOSIS — R2689 Other abnormalities of gait and mobility: Secondary | ICD-10-CM | POA: Diagnosis not present

## 2023-04-06 DIAGNOSIS — G894 Chronic pain syndrome: Secondary | ICD-10-CM | POA: Diagnosis not present

## 2023-04-06 DIAGNOSIS — M62830 Muscle spasm of back: Secondary | ICD-10-CM | POA: Diagnosis not present

## 2023-04-06 DIAGNOSIS — M47817 Spondylosis without myelopathy or radiculopathy, lumbosacral region: Secondary | ICD-10-CM | POA: Diagnosis not present

## 2023-04-06 DIAGNOSIS — M6281 Muscle weakness (generalized): Secondary | ICD-10-CM | POA: Diagnosis not present

## 2023-04-06 DIAGNOSIS — G5601 Carpal tunnel syndrome, right upper limb: Secondary | ICD-10-CM | POA: Diagnosis not present

## 2023-04-11 DIAGNOSIS — M6281 Muscle weakness (generalized): Secondary | ICD-10-CM | POA: Diagnosis not present

## 2023-04-13 DIAGNOSIS — M6281 Muscle weakness (generalized): Secondary | ICD-10-CM | POA: Diagnosis not present

## 2023-04-18 DIAGNOSIS — M6281 Muscle weakness (generalized): Secondary | ICD-10-CM | POA: Diagnosis not present

## 2023-04-20 DIAGNOSIS — M6281 Muscle weakness (generalized): Secondary | ICD-10-CM | POA: Diagnosis not present

## 2023-04-25 DIAGNOSIS — M6281 Muscle weakness (generalized): Secondary | ICD-10-CM | POA: Diagnosis not present

## 2023-04-27 DIAGNOSIS — M6281 Muscle weakness (generalized): Secondary | ICD-10-CM | POA: Diagnosis not present

## 2023-05-04 DIAGNOSIS — M47817 Spondylosis without myelopathy or radiculopathy, lumbosacral region: Secondary | ICD-10-CM | POA: Diagnosis not present

## 2023-05-04 DIAGNOSIS — G894 Chronic pain syndrome: Secondary | ICD-10-CM | POA: Diagnosis not present

## 2023-05-04 DIAGNOSIS — Z79891 Long term (current) use of opiate analgesic: Secondary | ICD-10-CM | POA: Diagnosis not present

## 2023-05-04 DIAGNOSIS — M62830 Muscle spasm of back: Secondary | ICD-10-CM | POA: Diagnosis not present

## 2023-05-04 DIAGNOSIS — G5601 Carpal tunnel syndrome, right upper limb: Secondary | ICD-10-CM | POA: Diagnosis not present

## 2023-05-15 DIAGNOSIS — M6281 Muscle weakness (generalized): Secondary | ICD-10-CM | POA: Diagnosis not present

## 2023-05-18 DIAGNOSIS — K5903 Drug induced constipation: Secondary | ICD-10-CM | POA: Diagnosis not present

## 2023-05-18 DIAGNOSIS — G894 Chronic pain syndrome: Secondary | ICD-10-CM | POA: Diagnosis not present

## 2023-05-18 DIAGNOSIS — I5042 Chronic combined systolic (congestive) and diastolic (congestive) heart failure: Secondary | ICD-10-CM | POA: Diagnosis not present

## 2023-05-18 DIAGNOSIS — I10 Essential (primary) hypertension: Secondary | ICD-10-CM | POA: Diagnosis not present

## 2023-05-18 DIAGNOSIS — E039 Hypothyroidism, unspecified: Secondary | ICD-10-CM | POA: Diagnosis not present

## 2023-05-23 DIAGNOSIS — G5601 Carpal tunnel syndrome, right upper limb: Secondary | ICD-10-CM | POA: Diagnosis not present

## 2023-05-23 DIAGNOSIS — M62830 Muscle spasm of back: Secondary | ICD-10-CM | POA: Diagnosis not present

## 2023-05-23 DIAGNOSIS — M47817 Spondylosis without myelopathy or radiculopathy, lumbosacral region: Secondary | ICD-10-CM | POA: Diagnosis not present

## 2023-05-23 DIAGNOSIS — G894 Chronic pain syndrome: Secondary | ICD-10-CM | POA: Diagnosis not present

## 2023-05-24 DIAGNOSIS — G894 Chronic pain syndrome: Secondary | ICD-10-CM | POA: Diagnosis not present

## 2023-05-24 DIAGNOSIS — F5101 Primary insomnia: Secondary | ICD-10-CM | POA: Diagnosis not present

## 2023-06-17 DIAGNOSIS — I10 Essential (primary) hypertension: Secondary | ICD-10-CM | POA: Diagnosis not present

## 2023-06-17 DIAGNOSIS — I5042 Chronic combined systolic (congestive) and diastolic (congestive) heart failure: Secondary | ICD-10-CM | POA: Diagnosis not present

## 2023-06-22 DIAGNOSIS — F5101 Primary insomnia: Secondary | ICD-10-CM | POA: Diagnosis not present

## 2023-06-22 DIAGNOSIS — G894 Chronic pain syndrome: Secondary | ICD-10-CM | POA: Diagnosis not present
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
## 2023-07-03 ENCOUNTER — Ambulatory Visit: Payer: Medicare Other | Attending: General Practice | Admitting: General Practice

## 2023-07-03 ENCOUNTER — Encounter: Payer: Self-pay | Admitting: General Practice

## 2023-07-03 VITALS — BP 116/82 | HR 91 | Ht 67.0 in | Wt 192.4 lb

## 2023-07-03 DIAGNOSIS — M47817 Spondylosis without myelopathy or radiculopathy, lumbosacral region: Secondary | ICD-10-CM | POA: Diagnosis not present

## 2023-07-03 DIAGNOSIS — G5601 Carpal tunnel syndrome, right upper limb: Secondary | ICD-10-CM | POA: Diagnosis not present

## 2023-07-03 DIAGNOSIS — I2510 Atherosclerotic heart disease of native coronary artery without angina pectoris: Secondary | ICD-10-CM

## 2023-07-03 DIAGNOSIS — I480 Paroxysmal atrial fibrillation: Secondary | ICD-10-CM

## 2023-07-03 DIAGNOSIS — I5042 Chronic combined systolic (congestive) and diastolic (congestive) heart failure: Secondary | ICD-10-CM

## 2023-07-03 DIAGNOSIS — E782 Mixed hyperlipidemia: Secondary | ICD-10-CM | POA: Diagnosis not present

## 2023-07-03 DIAGNOSIS — M62830 Muscle spasm of back: Secondary | ICD-10-CM | POA: Diagnosis not present

## 2023-07-03 DIAGNOSIS — I10 Essential (primary) hypertension: Secondary | ICD-10-CM | POA: Diagnosis not present

## 2023-07-03 DIAGNOSIS — G894 Chronic pain syndrome: Secondary | ICD-10-CM | POA: Diagnosis not present

## 2023-07-03 NOTE — Patient Instructions (Signed)
Medication Instructions:  The current medical regimen is effective;  continue present plan and medications as directed. Please refer to the Current Medication list given to you today.  *If you need a refill on your cardiac medications before your next appointment, please call your pharmacy*  Lab Work: NONE If you have labs (blood work) drawn today and your tests are completely normal, you will receive your results only by:  MyChart Message (if you have MyChart) OR  A paper copy in the mail If you have any lab test that is abnormal or we need to change your treatment, we will call you to review the results.  Other Instructions PLEASE READ AND FOLLOW ATTACHED  SALTY 6   Follow-Up: At [facility], you and your health needs are our priority.  As part of our continuing mission to provide you with exceptional heart care, we have created designated Provider Care Teams.  These Care Teams include your primary Cardiologist (physician) and Advanced Practice Providers (APPs -  Physician Assistants and Nurse Practitioners) who all work together to provide you with the care you need, when you need it.  Your next appointment:   9-12 month(s)  Provider:   Charlton Haws, MD  or Edd Fabian, FNP or ANY APP

## 2023-07-18 DIAGNOSIS — I5042 Chronic combined systolic (congestive) and diastolic (congestive) heart failure: Secondary | ICD-10-CM | POA: Diagnosis not present

## 2023-07-18 DIAGNOSIS — I10 Essential (primary) hypertension: Secondary | ICD-10-CM | POA: Diagnosis not present

## 2023-07-20 DIAGNOSIS — F5101 Primary insomnia: Secondary | ICD-10-CM | POA: Diagnosis not present

## 2023-07-20 DIAGNOSIS — G894 Chronic pain syndrome: Secondary | ICD-10-CM | POA: Diagnosis not present

## 2023-08-02 DIAGNOSIS — M47817 Spondylosis without myelopathy or radiculopathy, lumbosacral region: Secondary | ICD-10-CM | POA: Diagnosis not present

## 2023-08-02 DIAGNOSIS — G894 Chronic pain syndrome: Secondary | ICD-10-CM | POA: Diagnosis not present

## 2023-08-02 DIAGNOSIS — G5601 Carpal tunnel syndrome, right upper limb: Secondary | ICD-10-CM | POA: Diagnosis not present

## 2023-08-02 DIAGNOSIS — M62830 Muscle spasm of back: Secondary | ICD-10-CM | POA: Diagnosis not present

## 2023-08-07 DIAGNOSIS — G894 Chronic pain syndrome: Secondary | ICD-10-CM | POA: Diagnosis not present

## 2023-08-07 DIAGNOSIS — Z23 Encounter for immunization: Secondary | ICD-10-CM | POA: Diagnosis not present

## 2023-08-07 DIAGNOSIS — I5042 Chronic combined systolic (congestive) and diastolic (congestive) heart failure: Secondary | ICD-10-CM | POA: Diagnosis not present

## 2023-08-07 DIAGNOSIS — I10 Essential (primary) hypertension: Secondary | ICD-10-CM | POA: Diagnosis not present

## 2023-08-07 DIAGNOSIS — E039 Hypothyroidism, unspecified: Secondary | ICD-10-CM | POA: Diagnosis not present

## 2023-08-17 DIAGNOSIS — G894 Chronic pain syndrome: Secondary | ICD-10-CM | POA: Diagnosis not present

## 2023-08-17 DIAGNOSIS — F5101 Primary insomnia: Secondary | ICD-10-CM | POA: Diagnosis not present

## 2023-08-30 DIAGNOSIS — M62830 Muscle spasm of back: Secondary | ICD-10-CM | POA: Diagnosis not present

## 2023-08-30 DIAGNOSIS — G894 Chronic pain syndrome: Secondary | ICD-10-CM | POA: Diagnosis not present

## 2023-08-30 DIAGNOSIS — M47817 Spondylosis without myelopathy or radiculopathy, lumbosacral region: Secondary | ICD-10-CM | POA: Diagnosis not present

## 2023-08-30 DIAGNOSIS — G5601 Carpal tunnel syndrome, right upper limb: Secondary | ICD-10-CM | POA: Diagnosis not present

## 2023-09-06 DIAGNOSIS — I10 Essential (primary) hypertension: Secondary | ICD-10-CM | POA: Diagnosis not present

## 2023-09-06 DIAGNOSIS — I5042 Chronic combined systolic (congestive) and diastolic (congestive) heart failure: Secondary | ICD-10-CM | POA: Diagnosis not present

## 2023-09-21 DIAGNOSIS — G894 Chronic pain syndrome: Secondary | ICD-10-CM | POA: Diagnosis not present

## 2023-09-21 DIAGNOSIS — F5101 Primary insomnia: Secondary | ICD-10-CM | POA: Diagnosis not present

## 2023-09-27 DIAGNOSIS — G894 Chronic pain syndrome: Secondary | ICD-10-CM | POA: Diagnosis not present

## 2023-09-27 DIAGNOSIS — M62830 Muscle spasm of back: Secondary | ICD-10-CM | POA: Diagnosis not present

## 2023-09-27 DIAGNOSIS — G5601 Carpal tunnel syndrome, right upper limb: Secondary | ICD-10-CM | POA: Diagnosis not present

## 2023-09-27 DIAGNOSIS — M47817 Spondylosis without myelopathy or radiculopathy, lumbosacral region: Secondary | ICD-10-CM | POA: Diagnosis not present

## 2023-10-11 DIAGNOSIS — I10 Essential (primary) hypertension: Secondary | ICD-10-CM | POA: Diagnosis not present

## 2023-10-11 DIAGNOSIS — I5042 Chronic combined systolic (congestive) and diastolic (congestive) heart failure: Secondary | ICD-10-CM | POA: Diagnosis not present

## 2023-10-26 DIAGNOSIS — M47817 Spondylosis without myelopathy or radiculopathy, lumbosacral region: Secondary | ICD-10-CM | POA: Diagnosis not present

## 2023-10-26 DIAGNOSIS — M62830 Muscle spasm of back: Secondary | ICD-10-CM | POA: Diagnosis not present

## 2023-10-26 DIAGNOSIS — G894 Chronic pain syndrome: Secondary | ICD-10-CM | POA: Diagnosis not present

## 2023-10-26 DIAGNOSIS — G5601 Carpal tunnel syndrome, right upper limb: Secondary | ICD-10-CM | POA: Diagnosis not present

## 2023-11-07 DIAGNOSIS — F5101 Primary insomnia: Secondary | ICD-10-CM | POA: Diagnosis not present

## 2023-11-07 DIAGNOSIS — G894 Chronic pain syndrome: Secondary | ICD-10-CM | POA: Diagnosis not present

## 2023-11-08 DIAGNOSIS — Z1389 Encounter for screening for other disorder: Secondary | ICD-10-CM | POA: Diagnosis not present

## 2023-11-08 DIAGNOSIS — Z0001 Encounter for general adult medical examination with abnormal findings: Secondary | ICD-10-CM | POA: Diagnosis not present

## 2023-11-08 DIAGNOSIS — Z23 Encounter for immunization: Secondary | ICD-10-CM | POA: Diagnosis not present

## 2023-11-08 DIAGNOSIS — E039 Hypothyroidism, unspecified: Secondary | ICD-10-CM | POA: Diagnosis not present

## 2023-11-08 DIAGNOSIS — G894 Chronic pain syndrome: Secondary | ICD-10-CM | POA: Diagnosis not present

## 2023-11-08 DIAGNOSIS — I25708 Atherosclerosis of coronary artery bypass graft(s), unspecified, with other forms of angina pectoris: Secondary | ICD-10-CM | POA: Diagnosis not present

## 2023-11-08 DIAGNOSIS — I5042 Chronic combined systolic (congestive) and diastolic (congestive) heart failure: Secondary | ICD-10-CM | POA: Diagnosis not present

## 2023-11-08 DIAGNOSIS — I10 Essential (primary) hypertension: Secondary | ICD-10-CM | POA: Diagnosis not present

## 2023-11-09 ENCOUNTER — Encounter (INDEPENDENT_AMBULATORY_CARE_PROVIDER_SITE_OTHER): Payer: Self-pay | Admitting: *Deleted

## 2023-11-21 DIAGNOSIS — M47817 Spondylosis without myelopathy or radiculopathy, lumbosacral region: Secondary | ICD-10-CM | POA: Diagnosis not present

## 2023-11-21 DIAGNOSIS — G5601 Carpal tunnel syndrome, right upper limb: Secondary | ICD-10-CM | POA: Diagnosis not present

## 2023-11-21 DIAGNOSIS — M62830 Muscle spasm of back: Secondary | ICD-10-CM | POA: Diagnosis not present

## 2023-11-21 DIAGNOSIS — G894 Chronic pain syndrome: Secondary | ICD-10-CM | POA: Diagnosis not present

## 2023-11-28 DIAGNOSIS — E039 Hypothyroidism, unspecified: Secondary | ICD-10-CM | POA: Diagnosis not present

## 2023-11-28 DIAGNOSIS — Z0001 Encounter for general adult medical examination with abnormal findings: Secondary | ICD-10-CM | POA: Diagnosis not present

## 2023-11-28 DIAGNOSIS — I10 Essential (primary) hypertension: Secondary | ICD-10-CM | POA: Diagnosis not present

## 2023-12-07 DIAGNOSIS — F5101 Primary insomnia: Secondary | ICD-10-CM | POA: Diagnosis not present

## 2023-12-07 DIAGNOSIS — G894 Chronic pain syndrome: Secondary | ICD-10-CM | POA: Diagnosis not present

## 2023-12-09 DIAGNOSIS — I10 Essential (primary) hypertension: Secondary | ICD-10-CM | POA: Diagnosis not present

## 2023-12-09 DIAGNOSIS — I5042 Chronic combined systolic (congestive) and diastolic (congestive) heart failure: Secondary | ICD-10-CM | POA: Diagnosis not present

## 2023-12-19 DIAGNOSIS — M47817 Spondylosis without myelopathy or radiculopathy, lumbosacral region: Secondary | ICD-10-CM | POA: Diagnosis not present

## 2023-12-19 DIAGNOSIS — G5601 Carpal tunnel syndrome, right upper limb: Secondary | ICD-10-CM | POA: Diagnosis not present

## 2023-12-19 DIAGNOSIS — G894 Chronic pain syndrome: Secondary | ICD-10-CM | POA: Diagnosis not present

## 2023-12-19 DIAGNOSIS — M62830 Muscle spasm of back: Secondary | ICD-10-CM | POA: Diagnosis not present

## 2024-01-01 DIAGNOSIS — F5101 Primary insomnia: Secondary | ICD-10-CM | POA: Diagnosis not present

## 2024-01-01 DIAGNOSIS — G894 Chronic pain syndrome: Secondary | ICD-10-CM | POA: Diagnosis not present

## 2024-01-09 DIAGNOSIS — I10 Essential (primary) hypertension: Secondary | ICD-10-CM | POA: Diagnosis not present

## 2024-01-09 DIAGNOSIS — I5042 Chronic combined systolic (congestive) and diastolic (congestive) heart failure: Secondary | ICD-10-CM | POA: Diagnosis not present

## 2024-01-15 DIAGNOSIS — M47817 Spondylosis without myelopathy or radiculopathy, lumbosacral region: Secondary | ICD-10-CM | POA: Diagnosis not present

## 2024-01-15 DIAGNOSIS — M62830 Muscle spasm of back: Secondary | ICD-10-CM | POA: Diagnosis not present

## 2024-01-15 DIAGNOSIS — G894 Chronic pain syndrome: Secondary | ICD-10-CM | POA: Diagnosis not present

## 2024-01-15 DIAGNOSIS — G5601 Carpal tunnel syndrome, right upper limb: Secondary | ICD-10-CM | POA: Diagnosis not present

## 2024-01-30 DIAGNOSIS — R052 Subacute cough: Secondary | ICD-10-CM | POA: Diagnosis not present

## 2024-01-30 DIAGNOSIS — I10 Essential (primary) hypertension: Secondary | ICD-10-CM | POA: Diagnosis not present

## 2024-01-30 DIAGNOSIS — J029 Acute pharyngitis, unspecified: Secondary | ICD-10-CM | POA: Diagnosis not present

## 2024-02-01 DIAGNOSIS — F5101 Primary insomnia: Secondary | ICD-10-CM | POA: Diagnosis not present

## 2024-02-01 DIAGNOSIS — G894 Chronic pain syndrome: Secondary | ICD-10-CM | POA: Diagnosis not present

## 2024-02-14 DIAGNOSIS — M47817 Spondylosis without myelopathy or radiculopathy, lumbosacral region: Secondary | ICD-10-CM | POA: Diagnosis not present

## 2024-02-14 DIAGNOSIS — G894 Chronic pain syndrome: Secondary | ICD-10-CM | POA: Diagnosis not present

## 2024-02-14 DIAGNOSIS — M62830 Muscle spasm of back: Secondary | ICD-10-CM | POA: Diagnosis not present

## 2024-02-14 DIAGNOSIS — G5601 Carpal tunnel syndrome, right upper limb: Secondary | ICD-10-CM | POA: Diagnosis not present

## 2024-02-29 DIAGNOSIS — G894 Chronic pain syndrome: Secondary | ICD-10-CM | POA: Diagnosis not present

## 2024-02-29 DIAGNOSIS — F5101 Primary insomnia: Secondary | ICD-10-CM | POA: Diagnosis not present

## 2024-03-04 ENCOUNTER — Ambulatory Visit (HOSPITAL_COMMUNITY)
Admission: RE | Admit: 2024-03-04 | Discharge: 2024-03-04 | Disposition: A | Discharge status: Home / Self Care | Source: Ambulatory Visit | Attending: Gerontology | Admitting: Gerontology

## 2024-03-04 ENCOUNTER — Other Ambulatory Visit (HOSPITAL_COMMUNITY): Payer: Self-pay | Admitting: Gerontology

## 2024-03-04 DIAGNOSIS — R059 Cough, unspecified: Secondary | ICD-10-CM | POA: Diagnosis not present

## 2024-03-04 DIAGNOSIS — J40 Bronchitis, not specified as acute or chronic: Secondary | ICD-10-CM | POA: Diagnosis not present

## 2024-03-04 DIAGNOSIS — R918 Other nonspecific abnormal finding of lung field: Secondary | ICD-10-CM | POA: Diagnosis not present

## 2024-03-04 DIAGNOSIS — I10 Essential (primary) hypertension: Secondary | ICD-10-CM | POA: Diagnosis not present

## 2024-03-05 DIAGNOSIS — R052 Subacute cough: Secondary | ICD-10-CM | POA: Diagnosis not present

## 2024-03-05 DIAGNOSIS — I10 Essential (primary) hypertension: Secondary | ICD-10-CM | POA: Diagnosis not present

## 2024-03-05 DIAGNOSIS — J40 Bronchitis, not specified as acute or chronic: Secondary | ICD-10-CM | POA: Diagnosis not present

## 2024-03-13 DIAGNOSIS — M62830 Muscle spasm of back: Secondary | ICD-10-CM | POA: Diagnosis not present

## 2024-03-13 DIAGNOSIS — G5601 Carpal tunnel syndrome, right upper limb: Secondary | ICD-10-CM | POA: Diagnosis not present

## 2024-03-13 DIAGNOSIS — M47817 Spondylosis without myelopathy or radiculopathy, lumbosacral region: Secondary | ICD-10-CM | POA: Diagnosis not present

## 2024-03-13 DIAGNOSIS — G894 Chronic pain syndrome: Secondary | ICD-10-CM | POA: Diagnosis not present

## 2024-03-28 DIAGNOSIS — F5101 Primary insomnia: Secondary | ICD-10-CM | POA: Diagnosis not present

## 2024-04-04 DIAGNOSIS — I5042 Chronic combined systolic (congestive) and diastolic (congestive) heart failure: Secondary | ICD-10-CM | POA: Diagnosis not present

## 2024-04-04 DIAGNOSIS — I10 Essential (primary) hypertension: Secondary | ICD-10-CM | POA: Diagnosis not present

## 2024-04-11 DIAGNOSIS — M47817 Spondylosis without myelopathy or radiculopathy, lumbosacral region: Secondary | ICD-10-CM | POA: Diagnosis not present

## 2024-04-11 DIAGNOSIS — M62830 Muscle spasm of back: Secondary | ICD-10-CM | POA: Diagnosis not present

## 2024-04-11 DIAGNOSIS — G5601 Carpal tunnel syndrome, right upper limb: Secondary | ICD-10-CM | POA: Diagnosis not present

## 2024-04-11 DIAGNOSIS — G894 Chronic pain syndrome: Secondary | ICD-10-CM | POA: Diagnosis not present

## 2024-04-11 DIAGNOSIS — Z79891 Long term (current) use of opiate analgesic: Secondary | ICD-10-CM | POA: Diagnosis not present

## 2024-04-25 DIAGNOSIS — G894 Chronic pain syndrome: Secondary | ICD-10-CM | POA: Diagnosis not present

## 2024-04-25 DIAGNOSIS — F5101 Primary insomnia: Secondary | ICD-10-CM | POA: Diagnosis not present

## 2024-05-02 DIAGNOSIS — I10 Essential (primary) hypertension: Secondary | ICD-10-CM | POA: Diagnosis not present

## 2024-05-02 DIAGNOSIS — I5042 Chronic combined systolic (congestive) and diastolic (congestive) heart failure: Secondary | ICD-10-CM | POA: Diagnosis not present

## 2024-05-02 DIAGNOSIS — L02223 Furuncle of chest wall: Secondary | ICD-10-CM | POA: Diagnosis not present

## 2024-05-09 DIAGNOSIS — M47817 Spondylosis without myelopathy or radiculopathy, lumbosacral region: Secondary | ICD-10-CM | POA: Diagnosis not present

## 2024-05-09 DIAGNOSIS — G5601 Carpal tunnel syndrome, right upper limb: Secondary | ICD-10-CM | POA: Diagnosis not present

## 2024-05-09 DIAGNOSIS — G894 Chronic pain syndrome: Secondary | ICD-10-CM | POA: Diagnosis not present

## 2024-05-09 DIAGNOSIS — M62830 Muscle spasm of back: Secondary | ICD-10-CM | POA: Diagnosis not present

## 2024-05-15 ENCOUNTER — Encounter (INDEPENDENT_AMBULATORY_CARE_PROVIDER_SITE_OTHER): Payer: Self-pay | Admitting: *Deleted

## 2024-05-23 DIAGNOSIS — F5101 Primary insomnia: Secondary | ICD-10-CM | POA: Diagnosis not present

## 2024-05-23 DIAGNOSIS — G894 Chronic pain syndrome: Secondary | ICD-10-CM | POA: Diagnosis not present

## 2024-06-01 DIAGNOSIS — I10 Essential (primary) hypertension: Secondary | ICD-10-CM | POA: Diagnosis not present

## 2024-06-01 DIAGNOSIS — I5042 Chronic combined systolic (congestive) and diastolic (congestive) heart failure: Secondary | ICD-10-CM | POA: Diagnosis not present

## 2024-06-04 DIAGNOSIS — G5601 Carpal tunnel syndrome, right upper limb: Secondary | ICD-10-CM | POA: Diagnosis not present

## 2024-06-04 DIAGNOSIS — M47817 Spondylosis without myelopathy or radiculopathy, lumbosacral region: Secondary | ICD-10-CM | POA: Diagnosis not present

## 2024-06-04 DIAGNOSIS — M62830 Muscle spasm of back: Secondary | ICD-10-CM | POA: Diagnosis not present

## 2024-06-04 DIAGNOSIS — G894 Chronic pain syndrome: Secondary | ICD-10-CM | POA: Diagnosis not present

## 2024-06-20 DIAGNOSIS — G894 Chronic pain syndrome: Secondary | ICD-10-CM | POA: Diagnosis not present

## 2024-06-20 DIAGNOSIS — F5101 Primary insomnia: Secondary | ICD-10-CM | POA: Diagnosis not present

## 2024-07-02 DIAGNOSIS — I10 Essential (primary) hypertension: Secondary | ICD-10-CM | POA: Diagnosis not present

## 2024-07-02 DIAGNOSIS — I5042 Chronic combined systolic (congestive) and diastolic (congestive) heart failure: Secondary | ICD-10-CM | POA: Diagnosis not present

## 2024-07-03 DIAGNOSIS — M62830 Muscle spasm of back: Secondary | ICD-10-CM | POA: Diagnosis not present

## 2024-07-03 DIAGNOSIS — G894 Chronic pain syndrome: Secondary | ICD-10-CM | POA: Diagnosis not present

## 2024-07-03 DIAGNOSIS — G5601 Carpal tunnel syndrome, right upper limb: Secondary | ICD-10-CM | POA: Diagnosis not present

## 2024-07-03 DIAGNOSIS — M47817 Spondylosis without myelopathy or radiculopathy, lumbosacral region: Secondary | ICD-10-CM | POA: Diagnosis not present

## 2024-07-14 DIAGNOSIS — G894 Chronic pain syndrome: Secondary | ICD-10-CM | POA: Diagnosis not present

## 2024-07-14 DIAGNOSIS — F5101 Primary insomnia: Secondary | ICD-10-CM | POA: Diagnosis not present

## 2024-07-31 DIAGNOSIS — Z23 Encounter for immunization: Secondary | ICD-10-CM | POA: Diagnosis not present

## 2024-08-01 DIAGNOSIS — G5601 Carpal tunnel syndrome, right upper limb: Secondary | ICD-10-CM | POA: Diagnosis not present

## 2024-08-01 DIAGNOSIS — M47817 Spondylosis without myelopathy or radiculopathy, lumbosacral region: Secondary | ICD-10-CM | POA: Diagnosis not present

## 2024-08-01 DIAGNOSIS — M62830 Muscle spasm of back: Secondary | ICD-10-CM | POA: Diagnosis not present

## 2024-08-01 DIAGNOSIS — G894 Chronic pain syndrome: Secondary | ICD-10-CM | POA: Diagnosis not present

## 2024-08-02 DIAGNOSIS — I5042 Chronic combined systolic (congestive) and diastolic (congestive) heart failure: Secondary | ICD-10-CM | POA: Diagnosis not present

## 2024-08-02 DIAGNOSIS — I10 Essential (primary) hypertension: Secondary | ICD-10-CM | POA: Diagnosis not present

## 2024-08-15 DIAGNOSIS — G894 Chronic pain syndrome: Secondary | ICD-10-CM | POA: Diagnosis not present

## 2024-08-15 DIAGNOSIS — F5101 Primary insomnia: Secondary | ICD-10-CM | POA: Diagnosis not present
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
## 2024-08-26 ENCOUNTER — Encounter: Payer: Self-pay | Admitting: General Practice

## 2024-08-26 ENCOUNTER — Ambulatory Visit: Attending: General Practice | Admitting: General Practice

## 2024-08-26 VITALS — BP 106/64 | HR 71 | Ht 67.0 in | Wt 192.0 lb

## 2024-08-26 DIAGNOSIS — I2510 Atherosclerotic heart disease of native coronary artery without angina pectoris: Secondary | ICD-10-CM | POA: Diagnosis not present

## 2024-08-26 DIAGNOSIS — I480 Paroxysmal atrial fibrillation: Secondary | ICD-10-CM | POA: Diagnosis not present

## 2024-08-26 DIAGNOSIS — I5042 Chronic combined systolic (congestive) and diastolic (congestive) heart failure: Secondary | ICD-10-CM | POA: Diagnosis not present

## 2024-08-26 DIAGNOSIS — E782 Mixed hyperlipidemia: Secondary | ICD-10-CM

## 2024-08-26 DIAGNOSIS — I10 Essential (primary) hypertension: Secondary | ICD-10-CM | POA: Diagnosis not present

## 2024-08-26 NOTE — Patient Instructions (Addendum)
 Thank you for choosing [facility]!     Medication Instructions:  No medication changes were made during today's visit.    *If you need a refill on your cardiac medications before your next appointment, please call your pharmacy*   Lab Work: No labs were ordered during today's visit. Please request your labs from your primary care provider. If you have labs (blood work) drawn today and your tests are completely normal, you will receive your results only by: MyChart Message (if you have MyChart) OR A paper copy in the mail If you have any lab test that is abnormal or we need to change your treatment, we will call you to review the results.   Testing/Procedures: No procedures were ordered during today's visit.   Your next appointment:   9-12 months   Provider:   Maude Emmer, MD or Josefa Beauvais     Follow-Up: At [facility], you and your health needs are our priority.  As part of our continuing mission to provide you with exceptional heart care, we have created designated Provider Care Teams.  These Care Teams include your primary Cardiologist (physician) and Advanced Practice Providers (APPs -  Physician Assistants and Nurse Practitioners) who all work together to provide you with the care you need, when you need it. We recommend signing up for the patient portal called MyChart.  Sign up information is provided on this After Visit Summary.  MyChart is used to connect with patients for Virtual Visits (Telemedicine).  Patients are able to view lab/test results, encounter notes, upcoming appointments, etc.  Non-urgent messages can be sent to your provider as well.   To learn more about what you can do with MyChart, go to [email or web address].

## 2024-09-01 DIAGNOSIS — I10 Essential (primary) hypertension: Secondary | ICD-10-CM | POA: Diagnosis not present

## 2024-09-01 DIAGNOSIS — I5042 Chronic combined systolic (congestive) and diastolic (congestive) heart failure: Secondary | ICD-10-CM | POA: Diagnosis not present

## 2024-09-02 ENCOUNTER — Telehealth: Payer: Self-pay | Admitting: *Deleted

## 2024-09-02 NOTE — Telephone Encounter (Signed)
 Fax diet order over per Dr. Delford. Called facility to see if faxed was received. Spoke to [facility] and faxed confirmed as received.

## 2024-09-08 DIAGNOSIS — F5101 Primary insomnia: Secondary | ICD-10-CM | POA: Diagnosis not present

## 2024-09-08 DIAGNOSIS — G894 Chronic pain syndrome: Secondary | ICD-10-CM | POA: Diagnosis not present
